# Patient Record
Sex: Male | Born: 1940 | Race: White | Hispanic: No | Marital: Married | Smoking: Never smoker
Health system: Southern US, Community
[De-identification: ages and names within clinical notes are randomized; demographics above are authoritative.]

## PROBLEM LIST (undated history)

## (undated) DIAGNOSIS — I480 Paroxysmal atrial fibrillation: Secondary | ICD-10-CM

## (undated) DIAGNOSIS — K219 Gastro-esophageal reflux disease without esophagitis: Secondary | ICD-10-CM

## (undated) DIAGNOSIS — C44300 Unspecified malignant neoplasm of skin of unspecified part of face: Secondary | ICD-10-CM

## (undated) DIAGNOSIS — M1990 Unspecified osteoarthritis, unspecified site: Secondary | ICD-10-CM

## (undated) DIAGNOSIS — F419 Anxiety disorder, unspecified: Secondary | ICD-10-CM

## (undated) DIAGNOSIS — R296 Repeated falls: Secondary | ICD-10-CM

## (undated) DIAGNOSIS — K259 Gastric ulcer, unspecified as acute or chronic, without hemorrhage or perforation: Secondary | ICD-10-CM

## (undated) DIAGNOSIS — Z8619 Personal history of other infectious and parasitic diseases: Secondary | ICD-10-CM

## (undated) DIAGNOSIS — Z7901 Long term (current) use of anticoagulants: Secondary | ICD-10-CM

## (undated) DIAGNOSIS — I10 Essential (primary) hypertension: Secondary | ICD-10-CM

## (undated) DIAGNOSIS — I4510 Unspecified right bundle-branch block: Secondary | ICD-10-CM

## (undated) DIAGNOSIS — K759 Inflammatory liver disease, unspecified: Secondary | ICD-10-CM

## (undated) DIAGNOSIS — E785 Hyperlipidemia, unspecified: Secondary | ICD-10-CM

## (undated) DIAGNOSIS — N6009 Solitary cyst of unspecified breast: Secondary | ICD-10-CM

## (undated) DIAGNOSIS — I700 Atherosclerosis of aorta: Secondary | ICD-10-CM

## (undated) DIAGNOSIS — M1611 Unilateral primary osteoarthritis, right hip: Secondary | ICD-10-CM

## (undated) DIAGNOSIS — R001 Bradycardia, unspecified: Secondary | ICD-10-CM

## (undated) DIAGNOSIS — I495 Sick sinus syndrome: Secondary | ICD-10-CM

## (undated) HISTORY — PX: TONSILLECTOMY: SUR1361

## (undated) HISTORY — PX: COLON SURGERY: SHX602

---
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
## 1979-03-29 DIAGNOSIS — N6001 Solitary cyst of right breast: Secondary | ICD-10-CM

## 1979-03-29 DIAGNOSIS — N6009 Solitary cyst of unspecified breast: Secondary | ICD-10-CM

## 1979-03-29 HISTORY — DX: Solitary cyst of right breast: N60.01

## 1979-03-29 HISTORY — PX: BREAST EXCISIONAL BIOPSY: SUR124

## 1979-03-29 HISTORY — DX: Solitary cyst of unspecified breast: N60.09

## 1986-03-28 DIAGNOSIS — K759 Inflammatory liver disease, unspecified: Secondary | ICD-10-CM

## 1986-03-28 HISTORY — DX: Inflammatory liver disease, unspecified: K75.9

## 2014-02-12 ENCOUNTER — Ambulatory Visit: Payer: Self-pay | Admitting: Orthopedic Surgery

## 2014-02-12 LAB — BASIC METABOLIC PANEL
Anion Gap: 6 — ABNORMAL LOW (ref 7–16)
BUN: 19 mg/dL — ABNORMAL HIGH (ref 7–18)
CHLORIDE: 100 mmol/L (ref 98–107)
CREATININE: 0.92 mg/dL (ref 0.60–1.30)
Calcium, Total: 9.3 mg/dL (ref 8.5–10.1)
Co2: 34 mmol/L — ABNORMAL HIGH (ref 21–32)
EGFR (Non-African Amer.): >60
GLUCOSE: 137 mg/dL — AB (ref 65–99)
Osmolality: 284 (ref 275–301)
POTASSIUM: 3.8 mmol/L (ref 3.5–5.1)
Sodium: 140 mmol/L (ref 136–145)

## 2014-02-12 LAB — CBC
HCT: 47.1 % (ref 40.0–52.0)
HGB: 15.7 g/dL (ref 13.0–18.0)
MCH: 32.5 pg (ref 26.0–34.0)
MCHC: 33.3 g/dL (ref 32.0–36.0)
MCV: 97 fL (ref 80–100)
Platelet: 229 10*3/uL (ref 150–440)
RBC: 4.84 10*6/uL (ref 4.40–5.90)
RDW: 12.8 % (ref 11.5–14.5)
WBC: 9 10*3/uL (ref 3.8–10.6)

## 2014-02-12 LAB — URINALYSIS, COMPLETE
Bilirubin,UR: NEGATIVE
Blood: NEGATIVE
GLUCOSE, UR: NEGATIVE mg/dL (ref 0–75)
Ketone: NEGATIVE
LEUKOCYTE ESTERASE: NEGATIVE
NITRITE: NEGATIVE
PH: 6 (ref 4.5–8.0)
Protein: 30
RBC,UR: 2 /HPF (ref 0–5)
SPECIFIC GRAVITY: 1.018 (ref 1.003–1.030)

## 2014-02-12 LAB — PROTIME-INR
INR: 1
PROTHROMBIN TIME: 13.2 s (ref 11.5–14.7)

## 2014-02-12 LAB — APTT: ACTIVATED PTT: 29.4 s (ref 23.6–35.9)

## 2014-02-12 LAB — MRSA PCR SCREENING

## 2014-02-12 LAB — SEDIMENTATION RATE: Erythrocyte Sed Rate: 6 mm/hr (ref 0–20)

## 2014-02-27 ENCOUNTER — Inpatient Hospital Stay: Payer: Self-pay | Admitting: Orthopedic Surgery

## 2014-02-27 HISTORY — PX: TOTAL HIP ARTHROPLASTY: SHX124

## 2014-02-27 IMAGING — CR DG HIP COMPLETE 2+V*L*
1 series · 1 of 1 positions shown · non-contrast
Comparison: None.

CLINICAL DATA: Status post left hip arthroplasty.

EXAM:
LEFT HIP - COMPLETE 2+ VIEW

[ap]
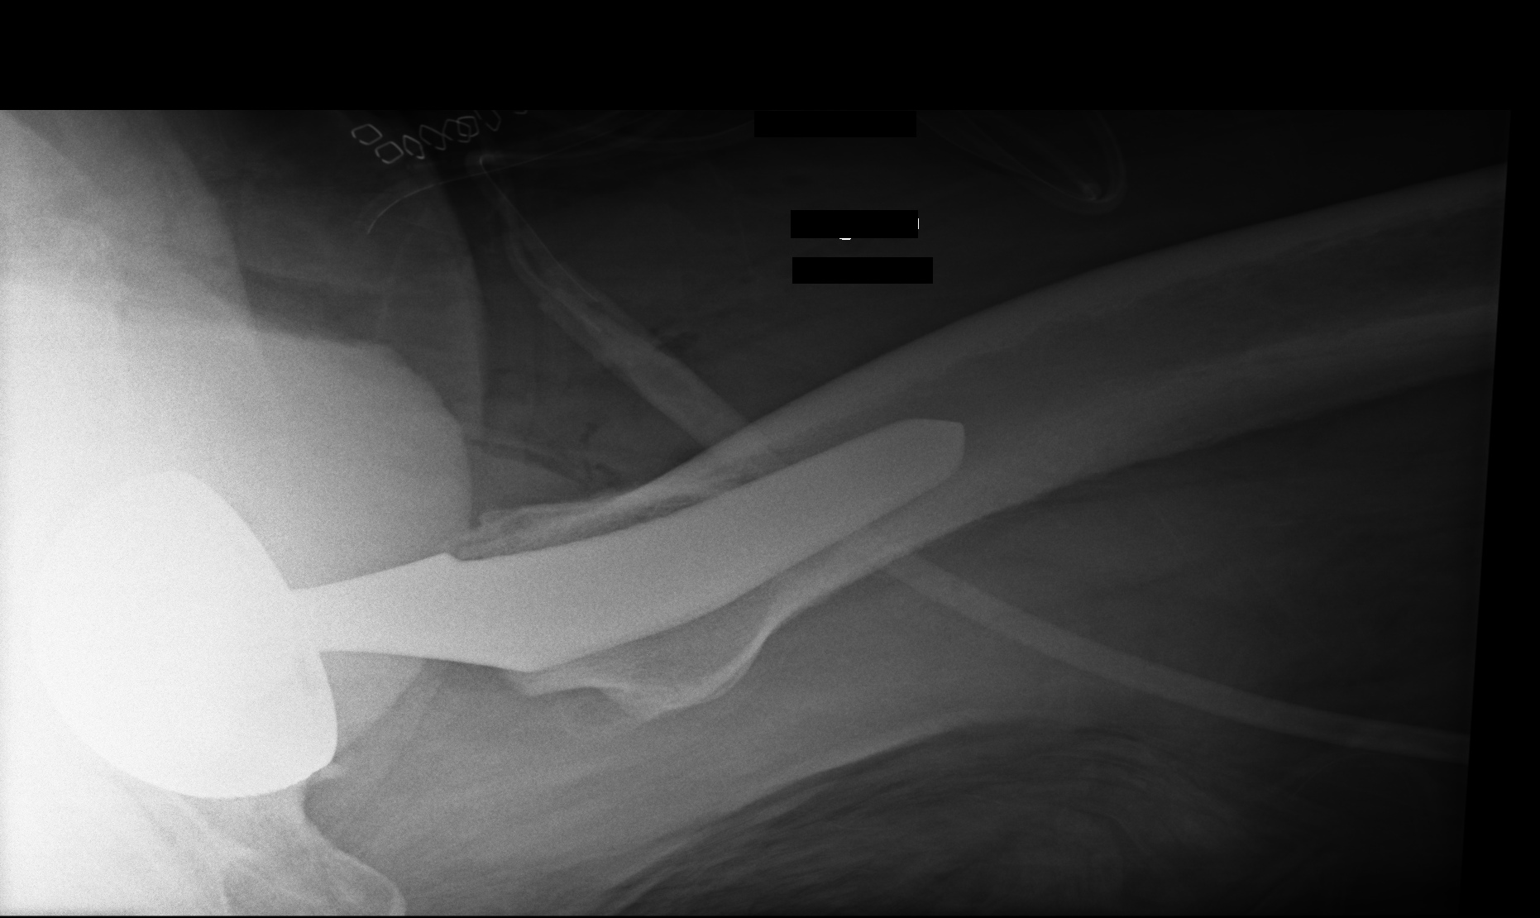

[1 of 1 positions shown; findings below may reference images not displayed]

FINDINGS: Left hip prosthesis is seen in appropriate position. No evidence of
fracture or dislocation. Overlying surgical drain and skin staples
noted.
IMPRESSION: Expected appearance of left prosthesis. No evidence of fracture or
dislocation.

## 2014-02-28 LAB — BASIC METABOLIC PANEL
Anion Gap: 7 (ref 7–16)
BUN: 15 mg/dL (ref 7–18)
CALCIUM: 7.8 mg/dL — AB (ref 8.5–10.1)
CHLORIDE: 101 mmol/L (ref 98–107)
Co2: 26 mmol/L (ref 21–32)
Creatinine: 1 mg/dL (ref 0.60–1.30)
EGFR (African American): >60
EGFR (Non-African Amer.): >60
Glucose: 137 mg/dL — ABNORMAL HIGH (ref 65–99)
Osmolality: 271 (ref 275–301)
Potassium: 4.1 mmol/L (ref 3.5–5.1)
Sodium: 134 mmol/L — ABNORMAL LOW (ref 136–145)

## 2014-02-28 LAB — HEMOGLOBIN: HGB: 12.5 g/dL — ABNORMAL LOW (ref 13.0–18.0)

## 2014-02-28 LAB — PLATELET COUNT: PLATELETS: 182 10*3/uL (ref 150–440)

## 2014-07-19 NOTE — Op Note (Signed)
PATIENT NAME:  Steven Welch, Steven Welch MR#:  [id number] DATE OF BIRTH:  [birth year]  DATE OF PROCEDURE:  02/27/2014  PREOPERATIVE DIAGNOSIS: Severe left hip osteoarthritis.   POSTOPERATIVE DIAGNOSIS: Severe left hip osteoarthritis.   PROCEDURE: Left total hip replacement, direct anterior approach.   ANESTHESIA: Spinal.   SURGEON: Laurene Footman, MD.  DESCRIPTION OF PROCEDURE: The patient was brought to the Operating Room and after spinal anesthesia was obtained, the patient was placed on the operative table with the right leg on a well-padded table, left leg on the left foot in the Medacta foot  attachment. After prepping and draping in the usual sterile fashion and the C-arm being brought in to get an initial template x-ray, the appropriate timeout and patient identification procedures completed, a direct anterior approach was made centered over the greater trochanter and tensor fascia lata muscle.  After incising down through the skin and subcutaneous tissue, the tensor fascia was incised and the muscle belly retracted laterally. The deep fascia was incised and the lateral femoral circumflex vessels ligated. The anterior capsule was exposed and then an anterior capsulotomy was performed with retractors placed. The femoral neck cut was carried out and the head was noted to be quite deformed with a large spur medially, as well as sclerotic bone on both the femoral head and extensive sclerosis of the acetabulum.   Next, the acetabulum was sequentially reamed with quite large head, 60 mm reaming.  Good bleeding bone was obtained at 60 mm and the trial fit well; 60 mm Versafitcup DM was impacted into place. Next, the leg was externally rotated and dropped into extension after partial ischiofemoral and pubofemoral releases. Sequential broaching was carried up to a size 5 . The size 5 gave good fill of the canal and appeared appropriate. Next, trial was performed with the trial neck and head with the appropriate size  being obtained. The final components were inserted with a size 5 stem, an L28 head and the appropriate liner for the 60 mm cup. The hip was reduced and was stable through a range of motion.  Soft tissue tension appeared appropriate and it was very stable to external rotation, stability testing.  The hip was thoroughly irrigated. Then, 30 mL of 0.25% Sensorcaine with epinephrine infiltrated in the periarticular tissue to aid in postoperative analgesia. The deep fascia was repaired using a heavy quill running suture. A subcutaneous drain placed with 2-0 quill subcutaneously and skin staples. Xeroform, 4 x 4's, ABD and tape applied. The patient was sent to the recovery room in stable condition.   ESTIMATED BLOOD LOSS: 400 mL.   COMPLICATIONS: None.   SPECIMEN: Removed femoral head.   IMPLANTS:  Medacta AMIS size 5 stem with a 60 mm Versafitcup cup DM with liner and L28 mm head.    ____________________________ Laurene Footman, MD mjm:by D: 02/27/2014 12:48:45 ET T: 02/27/2014 14:10:59 ET JOB#: [id number]  cc: Laurene Footman, MD, <Dictator> Laurene Footman MD ELECTRONICALLY SIGNED 02/27/2014 17:09

## 2014-07-19 NOTE — Discharge Summary (Signed)
PATIENT NAME:  Steven Welch, Steven Welch MR#:  [id number] DATE OF BIRTH:  [birth year]  DATE OF ADMISSION:  02/27/2014 DATE OF DISCHARGE:  03/02/2014  ADMITTING DIAGNOSIS: Severe left hip osteoarthritis.   DISCHARGE DIAGNOSIS:  Severe left hip osteoarthritis.   OPERATION: On 02/27/2014, the patient had a left total hip replacement with an anterior approach.   ANESTHESIA: Spinal.   SURGEON: Laurene Footman, MD  ESTIMATED BLOOD LOSS: 400 mL.   COMPLICATIONS: None.   IMPLANTS USED: Medacta AMIS size 5 stem with a 60 mm Versafit cup with DM and a liner with L 28 mm head. The patient was stabilized, brought to the recovery room, and then brought down to the orthopedic floor.   HISTORY AND PHYSICAL: The patient is a 74 year old male, who presented to the clinic with persistent left hip pain. The patient has been refractory to conservative treatment with activity modification and anti-inflammatories. The patient has been using a cane.   PHYSICAL EXAMINATION:  GENERAL: Alert male with severe limp and slow gait. The patient has range of motion that is limited with hip flexion, as well as internal and external rotation secondary to pain. The patient has tenderness in the anterior aspect of the hip joint with good quadriceps control.   HOSPITAL COURSE: After initial admission on February 27, 2014, the patient was brought to the orthopedic floor.  On postoperative day 1, the patient had a hemoglobin of 12.5 and remained stable there. The patient worked with physical therapy initially bed to chair and progressed up to ambulating about 250 feet, including doing stairs. The patient was ready to go home with home health physical therapy on March 02, 2014.   CONDITION AT DISCHARGE: Stable.   DISPOSITION: The patient was sent home with home health physical therapy.   DISCHARGE INSTRUCTIONS:  The patient will follow up with [facility] orthopedics in 2 weeks for staple removal. The patient is to weight bear as  tolerated. The patient will elevate his leg with 1 to 2 pillows, use knee-high TED hose on both legs, removed at bedtime. The patient will elevate his heels off the bed and use incentive spirometer and be encouraged to do cough and deep breathing. The patient will use a regular diet. The patient will keep his dressing on and have it changed with physical therapy and try not to get it wet or dirty. The patient will call the clinic if there is any bright red bleeding, calf pain, bowel or bladder difficulty, or any fever greater than 101.5. The patient will do physical therapy working on gait training and strengthening.   DISCHARGE MEDICATIONS: Resume home medications and then to begin oxycodone 5 mg 1 tablet every 4 hours as needed for severe pain, Tylenol Extra Strength 2 tablets every 6 hours as needed for pain or fever, and the Lovenox 40 mg subcutaneous once a day for 14 days and discontinue after 14 days and begin aspirin 81 mg once a day.     ____________________________ Lenna Sciara. Reche Dixon, [place] jtm:ap D: 03/02/2014 07:16:43 ET T: 03/02/2014 08:04:52 ET JOB#: [id number]  cc: J. Reche Dixon, [place], <Dictator> J Larcenia Holaday [facility] ELECTRONICALLY SIGNED 03/04/2014 9:58

## 2014-07-21 LAB — SURGICAL PATHOLOGY

## 2015-06-17 ENCOUNTER — Other Ambulatory Visit: Payer: Self-pay | Admitting: Family Medicine

## 2015-06-17 DIAGNOSIS — N6459 Other signs and symptoms in breast: Secondary | ICD-10-CM

## 2015-06-17 DIAGNOSIS — N649 Disorder of breast, unspecified: Secondary | ICD-10-CM

## 2015-07-03 ENCOUNTER — Ambulatory Visit
Admission: RE | Admit: 2015-07-03 | Discharge: 2015-07-03 | Disposition: A | Discharge status: Home / Self Care | Payer: Medicare Other | Source: Ambulatory Visit | Attending: Family Medicine | Admitting: Family Medicine

## 2015-07-03 DIAGNOSIS — N62 Hypertrophy of breast: Secondary | ICD-10-CM | POA: Insufficient documentation

## 2015-07-03 DIAGNOSIS — N649 Disorder of breast, unspecified: Secondary | ICD-10-CM | POA: Diagnosis present

## 2015-07-03 DIAGNOSIS — N6459 Other signs and symptoms in breast: Secondary | ICD-10-CM

## 2016-12-11 ENCOUNTER — Encounter: Payer: Self-pay | Admitting: *Deleted

## 2016-12-11 DIAGNOSIS — Z23 Encounter for immunization: Secondary | ICD-10-CM

## 2018-01-02 ENCOUNTER — Encounter: Payer: Self-pay | Admitting: *Deleted

## 2018-12-24 ENCOUNTER — Other Ambulatory Visit: Payer: Self-pay

## 2018-12-24 ENCOUNTER — Encounter
Admission: RE | Admit: 2018-12-24 | Discharge: 2018-12-24 | Disposition: A | Discharge status: Home / Self Care | Payer: Medicare Other | Source: Ambulatory Visit | Attending: Orthopedic Surgery | Admitting: Orthopedic Surgery

## 2018-12-24 DIAGNOSIS — I10 Essential (primary) hypertension: Secondary | ICD-10-CM | POA: Diagnosis not present

## 2018-12-24 DIAGNOSIS — Z01818 Encounter for other preprocedural examination: Secondary | ICD-10-CM | POA: Diagnosis not present

## 2018-12-24 DIAGNOSIS — I4510 Unspecified right bundle-branch block: Secondary | ICD-10-CM | POA: Insufficient documentation

## 2018-12-24 DIAGNOSIS — R9431 Abnormal electrocardiogram [ECG] [EKG]: Secondary | ICD-10-CM | POA: Diagnosis not present

## 2018-12-24 HISTORY — DX: Gastro-esophageal reflux disease without esophagitis: K21.9

## 2018-12-24 HISTORY — DX: Essential (primary) hypertension: I10

## 2018-12-24 HISTORY — DX: Unilateral primary osteoarthritis, right hip: M16.11

## 2018-12-24 HISTORY — DX: Personal history of other infectious and parasitic diseases: Z86.19

## 2018-12-24 HISTORY — DX: Unspecified malignant neoplasm of skin of unspecified part of face: C44.300

## 2018-12-24 HISTORY — DX: Hyperlipidemia, unspecified: E78.5

## 2018-12-24 LAB — TYPE AND SCREEN
ABO/RH(D): O POS
Antibody Screen: NEGATIVE

## 2018-12-24 LAB — SURGICAL PCR SCREEN
MRSA, PCR: NEGATIVE
Staphylococcus aureus: NEGATIVE

## 2018-12-24 NOTE — Patient Instructions (Signed)
Your procedure is scheduled on: Tuesday, January 01, 2019 Report to Day Surgery on the 2nd floor of the [facility]. To find out your arrival time, please call [phone] between 1PM - 3PM on: Monday, December 31, 2018  REMEMBER: Instructions that are not followed completely may result in serious medical risk, up to and including death; or upon the discretion of your surgeon and anesthesiologist your surgery may need to be rescheduled.  Do not eat food after midnight the night before surgery.  No gum chewing, lozengers or hard candies.  You may however, drink CLEAR liquids up to 2 hours before you are scheduled to arrive for your surgery. Do not drink anything within 2 hours of the start of your surgery.  Clear liquids include: - water  - apple juice without pulp - gatorade - black coffee or tea (Do NOT add milk or creamers to the coffee or tea) Do NOT drink anything that is not on this list.  No Alcohol for 24 hours before or after surgery.  No Smoking including e-cigarettes for 24 hours prior to surgery.   On the morning of surgery brush your teeth with toothpaste and water, you may rinse your mouth with mouthwash if you wish. Do not swallow any toothpaste or mouthwash.  Notify your doctor if there is any change in your medical condition (cold, fever, infection).  Do not wear jewelry, make-up, hairpins, clips or nail polish.  Do not wear lotions, powders, or perfumes.   Do not shave 48 hours prior to surgery.   Contacts and dentures may not be worn into surgery.  Do not bring valuables to the hospital, including drivers license, insurance or credit cards.  [facility] is not responsible for any belongings or valuables.   TAKE THESE MEDICATIONS THE MORNING OF SURGERY:  1.  Tylenol (if needed for pain) 2.  Amlodipine 3.  Prilosec - (take one the night before and one on the morning of surgery - helps to prevent nausea after surgery.)  Use CHG Soap as directed on  instruction sheet.  Starting September 29 - Stop ASPIRIN AND Anti-inflammatories (NSAIDS) such as Advil, Aleve, Ibuprofen, Motrin, Naproxen, Naprosyn and Aspirin based products such as Excedrin, Goodys Powder, BC Powder. (May take Tylenol or Acetaminophen if needed.)  Starting September 29 - Stop ANY OVER THE COUNTER supplements until after surgery.  Wear comfortable clothing (specific to your surgery type) to the hospital.  Please call [phone] if you have any questions about these instructions.

## 2018-12-24 NOTE — Progress Notes (Signed)
EKG reviewed by Dr. Rosey Bath (anesthesia). Patient having no cardiac symptoms. Ok to proceed to surgery.

## 2018-12-28 ENCOUNTER — Other Ambulatory Visit: Payer: Self-pay

## 2018-12-28 ENCOUNTER — Other Ambulatory Visit
Admission: RE | Admit: 2018-12-28 | Discharge: 2018-12-28 | Disposition: A | Discharge status: Home / Self Care | Payer: Medicare Other | Source: Ambulatory Visit | Attending: Orthopedic Surgery | Admitting: Orthopedic Surgery

## 2018-12-28 DIAGNOSIS — Z20828 Contact with and (suspected) exposure to other viral communicable diseases: Secondary | ICD-10-CM | POA: Diagnosis not present

## 2018-12-28 DIAGNOSIS — M1611 Unilateral primary osteoarthritis, right hip: Secondary | ICD-10-CM | POA: Insufficient documentation

## 2018-12-28 DIAGNOSIS — Z01812 Encounter for preprocedural laboratory examination: Secondary | ICD-10-CM | POA: Insufficient documentation

## 2018-12-28 LAB — SARS CORONAVIRUS 2 (TAT 6-24 HRS): SARS Coronavirus 2: NEGATIVE

## 2019-01-01 ENCOUNTER — Encounter
Admission: RE | Disposition: A | Discharge status: Home Health | Payer: Self-pay | Source: Home / Self Care | Attending: Orthopedic Surgery

## 2019-01-01 ENCOUNTER — Other Ambulatory Visit: Payer: Self-pay

## 2019-01-01 ENCOUNTER — Inpatient Hospital Stay: Payer: Medicare Other | Admitting: Anesthesiology

## 2019-01-01 ENCOUNTER — Inpatient Hospital Stay: Payer: Medicare Other

## 2019-01-01 ENCOUNTER — Inpatient Hospital Stay
Admission: RE | Admit: 2019-01-01 | Discharge: 2019-01-04 | DRG: 470 | Disposition: A | Discharge status: Home Health | Payer: Medicare Other | Attending: Orthopedic Surgery | Admitting: Orthopedic Surgery

## 2019-01-01 ENCOUNTER — Encounter: Payer: Self-pay | Admitting: *Deleted

## 2019-01-01 DIAGNOSIS — E785 Hyperlipidemia, unspecified: Secondary | ICD-10-CM | POA: Diagnosis present

## 2019-01-01 DIAGNOSIS — Z85828 Personal history of other malignant neoplasm of skin: Secondary | ICD-10-CM

## 2019-01-01 DIAGNOSIS — I10 Essential (primary) hypertension: Secondary | ICD-10-CM | POA: Diagnosis present

## 2019-01-01 DIAGNOSIS — Z8711 Personal history of peptic ulcer disease: Secondary | ICD-10-CM

## 2019-01-01 DIAGNOSIS — M1611 Unilateral primary osteoarthritis, right hip: Principal | ICD-10-CM | POA: Diagnosis present

## 2019-01-01 DIAGNOSIS — Z8249 Family history of ischemic heart disease and other diseases of the circulatory system: Secondary | ICD-10-CM

## 2019-01-01 DIAGNOSIS — K219 Gastro-esophageal reflux disease without esophagitis: Secondary | ICD-10-CM | POA: Diagnosis present

## 2019-01-01 DIAGNOSIS — Z8619 Personal history of other infectious and parasitic diseases: Secondary | ICD-10-CM | POA: Diagnosis not present

## 2019-01-01 DIAGNOSIS — Z79899 Other long term (current) drug therapy: Secondary | ICD-10-CM

## 2019-01-01 DIAGNOSIS — Z96641 Presence of right artificial hip joint: Secondary | ICD-10-CM

## 2019-01-01 DIAGNOSIS — Z791 Long term (current) use of non-steroidal anti-inflammatories (NSAID): Secondary | ICD-10-CM

## 2019-01-01 DIAGNOSIS — Z7951 Long term (current) use of inhaled steroids: Secondary | ICD-10-CM

## 2019-01-01 DIAGNOSIS — G8918 Other acute postprocedural pain: Secondary | ICD-10-CM

## 2019-01-01 DIAGNOSIS — Z419 Encounter for procedure for purposes other than remedying health state, unspecified: Secondary | ICD-10-CM

## 2019-01-01 HISTORY — PX: TOTAL HIP ARTHROPLASTY: SHX124

## 2019-01-01 LAB — ABO/RH: ABO/RH(D): O POS

## 2019-01-01 LAB — CBC
HCT: 38 % — ABNORMAL LOW (ref 39.0–52.0)
Hemoglobin: 12.9 g/dL — ABNORMAL LOW (ref 13.0–17.0)
MCH: 31.9 pg (ref 26.0–34.0)
MCHC: 33.9 g/dL (ref 30.0–36.0)
MCV: 93.8 fL (ref 80.0–100.0)
Platelets: 234 10*3/uL (ref 150–400)
RBC: 4.05 MIL/uL — ABNORMAL LOW (ref 4.22–5.81)
RDW: 12.3 % (ref 11.5–15.5)
WBC: 17.8 10*3/uL — ABNORMAL HIGH (ref 4.0–10.5)
nRBC: 0 % (ref 0.0–0.2)

## 2019-01-01 LAB — CREATININE, SERUM
Creatinine, Ser: 0.77 mg/dL (ref 0.61–1.24)
GFR calc Af Amer: >60 mL/min (ref >60)
GFR calc non Af Amer: >60 mL/min (ref >60)

## 2019-01-01 IMAGING — XA DG HIP (WITH PELVIS) OPERATIVE*R*
3 series · 3 of 3 positions shown · non-contrast
Comparison: None.

CLINICAL DATA: Post right hip replacement.

EXAM:
DG HIP (WITH OR WITHOUT PELVIS) 2-3V RIGHT; OPERATIVE RIGHT HIP WITH
PELVIS

[Series 1: ortho standard · 1 of 1 slices shown (1 of 3)]
[im 1/1]
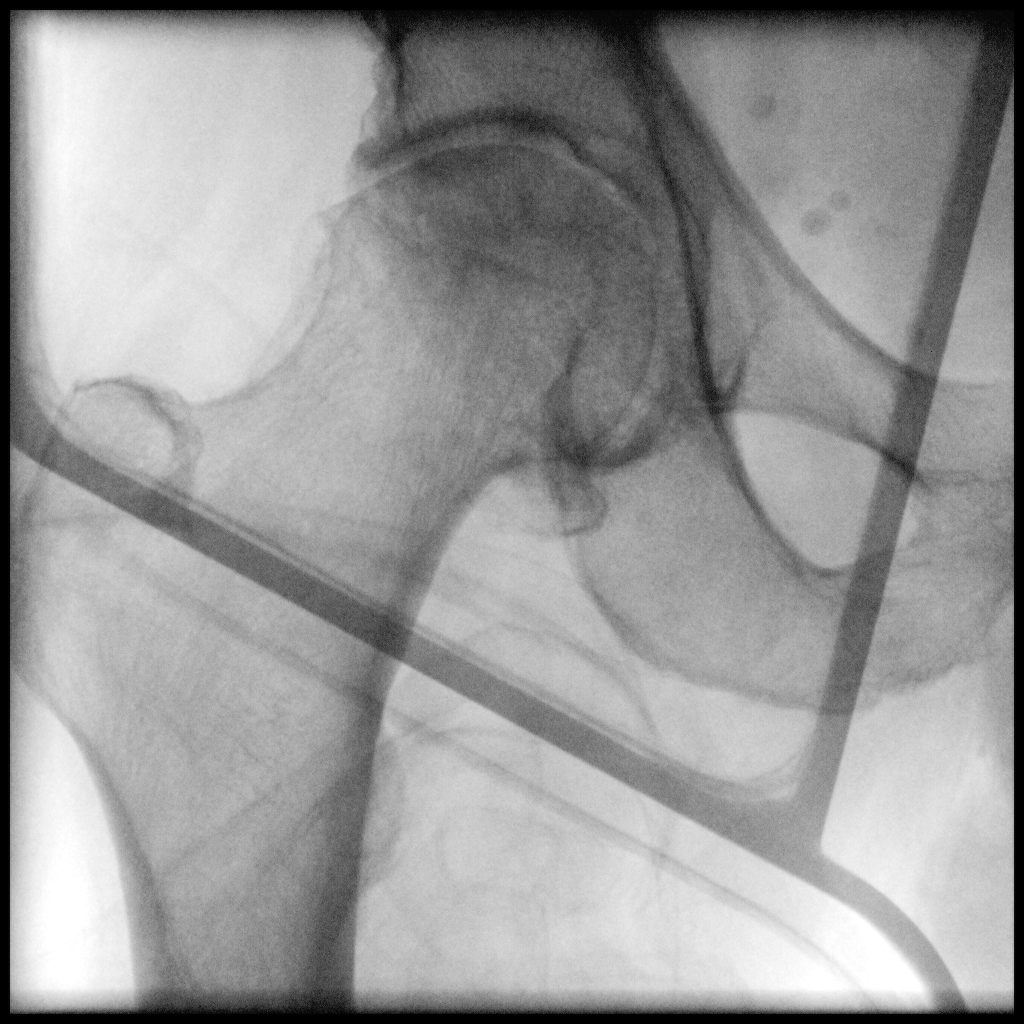

[Series 3: ortho standard · 1 of 1 slices shown (2 of 3)]
[im 1/1]
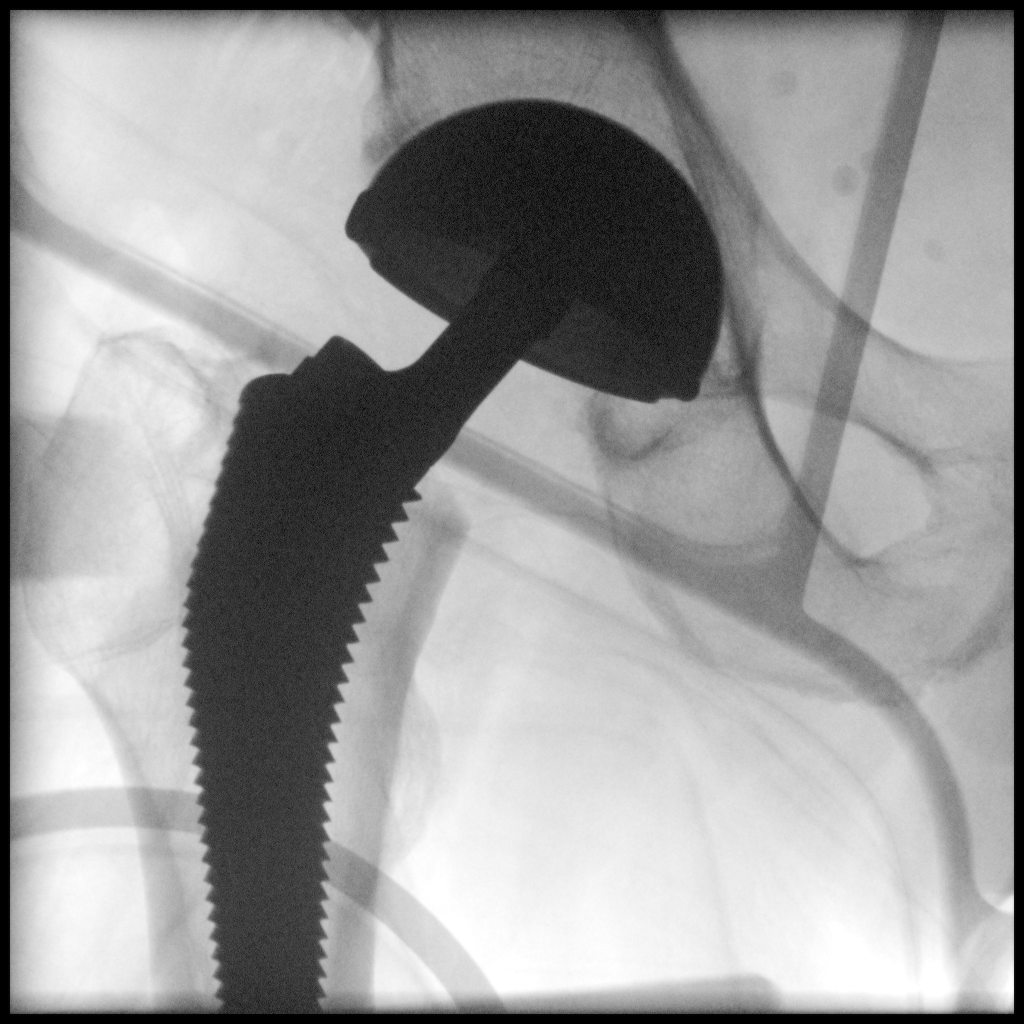

[Series 5: ortho standard · 1 of 1 slices shown (3 of 3)]
[im 1/1]
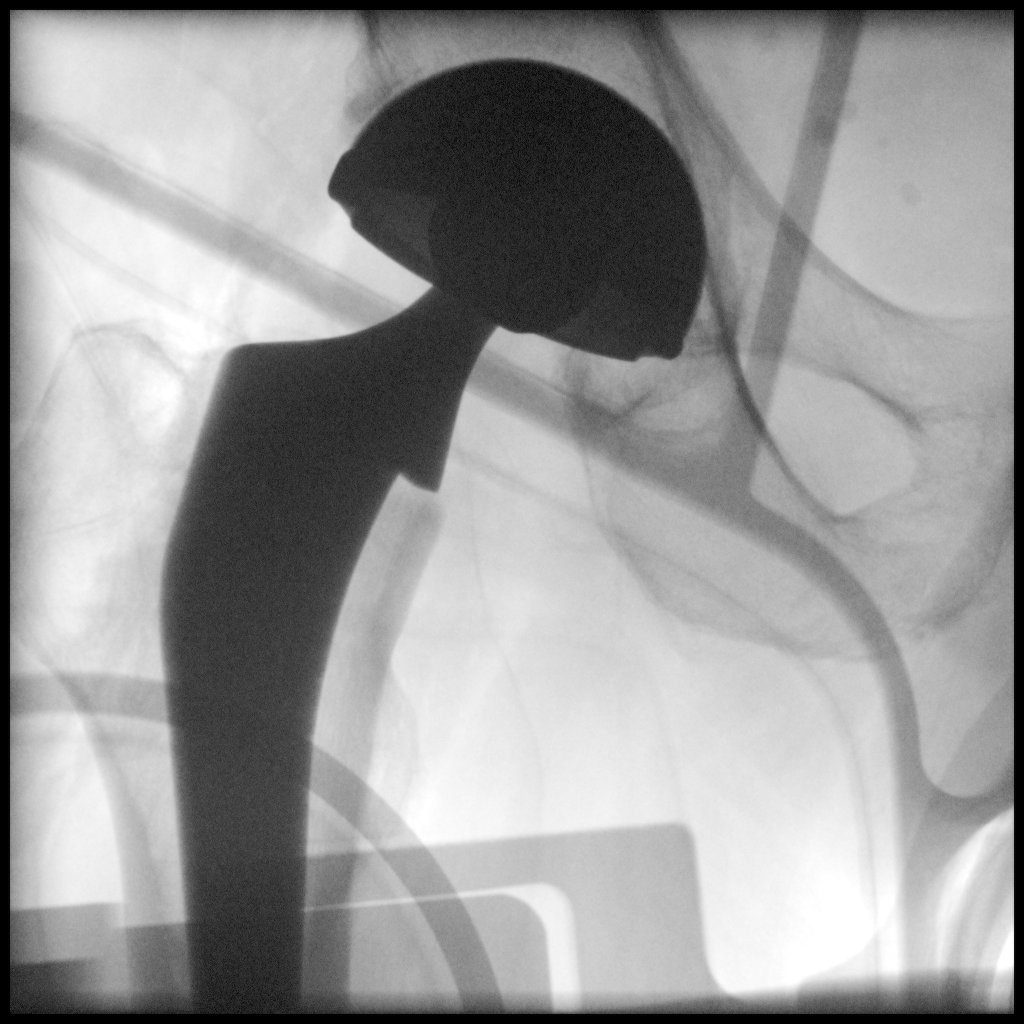

[3 of 3 positions shown; findings below may reference images not displayed]

FINDINGS: Initial intraoperative images demonstrate degenerative changes of
the right hip. Subsequent placement of right total hip arthroplasty
intact and normally located.

Subsequent postoperative images demonstrate evidence of recent right
total hip arthroplasty in adequate position and intact. Skin staples
over the associated soft tissues.
IMPRESSION: Expected changes post right total hip arthroplasty.

## 2019-01-01 IMAGING — DX DG HIP (WITH OR WITHOUT PELVIS) 2-3V*R*
2 series · 2 of 2 positions shown · non-contrast
Comparison: None.

CLINICAL DATA: Post right hip replacement.

EXAM:
DG HIP (WITH OR WITHOUT PELVIS) 2-3V RIGHT; OPERATIVE RIGHT HIP WITH
PELVIS

[hip ap]
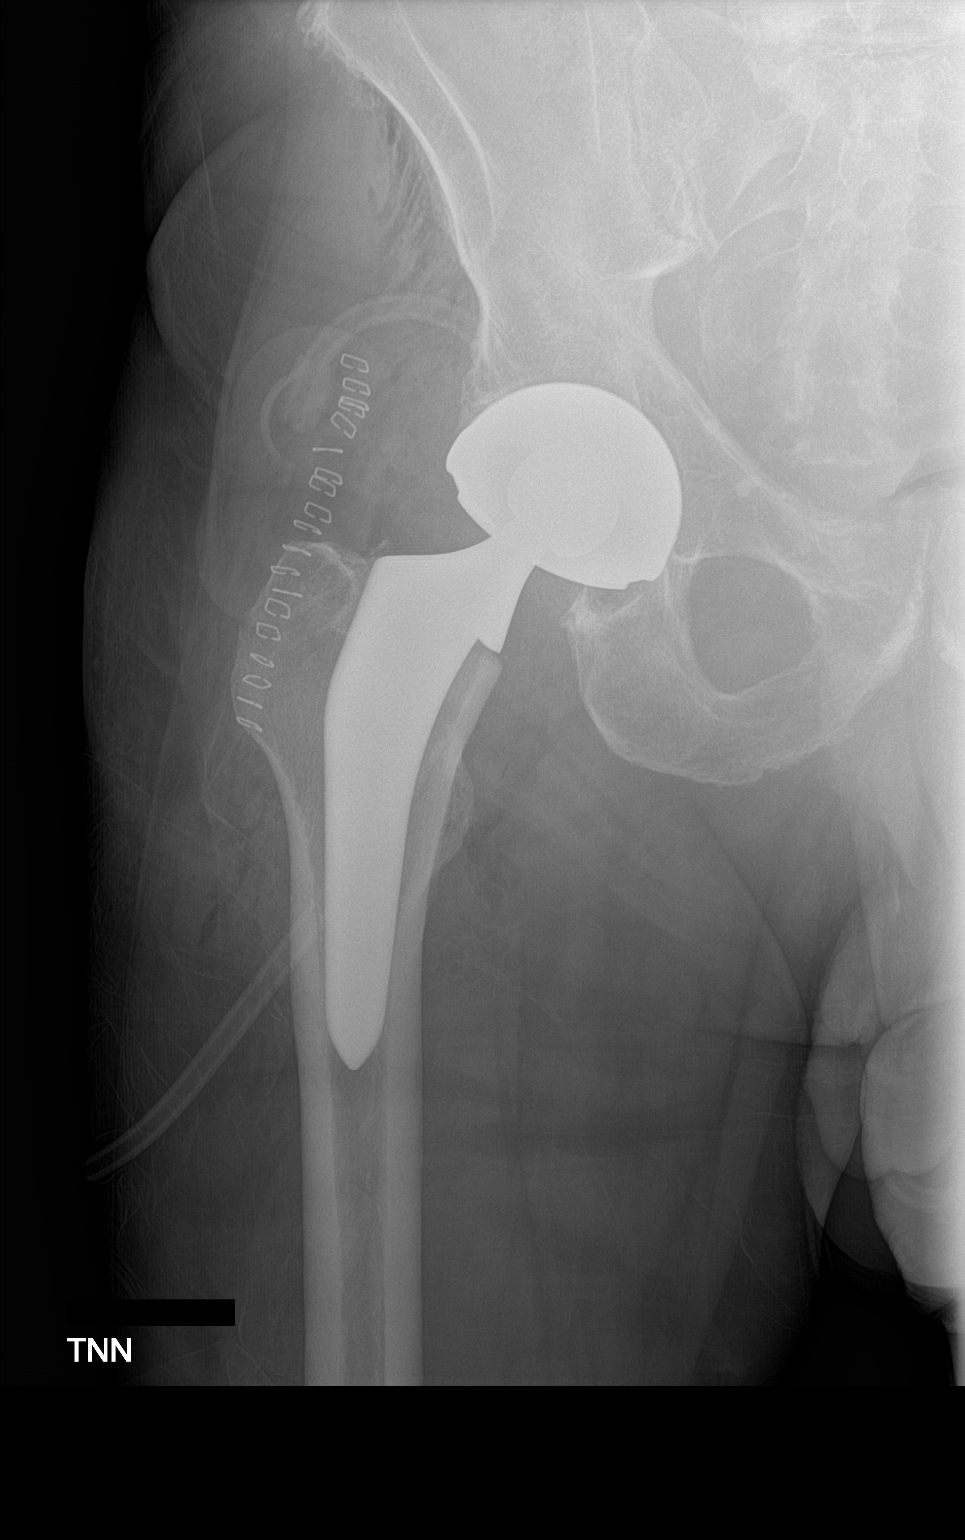

[hip lat]
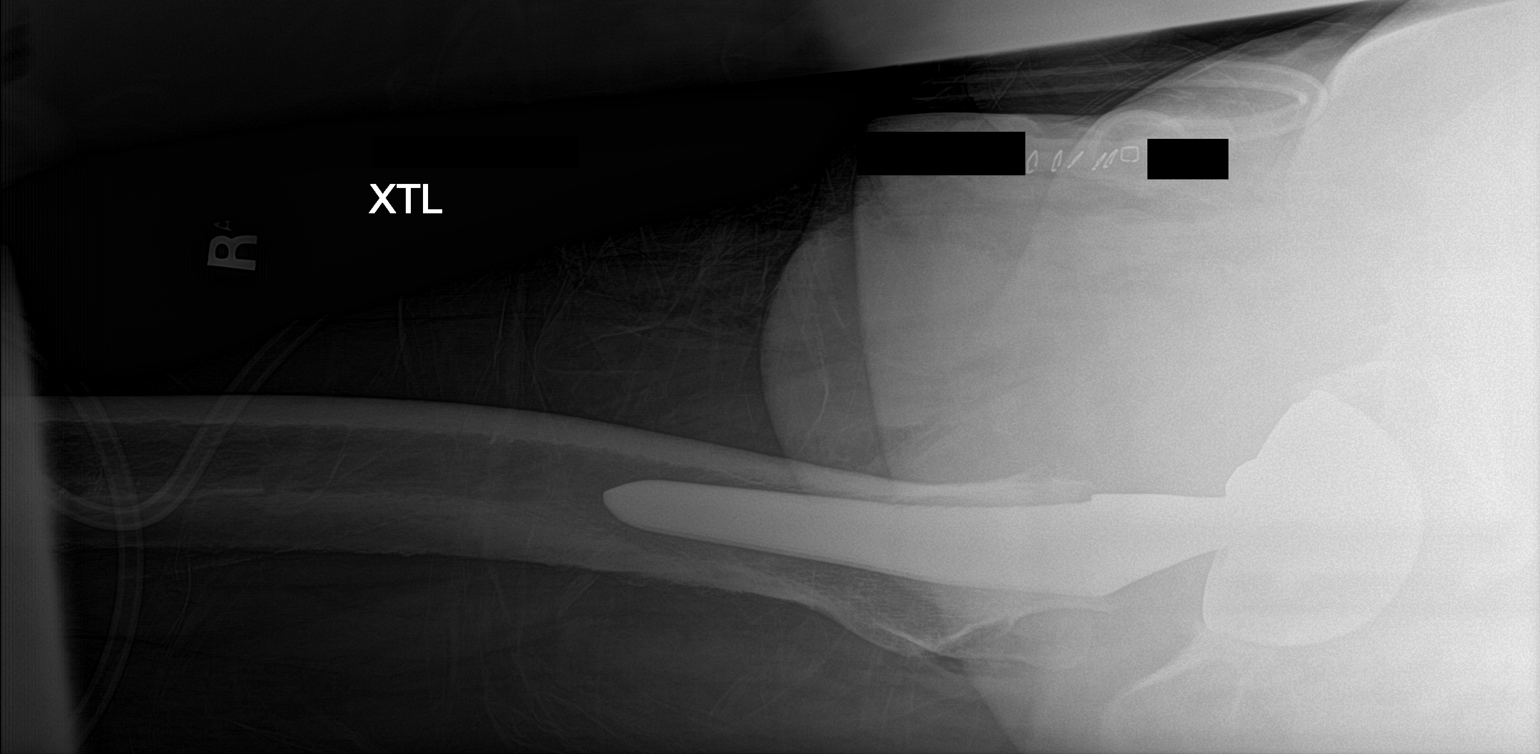

[2 of 2 positions shown; findings below may reference images not displayed]

FINDINGS: Initial intraoperative images demonstrate degenerative changes of
the right hip. Subsequent placement of right total hip arthroplasty
intact and normally located.

Subsequent postoperative images demonstrate evidence of recent right
total hip arthroplasty in adequate position and intact. Skin staples
over the associated soft tissues.
IMPRESSION: Expected changes post right total hip arthroplasty.

## 2019-01-01 SURGERY — ARTHROPLASTY, HIP, TOTAL, ANTERIOR APPROACH
Anesthesia: Spinal | Site: Hip | Laterality: Right

## 2019-01-01 MED ORDER — METOCLOPRAMIDE HCL 10 MG PO TABS: 5.0000 mg | ORAL_TABLET | Freq: Three times a day (TID) | ORAL | Status: DC | PRN

## 2019-01-01 MED ORDER — DOCUSATE SODIUM 100 MG PO CAPS
100.0000 mg | ORAL_CAPSULE | Freq: Two times a day (BID) | ORAL | Status: DC
  Administered 2019-01-01 – 2019-01-04 (×5): 100 mg via ORAL
  Filled 2019-01-01 (×6): qty 1

## 2019-01-01 MED ORDER — ACETAMINOPHEN 325 MG PO TABS: 325.0000 mg | ORAL_TABLET | Freq: Four times a day (QID) | ORAL | Status: DC | PRN

## 2019-01-01 MED ORDER — FENTANYL CITRATE (PF) 100 MCG/2ML IJ SOLN
INTRAMUSCULAR | Status: DC | PRN
  Administered 2019-01-01 (×2): 50 ug via INTRAVENOUS

## 2019-01-01 MED ORDER — TRAMADOL HCL 50 MG PO TABS
50.0000 mg | ORAL_TABLET | Freq: Four times a day (QID) | ORAL | Status: DC
  Administered 2019-01-01 – 2019-01-04 (×12): 50 mg via ORAL
  Filled 2019-01-01 (×13): qty 1

## 2019-01-01 MED ORDER — OXYMETAZOLINE HCL 0.05 % NA SOLN
1.0000 | Freq: Every day | NASAL | Status: DC
  Administered 2019-01-01 – 2019-01-03 (×3): 1 via NASAL
  Filled 2019-01-01: qty 15

## 2019-01-01 MED ORDER — FENTANYL CITRATE (PF) 100 MCG/2ML IJ SOLN
INTRAMUSCULAR | Status: AC
  Filled 2019-01-01: qty 2

## 2019-01-01 MED ORDER — FAMOTIDINE 20 MG PO TABS
ORAL_TABLET | ORAL | Status: AC
  Administered 2019-01-01: 20 mg via ORAL
  Filled 2019-01-01: qty 1

## 2019-01-01 MED ORDER — LIDOCAINE HCL (PF) 2 % IJ SOLN
INTRAMUSCULAR | Status: AC
  Filled 2019-01-01: qty 10

## 2019-01-01 MED ORDER — HYDROCHLOROTHIAZIDE 25 MG PO TABS
25.0000 mg | ORAL_TABLET | Freq: Every day | ORAL | Status: DC
  Administered 2019-01-03: 10:00:00 25 mg via ORAL
  Filled 2019-01-01 (×2): qty 1

## 2019-01-01 MED ORDER — OXYCODONE HCL 5 MG PO TABS
10.0000 mg | ORAL_TABLET | ORAL | Status: DC | PRN
  Filled 2019-01-01: qty 3

## 2019-01-01 MED ORDER — SODIUM CHLORIDE 0.9 % IJ SOLN
INTRAMUSCULAR | Status: DC | PRN
  Administered 2019-01-01: 40 mL

## 2019-01-01 MED ORDER — PROPOFOL 500 MG/50ML IV EMUL
INTRAVENOUS | Status: DC | PRN
  Administered 2019-01-01: 25 ug/kg/min via INTRAVENOUS

## 2019-01-01 MED ORDER — OXYCODONE HCL 5 MG PO TABS
ORAL_TABLET | ORAL | Status: AC
  Filled 2019-01-01: qty 1

## 2019-01-01 MED ORDER — LACTATED RINGERS IV SOLN
INTRAVENOUS | Status: DC
  Administered 2019-01-01 (×2): via INTRAVENOUS

## 2019-01-01 MED ORDER — CEFAZOLIN SODIUM-DEXTROSE 2-4 GM/100ML-% IV SOLN
INTRAVENOUS | Status: AC
  Filled 2019-01-01: qty 100

## 2019-01-01 MED ORDER — BUPIVACAINE HCL 0.25 % IJ SOLN
INTRAMUSCULAR | Status: DC | PRN
  Administered 2019-01-01: 30 mL

## 2019-01-01 MED ORDER — PHENYLEPHRINE HCL (PRESSORS) 10 MG/ML IV SOLN
INTRAVENOUS | Status: DC | PRN
  Administered 2019-01-01 (×3): 100 ug via INTRAVENOUS

## 2019-01-01 MED ORDER — GLYCOPYRROLATE 0.2 MG/ML IJ SOLN
INTRAMUSCULAR | Status: DC | PRN
  Administered 2019-01-01: 0.2 mg via INTRAVENOUS

## 2019-01-01 MED ORDER — GLYCOPYRROLATE 0.2 MG/ML IJ SOLN
INTRAMUSCULAR | Status: AC
  Filled 2019-01-01: qty 1

## 2019-01-01 MED ORDER — MIDAZOLAM HCL 2 MG/2ML IJ SOLN
INTRAMUSCULAR | Status: AC
  Filled 2019-01-01: qty 2

## 2019-01-01 MED ORDER — EPHEDRINE SULFATE 50 MG/ML IJ SOLN
INTRAMUSCULAR | Status: DC | PRN
  Administered 2019-01-01 (×3): 10 mg via INTRAVENOUS
  Administered 2019-01-01: 15 mg via INTRAVENOUS

## 2019-01-01 MED ORDER — PROPOFOL 10 MG/ML IV BOLUS
INTRAVENOUS | Status: AC
  Filled 2019-01-01: qty 20

## 2019-01-01 MED ORDER — METOCLOPRAMIDE HCL 5 MG/ML IJ SOLN: 5.0000 mg | Freq: Three times a day (TID) | INTRAMUSCULAR | Status: DC | PRN

## 2019-01-01 MED ORDER — NEOMYCIN-POLYMYXIN B GU 40-200000 IR SOLN
Status: AC
  Filled 2019-01-01: qty 20

## 2019-01-01 MED ORDER — ENOXAPARIN SODIUM 40 MG/0.4ML ~~LOC~~ SOLN
40.0000 mg | SUBCUTANEOUS | Status: DC
  Administered 2019-01-02 – 2019-01-04 (×3): 40 mg via SUBCUTANEOUS
  Filled 2019-01-01 (×3): qty 0.4

## 2019-01-01 MED ORDER — BUPIVACAINE HCL (PF) 0.5 % IJ SOLN
INTRAMUSCULAR | Status: AC
  Filled 2019-01-01: qty 10

## 2019-01-01 MED ORDER — BUPIVACAINE LIPOSOME 1.3 % IJ SUSP
INTRAMUSCULAR | Status: AC
  Filled 2019-01-01: qty 20

## 2019-01-01 MED ORDER — SODIUM CHLORIDE 0.9 % IV SOLN
INTRAVENOUS | Status: DC
  Administered 2019-01-01 (×2): via INTRAVENOUS

## 2019-01-01 MED ORDER — MIDAZOLAM HCL 5 MG/5ML IJ SOLN
INTRAMUSCULAR | Status: DC | PRN
  Administered 2019-01-01: 2 mg via INTRAVENOUS

## 2019-01-01 MED ORDER — HYDROMORPHONE HCL 1 MG/ML IJ SOLN: 0.5000 mg | INTRAMUSCULAR | Status: DC | PRN

## 2019-01-01 MED ORDER — LISINOPRIL-HYDROCHLOROTHIAZIDE 20-12.5 MG PO TABS: 2.0000 | ORAL_TABLET | Freq: Every day | ORAL | Status: DC

## 2019-01-01 MED ORDER — METHOCARBAMOL 500 MG PO TABS: 500.0000 mg | ORAL_TABLET | Freq: Four times a day (QID) | ORAL | Status: DC | PRN

## 2019-01-01 MED ORDER — CEFAZOLIN SODIUM-DEXTROSE 2-4 GM/100ML-% IV SOLN
2.0000 g | Freq: Once | INTRAVENOUS | Status: AC
  Administered 2019-01-01: 2 g via INTRAVENOUS

## 2019-01-01 MED ORDER — BUPIVACAINE HCL (PF) 0.5 % IJ SOLN
INTRAMUSCULAR | Status: DC | PRN
  Administered 2019-01-01: 3 mL via INTRATHECAL

## 2019-01-01 MED ORDER — MAGNESIUM HYDROXIDE 400 MG/5ML PO SUSP
30.0000 mL | Freq: Every day | ORAL | Status: DC | PRN
  Administered 2019-01-02: 09:00:00 30 mL via ORAL
  Filled 2019-01-01: qty 30

## 2019-01-01 MED ORDER — METHOCARBAMOL 1000 MG/10ML IJ SOLN
500.0000 mg | Freq: Four times a day (QID) | INTRAVENOUS | Status: DC | PRN
  Filled 2019-01-01: qty 5

## 2019-01-01 MED ORDER — MENTHOL 3 MG MT LOZG
1.0000 | LOZENGE | OROMUCOSAL | Status: DC | PRN
  Filled 2019-01-01: qty 9

## 2019-01-01 MED ORDER — ONDANSETRON HCL 4 MG PO TABS: 4.0000 mg | ORAL_TABLET | Freq: Four times a day (QID) | ORAL | Status: DC | PRN

## 2019-01-01 MED ORDER — SODIUM CHLORIDE 0.9 % IV SOLN
INTRAVENOUS | Status: DC | PRN
  Administered 2019-01-01: 08:00:00 60 mL

## 2019-01-01 MED ORDER — ASPIRIN EC 81 MG PO TBEC
81.0000 mg | DELAYED_RELEASE_TABLET | Freq: Every day | ORAL | Status: DC
  Administered 2019-01-02 – 2019-01-04 (×3): 81 mg via ORAL
  Filled 2019-01-01 (×3): qty 1

## 2019-01-01 MED ORDER — AMLODIPINE BESYLATE 10 MG PO TABS
10.0000 mg | ORAL_TABLET | Freq: Every day | ORAL | Status: DC
  Administered 2019-01-03: 10 mg via ORAL
  Filled 2019-01-01 (×2): qty 1

## 2019-01-01 MED ORDER — OXYCODONE HCL 5 MG PO TABS
5.0000 mg | ORAL_TABLET | ORAL | Status: DC | PRN
  Administered 2019-01-01 – 2019-01-04 (×4): 5 mg via ORAL
  Filled 2019-01-01: qty 2
  Filled 2019-01-01: qty 1
  Filled 2019-01-01: qty 2
  Filled 2019-01-01 (×2): qty 1

## 2019-01-01 MED ORDER — ONDANSETRON HCL 4 MG/2ML IJ SOLN: 4.0000 mg | Freq: Four times a day (QID) | INTRAMUSCULAR | Status: DC | PRN

## 2019-01-01 MED ORDER — SODIUM CHLORIDE FLUSH 0.9 % IV SOLN
INTRAVENOUS | Status: AC
  Filled 2019-01-01: qty 40

## 2019-01-01 MED ORDER — BUPIVACAINE HCL (PF) 0.25 % IJ SOLN
INTRAMUSCULAR | Status: AC
  Filled 2019-01-01: qty 30

## 2019-01-01 MED ORDER — LISINOPRIL 20 MG PO TABS
40.0000 mg | ORAL_TABLET | Freq: Every day | ORAL | Status: DC
  Administered 2019-01-03: 10:00:00 40 mg via ORAL
  Filled 2019-01-01 (×2): qty 2

## 2019-01-01 MED ORDER — ALUM & MAG HYDROXIDE-SIMETH 200-200-20 MG/5ML PO SUSP: 30.0000 mL | ORAL | Status: DC | PRN

## 2019-01-01 MED ORDER — BISACODYL 10 MG RE SUPP: 10.0000 mg | Freq: Every day | RECTAL | Status: DC | PRN

## 2019-01-01 MED ORDER — ONDANSETRON HCL 4 MG/2ML IJ SOLN: 4.0000 mg | Freq: Once | INTRAMUSCULAR | Status: DC | PRN

## 2019-01-01 MED ORDER — MAGNESIUM CITRATE PO SOLN
1.0000 | Freq: Once | ORAL | Status: DC | PRN
  Filled 2019-01-01: qty 296

## 2019-01-01 MED ORDER — PHENYLEPHRINE HCL (PRESSORS) 10 MG/ML IV SOLN
INTRAVENOUS | Status: AC
  Filled 2019-01-01: qty 1

## 2019-01-01 MED ORDER — PANTOPRAZOLE SODIUM 40 MG PO TBEC
40.0000 mg | DELAYED_RELEASE_TABLET | Freq: Every day | ORAL | Status: DC
  Administered 2019-01-02 – 2019-01-04 (×3): 40 mg via ORAL
  Filled 2019-01-01 (×3): qty 1

## 2019-01-01 MED ORDER — NEOMYCIN-POLYMYXIN B GU 40-200000 IR SOLN
Status: DC | PRN
  Administered 2019-01-01: 4 mL

## 2019-01-01 MED ORDER — FAMOTIDINE 20 MG PO TABS
20.0000 mg | ORAL_TABLET | Freq: Once | ORAL | Status: AC
  Administered 2019-01-01: 07:00:00 20 mg via ORAL

## 2019-01-01 MED ORDER — DIPHENHYDRAMINE HCL 12.5 MG/5ML PO ELIX: 12.5000 mg | ORAL_SOLUTION | ORAL | Status: DC | PRN

## 2019-01-01 MED ORDER — SODIUM CHLORIDE 0.9 % IV SOLN
INTRAVENOUS | Status: DC | PRN
  Administered 2019-01-01: 30 ug/min via INTRAVENOUS

## 2019-01-01 MED ORDER — FENTANYL CITRATE (PF) 100 MCG/2ML IJ SOLN: 25.0000 ug | INTRAMUSCULAR | Status: DC | PRN

## 2019-01-01 MED ORDER — LIDOCAINE HCL (PF) 2 % IJ SOLN
INTRAMUSCULAR | Status: DC | PRN
  Administered 2019-01-01: 50 mg

## 2019-01-01 MED ORDER — CEFAZOLIN SODIUM-DEXTROSE 2-4 GM/100ML-% IV SOLN
2.0000 g | Freq: Four times a day (QID) | INTRAVENOUS | Status: AC
  Administered 2019-01-01 (×2): 2 g via INTRAVENOUS
  Filled 2019-01-01 (×3): qty 100

## 2019-01-01 MED ORDER — PHENOL 1.4 % MT LIQD
1.0000 | OROMUCOSAL | Status: DC | PRN
  Filled 2019-01-01: qty 177

## 2019-01-01 MED ORDER — ZOLPIDEM TARTRATE 5 MG PO TABS: 5.0000 mg | ORAL_TABLET | Freq: Every evening | ORAL | Status: DC | PRN

## 2019-01-01 MED ORDER — PANTOPRAZOLE SODIUM 20 MG PO TBEC
20.0000 mg | DELAYED_RELEASE_TABLET | Freq: Every day | ORAL | Status: DC
  Filled 2019-01-01: qty 1

## 2019-01-01 MED ORDER — EPHEDRINE SULFATE 50 MG/ML IJ SOLN
INTRAMUSCULAR | Status: AC
  Filled 2019-01-01: qty 1

## 2019-01-01 SURGICAL SUPPLY — 60 items
BLADE SAGITTAL AGGR TOOTH XLG (BLADE) ×3 IMPLANT
BNDG COHESIVE 6X5 TAN STRL LF (GAUZE/BANDAGES/DRESSINGS) ×9 IMPLANT
CANISTER SUCT 1200ML W/VALVE (MISCELLANEOUS) ×3 IMPLANT
CANISTER WOUND CARE 500ML ATS (WOUND CARE) ×3 IMPLANT
CHLORAPREP W/TINT 26 (MISCELLANEOUS) ×3 IMPLANT
COVER BACK TABLE REUSABLE LG (DRAPES) ×3 IMPLANT
COVER WAND RF STERILE (DRAPES) ×3 IMPLANT
DRAPE 3/4 80X56 (DRAPES) ×9 IMPLANT
DRAPE C-ARM XRAY 36X54 (DRAPES) ×3 IMPLANT
DRAPE INCISE IOBAN 66X60 STRL (DRAPES) IMPLANT
DRAPE POUCH INSTRU U-SHP 10X18 (DRAPES) ×3 IMPLANT
DRESSING SURGICEL FIBRLLR 1X2 (HEMOSTASIS) ×2 IMPLANT
DRSG OPSITE POSTOP 4X8 (GAUZE/BANDAGES/DRESSINGS) ×6 IMPLANT
DRSG SURGICEL FIBRILLAR 1X2 (HEMOSTASIS) ×6
ELECT BLADE 6.5 EXT (BLADE) ×3 IMPLANT
ELECT REM PT RETURN 9FT ADLT (ELECTROSURGICAL) ×3
ELECTRODE REM PT RTRN 9FT ADLT (ELECTROSURGICAL) ×1 IMPLANT
GLOVE BIOGEL PI IND STRL 9 (GLOVE) ×1 IMPLANT
GLOVE BIOGEL PI INDICATOR 9 (GLOVE) ×2
GLOVE SURG SYN 9.0  PF PI (GLOVE) ×4
GLOVE SURG SYN 9.0 PF PI (GLOVE) ×2 IMPLANT
GOWN SRG 2XL LVL 4 RGLN SLV (GOWNS) ×1 IMPLANT
GOWN STRL NON-REIN 2XL LVL4 (GOWNS) ×2
GOWN STRL REUS W/ TWL LRG LVL3 (GOWN DISPOSABLE) ×1 IMPLANT
GOWN STRL REUS W/TWL LRG LVL3 (GOWN DISPOSABLE) ×2
HEMOVAC 400CC 10FR (MISCELLANEOUS) IMPLANT
HIP FEM HD M 28 (Head) ×2 IMPLANT
HOLDER FOLEY CATH W/STRAP (MISCELLANEOUS) ×3 IMPLANT
HOOD PEEL AWAY FLYTE STAYCOOL (MISCELLANEOUS) ×3 IMPLANT
KIT PREVENA INCISION MGT 13 (CANNISTER) ×3 IMPLANT
LINER DML 28MM HIGHCROSS (Liner) ×2 IMPLANT
MAT ABSORB  FLUID 56X50 GRAY (MISCELLANEOUS) ×2
MAT ABSORB FLUID 56X50 GRAY (MISCELLANEOUS) ×1 IMPLANT
NDL SAFETY ECLIPSE 18X1.5 (NEEDLE) ×1 IMPLANT
NDL SPNL 20GX3.5 QUINCKE YW (NEEDLE) ×2 IMPLANT
NEEDLE HYPO 18GX1.5 SHARP (NEEDLE) ×2
NEEDLE SPNL 20GX3.5 QUINCKE YW (NEEDLE) ×6 IMPLANT
NS IRRIG 1000ML POUR BTL (IV SOLUTION) ×3 IMPLANT
PACK HIP COMPR (MISCELLANEOUS) ×3 IMPLANT
SCALPEL PROTECTED #10 DISP (BLADE) ×6 IMPLANT
SHELL ACETABULAR DM  60MM (Shell) ×2 IMPLANT
SOL PREP PVP 2OZ (MISCELLANEOUS) ×3
SOLUTION PREP PVP 2OZ (MISCELLANEOUS) ×1 IMPLANT
SPONGE DRAIN TRACH 4X4 STRL 2S (GAUZE/BANDAGES/DRESSINGS) ×3 IMPLANT
STAPLER SKIN PROX 35W (STAPLE) ×3 IMPLANT
STEM FEM SZ6 STD COLLARED (Stem) ×2 IMPLANT
STRAP SAFETY 5IN WIDE (MISCELLANEOUS) ×3 IMPLANT
SUT DVC 2 QUILL PDO  T11 36X36 (SUTURE) ×2
SUT DVC 2 QUILL PDO T11 36X36 (SUTURE) ×1 IMPLANT
SUT SILK 0 (SUTURE) ×2
SUT SILK 0 30XBRD TIE 6 (SUTURE) ×1 IMPLANT
SUT V-LOC 90 ABS DVC 3-0 CL (SUTURE) ×3 IMPLANT
SUT VIC AB 1 CT1 36 (SUTURE) ×3 IMPLANT
SYR 20ML LL LF (SYRINGE) ×3 IMPLANT
SYR 30ML LL (SYRINGE) ×3 IMPLANT
SYR 50ML LL SCALE MARK (SYRINGE) ×6 IMPLANT
SYR BULB IRRIG 60ML STRL (SYRINGE) ×3 IMPLANT
TAPE MICROFOAM 4IN (TAPE) ×3 IMPLANT
TOWEL OR 17X26 4PK STRL BLUE (TOWEL DISPOSABLE) ×3 IMPLANT
TRAY FOLEY MTR SLVR 16FR STAT (SET/KITS/TRAYS/PACK) ×3 IMPLANT

## 2019-01-01 NOTE — TOC Progression Note (Signed)
Transition of Care [facility]) - Progression Note    Patient Details  Name: Steven Welch MRN: [id number] Date of Birth: [birth year]  [facility]) CM/SW Contact  Su Hilt, RN Phone Number: 01/01/2019, 11:29 AM  Clinical Narrative:     Requested the price of lovenox       Expected Discharge Plan and Services                                                 Social Determinants of Health (SDOH) Interventions    Readmission Risk Interventions No flowsheet data found.

## 2019-01-01 NOTE — Anesthesia Preprocedure Evaluation (Signed)
Anesthesia Evaluation  Patient identified by MRN, date of birth, ID band Patient awake    Reviewed: Allergy & Precautions, H&P , NPO status , Patient's Chart, lab work & pertinent test results, reviewed documented beta blocker date and time   History of Anesthesia Complications Negative for: history of anesthetic complications  Airway Mallampati: II  TM Distance: >3 FB Neck ROM: full    Dental  (+) Dental Advidsory Given, Caps, Teeth Intact, Missing   Pulmonary neg pulmonary ROS,    Pulmonary exam normal        Cardiovascular Exercise Tolerance: Good hypertension, (-) angina(-) Past MI and (-) Cardiac Stents Normal cardiovascular exam(-) dysrhythmias + Valvular Problems/Murmurs (as a child, but no longer)      Neuro/Psych negative neurological ROS  negative psych ROS   GI/Hepatic Neg liver ROS, GERD  ,  Endo/Other  negative endocrine ROS  Renal/GU negative Renal ROS  negative genitourinary   Musculoskeletal   Abdominal   Peds  Hematology negative hematology ROS (+)   Anesthesia Other Findings Past Medical History: 1981: Cyst of breast     Comment:  right No date: GERD (gastroesophageal reflux disease) 1988: Hepatitis No date: History of shingles No date: Hyperlipidemia No date: Hypertension No date: Osteoarthritis of right hip No date: Skin cancer of face   Reproductive/Obstetrics negative OB ROS                             Anesthesia Physical Anesthesia Plan  ASA: II  Anesthesia Plan: Spinal   Post-op Pain Management:    Induction:   PONV Risk Score and Plan: 1 and Propofol infusion and TIVA  Airway Management Planned: Natural Airway and Nasal Cannula  Additional Equipment:   Intra-op Plan:   Post-operative Plan:   Informed Consent: I have reviewed the patients History and Physical, chart, labs and discussed the procedure including the risks, benefits and  alternatives for the proposed anesthesia with the patient or authorized representative who has indicated his/her understanding and acceptance.     Dental Advisory Given  Plan Discussed with: Anesthesiologist, CRNA and Surgeon  Anesthesia Plan Comments:         Anesthesia Quick Evaluation

## 2019-01-01 NOTE — TOC Initial Note (Signed)
Transition of Care [facility]) - Initial/Assessment Note    Patient Details  Name: Steven Welch MRN: [id number] Date of Birth: 19-Jan-1941  Transition of Care [facility]) CM/SW Contact:    Su Hilt, RN Phone Number: 01/01/2019, 12:05 PM  Clinical Narrative:                 Met with the patient and his Wife in the room to discuss DC plan and needs He lives at home in a single family home with his wife He does not hjave any DME at home and will need a RW and BSC I notified Brad with [employer] that the patient is in need [facility] PT I notified Helene Kelp and she has accepted His wife will provide transportation He is up to date with his PCP He is aware of the need to use Lovenox and once I get the price quote I will notify him of the price, he can afford his medications with no problems  No additional needs at this time  Expected Discharge Plan: [place] Barriers to Discharge: Continued Medical Work up   Patient Goals and CMS Choice Patient states their goals for this hospitalization and ongoing recovery are:: go home CMS Medicare.gov Compare Post Acute Care list provided to:: Patient Choice offered to / list presented to : Patient  Expected Discharge Plan and Services Expected Discharge Plan: [place]   Discharge Planning Services: CM Consult Post Acute Care Choice: [place] arrangements for the past 2 months: Single Family Home                 DME Arranged: 3-N-1, [place] rolling DME Agency: [employer] Date DME Agency Contacted: 01/01/19   Representative spoke with at DME Agency: [place]: PT [place]: [facility] [employer] Date Pump Back: 01/01/19 Time [place]: 1204 Representative spoke with at [place] Arrangements/Services Living arrangements for the past 2 months: [place] Lives with:: Spouse Patient language  and need for interpreter reviewed:: Yes Do you feel safe going back to the place where you live?: Yes      Need for Family Participation in Patient Care: No (Comment) Care giver support system in place?: Yes (comment)   Criminal Activity/Legal Involvement Pertinent to Current Situation/Hospitalization: No - Comment as needed  Activities of Daily Living Home Assistive Devices/Equipment: Eyeglasses ADL Screening (condition at time of admission) Patient's cognitive ability adequate to safely complete daily activities?: Yes Is the patient deaf or have difficulty hearing?: No Does the patient have difficulty seeing, even when wearing glasses/contacts?: No Does the patient have difficulty concentrating, remembering, or making decisions?: No Patient able to express need for assistance with ADLs?: Yes Does the patient have difficulty dressing or bathing?: No Independently performs ADLs?: Yes (appropriate for developmental age) Does the patient have difficulty walking or climbing stairs?: Yes Weakness of Legs: Right Weakness of Arms/Hands: None  Permission Sought/Granted   Permission granted to share information with : Yes, Verbal Permission Granted              Emotional Assessment Appearance:: Appears stated age Attitude/Demeanor/Rapport: Engaged Affect (typically observed): Accepting, Happy, Appropriate, Calm Orientation: : Oriented to Self, Oriented to Place, Oriented to  Time, Oriented to Situation Alcohol / Substance Use: Not Applicable Psych Involvement: No (comment)  Admission diagnosis:  PRIMARY OSTEOARTHRITIS OF RIGHT HIP Patient Active Problem List  Diagnosis Date Noted  . Status post total hip replacement, 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   PCP:  Juluis Pitch, MD Pharmacy:   [facility] [phone] [address], [place] [address] Phone: [phone] Fax: [phone]     Social  Determinants of Health (SDOH) Interventions    Readmission Risk Interventions No flowsheet data found.

## 2019-01-01 NOTE — Anesthesia Post-op Follow-up Note (Signed)
Anesthesia QCDR form completed.        

## 2019-01-01 NOTE — H&P (Signed)
Reviewed paper H+P, will be scanned into chart. No changes noted.  

## 2019-01-01 NOTE — Anesthesia Procedure Notes (Signed)
Spinal  Patient location during procedure: OR Staffing Anesthesiologist: Karenz, Andrew, MD Resident/CRNA: Michaeljoseph Revolorio, CRNA Performed: resident/CRNA  Preanesthetic Checklist Completed: patient identified, site marked, surgical consent, pre-op evaluation, timeout performed, IV checked, risks and benefits discussed and monitors and equipment checked Spinal Block Patient position: sitting Prep: ChloraPrep and site prepped and draped Patient monitoring: heart rate, continuous pulse ox, blood pressure and cardiac monitor Approach: midline Location: L4-5 Injection technique: single-shot Needle Needle type: Introducer and Pencan  Needle gauge: 24 G Needle length: 9 cm Additional Notes Negative paresthesia. Negative blood return. Positive free-flowing CSF. Expiration date of kit checked and confirmed. Patient tolerated procedure well, without complications.       

## 2019-01-01 NOTE — Evaluation (Signed)
Physical Therapy Evaluation Patient Details Name: Steven Welch MRN: [id number] DOB: 05-13-1940 Today's Date: 01/01/2019   History of Present Illness  Pt is a 78 y/o M admitted s/p R THA to address OA. PMH includes HTN, GERD, HLD.  Clinical Impression  Pt is a pleasant 78 year old M who was admitted s/p R THA. Upon entry, pt states that his pain is 4/10 on NPS; pain is unchanged following session. Pt performs ther-ex, bed mobility, transfers, and ambulation with supervision/CGA and use of RW. Pt demonstrates deficits with strength, ROM, mobility, and pain management. Pt will benefit from skilled PT to address above impairments; current follow-up care recommendation is HHPT.      Follow Up Recommendations Home health PT    Equipment Recommendations  Rolling walker with 5" wheels    Recommendations for Other Services       Precautions / Restrictions Precautions Precautions: Anterior Hip Precaution Booklet Issued: No Restrictions Weight Bearing Restrictions: Yes RLE Weight Bearing: Weight bearing as tolerated      Mobility  Bed Mobility Overal bed mobility: Needs Assistance Bed Mobility: Supine to Sit     Supine to sit: Min guard     General bed mobility comments: Pt able to perform bed mobility tasks with supervision and use of bed rails/handheld assist  Transfers Overall transfer level: Needs assistance Equipment used: Rolling walker (2 wheeled) Transfers: Sit to/from Stand Sit to Stand: Min guard         General transfer comment: Pt able to perform transfer with CGA and use of RW  Ambulation/Gait Ambulation/Gait assistance: Min guard Gait Distance (Feet): 5 Feet Assistive device: Rolling walker (2 wheeled) Gait Pattern/deviations: Step-to pattern;Antalgic     General Gait Details: Pt amb slowly with antalgic pattern to recliner; CGA provided by PT  Stairs            Wheelchair Mobility    Modified Rankin (Stroke Patients Only)       Balance  Overall balance assessment: Needs assistance Sitting-balance support: Feet supported Sitting balance-Leahy Scale: Good Sitting balance - Comments: Able to attain upright posture   Standing balance support: Bilateral upper extremity supported Standing balance-Leahy Scale: Good Standing balance comment: Able to attain upright posture with RW                             Pertinent Vitals/Pain Pain Assessment: 0-10 Pain Score: 4  Pain Location: R Hip Pain Descriptors / Indicators: Dull;Discomfort Pain Intervention(s): Limited activity within patient's tolerance;Monitored during session;Repositioned    Home Living Family/patient expects to be discharged to:: Private residence Living Arrangements: Spouse/significant other Available Help at Discharge: Family;Available 24 hours/day Type of Home: House Home Access: Stairs to enter Entrance Stairs-Rails: None Entrance Stairs-Number of Steps: 2 Home Layout: One level Home Equipment: Cane - single point Additional Comments: Pt not using AD prior to THA. Has SPC available at home.    Prior Function Level of Independence: Independent         Comments: Pt reports he is able to perform all ADL's/IADL's independently     Hand Dominance        Extremity/Trunk Assessment   Upper Extremity Assessment Upper Extremity Assessment: Overall WFL for tasks assessed    Lower Extremity Assessment Lower Extremity Assessment: Overall WFL for tasks assessed       Communication   Communication: No difficulties  Cognition Arousal/Alertness: Awake/alert Behavior During Therapy: WFL for tasks assessed/performed Overall Cognitive Status: Within  Functional Limits for tasks assessed                                        General Comments      Exercises Other Exercises Other Exercises: Supine, 10 reps, bilateral: AP, hip add/abd, GS, QS; PT supervision   Assessment/Plan    PT Assessment Patient needs continued  PT services  PT Problem List Decreased strength;Decreased range of motion;Decreased activity tolerance;Decreased balance;Decreased mobility;Pain       PT Treatment Interventions DME instruction;Gait training;Stair training;Functional mobility training;Therapeutic activities;Therapeutic exercise;Balance training;Patient/family education    PT Goals (Current goals can be found in the Care Plan section)  Acute Rehab PT Goals Patient Stated Goal: to return home PT Goal Formulation: With patient Time For Goal Achievement: 01/15/19 Potential to Achieve Goals: Good    Frequency BID   Barriers to discharge        Co-evaluation               AM-PAC PT "6 Clicks" Mobility  Outcome Measure Help needed turning from your back to your side while in a flat bed without using bedrails?: None Help needed moving from lying on your back to sitting on the side of a flat bed without using bedrails?: A Little Help needed moving to and from a bed to a chair (including a wheelchair)?: A Little Help needed standing up from a chair using your arms (e.g., wheelchair or bedside chair)?: A Little Help needed to walk in hospital room?: A Little Help needed climbing 3-5 steps with a railing? : A Little 6 Click Score: 19    End of Session Equipment Utilized During Treatment: Gait belt Activity Tolerance: Patient tolerated treatment well Patient left: in chair;with call bell/phone within reach;with chair alarm set;with SCD's reapplied   PT Visit Diagnosis: Muscle weakness (generalized) (M62.81);Pain;Difficulty in walking, not elsewhere classified (R26.2) Pain - Right/Left: Right Pain - part of body: Hip    Time: 1440-1510 PT Time Calculation (min) (ACUTE ONLY): 30 min   Charges:   PT Evaluation $PT Eval Low Complexity: 1 Low PT Treatments $Therapeutic Exercise: 8-22 mins        Idrissa Beville, SPT   Merced Brougham 01/01/2019, 4:28 PM

## 2019-01-01 NOTE — TOC Benefit Eligibility Note (Signed)
Transition of Care [facility]) Benefit Eligibility Note    Patient Details  Name: Jean-Paul Kosh MRN: [id number] Date of Birth: 04-30-1940   Medication/Dose: Enoxparin 40mg  once daily for 14 days  Covered?: Yes  Tier: Other(Tier 4)  Prescription Coverage Preferred Pharmacy: [facility] with Person/Company/Phone Number:: Minna Merritts with [employer] [facility] at [phone]  Co-Pay: $150 estimated copay (includes deductible)  Prior Approval: No  Deductible: Unmet($50 deductible, unmet as of time of call.)   Dannette Barbara Phone Number: [phone] or [phone] 01/01/2019, 2:12 PM

## 2019-01-01 NOTE — Transfer of Care (Signed)
Immediate Anesthesia Transfer of Care Note  Patient: Steven Welch  Procedure(s) Performed: TOTAL HIP ARTHROPLASTY ANTERIOR APPROACH (Right Hip)  Patient Location: PACU  Anesthesia Type:Spinal  Level of Consciousness: awake, alert  and oriented  Airway & Oxygen Therapy: Patient Spontanous Breathing and Patient connected to face mask oxygen  Post-op Assessment: Report given to RN and Post -op Vital signs reviewed and stable  Post vital signs: Reviewed  Last Vitals:  Vitals Value Taken Time  BP 119/65 01/01/19 0843  Temp    Pulse 81 01/01/19 0843  Resp 21 01/01/19 0843  SpO2 100 % 01/01/19 0843  Vitals shown include unvalidated device data.  Last Pain:  Vitals:   01/01/19 0631  TempSrc: Tympanic  PainSc: 4          Complications: No apparent anesthesia complications

## 2019-01-01 NOTE — Op Note (Signed)
01/01/2019  8:45 AM  PATIENT:  Steven Welch  78 y.o. male  PRE-OPERATIVE DIAGNOSIS:  PRIMARY OSTEOARTHRITIS OF RIGHT HIP  POST-OPERATIVE DIAGNOSIS:  PRIMARY OSTEOARTHRITIS OF RIGHT HIP  PROCEDURE:  Procedure(s): TOTAL HIP ARTHROPLASTY ANTERIOR APPROACH (Right)  SURGEON: Laurene Footman, MD  ASSISTANTS: None  ANESTHESIA:   spinal  EBL:  Total I/O In: 1000 [I.V.:1000] Out: 350 [Urine:150; Blood:200]  BLOOD ADMINISTERED:none  DRAINS: none   LOCAL MEDICATIONS USED:  MARCAINE    and OTHER Exparel  SPECIMEN:  Source of Specimen:  Right femoral head  DISPOSITION OF SPECIMEN:  PATHOLOGY  COUNTS:  YES  TOURNIQUET:  * No tourniquets in log *  IMPLANTS: Medacta AMIS 6 standard stem with M 28 mm metal head, 60 mm mPACT DM cup and liner  DICTATION: .Dragon Dictation   The patient was brought to the operating room and after spinal anesthesia was obtained patient was placed on the operative table with the ipsilateral foot into the Medacta attachment, contralateral leg on a well-padded table. C-arm was brought in and preop template x-ray taken. After prepping and draping in usual sterile fashion appropriate patient identification and timeout procedures were completed. Anterior approach to the hip was obtained and centered over the greater trochanter and TFL muscle. The subcutaneous tissue was incised hemostasis being achieved by electrocautery. TFL fascia was incised and the muscle retracted laterally deep retractor placed. The lateral femoral circumflex vessels were identified and ligated. The anterior capsule was exposed and a capsulotomy performed. The neck was identified and a femoral neck cut carried out with a saw. The head was removed without difficulty and showed sclerotic femoral head and acetabulum. Reaming was carried out to 60 mm and a 60 mm cup trial gave appropriate tightness to the acetabular component a 60 DM cup was impacted into position. The leg was then externally rotated and  ischiofemoral and pubofemoral releases carried out. The femur was sequentially broached to a size 6, size 6 standard with S head trials were placed and the final components chosen. The 6 standard stem was inserted along with a metal M 28 mm head and 60 mm liner. The hip was reduced and was stable the wound was thoroughly irrigated with fibrillar placed along the posterior capsule and medial neck. The deep fascia ws closed using a heavy Quill after infiltration of 30 cc of quarter percent Sensorcaine and Exparel applied throughout the case and the deep tissues for postop analgesia.  Marland Kitchen3-0 V-loc to close the skin with skin staples.  Incisional wound VAC applied patient was sent to recovery in stable condition.   PLAN OF CARE: Admit to inpatient

## 2019-01-01 NOTE — Evaluation (Signed)
Occupational Therapy Evaluation Patient Details Name: Steven Welch MRN: [id number] DOB: [birth year] Today's Date: 01/01/2019    History of Present Illness Pt is a 78 y/o M admitted s/p R THA to address OA. PMH includes HTN, GERD, HLD.   Clinical Impression   Pt seen for OT evaluation this date, POD#1 from above surgery. Pt was independent in all ADLs prior to surgery and is eager to return to PLOF with less pain and improved safety and independence. Pt currently requires minimal assist for LB dressing and bathing while in seated position due to pain and limited AROM of R hip. Pt/spouse instructed in self care skills, falls prevention strategies, home/routines modifications, DME/AE for LB bathing and dressing tasks, and compression stocking mgt strategies. Pt would benefit from additional instruction in self care skills and techniques to help maintain precautions with or without assistive devices to support recall and carryover prior to discharge. Do not anticipate need for skilled OT services upon discharge.     Follow Up Recommendations  No OT follow up    Equipment Recommendations  3 in 1 bedside commode;Other (comment)(reacher, LH sponge)    Recommendations for Other Services       Precautions / Restrictions Precautions Precautions: Anterior Hip;Fall Precaution Booklet Issued: No Restrictions Weight Bearing Restrictions: Yes RLE Weight Bearing: Weight bearing as tolerated      Mobility Bed Mobility     General bed mobility comments: deferred, received up in recliner  Transfers Overall transfer level: Needs assistance Equipment used: Rolling walker (2 wheeled) Transfers: Sit to/from Stand Sit to Stand: Min guard         General transfer comment: Pt able to perform transfer with CGA and use of RW    Balance Overall balance assessment: Needs assistance Sitting-balance support: Feet supported Sitting balance-Leahy Scale: Good Sitting balance - Comments: Able to attain  upright posture   Standing balance support: Bilateral upper extremity supported Standing balance-Leahy Scale: Good Standing balance comment: Able to attain upright posture with RW                           ADL either performed or assessed with clinical judgement   ADL Overall ADL's : Needs assistance/impaired                                       General ADL Comments: Min A for LB ADL and Mod A for compression stocking mgt, limited by pain; spouse able to provide needed level of assist     Vision Baseline Vision/History: Wears glasses Wears Glasses: At all times Patient Visual Report: No change from baseline Vision Assessment?: No apparent visual deficits     Perception     Praxis      Pertinent Vitals/Pain Pain Assessment: 0-10 Pain Score: 4  Pain Location: R Hip Pain Descriptors / Indicators: Dull;Discomfort Pain Intervention(s): Limited activity within patient's tolerance;Monitored during session;Patient requesting pain meds-RN notified(RN notified prior to session, per pt)     Hand Dominance     Extremity/Trunk Assessment Upper Extremity Assessment Upper Extremity Assessment: Overall WFL for tasks assessed   Lower Extremity Assessment Lower Extremity Assessment: Overall WFL for tasks assessed       Communication Communication Communication: No difficulties   Cognition Arousal/Alertness: Awake/alert Behavior During Therapy: WFL for tasks assessed/performed Overall Cognitive Status: Within Functional Limits for tasks assessed  General Comments       Exercises Exercises: Other exercises Other Exercises: Pt/spouse instructed in falls prevention strategies, compression stocking mgt, and AE/DME for ADL tasks; handout provided   Shoulder Instructions      Home Living Family/patient expects to be discharged to:: Private residence Living Arrangements: Spouse/significant  other Available Help at Discharge: Family;Available 24 hours/day Type of Home: House Home Access: Stairs to enter [employer] of Steps: 2 Entrance Stairs-Rails: None Home Layout: One level     Bathroom Shower/Tub: Occupational psychologist: Standard     Home Equipment: Cane - single point;Toilet riser   Additional Comments: Pt not using AD prior to THA. Has SPC available at home.      Prior Functioning/Environment Level of Independence: Independent        Comments: Pt reports he is able to perform all ADL's/IADL's independently        OT Problem List: Decreased strength;Pain;Decreased knowledge of use of DME or AE      OT Treatment/Interventions: Self-care/ADL training;Therapeutic exercise;Therapeutic activities;DME and/or AE instruction;Patient/family education    OT Goals(Current goals can be found in the care plan section) Acute Rehab OT Goals Patient Stated Goal: to return home OT Goal Formulation: With patient/family Time For Goal Achievement: 01/15/19 Potential to Achieve Goals: Good ADL Goals Pt Will Perform Lower Body Dressing: sit to/from stand;with adaptive equipment;with modified independence Pt Will Transfer to Toilet: with supervision;ambulating(BSC over toilet, LRAD for amb) Additional ADL Goal #1: Pt will independently instruct spouse in compression stocking mgt  OT Frequency: Min 1X/week   Barriers to D/C:            Co-evaluation              AM-PAC OT "6 Clicks" Daily Activity     Outcome Measure Help from another person eating meals?: None Help from another person taking care of personal grooming?: None Help from another person toileting, which includes using toliet, bedpan, or urinal?: A Little Help from another person bathing (including washing, rinsing, drying)?: A Little Help from another person to put on and taking off regular upper body clothing?: None Help from another person to put on and taking off regular  lower body clothing?: A Little 6 Click Score: 21   End of Session    Activity Tolerance: Patient tolerated treatment well Patient left: in chair;with call bell/phone within reach;with chair alarm set;with family/visitor present;with SCD's reapplied  OT Visit Diagnosis: Other abnormalities of gait and mobility (R26.89);Pain Pain - Right/Left: Right Pain - part of body: Hip                Time: [id number] OT Time Calculation (min): 13 min Charges:  OT General Charges $OT Visit: 1 Visit OT Evaluation $OT Eval Low Complexity: 1 Low  Jeni Salles, MPH, MS, OTR/L ascom [phone] 01/01/19, 5:11 PM

## 2019-01-02 LAB — SURGICAL PATHOLOGY

## 2019-01-02 LAB — CBC
HCT: 35.4 % — ABNORMAL LOW (ref 39.0–52.0)
Hemoglobin: 12.3 g/dL — ABNORMAL LOW (ref 13.0–17.0)
MCH: 32.2 pg (ref 26.0–34.0)
MCHC: 34.7 g/dL (ref 30.0–36.0)
MCV: 92.7 fL (ref 80.0–100.0)
Platelets: 209 10*3/uL (ref 150–400)
RBC: 3.82 MIL/uL — ABNORMAL LOW (ref 4.22–5.81)
RDW: 12.4 % (ref 11.5–15.5)
WBC: 11.1 10*3/uL — ABNORMAL HIGH (ref 4.0–10.5)
nRBC: 0 % (ref 0.0–0.2)

## 2019-01-02 LAB — BASIC METABOLIC PANEL
Anion gap: 9 (ref 5–15)
BUN: 18 mg/dL (ref 8–23)
CO2: 25 mmol/L (ref 22–32)
Calcium: 8.6 mg/dL — ABNORMAL LOW (ref 8.9–10.3)
Chloride: 99 mmol/L (ref 98–111)
Creatinine, Ser: 0.78 mg/dL (ref 0.61–1.24)
GFR calc Af Amer: >60 mL/min (ref >60)
GFR calc non Af Amer: >60 mL/min (ref >60)
Glucose, Bld: 134 mg/dL — ABNORMAL HIGH (ref 70–99)
Potassium: 3.9 mmol/L (ref 3.5–5.1)
Sodium: 133 mmol/L — ABNORMAL LOW (ref 135–145)

## 2019-01-02 MED ORDER — TAMSULOSIN HCL 0.4 MG PO CAPS
0.4000 mg | ORAL_CAPSULE | Freq: Every morning | ORAL | Status: DC
  Administered 2019-01-02 – 2019-01-04 (×3): 0.4 mg via ORAL
  Filled 2019-01-02 (×3): qty 1

## 2019-01-02 NOTE — Anesthesia Postprocedure Evaluation (Signed)
Anesthesia Post Note  Patient: Steven Welch  Procedure(s) Performed: TOTAL HIP ARTHROPLASTY ANTERIOR APPROACH (Right Hip)  Patient location during evaluation: Nursing Unit Anesthesia Type: Spinal Level of consciousness: oriented and awake and alert Pain management: pain level controlled Vital Signs Assessment: post-procedure vital signs reviewed and stable Respiratory status: spontaneous breathing and respiratory function stable Cardiovascular status: blood pressure returned to baseline and stable Postop Assessment: no headache, no backache, no apparent nausea or vomiting and patient able to bend at knees Anesthetic complications: no     Last Vitals:  Vitals:   01/02/19 0424 01/02/19 0749  BP: 136/70 130/69  Pulse: 82 80  Resp: 20 18  Temp: 37.1 C 37 C  SpO2: 94% 93%    Last Pain:  Vitals:   01/02/19 0753  TempSrc:   PainSc: 0-No pain                 Caryl Asp

## 2019-01-02 NOTE — Progress Notes (Signed)
Physical Therapy Treatment Patient Details Name: Steven Welch MRN: [id number] DOB: [birth year] Today's Date: 01/02/2019    History of Present Illness Pt is a 78 y/o M admitted s/p R THA to address OA. PMH includes HTN, GERD, HLD.    PT Comments    Pt is progressing toward goals. Upon entry, pt is seated in recliner and continues to report minimal pain. This date, pt performs bed mobility, transfers, and ambulation with CGA/supervision. Pt able to amb 200 ft today and is progressing toward step-through pattern; still relies heavily on UE support from RW. Pt will continue to benefit from skilled PT to address deficits in strength/ROM/mobility/pain management.    Follow Up Recommendations  Home health PT     Equipment Recommendations  Rolling walker with 5" wheels    Recommendations for Other Services       Precautions / Restrictions Precautions Precautions: Anterior Hip;Fall Precaution Booklet Issued: No Restrictions Weight Bearing Restrictions: Yes RLE Weight Bearing: Weight bearing as tolerated    Mobility  Bed Mobility               General bed mobility comments: pt starts and ends session in recliner  Transfers Overall transfer level: Needs assistance Equipment used: Rolling walker (2 wheeled) Transfers: Sit to/from Stand Sit to Stand: Min guard         General transfer comment: Pt able to perform transfer with CGA and use of RW. Pt able to perform without cues for hand placement this afternoon.  Ambulation/Gait Ambulation/Gait assistance: Min guard Gait Distance (Feet): 200 Feet Assistive device: Rolling walker (2 wheeled) Gait Pattern/deviations: Step-to pattern;Antalgic     General Gait Details: Pt progressing to step-through pattern; still displays antalgic pattern when amb. PT provided demonstration on amb with walker-- staying close enough to the walker and starting with R LE.   Stairs             Wheelchair Mobility    Modified Rankin  (Stroke Patients Only)       Balance Overall balance assessment: Needs assistance Sitting-balance support: Feet supported Sitting balance-Leahy Scale: Good Sitting balance - Comments: Pt attained upright posture in sitting; no posterior lean in recliner   Standing balance support: Bilateral upper extremity supported Standing balance-Leahy Scale: Good Standing balance comment: Able to attain upright posture with RW; relies heavily on RW during amb                            Cognition Arousal/Alertness: Awake/alert Behavior During Therapy: WFL for tasks assessed/performed Overall Cognitive Status: Within Functional Limits for tasks assessed                                        Exercises Other Exercises Other Exercises: Supine, 12 reps, bilateral: AP, hip add/abd, GS, QS; PT supervision Other Exercises: Seated, 12 reps, bilateral: LAQ; PT supervision    General Comments        Pertinent Vitals/Pain Pain Assessment: Faces Faces Pain Scale: Hurts a little bit Pain Location: R Hip Pain Descriptors / Indicators: Dull;Discomfort Pain Intervention(s): Limited activity within patient's tolerance;Monitored during session;Repositioned    Home Living                      Prior Function            PT Goals (current goals can  now be found in the care plan section) Acute Rehab PT Goals Patient Stated Goal: to return home PT Goal Formulation: With patient Time For Goal Achievement: 01/15/19 Potential to Achieve Goals: Good Progress towards PT goals: Progressing toward goals    Frequency    BID      PT Plan Current plan remains appropriate    Co-evaluation              AM-PAC PT "6 Clicks" Mobility   Outcome Measure  Help needed turning from your back to your side while in a flat bed without using bedrails?: None Help needed moving from lying on your back to sitting on the side of a flat bed without using bedrails?: A  Little Help needed moving to and from a bed to a chair (including a wheelchair)?: A Little Help needed standing up from a chair using your arms (e.g., wheelchair or bedside chair)?: A Little Help needed to walk in hospital room?: A Little Help needed climbing 3-5 steps with a railing? : A Little 6 Click Score: 19    End of Session Equipment Utilized During Treatment: Gait belt Activity Tolerance: Patient tolerated treatment well Patient left: in chair;with call bell/phone within reach;with chair alarm set;with SCD's reapplied   PT Visit Diagnosis: Muscle weakness (generalized) (M62.81);Pain;Difficulty in walking, not elsewhere classified (R26.2) Pain - Right/Left: Right Pain - part of body: Hip     Time: 1420-1443 PT Time Calculation (min) (ACUTE ONLY): 23 min  Charges:                        [name], SPT    Cleta Heatley 01/02/2019, 3:34 PM

## 2019-01-02 NOTE — TOC Progression Note (Signed)
Transition of Care [facility]) - Progression Note    Patient Details  Name: Steven Welch MRN: [id number] Date of Birth: 1941-02-24  Transition of Care [id number]) CM/SW [place], RN Phone Number: 01/02/2019, 9:42 AM  Clinical Narrative:    Met with the patient in the room and provided him with the price of Lovenox at !50$, I also provided him with a good RX card that will make it $97 but will not use his insurance His wife will be helping him with his injections at home The DME has not been brought to the patient's room yet.  I notified Brad with [employer] and he stated that he will be bringing it this AM   Expected Discharge Plan: [place] Barriers to Discharge: Continued Medical Work up  Expected Discharge Plan and Services Expected Discharge Plan: [place]   Discharge Planning Services: CM Consult Post Acute Care Choice: [place] arrangements for the past 2 months: Single Family Home                 DME Arranged: 3-N-1, Walker rolling DME Agency: [employer] Date DME Agency Contacted: 01/01/19   Representative spoke with at DME Agency: [place]oy [place]: PT [place]: [facility] [employer]) Date [place]: 01/01/19 Time [place]: 1204 Representative spoke with at [place]: [place] ([place]) Interventions    Readmission Risk Interventions No flowsheet data found.

## 2019-01-02 NOTE — Progress Notes (Signed)
Physical Therapy Treatment Patient Details Name: Steven Welch MRN: [id number] DOB: 01-Feb-1941 Today's Date: 01/02/2019    History of Present Illness Pt is a 78 y/o M admitted s/p R THA to address OA. PMH includes HTN, GERD, HLD.    PT Comments    Pt is progressing toward goals. Upon entry, pt is seated up in bed eager to use urinal in standing position. Pt tends to move impulsively without considering line management. Reports minimal pain. This date, pt performs bed mobility, transfers, and ambulation with CGA/supervision with use of RW. Pt will continue to benefit from skilled PT to address deficits in strength, ROM, mobility, and pain management.    Follow Up Recommendations  Home health PT     Equipment Recommendations  Rolling walker with 5" wheels    Recommendations for Other Services       Precautions / Restrictions Precautions Precautions: Anterior Hip;Fall Precaution Booklet Issued: No Restrictions Weight Bearing Restrictions: Yes RLE Weight Bearing: Weight bearing as tolerated    Mobility  Bed Mobility Overal bed mobility: Needs Assistance Bed Mobility: Supine to Sit     Supine to sit: Min guard     General bed mobility comments: Pt able to perform bed mobility with min guard; pt relies on bedrails to bring trunk upright to EOB  Transfers Overall transfer level: Needs assistance Equipment used: Rolling walker (2 wheeled) Transfers: Sit to/from Stand Sit to Stand: Min guard         General transfer comment: Pt able to perform transfer with CGA and use of RW. Cueing provided for hand placement on walker/bed to prevent walker from falling. Pt was eager to stand to use urinal, tends to movie quickly and without considering line management.    Ambulation/Gait Ambulation/Gait assistance: Min guard Gait Distance (Feet): 80 Feet Assistive device: Rolling walker (2 wheeled) Gait Pattern/deviations: Step-to pattern;Antalgic     General Gait Details: Pt amb  quickly with antalgic pattern to nurse's station. CGA provided. Cueing required for pt to stay within walker, especially during turns. Pt tends to move quickly without considering line management   Stairs             Wheelchair Mobility    Modified Rankin (Stroke Patients Only)       Balance Overall balance assessment: Needs assistance Sitting-balance support: Feet supported Sitting balance-Leahy Scale: Fair Sitting balance - Comments: Pt able to attain upright posture, has slight posterior lean when no hand support.      Standing balance-Leahy Scale: Good Standing balance comment: Able to attain upright posture with RW                            Cognition Arousal/Alertness: Awake/alert Behavior During Therapy: WFL for tasks assessed/performed Overall Cognitive Status: Within Functional Limits for tasks assessed                                        Exercises Other Exercises Other Exercises: Supine, 12 reps, bilateral: AP, hip add/abd, GS, QS; PT supervision Other Exercises: Seated, 12 reps, bilateral: LAQ; PT supervision    General Comments General comments (skin integrity, edema, etc.): Pt moves slightly impulsively; was informed that he should not be getting up by himself yet.       Pertinent Vitals/Pain Pain Assessment: Faces Faces Pain Scale: Hurts a little bit Pain Location: R Hip  Pain Descriptors / Indicators: Dull;Discomfort Pain Intervention(s): Limited activity within patient's tolerance;Monitored during session;Repositioned    Home Living                      Prior Function            PT Goals (current goals can now be found in the care plan section) Acute Rehab PT Goals Patient Stated Goal: to return home PT Goal Formulation: With patient Time For Goal Achievement: 01/15/19 Potential to Achieve Goals: Good Progress towards PT goals: Progressing toward goals    Frequency    BID      PT Plan  Current plan remains appropriate    Co-evaluation              AM-PAC PT "6 Clicks" Mobility   Outcome Measure  Help needed turning from your back to your side while in a flat bed without using bedrails?: None Help needed moving from lying on your back to sitting on the side of a flat bed without using bedrails?: A Little Help needed moving to and from a bed to a chair (including a wheelchair)?: A Little Help needed standing up from a chair using your arms (e.g., wheelchair or bedside chair)?: A Little Help needed to walk in hospital room?: A Little Help needed climbing 3-5 steps with a railing? : A Little 6 Click Score: 19    End of Session Equipment Utilized During Treatment: Gait belt Activity Tolerance: Patient tolerated treatment well Patient left: in chair;with call bell/phone within reach;with chair alarm set;with SCD's reapplied;with family/visitor present   PT Visit Diagnosis: Muscle weakness (generalized) (M62.81);Pain;Difficulty in walking, not elsewhere classified (R26.2) Pain - Right/Left: Right Pain - part of body: Hip     Time: [id number] PT Time Calculation (min) (ACUTE ONLY): 25 min  Charges:                        [name], SPT     Steven Welch 01/02/2019, 11:30 AM

## 2019-01-02 NOTE — Progress Notes (Signed)
   Subjective: 1 Day Post-Op Procedure(s) (LRB): TOTAL HIP ARTHROPLASTY ANTERIOR APPROACH (Right) Patient reports pain as mild.   Patient is well, and has had no acute complaints or problems Denies any CP, SOB, ABD pain. We will continue therapy today.  Plan is to go Home after hospital stay.  Objective: Vital signs in last 24 hours: Temp:  [97.6 F (36.4 C)-98.8 F (37.1 C)] 98.6 F (37 C) (10/07 0749) Pulse Rate:  [71-94] 80 (10/07 0749) Resp:  [15-26] 18 (10/07 0749) BP: (110-144)/(64-79) 130/69 (10/07 0749) SpO2:  [93 %-100 %] 93 % (10/07 0749)  Intake/Output from previous day: 10/06 0701 - 10/07 0700 In: 3415.5 [P.O.:720; I.V.:2595.5; IV Piggyback:100] Out: 1610 [Urine:1410; Blood:200] Intake/Output this shift: No intake/output data recorded.  Recent Labs    01/01/19 1033 01/02/19 0459  HGB 12.9* 12.3*   Recent Labs    01/01/19 1033 01/02/19 0459  WBC 17.8* 11.1*  RBC 4.05* 3.82*  HCT 38.0* 35.4*  PLT 234 209   Recent Labs    01/01/19 1033 01/02/19 0459  NA  --  133*  K  --  3.9  CL  --  99  CO2  --  25  BUN  --  18  CREATININE 0.77 0.78  GLUCOSE  --  134*  CALCIUM  --  8.6*   No results for input(s): LABPT, INR in the last 72 hours.  EXAM General - Patient is Alert, Appropriate and Oriented Extremity - Neurovascular intact Sensation intact distally Intact pulses distally Dorsiflexion/Plantar flexion intact No cellulitis present Compartment soft Dressing - dressing C/D/I and no drainage, wound VAC intact without drainage Motor Function - intact, moving foot and toes well on exam.   Past Medical History:  Diagnosis Date  . Cyst of breast 1981   right  . GERD (gastroesophageal reflux disease)   . Hepatitis 1988  . History of shingles   . Hyperlipidemia   . Hypertension   . Osteoarthritis of right hip   . Skin cancer of face     Assessment/Plan:   1 Day Post-Op Procedure(s) (LRB): TOTAL HIP ARTHROPLASTY ANTERIOR APPROACH  (Right) Active Problems:   Status post total hip replacement, right  Estimated body mass index is 29 kg/m as calculated from the following:   Height as of this encounter: 5' 10.5" (1.791 m).   Weight as of this encounter: 93 kg. Advance diet Up with therapy  Needs bowel movement Labs are stable Vital signs are stable Encourage incentive spirometer Care management to assist with discharge to home with home health PT   DVT Prophylaxis - Lovenox, TED hose and SCDs Weight-Bearing as tolerated to right leg   T. Rachelle Hora, PA-C [place] 01/02/2019, 8:15 AM

## 2019-01-03 LAB — BASIC METABOLIC PANEL
Anion gap: 6 (ref 5–15)
BUN: 19 mg/dL (ref 8–23)
CO2: 27 mmol/L (ref 22–32)
Calcium: 8.5 mg/dL — ABNORMAL LOW (ref 8.9–10.3)
Chloride: 101 mmol/L (ref 98–111)
Creatinine, Ser: 0.83 mg/dL (ref 0.61–1.24)
GFR calc Af Amer: >60 mL/min (ref >60)
GFR calc non Af Amer: >60 mL/min (ref >60)
Glucose, Bld: 136 mg/dL — ABNORMAL HIGH (ref 70–99)
Potassium: 3.9 mmol/L (ref 3.5–5.1)
Sodium: 134 mmol/L — ABNORMAL LOW (ref 135–145)

## 2019-01-03 LAB — CBC
HCT: 31.9 % — ABNORMAL LOW (ref 39.0–52.0)
Hemoglobin: 11 g/dL — ABNORMAL LOW (ref 13.0–17.0)
MCH: 31.9 pg (ref 26.0–34.0)
MCHC: 34.5 g/dL (ref 30.0–36.0)
MCV: 92.5 fL (ref 80.0–100.0)
Platelets: 179 10*3/uL (ref 150–400)
RBC: 3.45 MIL/uL — ABNORMAL LOW (ref 4.22–5.81)
RDW: 12.2 % (ref 11.5–15.5)
WBC: 13.3 10*3/uL — ABNORMAL HIGH (ref 4.0–10.5)
nRBC: 0 % (ref 0.0–0.2)

## 2019-01-03 MED ORDER — OXYCODONE HCL 5 MG PO TABS: 5.0000 mg | ORAL_TABLET | ORAL | 0 refills | Status: DC | PRN

## 2019-01-03 MED ORDER — DOCUSATE SODIUM 100 MG PO CAPS: 100.0000 mg | ORAL_CAPSULE | Freq: Two times a day (BID) | ORAL | 0 refills | Status: DC

## 2019-01-03 MED ORDER — ENOXAPARIN SODIUM 40 MG/0.4ML ~~LOC~~ SOLN: 40.0000 mg | SUBCUTANEOUS | 0 refills | Status: DC

## 2019-01-03 MED ORDER — BISACODYL 10 MG RE SUPP
10.0000 mg | Freq: Once | RECTAL | Status: DC
  Filled 2019-01-03 (×2): qty 1

## 2019-01-03 MED ORDER — TAMSULOSIN HCL 0.4 MG PO CAPS: 0.4000 mg | ORAL_CAPSULE | Freq: Every day | ORAL | Status: DC

## 2019-01-03 NOTE — Progress Notes (Signed)
   Subjective: 2 Days Post-Op Procedure(s) (LRB): TOTAL HIP ARTHROPLASTY ANTERIOR APPROACH (Right) Patient reports pain as mild.   Patient is well, and has had no acute complaints or problems Denies any CP, SOB, ABD pain. We will continue therapy today.  Plan is to go Home after hospital stay.  Objective: Vital signs in last 24 hours: Temp:  [98 F (36.7 C)-99.1 F (37.3 C)] 98 F (36.7 C) (10/08 0739) Pulse Rate:  [77-88] 77 (10/08 0739) Resp:  [18-19] 18 (10/08 0739) BP: (113-141)/(63-75) 129/75 (10/08 0739) SpO2:  [95 %-97 %] 97 % (10/08 0739)  Intake/Output from previous day: 10/07 0701 - 10/08 0700 In: 1320 [P.O.:1320] Out: 1025 [Urine:1025] Intake/Output this shift: No intake/output data recorded.  Recent Labs    01/01/19 1033 01/02/19 0459 01/03/19 0533  HGB 12.9* 12.3* 11.0*   Recent Labs    01/02/19 0459 01/03/19 0533  WBC 11.1* 13.3*  RBC 3.82* 3.45*  HCT 35.4* 31.9*  PLT 209 179   Recent Labs    01/02/19 0459 01/03/19 0533  NA 133* 134*  K 3.9 3.9  CL 99 101  CO2 25 27  BUN 18 19  CREATININE 0.78 0.83  GLUCOSE 134* 136*  CALCIUM 8.6* 8.5*   No results for input(s): LABPT, INR in the last 72 hours.  EXAM General - Patient is Alert, Appropriate and Oriented Extremity - Neurovascular intact Sensation intact distally Intact pulses distally Dorsiflexion/Plantar flexion intact No cellulitis present Compartment soft Dressing - dressing C/D/I and no drainage, wound VAC intact without drainage Motor Function - intact, moving foot and toes well on exam.   Past Medical History:  Diagnosis Date  . Cyst of breast 1981   right  . GERD (gastroesophageal reflux disease)   . Hepatitis 1988  . History of shingles   . Hyperlipidemia   . Hypertension   . Osteoarthritis of right hip   . Skin cancer of face     Assessment/Plan:   2 Days Post-Op Procedure(s) (LRB): TOTAL HIP ARTHROPLASTY ANTERIOR APPROACH (Right) Active Problems:   Status  post total hip replacement, right  Estimated body mass index is 29 kg/m as calculated from the following:   Height as of this encounter: 5' 10.5" (1.791 m).   Weight as of this encounter: 93 kg. Advance diet Up with therapy  Remove foley Needs bowel movement Labs are stable Vital signs are stable Pain well controlled Encourage incentive spirometer Care management to assist with discharge to home with home health PT. possible discharge home today pending bowel movement, completion of PT goals and if patient is able to urinate on his own with no complications.   DVT Prophylaxis - Lovenox, TED hose and SCDs Weight-Bearing as tolerated to right leg   T. Rachelle Hora, PA-C [place] 01/03/2019, 8:52 AM

## 2019-01-03 NOTE — Plan of Care (Signed)
Pt progressing as expected. No complaints of pain or discomfort at this time. Re-educated on Tramadol due to pt asking questions.

## 2019-01-03 NOTE — TOC Progression Note (Signed)
Transition of Care [facility]) - Progression Note    Patient Details  Name: Iva Banducci MRN: [id number] Date of Birth: 07/17/1940  Transition of Care [facility]) CM/SW [place], RN Phone Number: 01/03/2019, 10:23 AM  Clinical Narrative:    Spoke with the patient's wife and she states that they do not need a walker, they have a brand new one she purchased at home, She wants to keep the [facility] for the patient.  The [place] Rile will be removed from the room and Brad with [employer] notiifed   Expected Discharge Plan: [place] Barriers to Discharge: Continued Medical Work up  Expected Discharge Plan and Services Expected Discharge Plan: [place]   Discharge Planning Services: CM Consult Post Acute Care Choice: [place] arrangements for the past 2 months: Single Family Home                 DME Arranged: 3-N-1, Walker rolling DME Agency: [employer] Date DME Agency Contacted: 01/01/19   Representative spoke with at DME Agency: [place]: PT [place]: [facility] [employer] Date [place]: 01/01/19 Time [place]: 1204 Representative spoke with at [place]: Isabel ([place]) Interventions    Readmission Risk Interventions No flowsheet data found.

## 2019-01-03 NOTE — Progress Notes (Signed)
Physical Therapy Treatment Patient Details Name: Steven Welch MRN: [id number] DOB: 1941/01/31 Today's Date: 01/03/2019    History of Present Illness Pt is a 77 y/o M admitted s/p R THA to address OA. PMH includes HTN, GERD, HLD.    PT Comments    Pt is progressing toward goals. Upon entry, pt is getting foley removed by RN; reports minimal pain. This date, pt performs bed mobility, transfers, and ambulation with CGA and use of RW. Pt continues to move impulsively and requires cueing with all activities to remain within frame of walker and slow down. Pt successfully performed stair practice. Pt will continue to benefit from skilled PT to address deficits in strength/ROM/mobility.    Follow Up Recommendations  Home health PT     Equipment Recommendations  Rolling walker with 5" wheels    Recommendations for Other Services       Precautions / Restrictions Precautions Precautions: Anterior Hip;Fall Precaution Booklet Issued: Yes (comment) Restrictions Weight Bearing Restrictions: Yes RLE Weight Bearing: Weight bearing as tolerated    Mobility  Bed Mobility Overal bed mobility: Needs Assistance Bed Mobility: Supine to Sit     Supine to sit: Supervision     General bed mobility comments: able to perform bed mobility with supervision and use of bedrails to reach EOB  Transfers Overall transfer level: Needs assistance Equipment used: Rolling walker (2 wheeled) Transfers: Sit to/from Stand Sit to Stand: Min guard         General transfer comment: min guard applied for safety due to impulsivity. pt reminded of hand placement when standing  Ambulation/Gait Ambulation/Gait assistance: Min guard Gait Distance (Feet): 180 Feet Assistive device: Rolling walker (2 wheeled) Gait Pattern/deviations: Step-to pattern;Antalgic     General Gait Details: Pt instructed several times to slow down and remain within walker to reduce risk of falls and place adequate weight on  BLE's.   Stairs Stairs: Yes Stairs assistance: Min guard Stair Management: Two rails;No rails Number of Stairs: 4 General stair comments: PT provided demonstration prior to attempt. Pt cued several times to slow down. able to ascend/descend stairs backward with walker and with rails.    Wheelchair Mobility    Modified Rankin (Stroke Patients Only)       Balance Overall balance assessment: Needs assistance Sitting-balance support: Feet supported Sitting balance-Leahy Scale: Good     Standing balance support: Bilateral upper extremity supported Standing balance-Leahy Scale: Good Standing balance comment: relies heavily on RW during amb                            Cognition Arousal/Alertness: Awake/alert Behavior During Therapy: WFL for tasks assessed/performed Overall Cognitive Status: Within Functional Limits for tasks assessed                                 General Comments: Pt requires continuous cueing to remain within the walker and slow down      Exercises Other Exercises Other Exercises: Supine, 12 reps, bilateral: AP, QS; PT supervision    General Comments General comments (skin integrity, edema, etc.): Pt continues to move impulsively and attempts to move w/o walker. Pt instructed to move slowly and remain within the frame of the walker at all times      Pertinent Vitals/Pain Pain Assessment: Faces Faces Pain Scale: Hurts a little bit Pain Location: R Hip Pain Descriptors / Indicators: Dull;Discomfort Pain Intervention(s):  Limited activity within patient's tolerance;Monitored during session;Repositioned    Home Living                      Prior Function            PT Goals (current goals can now be found in the care plan section) Acute Rehab PT Goals Patient Stated Goal: to return home PT Goal Formulation: With patient Time For Goal Achievement: 01/15/19 Potential to Achieve Goals: Good Progress towards PT  goals: Progressing toward goals    Frequency    BID      PT Plan Current plan remains appropriate    Co-evaluation              AM-PAC PT "6 Clicks" Mobility   Outcome Measure  Help needed turning from your back to your side while in a flat bed without using bedrails?: None Help needed moving from lying on your back to sitting on the side of a flat bed without using bedrails?: A Little Help needed moving to and from a bed to a chair (including a wheelchair)?: A Little Help needed standing up from a chair using your arms (e.g., wheelchair or bedside chair)?: A Little Help needed to walk in hospital room?: A Little Help needed climbing 3-5 steps with a railing? : A Little 6 Click Score: 19    End of Session Equipment Utilized During Treatment: Gait belt Activity Tolerance: Patient tolerated treatment well Patient left: in chair;with call bell/phone within reach;with chair alarm set;with SCD's reapplied;with family/visitor present Nurse Communication: Mobility status PT Visit Diagnosis: Muscle weakness (generalized) (M62.81);Pain;Difficulty in walking, not elsewhere classified (R26.2) Pain - Right/Left: Right Pain - part of body: Hip     Time: [id number] PT Time Calculation (min) (ACUTE ONLY): 32 min  Charges:                        [name], SPT    Jaxston Chohan 01/03/2019, 10:55 AM

## 2019-01-03 NOTE — Discharge Summary (Signed)
Physician Discharge Summary  Patient ID: Steven Welch MRN: [id number] DOB/AGE: 1941-02-21 78 y.o.  Admit date: 01/01/2019 Discharge date: 01/04/2019 Admission Diagnoses:  PRIMARY OSTEOARTHRITIS OF RIGHT HIP   Discharge Diagnoses: Patient Active Problem List   Diagnosis Date Noted  . Status post total hip replacement, 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    Past Medical History:  Diagnosis Date  . Cyst of breast 1981   right  . GERD (gastroesophageal reflux disease)   . Hepatitis 1988  . History of shingles   . Hyperlipidemia   . Hypertension   . Osteoarthritis of right hip   . Skin cancer of face      Transfusion: none   Consultants (if any):   Discharged Condition: Improved  Hospital Course: Steven Welch is an 78 y.o. male who was admitted 01/01/2019 with a diagnosis of right hip osteoarthritis and went to the operating room on 01/01/2019 and underwent the above named procedures.    Surgeries: Procedure(s): TOTAL HIP ARTHROPLASTY ANTERIOR APPROACH on 01/01/2019 Patient tolerated the surgery well. Taken to PACU where she was stabilized and then transferred to the orthopedic floor.  Started on Lovenox 40 mg q 24 hrs. Foot pumps applied bilaterally at 80 mm. Heels elevated on bed with rolled towels. No evidence of DVT. Negative [place]. Physical therapy started on day #1 for gait training and transfer. OT started day #1 for ADL and assisted devices.  Patient's foley was d/c on day #1.  Later on postop day 1, patient unable to void.  Foley was reinserted.  Patient's IV was d/c on day #2.  Patient Foley removed for the second time on postop day 2 and later that night on postop day 2 patient able to void with no issues.  On post op day #3 patient was stable and ready for discharge to home with HHPT.  Patient voiding on his own with no complications.  Implants: Medacta AMIS 6 standard stem with M 28 mm metal head, 60 mm mPACT DM cup and liner  He was given perioperative antibiotics:  Anti-infectives  (From admission, onward)   Start     Dose/Rate Route Frequency Ordered Stop   01/01/19 1330  ceFAZolin (ANCEF) IVPB 2g/100 mL premix     2 g 200 mL/hr over 30 Minutes Intravenous Every 6 hours 01/01/19 0944 01/01/19 1935   01/01/19 0620  ceFAZolin (ANCEF) 2-4 GM/100ML-% IVPB    Note to Pharmacy: Myles Lipps   : cabinet override      01/01/19 0620 01/01/19 1829   01/01/19 0115  ceFAZolin (ANCEF) IVPB 2g/100 mL premix     2 g 200 mL/hr over 30 Minutes Intravenous  Once 01/01/19 0106 01/01/19 0803    .  He was given sequential compression devices, early ambulation, and Lovenox, teds for DVT prophylaxis.  He benefited maximally from the hospital stay and there were no complications.    Recent vital signs:  Vitals:   01/04/19 0012 01/04/19 0804  BP: 135/65 115/65  Pulse: 77 71  Resp: 18   Temp: 98.2 F (36.8 C) 98.2 F (36.8 C)  SpO2: 98% 95%    Recent laboratory studies:  Lab Results  Component Value Date   HGB 11.0 (L) 01/03/2019   HGB 12.3 (L) 01/02/2019   HGB 12.9 (L) 01/01/2019   Lab Results  Component Value Date   WBC 13.3 (H) 01/03/2019   PLT 179 01/03/2019   Lab Results  Component Value Date   INR 1.0 02/12/2014   Lab Results  Component Value Date  NA 134 (L) 01/03/2019   K 3.9 01/03/2019   CL 101 01/03/2019   CO2 27 01/03/2019   BUN 19 01/03/2019   CREATININE 0.83 01/03/2019   GLUCOSE 136 (H) 01/03/2019    Discharge Medications:   Allergies as of 01/04/2019   No Known Allergies     Medication List    TAKE these medications   acetaminophen 650 MG CR tablet Commonly known as: TYLENOL Take 650 mg by mouth 2 (two) times daily.   amLODipine 10 MG tablet Commonly known as: NORVASC Take 10 mg by mouth daily.   aspirin EC 81 MG tablet Take 81 mg by mouth daily.   docusate sodium 100 MG capsule Commonly known as: COLACE Take 1 capsule (100 mg total) by mouth 2 (two) times daily.   enoxaparin 40 MG/0.4ML injection Commonly known as:  LOVENOX Inject 0.4 mLs (40 mg total) into the skin daily for 14 days.   lisinopril-hydrochlorothiazide 20-12.5 MG tablet Commonly known as: ZESTORETIC Take 2 tablets by mouth daily.   omeprazole 20 MG tablet Commonly known as: PRILOSEC OTC Take 20 mg by mouth daily.   oxyCODONE 5 MG immediate release tablet Commonly known as: Oxy IR/ROXICODONE Take 1-2 tablets (5-10 mg total) by mouth every 4 (four) hours as needed for moderate pain (pain score 4-6).   oxymetazoline 0.05 % nasal spray Commonly known as: AFRIN Place 1 spray into both nostrils at bedtime.   zolpidem 5 MG tablet Commonly known as: AMBIEN Take 5 mg by mouth at bedtime as needed for sleep.            Durable Medical Equipment  (From admission, onward)         Start     Ordered   01/01/19 0944  DME Walker rolling  Once    Question:  Patient needs a walker to treat with the following condition  Answer:  Status post total hip replacement, right   01/01/19 0944   01/01/19 0944  DME Bedside commode  Once    Question:  Patient needs a bedside commode to treat with the following condition  Answer:  Status post total hip replacement, right   01/01/19 0944   01/01/19 0944  DME 3 n 1  Once     01/01/19 0944          Diagnostic Studies: Dg Hip Operative Unilat W Or W/o Pelvis Right  Result Date: 01/01/2019 CLINICAL DATA:  Post right hip replacement. EXAM: DG HIP (WITH OR WITHOUT PELVIS) 2-3V RIGHT; OPERATIVE RIGHT HIP WITH PELVIS COMPARISON:  None. FINDINGS: Initial intraoperative images demonstrate degenerative changes of the right hip. Subsequent placement of right total hip arthroplasty intact and normally located. Subsequent postoperative images demonstrate evidence of recent right total hip arthroplasty in adequate position and intact. Skin staples over the associated soft tissues. IMPRESSION: Expected changes post right total hip arthroplasty. Electronically Signed   By: Marin Olp M.D.   On: 01/01/2019  09:35   Dg Hip Unilat W Or W/o Pelvis 2-3 Views Right  Result Date: 01/01/2019 CLINICAL DATA:  Post right hip replacement. EXAM: DG HIP (WITH OR WITHOUT PELVIS) 2-3V RIGHT; OPERATIVE RIGHT HIP WITH PELVIS COMPARISON:  None. FINDINGS: Initial intraoperative images demonstrate degenerative changes of the right hip. Subsequent placement of right total hip arthroplasty intact and normally located. Subsequent postoperative images demonstrate evidence of recent right total hip arthroplasty in adequate position and intact. Skin staples over the associated soft tissues. IMPRESSION: Expected changes post right total hip arthroplasty.  Electronically Signed   By: Marin Olp M.D.   On: 01/01/2019 09:35    Disposition: Discharge disposition: 01-Home or Self Care         Follow-up Information    Duanne Guess, PA-C On 01/17/2019.   Specialties: Orthopedic Surgery, Emergency Medicine Why: @ 2:15 pm Contact information: [place] [address] [phone]            Signed: Feliberto Gottron 01/04/2019, 8:25 AM

## 2019-01-03 NOTE — Progress Notes (Signed)
OT Cancellation Note  Patient Details Name: Steven Welch MRN: [id number] DOB: Mar 03, 1941   Cancelled Treatment:    Reason Eval/Treat Not Completed: Patient declined, no reason specified. Pt with RN for bladder scan. Spouse present. Pt/spouse deny additional skilled OT needs at this time. Educated spouse in where to purchase a reacher, per her request. Otherwise, no needs. Pt./spouse hoping to discharge soon.   Jeni Salles, MPH, MS, OTR/L ascom [phone] 01/03/19, 4:16 PM

## 2019-01-03 NOTE — Discharge Instructions (Signed)
ANTERIOR APPROACH TOTAL HIP REPLACEMENT POSTOPERATIVE DIRECTIONS   Hip Rehabilitation, Guidelines Following Surgery  The results of a hip operation are greatly improved after range of motion and muscle strengthening exercises. Follow all safety measures which are given to protect your hip. If any of these exercises cause increased pain or swelling in your joint, decrease the amount until you are comfortable again. Then slowly increase the exercises. Call your caregiver if you have problems or questions.   HOME CARE INSTRUCTIONS  Remove items at home which could result in a fall. This includes throw rugs or furniture in walking pathways.   ICE to the affected hip every three hours for 30 minutes at a time and then as needed for pain and swelling.  Continue to use ice on the hip for pain and swelling from surgery. You may notice swelling that will progress down to the foot and ankle.  This is normal after surgery.  Elevate the leg when you are not up walking on it.    Continue to use the breathing machine which will help keep your temperature down.  It is common for your temperature to cycle up and down following surgery, especially at night when you are not up moving around and exerting yourself.  The breathing machine keeps your lungs expanded and your temperature down.  Do not place pillow under knee, focus on keeping the knee straight while resting  DIET You may resume your previous home diet once your are discharged from the hospital.  DRESSING / WOUND CARE / SHOWERING Please remove provena negative pressure dressing on 01/10/2019  and apply honey comb dressing. Keep dressing clean and dry at all times.  ACTIVITY Walk with your walker as instructed. Use walker as long as suggested by your caregivers. Avoid periods of inactivity such as sitting longer than an hour when not asleep. This helps prevent blood clots.  You may resume a sexual relationship in one month or when given the OK by  your doctor.  You may return to work once you are cleared by your doctor.  Do not drive a car for 6 weeks or until released by you surgeon.  Do not drive while taking narcotics.  WEIGHT BEARING Weight bearing as tolerated. Use walker/cane as needed for at least 4 weeks post op.  POSTOPERATIVE CONSTIPATION PROTOCOL Constipation - defined medically as fewer than three stools per week and severe constipation as less than one stool per week.  One of the most common issues patients have following surgery is constipation.  Even if you have a regular bowel pattern at home, your normal regimen is likely to be disrupted due to multiple reasons following surgery.  Combination of anesthesia, postoperative narcotics, change in appetite and fluid intake all can affect your bowels.  In order to avoid complications following surgery, here are some recommendations in order to help you during your recovery period.  Colace (docusate) - Pick up an over-the-counter form of Colace or another stool softener and take twice a day as long as you are requiring postoperative pain medications.  Take with a full glass of water daily.  If you experience loose stools or diarrhea, hold the colace until you stool forms back up.  If your symptoms do not get better within 1 week or if they get worse, check with your doctor.  Dulcolax (bisacodyl) - Pick up over-the-counter and take as directed by the product packaging as needed to assist with the movement of your bowels.  Take with a  full glass of water.  Use this product as needed if not relieved by Colace only.  ° °MiraLax (polyethylene glycol) - Pick up over-the-counter to have on hand.  MiraLax is a solution that will increase the amount of water in your bowels to assist with bowel movements.  Take as directed and can mix with a glass of water, juice, soda, coffee, or tea.  Take if you go more than two days without a movement. °Do not use MiraLax more than once per day. Call your  doctor if you are still constipated or irregular after using this medication for 7 days in a row. ° °If you continue to have problems with postoperative constipation, please contact the office for further assistance and recommendations.  If you experience "the worst abdominal pain ever" or develop nausea or vomiting, please contact the office immediatly for further recommendations for treatment. ° °ITCHING ° If you experience itching with your medications, try taking only a single pain pill, or even half a pain pill at a time.  You can also use Benadryl over the counter for itching or also to help with sleep.  ° °TED HOSE STOCKINGS °Wear the elastic stockings on both legs for six weeks following surgery during the day but you may remove then at night for sleeping. ° °MEDICATIONS °See your medication summary on the “After Visit Summary” that the nursing staff will review with you prior to discharge.  You may have some home medications which will be placed on hold until you complete the course of blood thinner medication.  It is important for you to complete the blood thinner medication as prescribed by your surgeon.  Continue your approved medications as instructed at time of discharge. ° °PRECAUTIONS °If you experience chest pain or shortness of breath - call 911 immediately for transfer to the hospital emergency department.  °If you develop a fever greater that 101 F, purulent drainage from wound, increased redness or drainage from wound, foul odor from the wound/dressing, or calf pain - CONTACT YOUR SURGEON.   °                                                °FOLLOW-UP APPOINTMENTS °Make sure you keep all of your appointments after your operation with your surgeon and caregivers. You should call the office at the above phone number and make an appointment for approximately two weeks after the date of your surgery or on the date instructed by your surgeon outlined in the "After Visit Summary". ° °RANGE OF MOTION  AND STRENGTHENING EXERCISES  °These exercises are designed to help you keep full movement of your hip joint. Follow your caregiver's or physical therapist's instructions. Perform all exercises about fifteen times, three times per day or as directed. Exercise both hips, even if you have had only one joint replacement. These exercises can be done on a training (exercise) mat, on the floor, on a table or on a bed. Use whatever works the best and is most comfortable for you. Use music or television while you are exercising so that the exercises are a pleasant break in your day. This will make your life better with the exercises acting as a break in routine you can look forward to.  °Lying on your back, slowly slide your foot toward your buttocks, raising your knee up off the floor. Then   slowly slide your foot back down until your leg is straight again.  °Lying on your back spread your legs as far apart as you can without causing discomfort.  °Lying on your side, raise your upper leg and foot straight up from the floor as far as is comfortable. Slowly lower the leg and repeat.  °Lying on your back, tighten up the muscle in the front of your thigh (quadriceps muscles). You can do this by keeping your leg straight and trying to raise your heel off the floor. This helps strengthen the largest muscle supporting your knee.  °Lying on your back, tighten up the muscles of your buttocks both with the legs straight and with the knee bent at a comfortable angle while keeping your heel on the floor.  ° °IF YOU ARE TRANSFERRED TO A SKILLED REHAB FACILITY °If the patient is transferred to a skilled rehab facility following release from the hospital, a list of the current medications will be sent to the facility for the patient to continue.  When discharged from the skilled rehab facility, please have the facility set up the patient's Home Health Physical Therapy prior to being released. Also, the skilled facility will be responsible  for providing the patient with their medications at time of release from the facility to include their pain medication, the muscle relaxants, and their blood thinner medication. If the patient is still at the rehab facility at time of the two week follow up appointment, the skilled rehab facility will also need to assist the patient in arranging follow up appointment in our office and any transportation needs. ° °MAKE SURE YOU:  °Understand these instructions.  °Get help right away if you are not doing well or get worse.  ° ° °Pick up stool softner and laxative for home use following surgery while on pain medications. °Continue to use ice for pain and swelling after surgery. °Do not use any lotions or creams on the incision until instructed by your surgeon. ° °

## 2019-01-04 NOTE — Progress Notes (Signed)
Education done this shift on bladder scanning and urination.

## 2019-01-04 NOTE — Progress Notes (Signed)
   Subjective: 3 Days Post-Op Procedure(s) (LRB): TOTAL HIP ARTHROPLASTY ANTERIOR APPROACH (Right) Patient reports pain as mild.   Patient is well, and has had no acute complaints or problems.  Patient able to urinate on his own last night and this morning.  No trouble with flow. Denies any CP, SOB, ABD pain. We will continue therapy today.  Plan is to go Home after hospital stay.  Objective: Vital signs in last 24 hours: Temp:  [98.2 F (36.8 C)-98.3 F (36.8 C)] 98.2 F (36.8 C) (10/09 0804) Pulse Rate:  [71-77] 71 (10/09 0804) Resp:  [18] 18 (10/09 0012) BP: (115-135)/(65-69) 115/65 (10/09 0804) SpO2:  [95 %-98 %] 95 % (10/09 0804)  Intake/Output from previous day: 10/08 0701 - 10/09 0700 In: 937 [I.V.:937] Out: 1200 [Urine:1200] Intake/Output this shift: Total I/O In: -  Out: 150 [Urine:150]  Recent Labs    01/01/19 1033 01/02/19 0459 01/03/19 0533  HGB 12.9* 12.3* 11.0*   Recent Labs    01/02/19 0459 01/03/19 0533  WBC 11.1* 13.3*  RBC 3.82* 3.45*  HCT 35.4* 31.9*  PLT 209 179   Recent Labs    01/02/19 0459 01/03/19 0533  NA 133* 134*  K 3.9 3.9  CL 99 101  CO2 25 27  BUN 18 19  CREATININE 0.78 0.83  GLUCOSE 134* 136*  CALCIUM 8.6* 8.5*   No results for input(s): LABPT, INR in the last 72 hours.  EXAM General - Patient is Alert, Appropriate and Oriented Extremity - Neurovascular intact Sensation intact distally Intact pulses distally Dorsiflexion/Plantar flexion intact No cellulitis present Compartment soft Dressing - dressing C/D/I and no drainage, wound VAC intact without drainage Motor Function - intact, moving foot and toes well on exam.   Past Medical History:  Diagnosis Date  . Cyst of breast 1981   right  . GERD (gastroesophageal reflux disease)   . Hepatitis 1988  . History of shingles   . Hyperlipidemia   . Hypertension   . Osteoarthritis of right hip   . Skin cancer of face     Assessment/Plan:   3 Days Post-Op  Procedure(s) (LRB): TOTAL HIP ARTHROPLASTY ANTERIOR APPROACH (Right) Active Problems:   Status post total hip replacement, right  Estimated body mass index is 29 kg/m as calculated from the following:   Height as of this encounter: 5' 10.5" (1.791 m).   Weight as of this encounter: 93 kg. Advance diet Up with therapy  Labs are stable Vital signs are stable Pain well controlled Encourage incentive spirometer Care management to assist with discharge to home with home health PT today after physical therapy.   DVT Prophylaxis - Lovenox, TED hose and SCDs Weight-Bearing as tolerated to right leg   T. Rachelle Hora, PA-C [place] 01/04/2019, 8:23 AM

## 2019-01-04 NOTE — Progress Notes (Signed)
Pt had bladder scan done with 100 ml scanned. Later pt was able to urinate using urinal without difficulty. Pdowless, rn

## 2019-01-04 NOTE — Progress Notes (Signed)
Patient discharged home. IV removed. Wound vac changed to Prevena. instructions given to patient and wife and all questions answered. prescriptions given to patient. Pt escorted out via wheelchair by ortho staff to private vehicle.

## 2019-01-04 NOTE — TOC Transition Note (Signed)
Transition of Care [facility]) - CM/SW Discharge Note   Patient Details  Name: Steven Welch MRN: [id number] Date of Birth: Dec 30, 1940  [facility]) CM/SW Contact:  Su Hilt, RN Phone Number: 01/04/2019, 10:13 AM   Clinical Narrative:    Patient to discharge home with his wife.  Wife to provide transportation The price of Lovenox $150 has been given to the patient.  I provided him with a [employer] card to use if he wishes the cost will be $97 at [place] He has the [facility] in the room and states that he does not need the RW He is walking 180 ft with PT and has been set up with [facility] additional needs  Final next level of care: Home w Home Health Services Barriers to Discharge: Barriers Resolved   Patient Goals and CMS Choice Patient states their goals for this hospitalization and ongoing recovery are:: go home CMS Medicare.gov Compare Post Acute Care list provided to:: Patient Choice offered to / list presented to : Patient  Discharge Placement                       Discharge Plan and Services   Discharge Planning Services: CM Consult Post Acute Care Choice: Home Health          DME Arranged: 3-N-1, Walker rolling DME Agency: [employer] Date DME Agency Contacted: 01/01/19   Representative spoke with at DME Agency: [place]: PT [place]: [facility] [employer] Date [place]: 01/01/19 Time [place]: 1204 Representative spoke with at [place]: [place] ([place]) Interventions     Readmission Risk Interventions No flowsheet data found.

## 2019-01-04 NOTE — Care Management Important Message (Signed)
Important Message  Patient Details  Name: Steven Welch MRN: [id number] Date of Birth: 04/16/1940   Medicare Important Message Given:  Yes     Dannette Barbara 01/04/2019, 11:02 AM

## 2019-01-04 NOTE — Progress Notes (Signed)
Physical Therapy Treatment Patient Details Name: Steven Welch MRN: [id number] DOB: 01-16-1941 Today's Date: 01/04/2019    History of Present Illness Pt is a 78 y/o M admitted s/p R THA to address OA. PMH includes HTN, GERD, HLD.    PT Comments    Pt is progressing toward goals. Upon entry, pt is resting in bed stating he is without pain. This date, pt performs bed mobility with min assist from PT from flat position, and transfers/ambulation with CGA and use of RW. Pt able to perform seated balance activity successfully. Pt will continue to benefit from skilled PT to address deficits in strength/ROM/mobility.     Follow Up Recommendations  Home health PT     Equipment Recommendations  None recommended by PT    Recommendations for Other Services       Precautions / Restrictions Precautions Precautions: Anterior Hip;Fall Precaution Booklet Issued: Yes (comment) Restrictions Weight Bearing Restrictions: Yes RLE Weight Bearing: Weight bearing as tolerated    Mobility  Bed Mobility Overal bed mobility: Needs Assistance Bed Mobility: Supine to Sit     Supine to sit: Min assist     General bed mobility comments: Attempted bed mobility with bed lowered and flat. Pt able to move LE's off edge of bed, but requires handheld assist to reach upright position at EOB  Transfers Overall transfer level: Needs assistance Equipment used: Rolling walker (2 wheeled) Transfers: Sit to/from Stand Sit to Stand: Supervision         General transfer comment: able to perform with supervision, cueing for hand placement on walker  Ambulation/Gait Ambulation/Gait assistance: Min guard Gait Distance (Feet): 200 Feet Assistive device: Rolling walker (2 wheeled) Gait Pattern/deviations: Step-to pattern;Antalgic     General Gait Details: Pt instructed to slow down and remain within frame of walker when turning. Pt also instructed to even out weight distribution as much as possible and  decrease reliance on UE.    Stairs             Wheelchair Mobility    Modified Rankin (Stroke Patients Only)       Balance Overall balance assessment: Needs assistance Sitting-balance support: Feet supported Sitting balance-Leahy Scale: Good     Standing balance support: Bilateral upper extremity supported Standing balance-Leahy Scale: Good Standing balance comment: relies heavily on RW during amb                            Cognition Arousal/Alertness: Awake/alert Behavior During Therapy: WFL for tasks assessed/performed Overall Cognitive Status: Within Functional Limits for tasks assessed                                 General Comments: Pt requires less cueing today when amb. Still tends to try and get rid of walker when turning       Exercises Other Exercises Other Exercises: Seated EOB: functional reaching while pt maintains upright balance    General Comments General comments (skin integrity, edema, etc.): Pt still displays impulsivitiy when performing turns      Pertinent Vitals/Pain Pain Assessment: No/denies pain Pain Intervention(s): Monitored during session    Home Living                      Prior Function            PT Goals (current goals can now be found in  the care plan section) Acute Rehab PT Goals Patient Stated Goal: to return home PT Goal Formulation: With patient Time For Goal Achievement: 01/15/19 Potential to Achieve Goals: Good Progress towards PT goals: Progressing toward goals    Frequency    BID      PT Plan Current plan remains appropriate    Co-evaluation              AM-PAC PT "6 Clicks" Mobility   Outcome Measure  Help needed turning from your back to your side while in a flat bed without using bedrails?: None Help needed moving from lying on your back to sitting on the side of a flat bed without using bedrails?: A Little Help needed moving to and from a bed to a  chair (including a wheelchair)?: A Little Help needed standing up from a chair using your arms (e.g., wheelchair or bedside chair)?: A Little Help needed to walk in hospital room?: A Little Help needed climbing 3-5 steps with a railing? : A Little 6 Click Score: 19    End of Session Equipment Utilized During Treatment: Gait belt Activity Tolerance: Patient tolerated treatment well Patient left: in chair;with call bell/phone within reach;with chair alarm set;with SCD's reapplied Nurse Communication: Mobility status PT Visit Diagnosis: Muscle weakness (generalized) (M62.81);Pain;Difficulty in walking, not elsewhere classified (R26.2) Pain - Right/Left: Right Pain - part of body: Hip     Time: 0912-0935 PT Time Calculation (min) (ACUTE ONLY): 23 min  Charges:                        [name], SPT    Lynnie Koehler 01/04/2019, 10:24 AM

## 2019-02-12 ENCOUNTER — Other Ambulatory Visit: Payer: Self-pay

## 2019-02-12 ENCOUNTER — Ambulatory Visit (LOCAL_COMMUNITY_HEALTH_CENTER): Payer: Medicare Other

## 2019-02-12 ENCOUNTER — Ambulatory Visit: Payer: Self-pay

## 2019-02-12 DIAGNOSIS — Z23 Encounter for immunization: Secondary | ICD-10-CM

## 2019-02-12 NOTE — Progress Notes (Signed)
Pt to clinic requesting pneumonia and flu vaccines. Pt states he received a pneumonia vaccine about 1 year ago, does not know the exact date and does not know if he received Prevnar 13 or Pneumovax 23. Pt states he will go back to the place that administered his pnuemonia vaccine and get a copy of the record. Administered influenza vaccine only today.

## 2021-01-07 ENCOUNTER — Inpatient Hospital Stay
Admission: EM | Admit: 2021-01-07 | Discharge: 2021-01-11 | DRG: 872 | Disposition: A | Discharge status: Home / Self Care | Payer: Medicare Other | Attending: Internal Medicine | Admitting: Internal Medicine

## 2021-01-07 ENCOUNTER — Inpatient Hospital Stay: Payer: Medicare Other

## 2021-01-07 ENCOUNTER — Other Ambulatory Visit: Payer: Self-pay

## 2021-01-07 ENCOUNTER — Emergency Department: Payer: Medicare Other

## 2021-01-07 DIAGNOSIS — I4819 Other persistent atrial fibrillation: Secondary | ICD-10-CM | POA: Diagnosis present

## 2021-01-07 DIAGNOSIS — Z8249 Family history of ischemic heart disease and other diseases of the circulatory system: Secondary | ICD-10-CM

## 2021-01-07 DIAGNOSIS — K122 Cellulitis and abscess of mouth: Secondary | ICD-10-CM | POA: Diagnosis present

## 2021-01-07 DIAGNOSIS — I4891 Unspecified atrial fibrillation: Secondary | ICD-10-CM | POA: Diagnosis present

## 2021-01-07 DIAGNOSIS — M170 Bilateral primary osteoarthritis of knee: Secondary | ICD-10-CM | POA: Diagnosis present

## 2021-01-07 DIAGNOSIS — Z85828 Personal history of other malignant neoplasm of skin: Secondary | ICD-10-CM

## 2021-01-07 DIAGNOSIS — I10 Essential (primary) hypertension: Secondary | ICD-10-CM | POA: Diagnosis present

## 2021-01-07 DIAGNOSIS — K219 Gastro-esophageal reflux disease without esophagitis: Secondary | ICD-10-CM | POA: Diagnosis present

## 2021-01-07 DIAGNOSIS — K029 Dental caries, unspecified: Secondary | ICD-10-CM | POA: Diagnosis present

## 2021-01-07 DIAGNOSIS — K047 Periapical abscess without sinus: Secondary | ICD-10-CM | POA: Diagnosis present

## 2021-01-07 DIAGNOSIS — Z7982 Long term (current) use of aspirin: Secondary | ICD-10-CM | POA: Diagnosis not present

## 2021-01-07 DIAGNOSIS — A419 Sepsis, unspecified organism: Principal | ICD-10-CM | POA: Diagnosis present

## 2021-01-07 DIAGNOSIS — Z96643 Presence of artificial hip joint, bilateral: Secondary | ICD-10-CM | POA: Diagnosis present

## 2021-01-07 DIAGNOSIS — E876 Hypokalemia: Secondary | ICD-10-CM | POA: Diagnosis present

## 2021-01-07 DIAGNOSIS — L03211 Cellulitis of face: Secondary | ICD-10-CM | POA: Diagnosis not present

## 2021-01-07 DIAGNOSIS — E785 Hyperlipidemia, unspecified: Secondary | ICD-10-CM | POA: Diagnosis present

## 2021-01-07 DIAGNOSIS — Z79899 Other long term (current) drug therapy: Secondary | ICD-10-CM | POA: Diagnosis not present

## 2021-01-07 DIAGNOSIS — R9431 Abnormal electrocardiogram [ECG] [EKG]: Secondary | ICD-10-CM | POA: Diagnosis not present

## 2021-01-07 DIAGNOSIS — L0201 Cutaneous abscess of face: Secondary | ICD-10-CM | POA: Diagnosis present

## 2021-01-07 DIAGNOSIS — R531 Weakness: Secondary | ICD-10-CM | POA: Diagnosis not present

## 2021-01-07 DIAGNOSIS — Z20822 Contact with and (suspected) exposure to covid-19: Secondary | ICD-10-CM | POA: Diagnosis present

## 2021-01-07 DIAGNOSIS — L0390 Cellulitis, unspecified: Secondary | ICD-10-CM | POA: Diagnosis not present

## 2021-01-07 LAB — CBC WITH DIFFERENTIAL/PLATELET
Abs Immature Granulocytes: 0.2 10*3/uL — ABNORMAL HIGH (ref 0.00–0.07)
Basophils Absolute: 0 10*3/uL (ref 0.0–0.1)
Basophils Relative: 0 %
Eosinophils Absolute: 0 10*3/uL (ref 0.0–0.5)
Eosinophils Relative: 0 %
HCT: 40.2 % (ref 39.0–52.0)
Hemoglobin: 14.2 g/dL (ref 13.0–17.0)
Immature Granulocytes: 1 %
Lymphocytes Relative: 5 %
Lymphs Abs: 0.8 10*3/uL (ref 0.7–4.0)
MCH: 34.1 pg — ABNORMAL HIGH (ref 26.0–34.0)
MCHC: 35.3 g/dL (ref 30.0–36.0)
MCV: 96.6 fL (ref 80.0–100.0)
Monocytes Absolute: 1.3 10*3/uL — ABNORMAL HIGH (ref 0.1–1.0)
Monocytes Relative: 9 %
Neutro Abs: 12.8 10*3/uL — ABNORMAL HIGH (ref 1.7–7.7)
Neutrophils Relative %: 85 %
Platelets: 208 10*3/uL (ref 150–400)
RBC: 4.16 MIL/uL — ABNORMAL LOW (ref 4.22–5.81)
RDW: 13.9 % (ref 11.5–15.5)
WBC: 15.2 10*3/uL — ABNORMAL HIGH (ref 4.0–10.5)
nRBC: 0 % (ref 0.0–0.2)

## 2021-01-07 LAB — RESP PANEL BY RT-PCR (FLU A&B, COVID) ARPGX2
Influenza A by PCR: NEGATIVE
Influenza B by PCR: NEGATIVE
SARS Coronavirus 2 by RT PCR: NEGATIVE

## 2021-01-07 LAB — MAGNESIUM: Magnesium: 1.7 mg/dL (ref 1.7–2.4)

## 2021-01-07 LAB — PHOSPHORUS: Phosphorus: 2.3 mg/dL — ABNORMAL LOW (ref 2.5–4.6)

## 2021-01-07 LAB — COMPREHENSIVE METABOLIC PANEL
ALT: 19 U/L (ref 0–44)
AST: 20 U/L (ref 15–41)
Albumin: 3.4 g/dL — ABNORMAL LOW (ref 3.5–5.0)
Alkaline Phosphatase: 40 U/L (ref 38–126)
Anion gap: 11 (ref 5–15)
BUN: 31 mg/dL — ABNORMAL HIGH (ref 8–23)
CO2: 27 mmol/L (ref 22–32)
Calcium: 8.7 mg/dL — ABNORMAL LOW (ref 8.9–10.3)
Chloride: 95 mmol/L — ABNORMAL LOW (ref 98–111)
Creatinine, Ser: 1.18 mg/dL (ref 0.61–1.24)
GFR, Estimated: >60 mL/min (ref >60)
Glucose, Bld: 127 mg/dL — ABNORMAL HIGH (ref 70–99)
Potassium: 3.6 mmol/L (ref 3.5–5.1)
Sodium: 133 mmol/L — ABNORMAL LOW (ref 135–145)
Total Bilirubin: 0.9 mg/dL (ref 0.3–1.2)
Total Protein: 6.8 g/dL (ref 6.5–8.1)

## 2021-01-07 LAB — TSH: TSH: 0.615 u[IU]/mL (ref 0.350–4.500)

## 2021-01-07 LAB — TROPONIN I (HIGH SENSITIVITY)
Troponin I (High Sensitivity): 14 ng/L (ref <18)
Troponin I (High Sensitivity): 13 ng/L (ref <18)

## 2021-01-07 LAB — URINALYSIS, COMPLETE (UACMP) WITH MICROSCOPIC
Bilirubin Urine: NEGATIVE
Glucose, UA: NEGATIVE mg/dL
Hgb urine dipstick: NEGATIVE
Ketones, ur: NEGATIVE mg/dL
Leukocytes,Ua: NEGATIVE
Nitrite: NEGATIVE
Protein, ur: 30 mg/dL — AB
Specific Gravity, Urine: 1.041 — ABNORMAL HIGH (ref 1.005–1.030)
Squamous Epithelial / HPF: NONE SEEN (ref 0–5)
pH: 5 (ref 5.0–8.0)

## 2021-01-07 LAB — LACTIC ACID, PLASMA: Lactic Acid, Venous: 1.6 mmol/L (ref 0.5–1.9)

## 2021-01-07 LAB — IRON AND TIBC
Iron: 10 ug/dL — ABNORMAL LOW (ref 45–182)
Saturation Ratios: 4 % — ABNORMAL LOW (ref 17.9–39.5)
TIBC: 238 ug/dL — ABNORMAL LOW (ref 250–450)
UIBC: 228 ug/dL

## 2021-01-07 LAB — APTT: aPTT: 32 seconds (ref 24–36)

## 2021-01-07 LAB — PROTIME-INR
INR: 1.1 (ref 0.8–1.2)
Prothrombin Time: 14.1 seconds (ref 11.4–15.2)

## 2021-01-07 IMAGING — CT CT HEAD W/O CM
3 series · 15 of 47 positions shown, 18 images · non-contrast
Comparison: None.

CLINICAL DATA: Delirium

EXAM:
CT HEAD WITHOUT CONTRAST
TECHNIQUE: Contiguous axial images were obtained from the base of the skull
through the vertex without intravenous contrast.

[Series 2: head wo · axial · 0.42mm/px · z∈[-102,+33]mm · 9 of 33 slices shown, 12 images]
[im 3/33  brain]
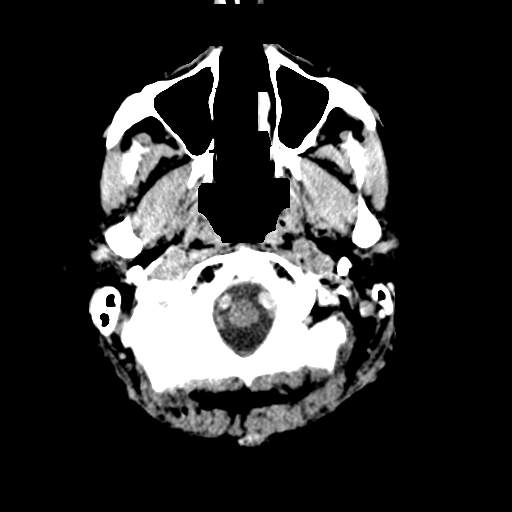
[im 3/33  bone]
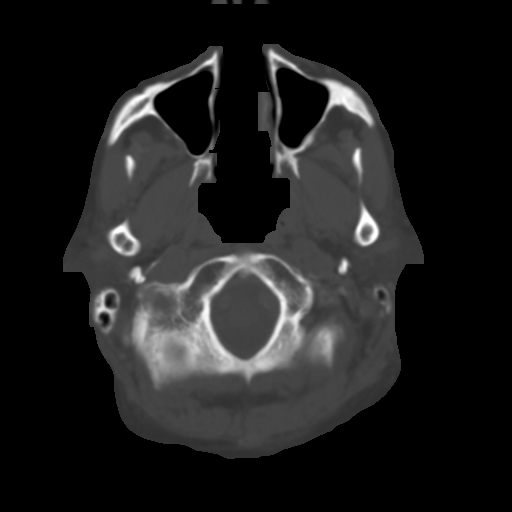
[im 6/33  brain]
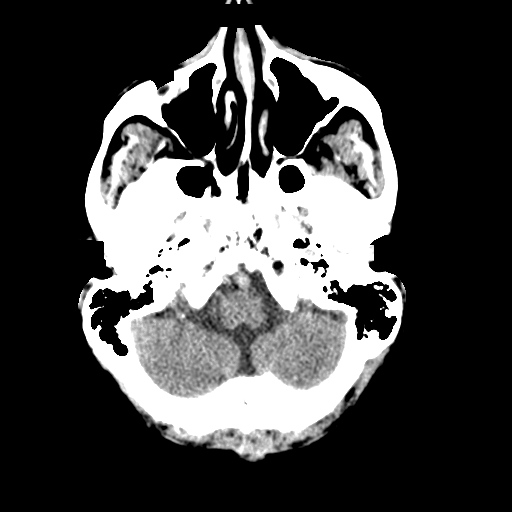
[im 9/33  brain]
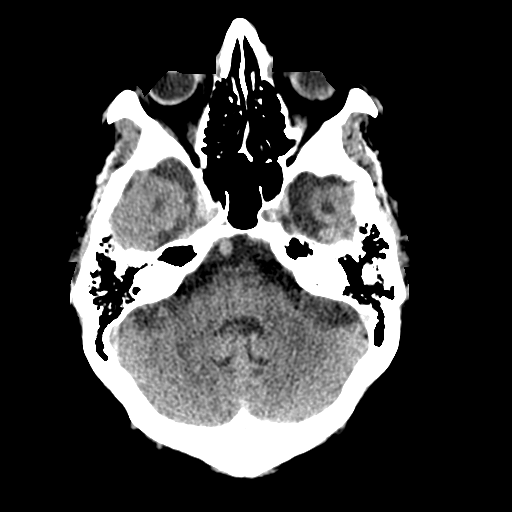
[im 13/33  brain]
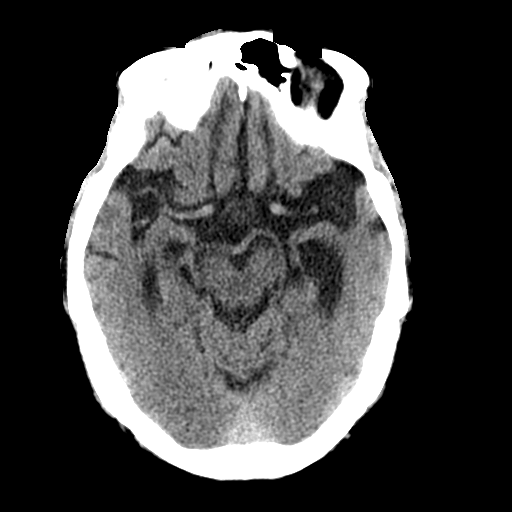
[im 17/33  brain]
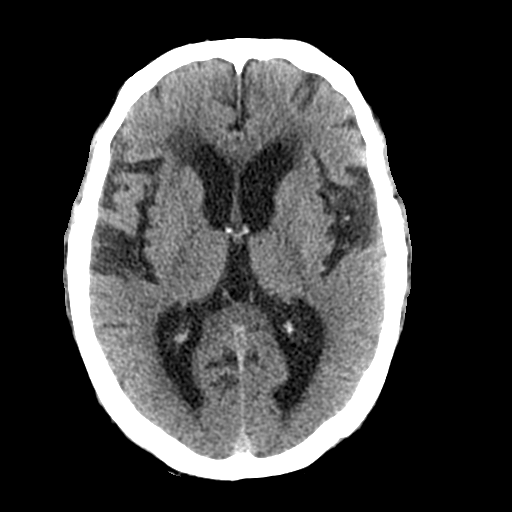
[im 17/33  bone]
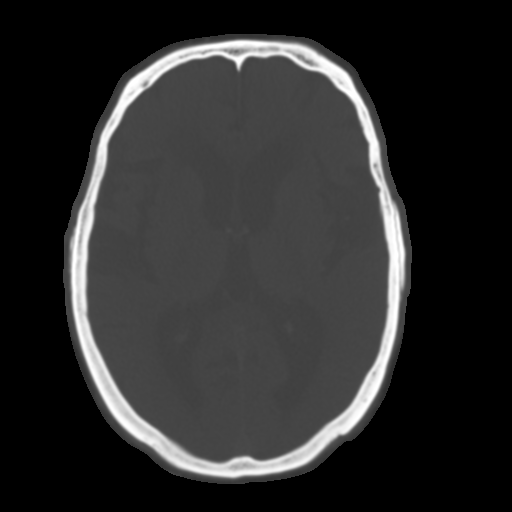
[im 20/33  brain]
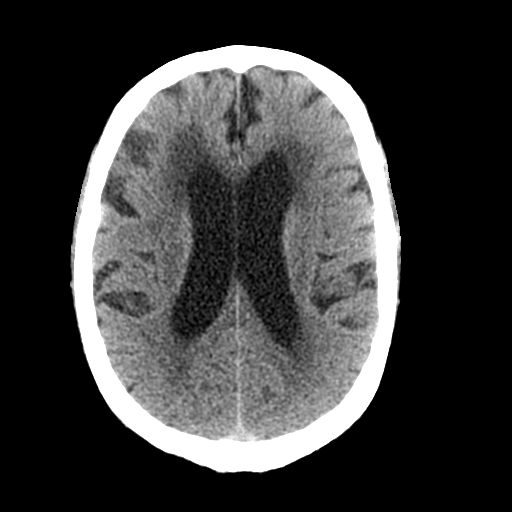
[im 24/33  brain]
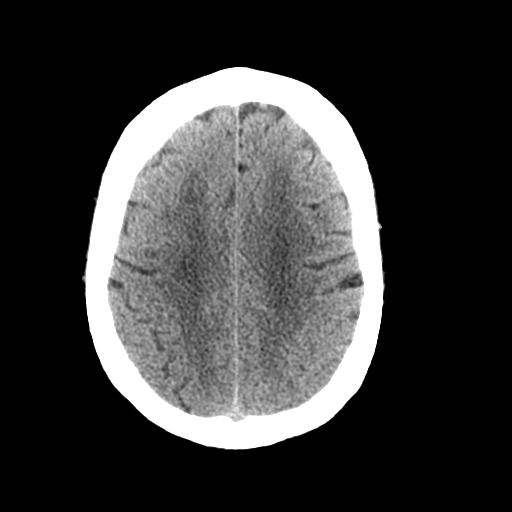
[im 27/33  brain]
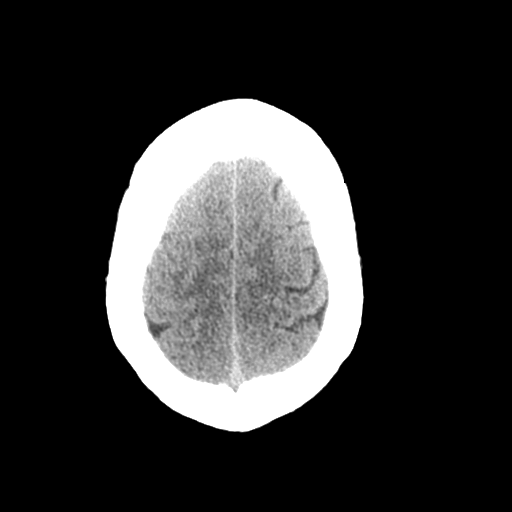
[im 30/33  brain]
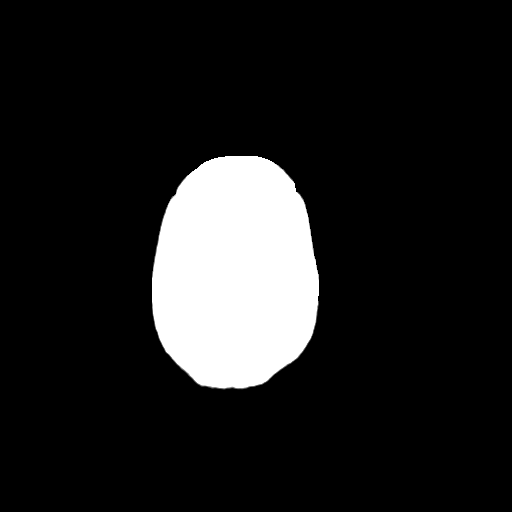
[im 30/33  bone]
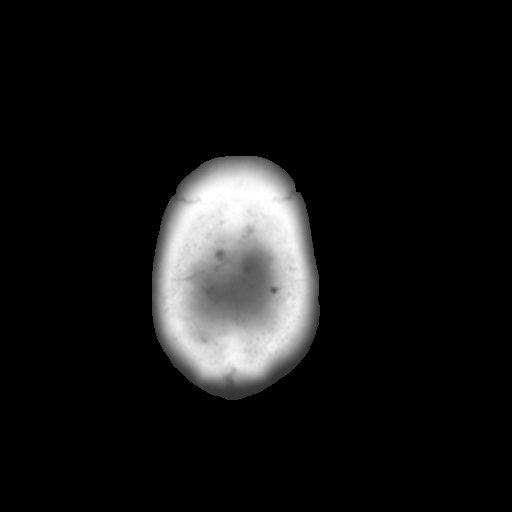

[Series 4: coronal soft tissue · coronal · 0.32mm/px · 3 of 68 slices shown]
[im 23/68  brain]
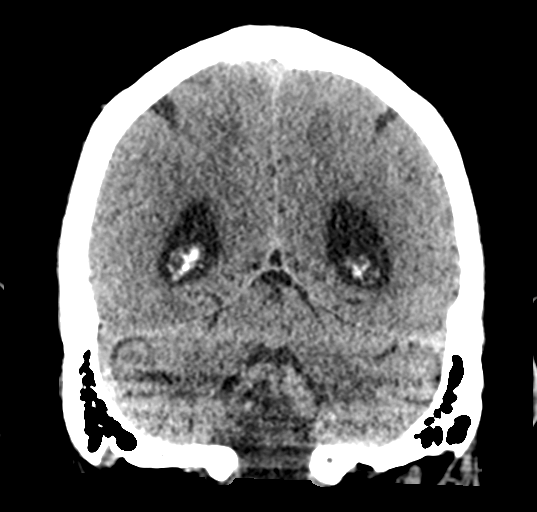
[im 30/68  brain]
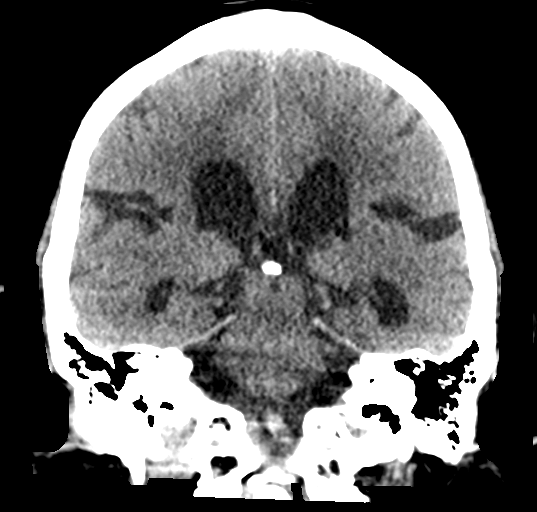
[im 38/68  brain]
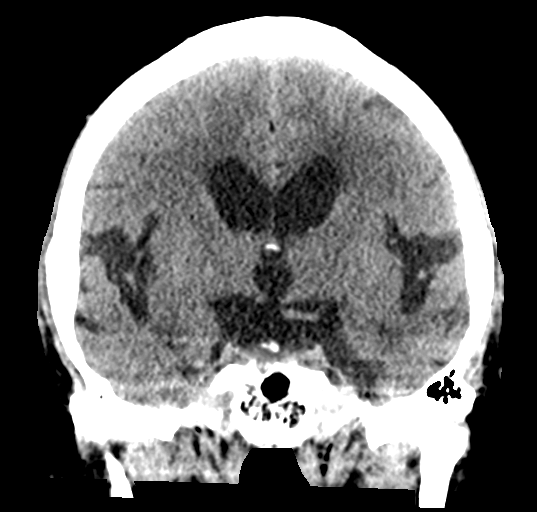

[Series 5: sagittal soft tissue · sagittal · 0.32mm/px · 3 of 54 slices shown]
[im 18/54  brain]
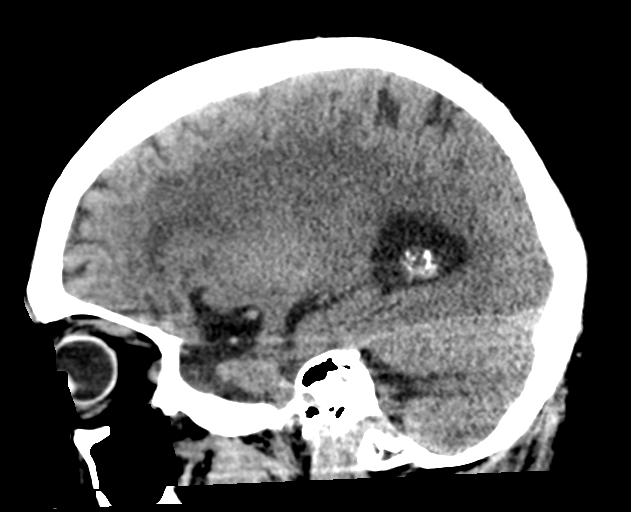
[im 27/54  brain]
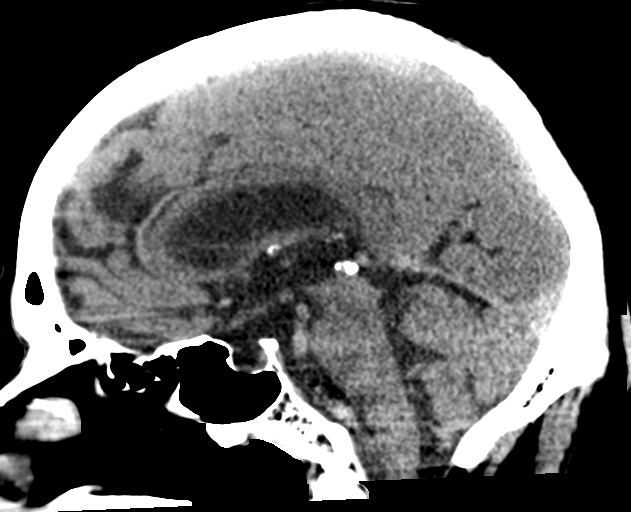
[im 36/54  brain]
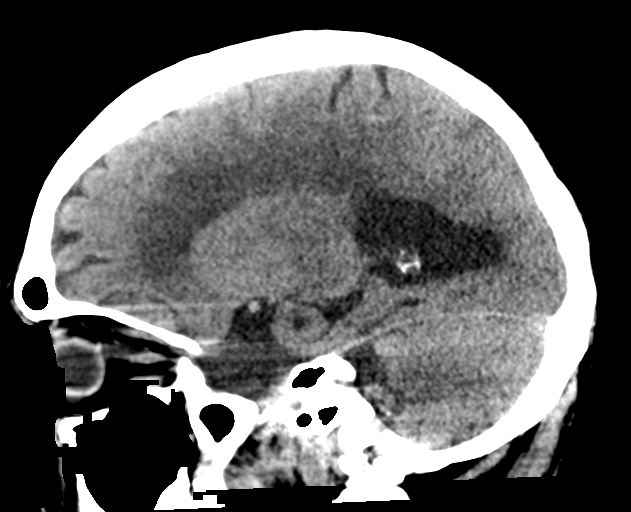

[15 of 47 positions shown; findings below may reference images not displayed]

FINDINGS: Brain: There is no acute intracranial hemorrhage, mass effect, or
edema. Gray-white differentiation is preserved. Prominence of the
ventricles and sulci reflects parenchymal volume loss.
Disproportionate temporal volume loss. Relative prominence of the
ventricles. Patchy and confluent hypoattenuation in the
supratentorial white matter is nonspecific but probably reflects
moderate chronic microvascular ischemic changes. There is no
extra-axial fluid collection.

Vascular: There is atherosclerotic calcification at the skull base.

Skull: Calvarium is unremarkable.

Sinuses/Orbits: No acute finding.

Other: None.
IMPRESSION: No acute intracranial hemorrhage or evidence of acute infarction.

Moderate chronic microvascular ischemic changes.

Parenchymal volume loss with disproportionate temporal atrophy.

Relative prominence of ventricles favored to be due to central
volume loss. A component of communicating/normal pressure
hydrocephalus is possible in the appropriate setting

## 2021-01-07 IMAGING — CT CT MAXILLOFACIAL W/ CM
3 series · 16 of 47 positions shown, 19 images · IV contrast (omnipaque)
Comparison: None.

CLINICAL DATA: Maxillary/facial abscess; technologist note states
swelling and pain right jaw, fell yesterday

EXAM:
CT MAXILLOFACIAL WITH CONTRAST
TECHNIQUE: Multidetector CT imaging of the maxillofacial structures was
performed with intravenous contrast. Multiplanar CT image
reconstructions were also generated.
CONTRAST:  75mL OMNIPAQUE IOHEXOL 350 MG/ML SOLN

[Series 2: max soft · axial · 0.34mm/px · z∈[-216,-60]mm · 10 of 92 slices shown, 13 images]
[im 7/92  brain]
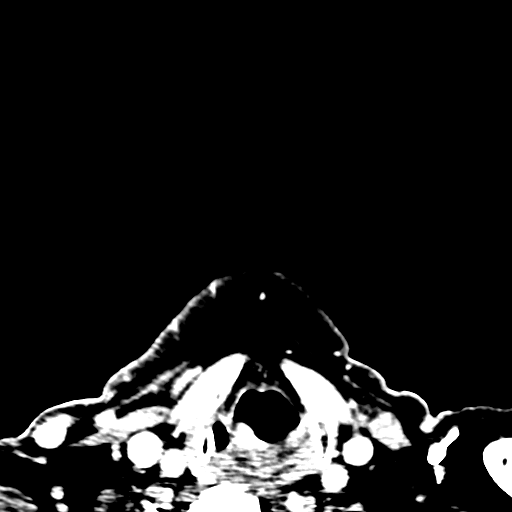
[im 7/92  bone]
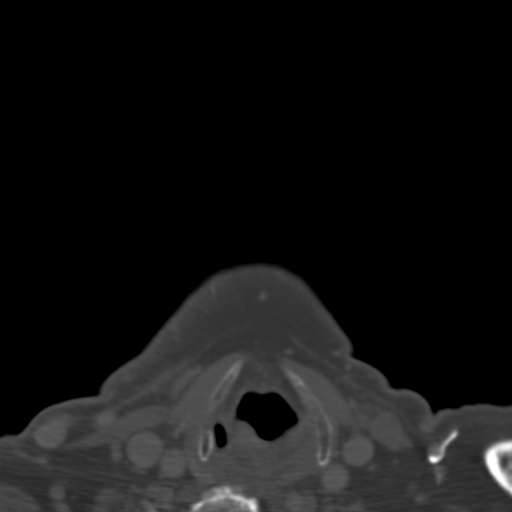
[im 16/92  bone]
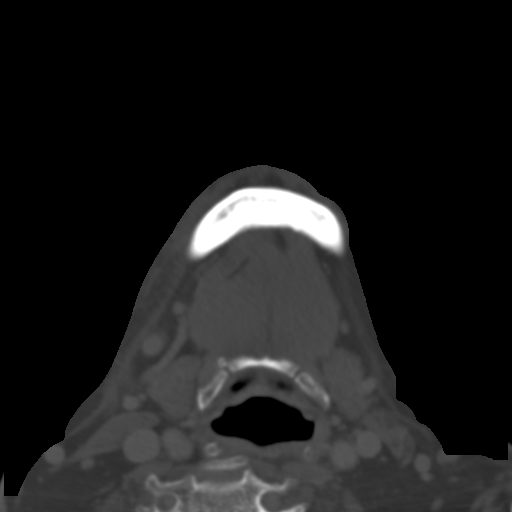
[im 26/92  bone]
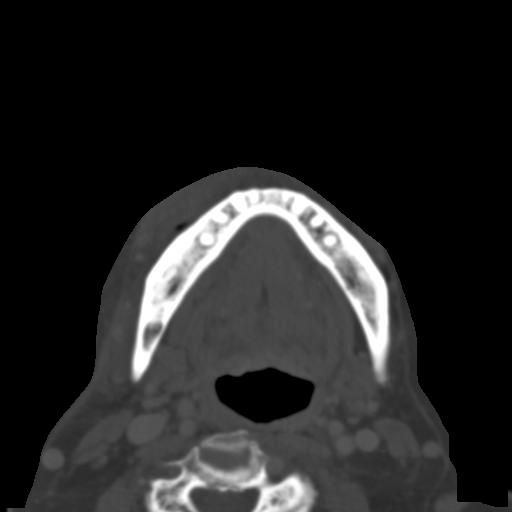
[im 32/92  bone]
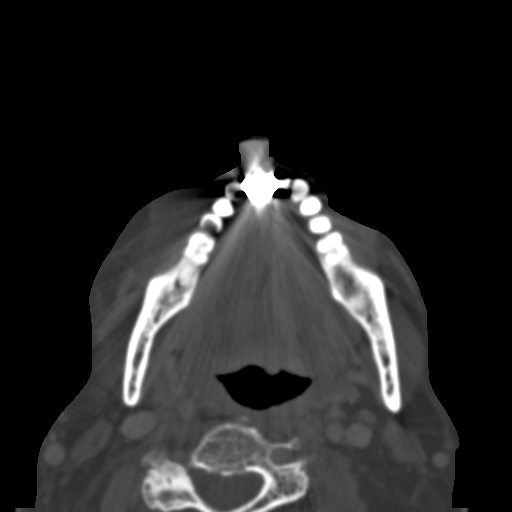
[im 41/92  brain]
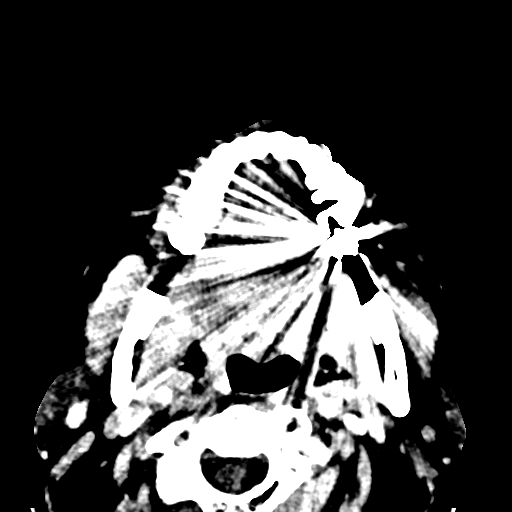
[im 41/92  bone]
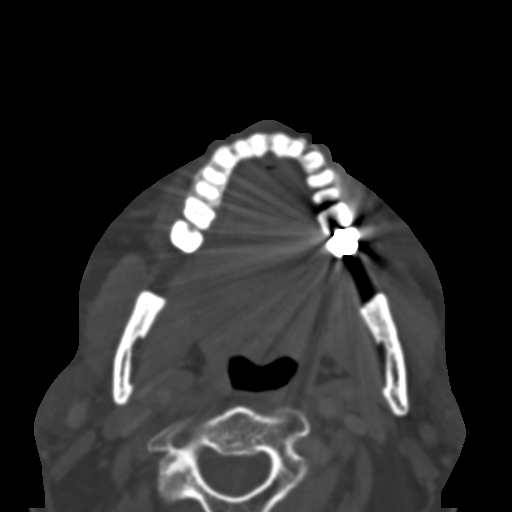
[im 51/92  bone]
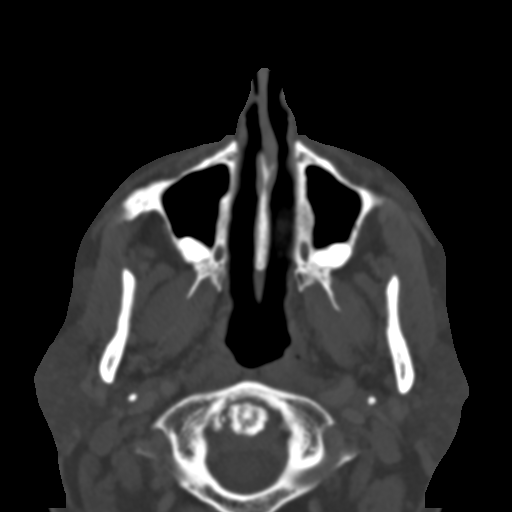
[im 60/92  bone]
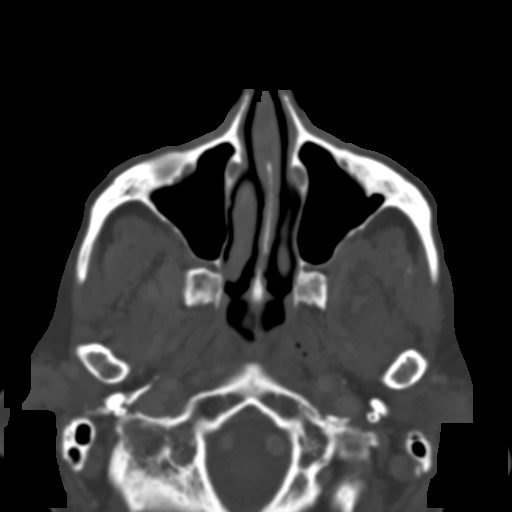
[im 70/92  bone]
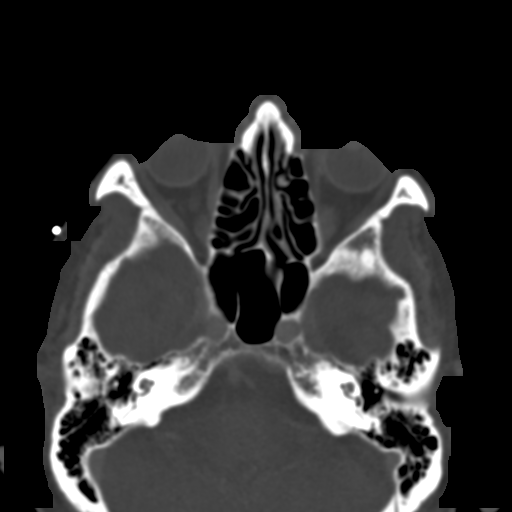
[im 76/92  brain]
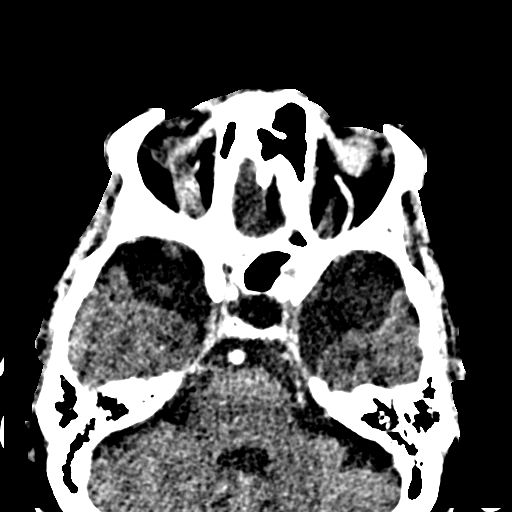
[im 76/92  bone]
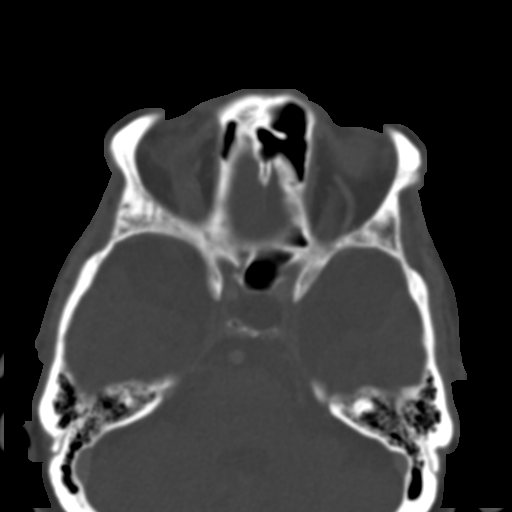
[im 85/92  bone]
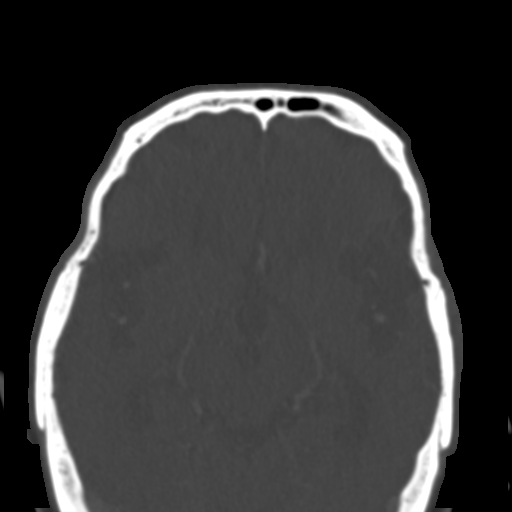

[Series 6: coronal soft · coronal · 0.36mm/px · 3 of 114 slices shown]
[im 38/114  bone]
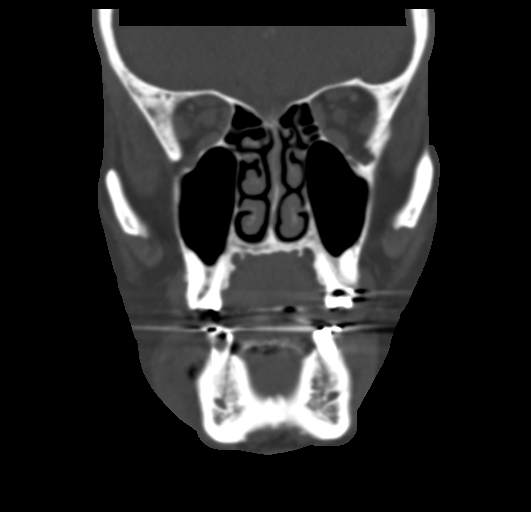
[im 51/114  bone]
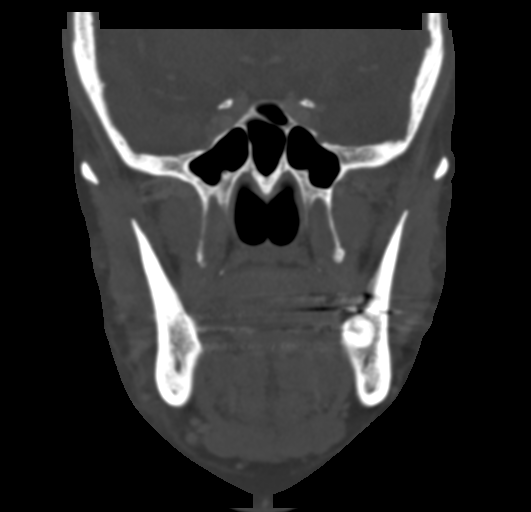
[im 63/114  bone]
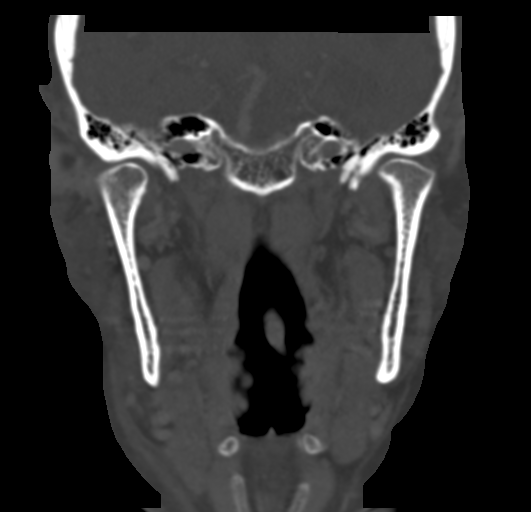

[Series 7: sagittal soft · sagittal · 0.36mm/px · 3 of 95 slices shown]
[im 32/95  bone]
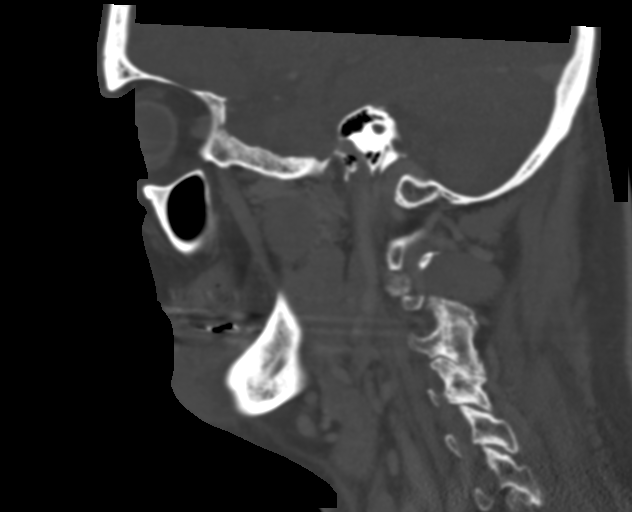
[im 48/95  bone]
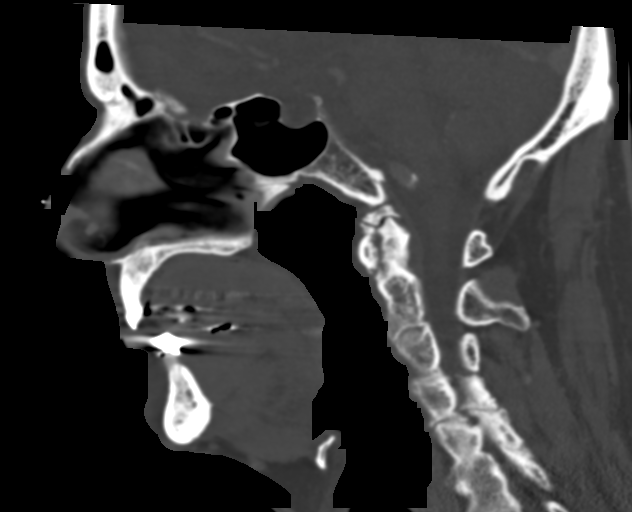
[im 63/95  bone]
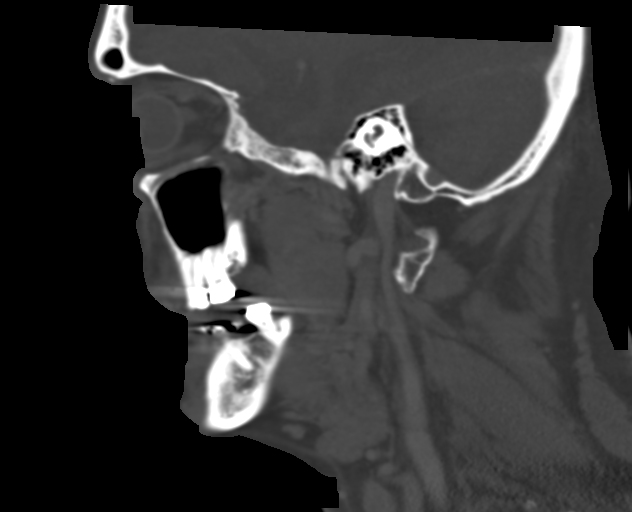

[16 of 47 positions shown; findings below may reference images not displayed]

FINDINGS: Osseous: No acute fracture.

Orbits: Unremarkable.

Sinuses: Minor mucosal thickening.  Mastoid air cells are clear.

Soft tissues: Soft tissue swelling with fat infiltration along the
body of the right mandible. Suboptimal visualization due to streak
artifact. No evidence of abscess. Dental decay is noted including
significantly decayed right mandibular posterior premolar and
posterior molars with periapical lucency.

Limited intracranial: No abnormal enhancement. Parenchymal volume
loss.
IMPRESSION: Soft tissue swelling along the body of the mandible on the right. No
acute fracture.

Significant right mandibular dental decay is noted and may be the
source of above. There is no soft tissue abscess.

## 2021-01-07 IMAGING — DX DG CHEST 1V PORT
1 series · 2 of 2 positions shown · non-contrast
Comparison: None.

CLINICAL DATA: Weakness.  Recent falls.

EXAM:
PORTABLE CHEST 1 VIEW

[Series 1: chest ap · 0.14mm/px · 2 of 2 slices shown]
[im 1/2]
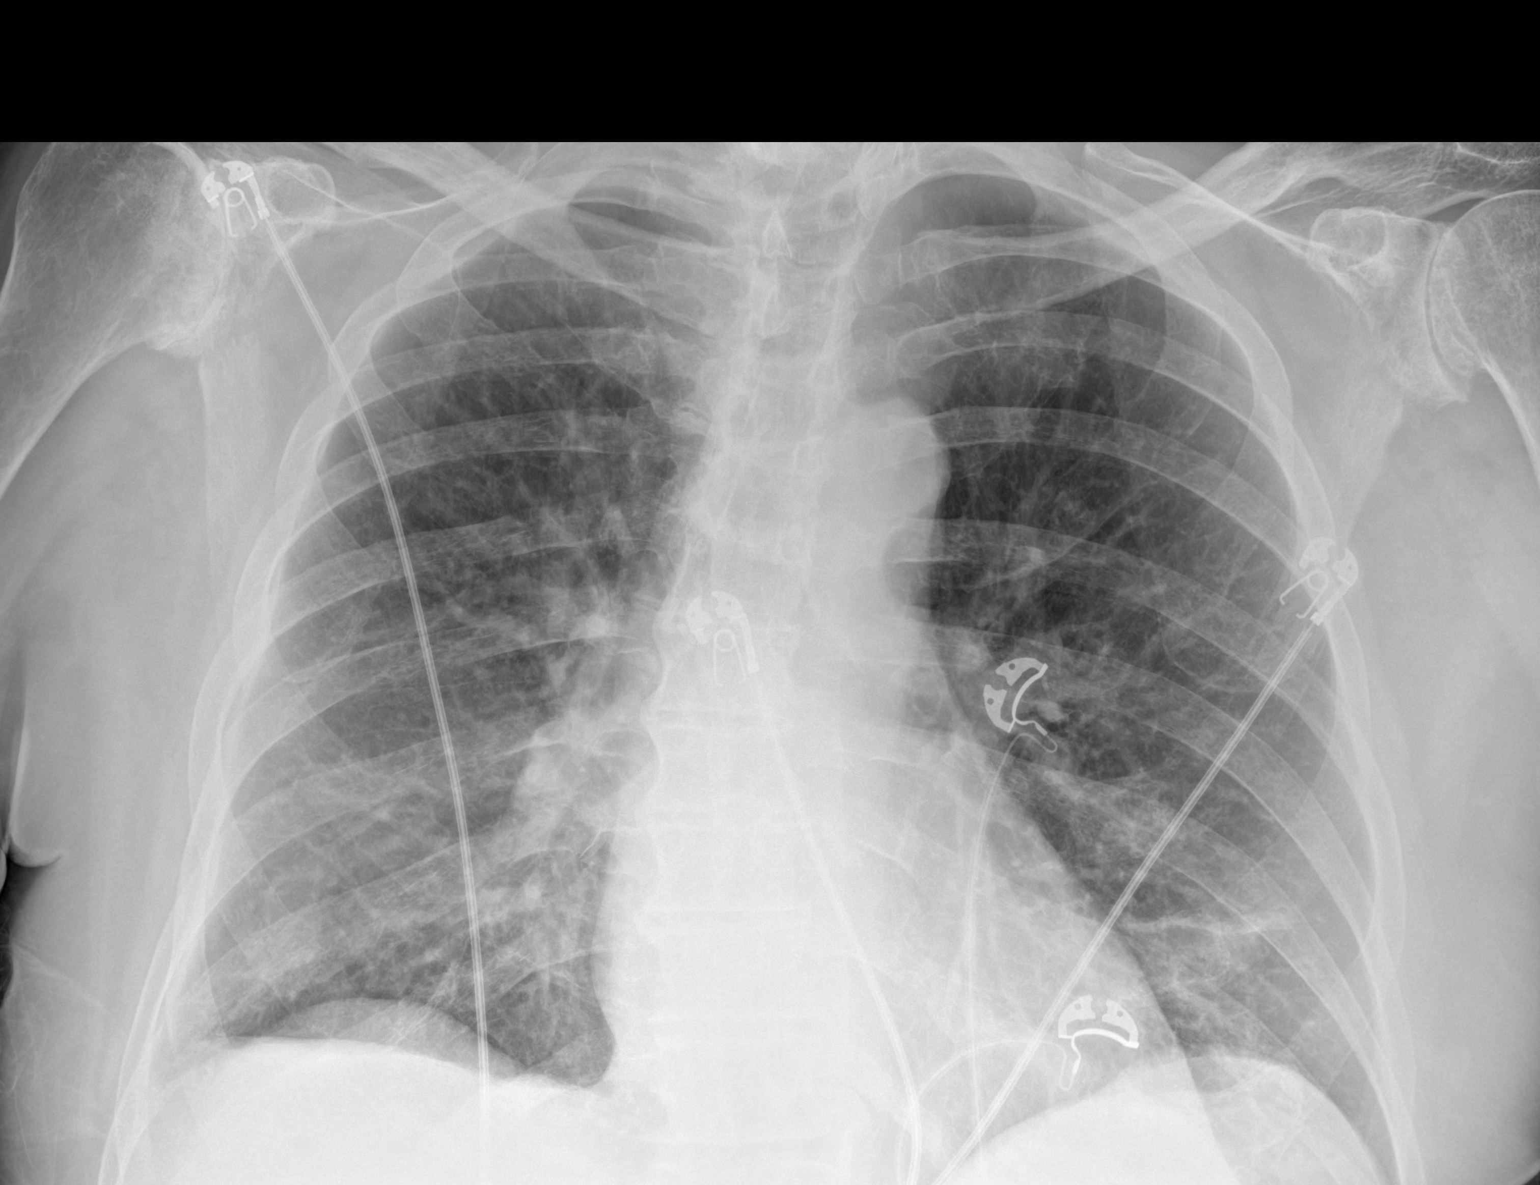
[im 2/2]
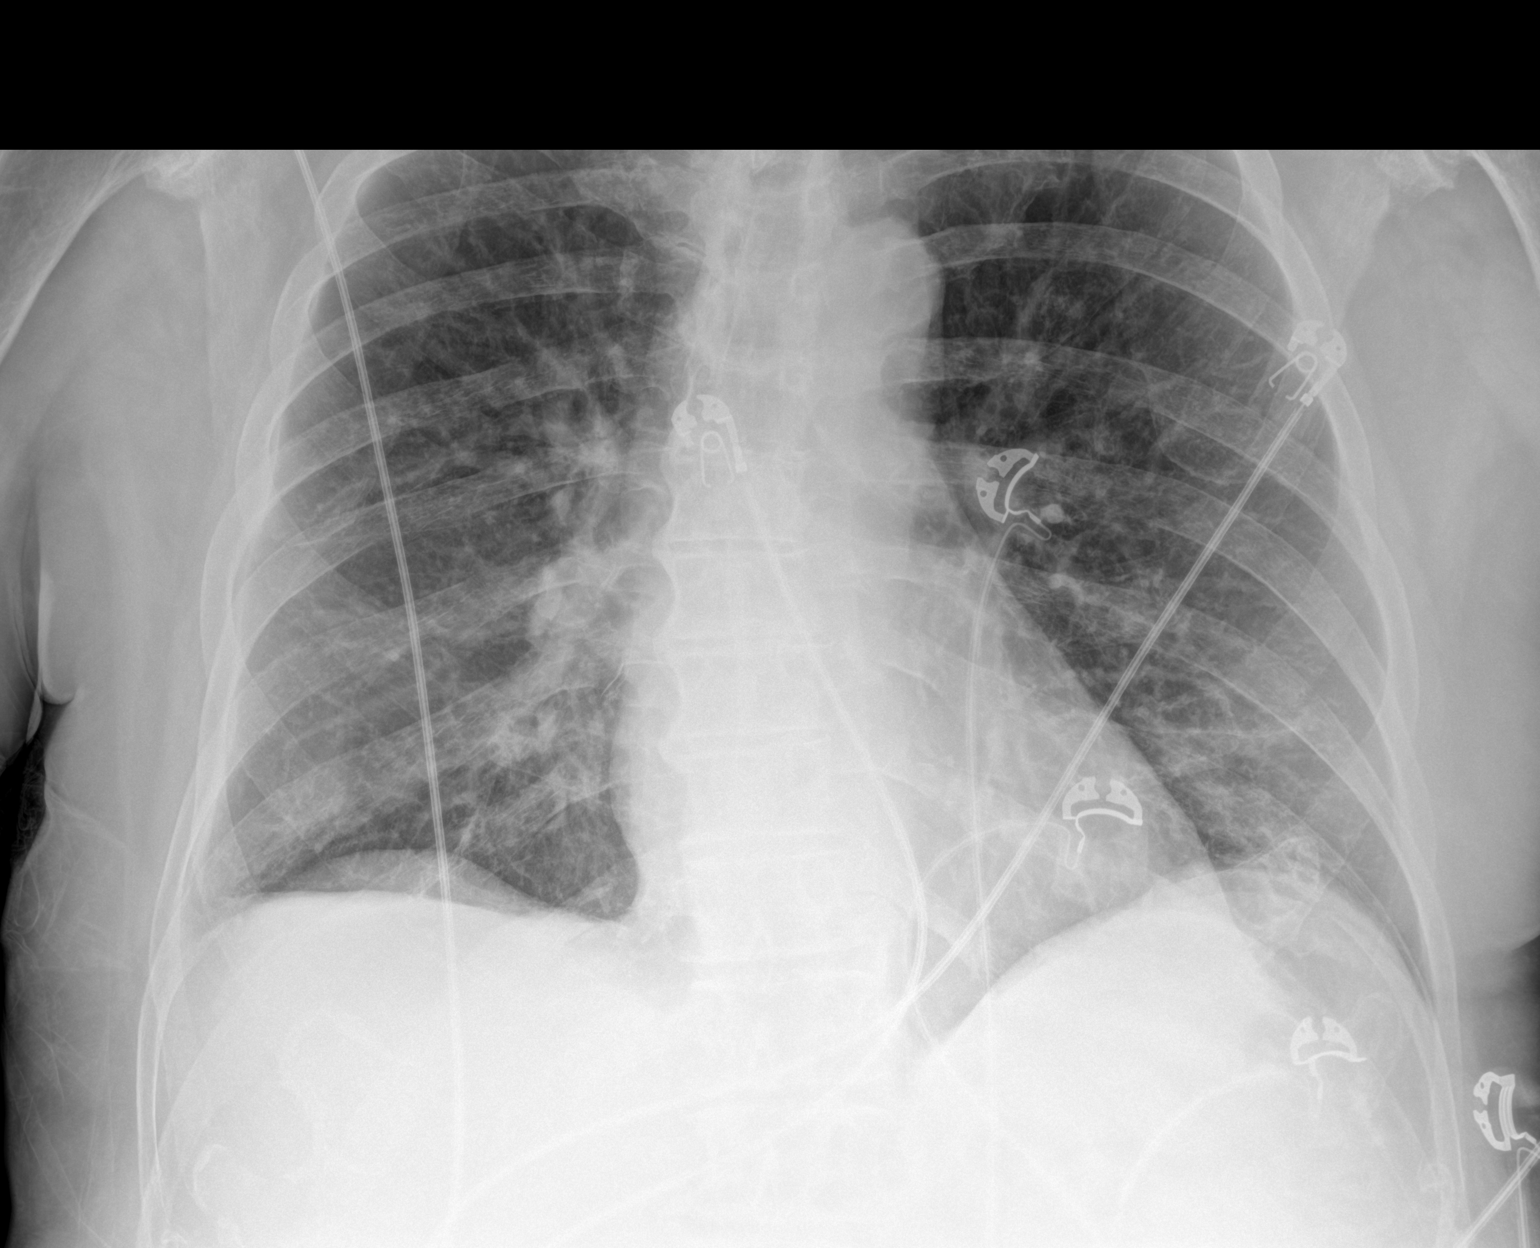

[2 of 2 positions shown; findings below may reference images not displayed]

FINDINGS: Heart size is normal. No pleural effusion or edema. Scarring is
again noted within the left lower lobe. No superimposed airspace
consolidation. Degenerative changes are noted within both
glenohumeral joints.
IMPRESSION: 1. No acute cardiopulmonary abnormalities.
2. Chronic scarring in the left lower lobe.

## 2021-01-07 MED ORDER — HEPARIN (PORCINE) 25000 UT/250ML-% IV SOLN
1850.0000 [IU]/h | INTRAVENOUS | Status: AC
  Administered 2021-01-07: 1300 [IU]/h via INTRAVENOUS
  Administered 2021-01-08: 1650 [IU]/h via INTRAVENOUS
  Administered 2021-01-08: 1850 [IU]/h via INTRAVENOUS
  Filled 2021-01-07 (×4): qty 250

## 2021-01-07 MED ORDER — HEPARIN BOLUS VIA INFUSION
4600.0000 [IU] | Freq: Once | INTRAVENOUS | Status: AC
  Administered 2021-01-07: 4600 [IU] via INTRAVENOUS
  Filled 2021-01-07: qty 4600

## 2021-01-07 MED ORDER — METOPROLOL TARTRATE 5 MG/5ML IV SOLN
5.0000 mg | INTRAVENOUS | Status: DC | PRN
  Administered 2021-01-07 (×2): 5 mg via INTRAVENOUS
  Filled 2021-01-07 (×2): qty 5

## 2021-01-07 MED ORDER — DOCUSATE SODIUM 100 MG PO CAPS
100.0000 mg | ORAL_CAPSULE | Freq: Two times a day (BID) | ORAL | Status: DC
  Administered 2021-01-07 – 2021-01-10 (×7): 100 mg via ORAL
  Filled 2021-01-07 (×7): qty 1

## 2021-01-07 MED ORDER — ACETAMINOPHEN 650 MG RE SUPP
650.0000 mg | Freq: Four times a day (QID) | RECTAL | Status: DC | PRN
  Filled 2021-01-07: qty 1

## 2021-01-07 MED ORDER — RISAQUAD PO CAPS
2.0000 | ORAL_CAPSULE | Freq: Three times a day (TID) | ORAL | Status: DC
  Administered 2021-01-07 – 2021-01-11 (×12): 2 via ORAL
  Filled 2021-01-07 (×12): qty 2

## 2021-01-07 MED ORDER — IOHEXOL 350 MG/ML SOLN
75.0000 mL | Freq: Once | INTRAVENOUS | Status: AC | PRN
  Administered 2021-01-07: 75 mL via INTRAVENOUS

## 2021-01-07 MED ORDER — HYDROMORPHONE HCL 1 MG/ML IJ SOLN: 0.5000 mg | INTRAMUSCULAR | Status: DC | PRN

## 2021-01-07 MED ORDER — DIGOXIN 0.25 MG/ML IJ SOLN
0.1250 mg | Freq: Four times a day (QID) | INTRAMUSCULAR | Status: AC
  Administered 2021-01-07 – 2021-01-08 (×4): 0.125 mg via INTRAVENOUS
  Filled 2021-01-07 (×4): qty 2

## 2021-01-07 MED ORDER — METOPROLOL TARTRATE 5 MG/5ML IV SOLN: 5.0000 mg | INTRAVENOUS | Status: DC | PRN

## 2021-01-07 MED ORDER — SODIUM CHLORIDE 0.9 % IV SOLN
3.0000 g | Freq: Four times a day (QID) | INTRAVENOUS | Status: DC
  Administered 2021-01-07 – 2021-01-11 (×15): 3 g via INTRAVENOUS
  Filled 2021-01-07: qty 8
  Filled 2021-01-07: qty 3
  Filled 2021-01-07: qty 8
  Filled 2021-01-07 (×4): qty 3
  Filled 2021-01-07 (×2): qty 8
  Filled 2021-01-07: qty 3
  Filled 2021-01-07 (×2): qty 8
  Filled 2021-01-07: qty 3
  Filled 2021-01-07: qty 8
  Filled 2021-01-07: qty 3
  Filled 2021-01-07: qty 8
  Filled 2021-01-07: qty 3
  Filled 2021-01-07: qty 8

## 2021-01-07 MED ORDER — ZOLPIDEM TARTRATE 5 MG PO TABS: 5.0000 mg | ORAL_TABLET | Freq: Every evening | ORAL | Status: DC | PRN

## 2021-01-07 MED ORDER — PANTOPRAZOLE SODIUM 40 MG PO TBEC
40.0000 mg | DELAYED_RELEASE_TABLET | Freq: Every day | ORAL | Status: DC
  Administered 2021-01-07 – 2021-01-11 (×5): 40 mg via ORAL
  Filled 2021-01-07 (×5): qty 1

## 2021-01-07 MED ORDER — ONDANSETRON HCL 4 MG/2ML IJ SOLN: 4.0000 mg | Freq: Four times a day (QID) | INTRAMUSCULAR | Status: DC | PRN

## 2021-01-07 MED ORDER — SODIUM CHLORIDE 0.9 % IV SOLN
3.0000 g | Freq: Once | INTRAVENOUS | Status: AC
  Administered 2021-01-07: 3 g via INTRAVENOUS
  Filled 2021-01-07: qty 8

## 2021-01-07 MED ORDER — POTASSIUM CHLORIDE CRYS ER 20 MEQ PO TBCR
40.0000 meq | EXTENDED_RELEASE_TABLET | Freq: Once | ORAL | Status: AC
  Administered 2021-01-07: 40 meq via ORAL
  Filled 2021-01-07: qty 2

## 2021-01-07 MED ORDER — SODIUM CHLORIDE 0.9 % IV SOLN: INTRAVENOUS | Status: AC

## 2021-01-07 MED ORDER — ONDANSETRON HCL 4 MG PO TABS: 4.0000 mg | ORAL_TABLET | Freq: Four times a day (QID) | ORAL | Status: DC | PRN

## 2021-01-07 MED ORDER — ACETAMINOPHEN ER 650 MG PO TBCR: 650.0000 mg | EXTENDED_RELEASE_TABLET | Freq: Two times a day (BID) | ORAL | Status: DC

## 2021-01-07 MED ORDER — BACID PO TABS
2.0000 | ORAL_TABLET | Freq: Three times a day (TID) | ORAL | Status: DC
  Filled 2021-01-07 (×2): qty 2

## 2021-01-07 MED ORDER — MAGNESIUM SULFATE 2 GM/50ML IV SOLN
2.0000 g | Freq: Once | INTRAVENOUS | Status: AC
  Administered 2021-01-07: 2 g via INTRAVENOUS
  Filled 2021-01-07: qty 50

## 2021-01-07 MED ORDER — ACETAMINOPHEN 325 MG PO TABS: 650.0000 mg | ORAL_TABLET | Freq: Four times a day (QID) | ORAL | Status: DC | PRN

## 2021-01-07 MED ORDER — LACTATED RINGERS IV BOLUS
1000.0000 mL | Freq: Once | INTRAVENOUS | Status: AC
  Administered 2021-01-07: 1000 mL via INTRAVENOUS

## 2021-01-07 MED ORDER — OMEPRAZOLE MAGNESIUM 20 MG PO TBEC: 20.0000 mg | DELAYED_RELEASE_TABLET | Freq: Every day | ORAL | Status: DC

## 2021-01-07 MED ORDER — BISACODYL 5 MG PO TBEC: 5.0000 mg | DELAYED_RELEASE_TABLET | Freq: Every day | ORAL | Status: DC | PRN

## 2021-01-07 NOTE — Evaluation (Addendum)
Clinical/Bedside Swallow Evaluation Patient Details  Name: Steven Welch MRN: [id number] Date of Birth: [birth year]  Today's Date: 01/07/2021 Time: SLP Start Time (ACUTE ONLY): 1 SLP Stop Time (ACUTE ONLY): 1440 SLP Time Calculation (min) (ACUTE ONLY): 55 min  Past Medical History:  Past Medical History:  Diagnosis Date   Cyst of breast 1981   right   GERD (gastroesophageal reflux disease)    Hepatitis 1988   History of shingles    Hyperlipidemia    Hypertension    Osteoarthritis of right hip    Skin cancer of face    Past Surgical History:  Past Surgical History:  Procedure Laterality Date   BREAST EXCISIONAL BIOPSY Right 1981   neg   COLON SURGERY     age 4 month old   TONSILLECTOMY     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   direct anterior approach   TOTAL HIP ARTHROPLASTY Right 01/01/2019   Procedure: TOTAL HIP ARTHROPLASTY ANTERIOR APPROACH;  Surgeon: Hessie Knows, MD;  Location: [facility] ORS;  Service: Orthopedics;  Laterality: Right;   HPI:  Pt is a 80 y.o. male with medical history significant of HTN, HLD, GERD, OA of knees also, neck and bilateral shoulder pains suspected to be due to underlying degenerative joint disease, came with increasing right facial swelling and pain and generalized weakness.     Symptoms started one week ago, with a increasing right mandibular tooth ache, then left jaw pain and swelling, no problem swelling normal food and no problem breathing. Episodes of chills but no fever, no chest pain or palpitations. Last 2-3 days, right jaw pain and swelling became worse, with increasing generalized weakness. ED Course: Low fever 80f 100.5 F, afib with RVR, BP borderline low. WBC 15.2.  CT of face showed right mandible soft tissue swelling and dental dental decay.  Limited intracranial: No abnormal enhancement. Parenchymal volume  loss.    Pt admitted w/ sepsis, afib; infectious source is the right tooth canal infection.    Assessment / Plan /  Recommendation  Clinical Impression  Pt appears to present w/ adequate oropharyngeal phase swallow function w/ No oropharyngeal phase dysphagia noted, No neuromuscular deficits noted. Pt consumed po trials w/ No overt, clinical s/s of aspiration during po trials. Pt appears at reduced risk for aspiration following general aspiration precautions. He would benefit from moistened, cut, cooked foods d/t the discomfort of mastication on the Right side of mouth -- discomfort in the Right mandible d/t right mandible soft tissue swelling and dental dental decay. Pt stated he has been chewing on his Left side since his teeth/mandible began hurting ~3-4 days ago. When asked if he consulted his Dentist at all, he stated "no".     During po trials, pt consumed all consistencies w/ no overt coughing, decline in vocal quality, or change in respiratory presentation during/post trials. O2 sats remained ~95%. Oral phase appeared grossly [facility] w/ timely bolus management, mastication on Left side of mouth, and control of bolus propulsion for A-P transfer for swallowing. Oral clearing achieved w/ all trial consistencies. OM Exam appeared [facility] w/ no unilateral weakness noted. Edema and redness noted along R mandible and side of face. There was no specific discomfort to touch intraorally. Speech Clear.  Pt fed self w/ setup support. Noted intermittent distraction w/ follow through during some tasks of daily care and pill swallowing w/ NSG/SLP which required Verbal Cues for follow through -- unsure of pt's Baseline Cognitive status.     Recommend a Advice worker  foods consistency diet w/ well-Cut meats, moistened cooked foods for Ease of Mastiction at this time; Thin liquids VIA CUP - pt does not use straws at home. Recommend general aspiration precautions, Pills WHOLE in Puree for safer, easier swallowing at this time as needed. Continue current PPI for any Reflux. Education given on Pills in Puree; food consistencies and easy to  eat options; general aspiration precautions. NSG to reconsult if any new needs arise. NSG agreed. Addendum: recommend f/u for assessment if any change in Cognitive status impacting his safety awareness in ADLs at next venue of care.  SLP Visit Diagnosis: Dysphagia, unspecified (R13.10)    Aspiration Risk   (reduced)    Diet Recommendation   Mech Soft/regular foods consistency diet w/ well-Cut meats, moistened cooked foods for Ease of Mastiction at this time; Thin liquids VIA CUP - pt does not use straws at home. Recommend general aspiration precautions, Reflux precautions baseline. Continue current PPI for any Reflux. Tray setup at meals as needed; positioning Upright.  Medication Administration: Whole meds with puree (for ease of swallowing)    Other  Recommendations Recommended Consults:  (Dentist) Oral Care Recommendations: Oral care BID;Oral care before and after PO;Patient independent with oral care Other Recommendations:  (n/a)    Recommendations for follow up therapy are one component of a multi-disciplinary discharge planning process, led by the attending physician.  Recommendations may be updated based on patient status, additional functional criteria and insurance authorization.  Follow up Recommendations None      Frequency and Duration  (n/a)   (n/a)       Prognosis Prognosis for Safe Diet Advancement: Good Barriers to Reach Goals: Time post onset;Severity of deficits (dental issue)      Swallow Study   General Date of Onset: 01/07/21 HPI: Pt is a 80 y.o. male with medical history significant of HTN, HLD, GERD, OA of knees also, neck and bilateral shoulder pains suspected to be due to underlying degenerative joint disease, came with increasing right facial swelling and pain and generalized weakness.     Symptoms started one week ago, with a increasing right mandibular tooth ache, then left jaw pain and swelling, no problem swelling normal food and no problem breathing.  Episodes of chills but no fever, no chest pain or palpitations. Last 2-3 days, right jaw pain and swelling became worse, with increasing generalized weakness. ED Course: Low fever 14f 100.5 F, afib with RVR, BP borderline low. WBC 15.2. CT of face showed right mandible soft tissue swelling and dental dental decay.  Limited intracranial: No abnormal enhancement. Parenchymal volume  loss.   Pt admitted w/ sepsis, afib; infectious source is the right tooth canal infection. Type of Study: Bedside Swallow Evaluation Previous Swallow Assessment: none Diet Prior to this Study: Thin liquids (full liquids; regular diet at home) Temperature Spikes Noted: No Respiratory Status: Room air History of Recent Intubation: No Behavior/Cognition: Alert;Cooperative;Pleasant mood;Distractible;Requires cueing (verbal cues) Oral Cavity Assessment: Edema;Erythema (along R mandible/face) Oral Care Completed by SLP: Yes Oral Cavity - Dentition: Adequate natural dentition Vision: Functional for self-feeding Self-Feeding Abilities: Able to feed self;Needs set up Patient Positioning: Upright in bed (needed support d/t stiffness) Baseline Vocal Quality: Normal Volitional Cough: Strong Volitional Swallow: Able to elicit    Oral/Motor/Sensory Function Overall Oral Motor/Sensory Function: Within functional limits   Ice Chips Ice chips: Within functional limits Presentation: Spoon (fed; 2 trials)   Thin Liquid Thin Liquid: Within functional limits Presentation: Cup;Self Fed (~8 ozs)    Nectar Thick  Nectar Thick Liquid: Not tested   Honey Thick Honey Thick Liquid: Not tested   Puree Puree: Within functional limits Presentation: Self Fed;Spoon (4 ozs)   Solid     Solid: Within functional limits (softened, moist) Presentation: Self Fed (8 trials)         Orinda Kenner, MS, [place] Speech Language Pathologist Re[facility] [phone] Janeva Peaster 01/07/2021,3:56 PM

## 2021-01-07 NOTE — ED Provider Notes (Signed)
[facility] Emergency Department Provider Note   ____________________________________________   Event Date/Time   First MD Initiated Contact with Patient 01/07/21 1045     (approximate)  I have reviewed the triage vital signs and the nursing notes.   HISTORY  Chief Complaint Weakness    HPI Steven Welch is a 80 y.o. male with past medical history of hypertension, hyperlipidemia, and GERD who presents to the ED complaining of weakness.  Patient reports that he has had about 1 week of increasing generalized weakness, especially when he tries to get up from a seated position.  He states he has begun feeling so weak that it is difficult for him to walk.  He had a fall yesterday where EMS came to his house, however he refused transport at that time.  EMS was again called to his home today after he had difficulty standing up from the couch.  They noted patient to be tachycardic with irregular rhythm, patient denies any history of atrial fibrillation.  He denies any fevers, cough, chest pain, or shortness of breath.  He does admit to increasing swelling and pain along the right lower portion of his face with pain along his right lower molars.        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 Authorizing Provider  acetaminophen (TYLENOL) 650 MG CR tablet Take 650 mg by mouth 2 (two) times daily.    [provider]  amLODipine (NORVASC) 10 MG tablet Take 10 mg by mouth daily.    [provider]  aspirin EC 81 MG tablet Take 81 mg by mouth daily.    [provider]  docusate sodium (COLACE) 100 MG capsule Take 1 capsule (100 mg total) by mouth 2 (two) times daily. 01/03/19   Duanne Guess, PA-C  enoxaparin (LOVENOX) 40 MG/0.4ML injection Inject 0.4 mLs (40 mg total) into the skin daily for 14 days. 01/03/19 01/17/19  Duanne Guess, PA-C  lisinopril-hydrochlorothiazide (ZESTORETIC) 20-12.5 MG tablet Take 2 tablets by mouth daily.    [provider]  omeprazole (PRILOSEC OTC) 20 MG tablet Take 20 mg by mouth daily.    [provider]  oxyCODONE (OXY IR/ROXICODONE) 5 MG immediate release tablet Take 1-2 tablets (5-10 mg total) by mouth every 4 (four) hours as needed for moderate pain (pain score 4-6). 01/03/19   Duanne Guess, PA-C  oxymetazoline (AFRIN) 0.05 % nasal spray Place 1 spray into both nostrils at bedtime.    [provider]  zolpidem (AMBIEN) 5 MG tablet  Take 5 mg by mouth at bedtime as needed for sleep.    [provider]    Allergies Patient has no known allergies.  Family History  Problem Relation Age of Onset   CAD Mother    Lung cancer Father     Social History Social History   Tobacco Use   Smoking status: Never   Smokeless tobacco: Never  Vaping Use   Vaping Use: Never used  Substance Use Topics   Alcohol use: Not Currently   Drug use: Never    Review of Systems  Constitutional: No fever/chills.  Positive for generalized weakness. Eyes: No visual changes. ENT: No sore throat.  Positive for facial pain and swelling. Cardiovascular: Denies chest  pain. Respiratory: Denies shortness of breath. Gastrointestinal: No abdominal pain.  No nausea, no vomiting.  No diarrhea.  No constipation. Genitourinary: Negative for dysuria. Musculoskeletal: Negative for back pain. Skin: Negative for rash. Neurological: Negative for headaches, focal weakness or numbness.  ____________________________________________   PHYSICAL EXAM:  VITAL SIGNS: ED Triage Vitals  Enc Vitals Group     BP 01/07/21 1052 (!) 88/72     Pulse Rate 01/07/21 1052 (!) 118     Resp 01/07/21 1052 (!) 27     Temp 01/07/21 1052 (!) 100.4 F (38 C)     Temp Source 01/07/21 1052 Oral     SpO2 01/07/21 1045 94 %     Weight 01/07/21 1047 211 lb (95.7 kg)     Height 01/07/21 1047 5\' 10"  (1.778 m)     Head Circumference --      Peak Flow --      Pain Score 01/07/21 1046 0     Pain Loc --      Pain Edu? --      Excl. in [place]? --     Constitutional: Alert and oriented. Eyes: Conjunctivae are normal. Head: Atraumatic. Nose: No congestion/rhinnorhea. Mouth/Throat: Mucous membranes are moist.  Edema noted to right lower face along mandibular line.  Edema and fluctuance noted along gumline at right lower molar.  There is associated erythema and tenderness to palpation.  No trismus noted, submandibular compartments are soft. Neck: Normal ROM Cardiovascular: Tachycardic, irregularly irregular rhythm. Grossly normal heart sounds.  2+ radial pulses bilaterally. Respiratory: Normal respiratory effort.  No retractions. Lungs CTAB. Gastrointestinal: Soft and nontender. No distention. Genitourinary: deferred Musculoskeletal: No lower extremity tenderness nor edema. Neurologic:  Normal speech and language. No gross focal neurologic deficits are appreciated. Skin:  Skin is warm, dry and intact. No rash noted. Psychiatric: Mood and affect are normal. Speech and behavior are normal.  ____________________________________________   LABS (all labs ordered are listed, but only abnormal  results are displayed)  Labs Reviewed  COMPREHENSIVE METABOLIC PANEL - Abnormal; Notable for the following components:      Result Value   Sodium 133 (*)    Chloride 95 (*)    Glucose, Bld 127 (*)    BUN 31 (*)    Calcium 8.7 (*)    Albumin 3.4 (*)    All other components within normal limits  CBC WITH DIFFERENTIAL/PLATELET - Abnormal; Notable for the following components:   WBC 15.2 (*)    RBC 4.16 (*)    MCH 34.1 (*)    Neutro Abs 12.8 (*)    Monocytes Absolute 1.3 (*)    Abs Immature Granulocytes 0.20 (*)    All other components within normal limits  URINALYSIS, COMPLETE (UACMP) WITH MICROSCOPIC - Abnormal; Notable for  the following components:   Color, Urine YELLOW (*)    APPearance CLEAR (*)    Specific Gravity, Urine 1.041 (*)    Protein, ur 30 (*)    Bacteria, UA RARE (*)    All other components within normal limits  RESP PANEL BY RT-PCR (FLU A&B, COVID) ARPGX2  CULTURE, BLOOD (ROUTINE X 2)  CULTURE, BLOOD (ROUTINE X 2)  URINE CULTURE  LACTIC ACID, PLASMA  PROTIME-INR  APTT  LACTIC ACID, PLASMA  TROPONIN I (HIGH SENSITIVITY)  TROPONIN I (HIGH SENSITIVITY)   ____________________________________________  EKG  ED ECG REPORT I, Blake Divine, the attending physician, personally viewed and interpreted this ECG.   Date: 01/07/2021  EKG Time: 10:53  Rate: 130  Rhythm: atrial fibrillation  Axis: Normal  Intervals:right bundle branch block  ST&T Change: None   PROCEDURES  Procedure(s) performed (including Critical Care):  .Critical Care Performed by: Blake Divine, MD Authorized by: Blake Divine, MD   Critical care provider statement:    Critical care time (minutes):  45   Critical care time was exclusive of:  Separately billable procedures and treating other patients and teaching time   Critical care was necessary to treat or prevent imminent or life-threatening deterioration of the following conditions:  Sepsis   Critical care was time spent  personally by me on the following activities:  Development of treatment plan with patient or surrogate, discussions with consultants, evaluation of patient's response to treatment, examination of patient, obtaining history from patient or surrogate, ordering and performing treatments and interventions, ordering and review of laboratory studies, ordering and review of radiographic studies, pulse oximetry, re-evaluation of patient's condition and review of old charts   I assumed direction of critical care for this patient from another provider in my specialty: no     Care discussed with: admitting provider   .1-3 Lead EKG Interpretation Performed by: Blake Divine, MD Authorized by: Blake Divine, MD     Interpretation: abnormal     ECG rate:  120-140   ECG rate assessment: tachycardic     Rhythm: atrial fibrillation     Ectopy: none     Conduction: normal     ____________________________________________   INITIAL IMPRESSION / ASSESSMENT AND PLAN / ED COURSE      80 year old male with past medical history of hypertension, hyperlipidemia, and GERD presents to the ED for increasing generalized weakness for about the past week, noted to be tachycardic with a regular rhythm by EMS.  EKG shows atrial fibrillation with RVR, no ischemic changes noted.  Patient also noted to be febrile and given his tachycardia he meets criteria for sepsis.  He appears to have a dental abscess as the source of his sepsis and we will start IV Unasyn, hydrate with IV fluids.  We will further assess with CT scan of his maxillofacial area, additional labs are pending.  CT scan shows inflammation and soft tissue swelling in the area of patient's right mandible consistent with dental infection but no signs of drainable abscess.  Heart rate gradually improving following IV fluid bolus and IV metoprolol.  Labs remarkable for leukocytosis consistent with patient's sepsis, but otherwise reassuring, lactic acid within normal  limits.  Case discussed with hospitalist for admission.      ____________________________________________   FINAL CLINICAL IMPRESSION(S) / ED DIAGNOSES  Final diagnoses:  Atrial fibrillation with RVR ([place])  Sepsis without acute organ dysfunction, due to unspecified organism [facility])  Dental infection     ED Discharge Orders  None        Note:  This document was prepared using Dragon voice recognition software and may include unintentional dictation errors.    Blake Divine, MD 01/07/21 1322

## 2021-01-07 NOTE — Consult Note (Signed)
ANTICOAGULATION CONSULT NOTE - Initial Consult  Pharmacy Consult for heparin Indication: atrial fibrillation  No Known Allergies  Patient Measurements: Height: 5\' 10"  (177.8 cm) Weight: 95.7 kg (211 lb) IBW/kg (Calculated) : 73 Heparin Dosing Weight: 92.6  Vital Signs: Temp: 100 F (37.8 C) (10/13 1330) Temp Source: Oral (10/13 1330) BP: 96/65 (10/13 1347) Pulse Rate: 70 (10/13 1347)  Labs: Recent Labs    01/07/21 1050 01/07/21 1145  HGB 14.2  --   HCT 40.2  --   PLT 208  --   APTT  --  32  LABPROT  --  14.1  INR  --  1.1  CREATININE 1.18  --   TROPONINIHS 14  --     Estimated Creatinine Clearance: 58.9 mL/min (by C-G formula based on SCr of 1.18 mg/dL).   Medical History: Past Medical History:  Diagnosis Date   Cyst of breast 1981   right   GERD (gastroesophageal reflux disease)    Hepatitis 1988   History of shingles    Hyperlipidemia    Hypertension    Osteoarthritis of right hip    Skin cancer of face     Medications:  Scheduled:   digoxin  0.125 mg Intravenous Q6H   docusate sodium  100 mg Oral BID   heparin  4,600 Units Intravenous Once   lactobacillus acidophilus  2 tablet Oral TID   omeprazole  20 mg Oral Daily   potassium chloride  40 mEq Oral Once    Assessment: 80yo M with PMH of HTN, HLD, and GERD who presented to the ED for weakness but was found to be in Afib w/ RVR. Pharmacy was consulted for heparin dosing.   Baseline labs: Hgb 14.2 PLT 208 PT 14.1 INR 1.1 aPTT 32  No anticoagulants were noted on PTA med rec  Goal of Therapy:  Heparin level 0.3-0.7 units/ml Monitor platelets by anticoagulation protocol: Yes   Plan:  Give 4600 units bolus x 1 Start heparin infusion at 1300 units/hr Check anti-Xa level in 8 hours and daily while on heparin Continue to monitor H&H and platelets with daily CBC while on heparin   Narda Rutherford, PharmD Pharmacy Resident  01/07/2021 2:06 PM

## 2021-01-07 NOTE — Consult Note (Signed)
CODE SEPSIS - PHARMACY COMMUNICATION  **Broad Spectrum Antibiotics should be administered within 1 hour of Sepsis diagnosis**  Time Code Sepsis Called/Page Received: 1105  Antibiotics Ordered: Unasyn   Time of 1st antibiotic administration: [place], PharmD Pharmacy Resident  01/07/2021 11:06 AM

## 2021-01-07 NOTE — ED Triage Notes (Signed)
Pt BIB [facility] from home after fall X 2 since yesterday. Pt reports increasing generalized weakness X 2 weeks. Per EMS, pt in Afib with rate 110-170. Also reports R jaw swellin and redness.

## 2021-01-07 NOTE — Sepsis Progress Note (Signed)
Notified bedside nurse of need to draw lactic acid.  

## 2021-01-07 NOTE — ED Notes (Signed)
Pt transported to CT via stretcher at this time.  

## 2021-01-07 NOTE — H&P (Signed)
History and Physical    Steven Welch HRC:[id number] DOB: 1941-03-13 DOA: 01/07/2021  PCP: Juluis Pitch, MD (Confirm with patient/family/NH records and if not entered, this has to be entered at [facility] point of entry) Patient coming from: Home  I have personally briefly reviewed patient's old medical records in [place]  Chief Complaint: Right face pain, feeling weak  HPI: Steven Welch is a 80 y.o. male with medical history significant of HTN, HLD, GERD, OA of knees, came with increasing right facial swelling and pain and generalized weakness.  Symptoms started one week ago, with a increasing right mandibular tooth ache, then left jaw pain and swelling, no problem swelling normal food and no problem breathing. Episodes of chills but no fever, no chest pain or palpitations. Last 2-3 days, right jaw pain and swelling became worse, with increasing generalized weakness. Still no fever at home.  ED Course: Low fever 80f 100.5 F, afib with RVR, BP borderline low. WBC 15.2. CT of face showed right mandible soft tissue swelling and dental dental decay, no abscess seen. Lactic acid=1.6.  Unasyn started.  Review of Systems: As per HPI otherwise 14 point review of systems negative.    Past Medical History:  Diagnosis Date   Cyst of breast 1981   right   GERD (gastroesophageal reflux disease)    Hepatitis 1988   History of shingles    Hyperlipidemia    Hypertension    Osteoarthritis of right hip    Skin cancer of face     Past Surgical History:  Procedure Laterality Date   BREAST EXCISIONAL BIOPSY Right 1981   neg   COLON SURGERY     age 12 month old   TONSILLECTOMY     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   direct anterior approach   TOTAL HIP ARTHROPLASTY Right 01/01/2019   Procedure: TOTAL HIP ARTHROPLASTY ANTERIOR APPROACH;  Surgeon: Hessie Knows, MD;  Location: [facility] ORS;  Service: Orthopedics;  Laterality: Right;     reports that he has never smoked. He has never used  smokeless tobacco. He reports that he does not currently use alcohol. He reports that he does not use drugs.  No Known Allergies  Family History  Problem Relation Age of Onset   CAD Mother    Lung cancer Father      Prior to Admission medications   Medication Sig Start Date End Date Taking? Authorizing Provider  acetaminophen (TYLENOL) 650 MG CR tablet Take 650 mg by mouth 2 (two) times daily.    [provider]  amLODipine (NORVASC) 10 MG tablet Take 10 mg by mouth daily.    [provider]  aspirin EC 81 MG tablet Take 81 mg by mouth daily.    [provider]  docusate sodium (COLACE) 100 MG capsule Take 1 capsule (100 mg total) by mouth 2 (two) times daily. 01/03/19   Duanne Guess, PA-C  enoxaparin (LOVENOX) 40 MG/0.4ML injection Inject 0.4 mLs (40 mg total) into the skin daily for 14 days. 01/03/19 01/17/19  Duanne Guess, PA-C  lisinopril-hydrochlorothiazide (ZESTORETIC) 20-12.5 MG tablet Take 2 tablets by mouth daily.    [provider]  omeprazole (PRILOSEC OTC) 20 MG tablet Take 20 mg by mouth daily.    [provider]  oxyCODONE (OXY IR/ROXICODONE) 5 MG immediate release tablet Take 1-2 tablets (5-10 mg total) by mouth every 4 (four) hours as needed for moderate pain (pain score 4-6). 01/03/19   Duanne Guess, PA-C  oxymetazoline (  AFRIN) 0.05 % nasal spray Place 1 spray into both nostrils at bedtime.    [provider]  zolpidem (AMBIEN) 5 MG tablet Take 5 mg by mouth at bedtime as needed for sleep.    [provider]    Physical Exam: Vitals:   01/07/21 1300 01/07/21 1315 01/07/21 1330 01/07/21 1347  BP: (!) 100/47 (!) 95/57 98/65 96/65   Pulse: (!) 104  96 70  Resp: (!) 23 14 15  (!) 23  Temp:   100 F (37.8 C)   TempSrc:   Oral   SpO2: 98%  98% 98%  Weight:      Height:        Constitutional: NAD, calm, comfortable Vitals:   01/07/21 1300 01/07/21 1315 01/07/21 1330 01/07/21 1347  BP: (!) 100/47  (!) 95/57 98/65 96/65   Pulse: (!) 104  96 70  Resp: (!) 23 14 15  (!) 23  Temp:   100 F (37.8 C)   TempSrc:   Oral   SpO2: 98%  98% 98%  Weight:      Height:       Eyes: PERRL, lids and conjunctivae normal ENMT: Right jaw swelling and tender to touch with submandibular lymphadenopathy, no trouble close mouth, no facial droop or drooling Neck: normal, supple, no masses, no thyromegaly Respiratory: clear to auscultation bilaterally, no wheezing, no crackles. Normal respiratory effort. No accessory muscle use.  Cardiovascular: Regular rate and rhythm, no murmurs / rubs / gallops. No extremity edema. 2+ pedal pulses. No carotid bruits.  Abdomen: no tenderness, no masses palpated. No hepatosplenomegaly. Bowel sounds positive.  Musculoskeletal: no clubbing / cyanosis. No joint deformity upper and lower extremities. Good ROM, no contractures. Normal muscle tone.  Skin: no rashes, lesions, ulcers. No induration Neurologic: CN 2-12 grossly intact. Sensation intact, DTR normal. Strength 5/5 in all 4.  Psychiatric: Normal judgment and insight. Alert and oriented x 3. Normal mood.    Labs on Admission: I have personally reviewed following labs and imaging studies  CBC: Recent Labs  Lab 01/07/21 1050  WBC 15.2*  NEUTROABS 12.8*  HGB 14.2  HCT 40.2  MCV 96.6  PLT 528   Basic Metabolic Panel: Recent Labs  Lab 01/07/21 1050  NA 133*  K 3.6  CL 95*  CO2 27  GLUCOSE 127*  BUN 31*  CREATININE 1.18  CALCIUM 8.7*   GFR: Estimated Creatinine Clearance: 58.9 mL/min (by C-G formula based on SCr of 1.18 mg/dL). Liver Function Tests: Recent Labs  Lab 01/07/21 1050  AST 20  ALT 19  ALKPHOS 40  BILITOT 0.9  PROT 6.8  ALBUMIN 3.4*   No results for input(s): LIPASE, AMYLASE in the last 168 hours. No results for input(s): AMMONIA in the last 168 hours. Coagulation Profile: Recent Labs  Lab 01/07/21 1145  INR 1.1   Cardiac Enzymes: No results for input(s): CKTOTAL, CKMB,  CKMBINDEX, TROPONINI in the last 168 hours. BNP (last 3 results) No results for input(s): PROBNP in the last 8760 hours. HbA1C: No results for input(s): HGBA1C in the last 72 hours. CBG: No results for input(s): GLUCAP in the last 168 hours. Lipid Profile: No results for input(s): CHOL, HDL, LDLCALC, TRIG, CHOLHDL, LDLDIRECT in the last 72 hours. Thyroid Function Tests: No results for input(s): TSH, T4TOTAL, FREET4, T3FREE, THYROIDAB in the last 72 hours. Anemia Panel: No results for input(s): VITAMINB12, FOLATE, FERRITIN, TIBC, IRON, RETICCTPCT in the last 72 hours. Urine analysis:    Component Value Date/Time   COLORURINE YELLOW (A)  01/07/2021 1145   APPEARANCEUR CLEAR (A) 01/07/2021 1145   APPEARANCEUR Clear 02/12/2014 1357   LABSPEC 1.041 (H) 01/07/2021 1145   LABSPEC 1.018 02/12/2014 1357   PHURINE 5.0 01/07/2021 1145   GLUCOSEU NEGATIVE 01/07/2021 1145   GLUCOSEU Negative 02/12/2014 1357   HGBUR NEGATIVE 01/07/2021 [place] 01/07/2021 1145   BILIRUBINUR Negative 02/12/2014 1357   [place] 01/07/2021 1145   PROTEINUR 30 (A) 01/07/2021 1145   NITRITE NEGATIVE 01/07/2021 1145   LEUKOCYTESUR NEGATIVE 01/07/2021 1145   LEUKOCYTESUR Negative 02/12/2014 1357    Radiological Exams on Admission: CT Maxillofacial W Contrast  Result Date: 01/07/2021 CLINICAL DATA:  Maxillary/facial abscess; technologist note states swelling and pain right jaw, fell yesterday EXAM: CT MAXILLOFACIAL WITH CONTRAST TECHNIQUE: Multidetector CT imaging of the maxillofacial structures was performed with intravenous contrast. Multiplanar CT image reconstructions were also generated. CONTRAST:  55mL OMNIPAQUE IOHEXOL 350 MG/ML SOLN COMPARISON:  None. FINDINGS: Osseous: No acute fracture. Orbits: Unremarkable. Sinuses: Minor mucosal thickening.  Mastoid air cells are clear. Soft tissues: Soft tissue swelling with fat infiltration along the body of the right mandible. Suboptimal  visualization due to streak artifact. No evidence of abscess. Dental decay is noted including significantly decayed right mandibular posterior premolar and posterior molars with periapical lucency. Limited intracranial: No abnormal enhancement. Parenchymal volume loss. IMPRESSION: Soft tissue swelling along the body of the mandible on the right. No acute fracture. Significant right mandibular dental decay is noted and may be the source of above. There is no soft tissue abscess. Electronically Signed   By: [name] M.D.   On: 01/07/2021 12:20   DG Chest Port 1 View  Result Date: 01/07/2021 CLINICAL DATA:  Weakness.  Recent falls. EXAM: PORTABLE CHEST 1 VIEW COMPARISON:  None. FINDINGS: Heart size is normal. No pleural effusion or edema. Scarring is again noted within the left lower lobe. No superimposed airspace consolidation. Degenerative changes are noted within both glenohumeral joints. IMPRESSION: 1. No acute cardiopulmonary abnormalities. 2. 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 Electronically Signed   By: Kerby Moors M.D.   On: 01/07/2021 12:13    EKG: Independently reviewed. Rapid afib.  Assessment/Plan Active Problems:   Sepsis ([place])   A-fib ([place])  (please populate well all problems here in Problem List. (For example, if patient is on BP meds at home and you resume or decide to hold them, it is a problem that needs to be her. Same for CAD, COPD, HLD and so on)  Sepsis -Evidenced by tachycardia, new fever and leukocytosis, no end organ damage, infectious source is the right tooth canal infection. -BP on low side, received a total of 2 liters of IV bolus and will continue maintenance IVF. -Unasyn. -No clear signs of dental abscess, once infection and HR controlled, expect patient can go home follow up dentist outpatient.  New onset of afib -CHADS2=2 -Reviewed patient's PMH, no Hx of GI bleed, no Hx of stroke and no anemia. Start Heparin drip, check FOBT and repeat H/H in AM.  If H/H stable, can go home with Eliquis and f/u with afib clinic. -BP borderline low, can be attributed to sepsis and uncontrolled HR/afib. Will load patient with digoxin 0.125 mg Q6H x 4 doses, and PRN lopressor. If BP improves, can consider start PO beta blocker. -Make K>4.0 and Mg>2, Mg level pending. -Echo in AM when HR more controlled.  Deconditioning -PT evaluation  HTN -BP borderline low, hold home BP meds for now.  OA -No  issue.  GERD -Continue PPI  DVT prophylaxis: Heparin drip Code Status: Full code Family Communication: Wife at bedside Disposition Plan: Expect more than 2 night hospital stay Consults called: None Admission status: PCU   Lequita Halt MD [facility] Pager [phone]  01/07/2021, 1:59 PM

## 2021-01-07 NOTE — ED Notes (Signed)
Pt alert, pt on 2 liters oxygen.  Afib on monitor.  No chest pain  meds infusing.

## 2021-01-07 NOTE — Sepsis Progress Note (Signed)
ELink tracking the Code Sepsis. 

## 2021-01-07 NOTE — ED Notes (Signed)
Pt alert, meds infusing.  Skin warm and dry.  Pt watching tv   pt waiting on admission

## 2021-01-08 ENCOUNTER — Inpatient Hospital Stay (HOSPITAL_COMMUNITY)
Admit: 2021-01-08 | Discharge: 2021-01-08 | Disposition: A | Discharge status: Home / Self Care | Payer: Medicare Other | Attending: Internal Medicine | Admitting: Internal Medicine

## 2021-01-08 DIAGNOSIS — R531 Weakness: Secondary | ICD-10-CM

## 2021-01-08 DIAGNOSIS — K219 Gastro-esophageal reflux disease without esophagitis: Secondary | ICD-10-CM

## 2021-01-08 DIAGNOSIS — I4891 Unspecified atrial fibrillation: Secondary | ICD-10-CM

## 2021-01-08 DIAGNOSIS — E876 Hypokalemia: Secondary | ICD-10-CM

## 2021-01-08 DIAGNOSIS — R9431 Abnormal electrocardiogram [ECG] [EKG]: Secondary | ICD-10-CM

## 2021-01-08 DIAGNOSIS — K047 Periapical abscess without sinus: Secondary | ICD-10-CM

## 2021-01-08 LAB — BASIC METABOLIC PANEL
Anion gap: 4 — ABNORMAL LOW (ref 5–15)
BUN: 24 mg/dL — ABNORMAL HIGH (ref 8–23)
CO2: 27 mmol/L (ref 22–32)
Calcium: 7.7 mg/dL — ABNORMAL LOW (ref 8.9–10.3)
Chloride: 104 mmol/L (ref 98–111)
Creatinine, Ser: 0.9 mg/dL (ref 0.61–1.24)
GFR, Estimated: >60 mL/min (ref >60)
Glucose, Bld: 102 mg/dL — ABNORMAL HIGH (ref 70–99)
Potassium: 3.4 mmol/L — ABNORMAL LOW (ref 3.5–5.1)
Sodium: 135 mmol/L (ref 135–145)

## 2021-01-08 LAB — BLOOD CULTURE ID PANEL (REFLEXED) - BCID2

## 2021-01-08 LAB — ECHOCARDIOGRAM COMPLETE
AR max vel: 3.66 cm2
AV Area VTI: 3.42 cm2
AV Area mean vel: 3.57 cm2
AV Mean grad: 3 mmHg
AV Peak grad: 5 mmHg
Ao pk vel: 1.12 m/s
Area-P 1/2: 2.68 cm2
Height: 70 in
MV VTI: 3.9 cm2
S' Lateral: 3.6 cm
Weight: 3376 oz

## 2021-01-08 LAB — URINE CULTURE: Culture: NO GROWTH

## 2021-01-08 LAB — CBC
HCT: 33.1 % — ABNORMAL LOW (ref 39.0–52.0)
Hemoglobin: 11.6 g/dL — ABNORMAL LOW (ref 13.0–17.0)
MCH: 34.5 pg — ABNORMAL HIGH (ref 26.0–34.0)
MCHC: 35 g/dL (ref 30.0–36.0)
MCV: 98.5 fL (ref 80.0–100.0)
Platelets: 162 10*3/uL (ref 150–400)
RBC: 3.36 MIL/uL — ABNORMAL LOW (ref 4.22–5.81)
RDW: 14 % (ref 11.5–15.5)
WBC: 10.1 10*3/uL (ref 4.0–10.5)
nRBC: 0 % (ref 0.0–0.2)

## 2021-01-08 LAB — HEPARIN LEVEL (UNFRACTIONATED)
Heparin Unfractionated: <0.1 IU/mL — ABNORMAL LOW (ref 0.30–0.70)
Heparin Unfractionated: 0.32 IU/mL (ref 0.30–0.70)
Heparin Unfractionated: 0.29 IU/mL — ABNORMAL LOW (ref 0.30–0.70)

## 2021-01-08 MED ORDER — HEPARIN BOLUS VIA INFUSION
2700.0000 [IU] | Freq: Once | INTRAVENOUS | Status: AC
  Administered 2021-01-08: 2700 [IU] via INTRAVENOUS
  Filled 2021-01-08: qty 2700

## 2021-01-08 MED ORDER — HEPARIN BOLUS VIA INFUSION
1400.0000 [IU] | Freq: Once | INTRAVENOUS | Status: AC
  Administered 2021-01-08: 1400 [IU] via INTRAVENOUS
  Filled 2021-01-08: qty 1400

## 2021-01-08 MED ORDER — SODIUM CHLORIDE 0.9 % IV SOLN
INTRAVENOUS | Status: DC | PRN
  Administered 2021-01-08 – 2021-01-11 (×5): 250 mL via INTRAVENOUS

## 2021-01-08 NOTE — NC FL2 (Signed)
[place] LEVEL OF CARE SCREENING TOOL     IDENTIFICATION  Patient Name: Steven Welch Birthdate: [birth year] Sex: male Admission Date (Current Location): 01/07/2021  [place] and [place] Number:  Engineering geologist and Address:  [facility], [address], [place], [place] [address]      Provider Number: [id number]  Attending Physician Name and Address:  Lequita Halt, MD  Relative Name and Phone Number:       Current Level of Care: Hospital Recommended Level of Care: [place] Prior Approval Number:    Date Approved/Denied:   PASRR Number: [place] Must website is down.  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 Extra Gravy on meats, potatoes. Soups.  NO STRAWS!!!)  AMBULATORY STATUS COMMUNICATION OF NEEDS Skin   Extensive Assist Verbally Normal                       Personal Care Assistance Level of Assistance  Bathing, Feeding, Dressing Bathing Assistance: Maximum assistance Feeding assistance: Limited assistance Dressing Assistance: Maximum assistance     Functional Limitations Info  Sight, Hearing, Speech Sight Info: Adequate Hearing Info: Adequate Speech Info: Adequate    SPECIAL CARE FACTORS FREQUENCY  PT (By licensed PT), OT (By licensed OT)     PT Frequency: 5 x week OT Frequency: 5 x week            Contractures Contractures Info: Not present    Additional Factors Info  Code Status, Allergies Code Status Info: Full code Allergies Info: NKDA            Current Medications (01/08/2021):  This is the current hospital active medication list Current Facility-Administered Medications  Medication Dose Route Frequency Provider Last Rate Last Admin   acetaminophen (TYLENOL) tablet 650 mg  650 mg Oral Q6H PRN Wynetta Fines T, MD       Or   acetaminophen (TYLENOL) suppository 650 mg  650 mg Rectal Q6H PRN Wynetta Fines T, MD       acidophilus (RISAQUAD) capsule 2 capsule  2 capsule Oral TID Wynetta Fines T, MD   2 capsule at 01/08/21 1037   Ampicillin-Sulbactam (UNASYN) 3 g in sodium chloride 0.9 % 100 mL IVPB  3 g Intravenous Q6H Sharion Settler, NP   Stopped at 01/08/21 1153   bisacodyl (DULCOLAX) EC tablet 5 mg  5 mg Oral Daily PRN Wynetta Fines T, MD       docusate sodium (COLACE) capsule 100 mg  100 mg Oral BID Wynetta Fines T, MD   100 mg at 01/08/21 1037   heparin ADULT infusion 100 units/mL (25000 units/266mL)  1,650 Units/hr Intravenous Continuous Wynetta Fines T, MD 16.5 mL/hr at 01/08/21 0353 1,650 Units/hr at 01/08/21 0353   HYDROmorphone (DILAUDID) injection 0.5-1 mg  0.5-1 mg Intravenous Q2H PRN Wynetta Fines T, MD       metoprolol tartrate (LOPRESSOR) injection 5 mg  5 mg Intravenous Q4H PRN Lequita Halt, MD  ondansetron (ZOFRAN) tablet 4 mg  4 mg Oral Q6H PRN Wynetta Fines T, MD       Or   ondansetron [facility]) injection 4 mg  4 mg Intravenous Q6H PRN Wynetta Fines T, MD       pantoprazole (PROTONIX) EC tablet 40 mg  40 mg Oral Daily Wynetta Fines T, MD   40 mg at 01/08/21 1037     Discharge Medications: Please see discharge summary for a list of discharge medications.  Relevant Imaging Results:  Relevant Lab Results:   Additional Information SS#: [id number]. COVID vaccines: Pfizer 05/16/19, 05/30/19. Unsure about booster.  Candie Chroman, LCSW

## 2021-01-08 NOTE — Progress Notes (Signed)
Lab contacted to collect Heparin level.

## 2021-01-08 NOTE — Consult Note (Signed)
Cardiology Consultation:   Patient ID: Steven Welch MRN: [id number]; DOB: 13-Jun-1940  Admit date: 01/07/2021 Date of Consult: 01/08/2021  PCP:  Juluis Pitch, MD   [facility] Providers Cardiologist:  [place]  Patient Profile:   Steven Welch is a 80 y.o. male with a hx of HTN, HLD, GERD, OA of knees who is being seen 01/08/2021 for the evaluation of new onset afib at the request of Dr. Kurtis Bushman.  History of Present Illness:   Steven Welch has no significant prior cardiac history. Lives with his wife.  The patient presented to [facility] ED 01/07/21 for right face pain and weakness. Symptoms started a week ago. Had tooth pain and jaw pain. Denies chest pain, shortness of breath. No fever or shill.   In the ED he was noted to have low grade fever with borderline BP. Noted to be in afib RVR with rate of 130. WBC 15.2, potassium 3.6, sodium 133, Scr 1.18, BUN 31, albumin 3.4, normal LFTs, Hgb 14.2. LA 1.6. CT of the face showed right mandible soft tissue swelling and dental decay, no abscess. LA 1.6. Started on Unasyn, digoxin and IV heparin and admitted.    Past Medical History:  Diagnosis Date   Cyst of breast 1981   right   GERD (gastroesophageal reflux disease)    Hepatitis 1988   History of shingles    Hyperlipidemia    Hypertension    Osteoarthritis of right hip    Skin cancer of face     Past Surgical History:  Procedure Laterality Date   BREAST EXCISIONAL BIOPSY Right 1981   neg   COLON SURGERY     age 1 month old   TONSILLECTOMY     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   direct anterior approach   TOTAL HIP ARTHROPLASTY Right 01/01/2019   Procedure: TOTAL HIP ARTHROPLASTY ANTERIOR APPROACH;  Surgeon: Hessie Knows, MD;  Location: [facility] ORS;  Service: Orthopedics;  Laterality: Right;     Home Medications:  Prior to Admission medications   Medication Sig Start Date End Date Taking? Authorizing Provider  acetaminophen (TYLENOL) 650 MG CR tablet Take 650 mg by  mouth 2 (two) times daily.   Yes [provider]  amLODipine (NORVASC) 10 MG tablet Take 10 mg by mouth daily.   Yes [provider]  lisinopril-hydrochlorothiazide (ZESTORETIC) 20-12.5 MG tablet Take 2 tablets by mouth daily.   Yes [provider]  aspirin EC 81 MG tablet Take 81 mg by mouth daily. Patient not taking: Reported on 01/07/2021    [provider]  celecoxib (CELEBREX) 100 MG capsule Take 100 mg by mouth 2 (two) times daily. Patient not taking: Reported on 01/07/2021 07/27/20   [provider]  Oxymetazoline HCl (NASAL SPRAY) 0.05 % SOLN Place 1 spray into the nose every evening. Patient not taking: Reported on 01/07/2021    [provider]  zolpidem (AMBIEN) 5 MG tablet Take 5 mg by mouth at bedtime as needed for sleep. Patient not taking: Reported on 01/07/2021    [provider]    Inpatient Medications: Scheduled Meds:  acidophilus  2 capsule Oral TID   docusate sodium  100 mg Oral BID   pantoprazole  40 mg Oral Daily   Continuous Infusions:  ampicillin-sulbactam (UNASYN) IV Stopped (01/08/21 1153)   heparin 1,650 Units/hr (01/08/21 0353)   PRN Meds: acetaminophen **OR** acetaminophen, bisacodyl, HYDROmorphone (DILAUDID) injection, metoprolol tartrate, ondansetron **OR** ondansetron (ZOFRAN) IV  Allergies:   No Known Allergies  Social History:  Social History   Socioeconomic History   Marital status: Married    Spouse name: Not on file   Number of children: Not on file   Years of education: Not on file   Highest education level: Not on file  Occupational History   Not on file  Tobacco Use   Smoking status: Never   Smokeless tobacco: Never  Vaping Use   Vaping Use: Never used  Substance and Sexual Activity   Alcohol use: Not Currently   Drug use: Never   Sexual activity: Not on file  Other Topics Concern   Not on file  Social History Narrative   Not on file   Social Determinants of  Health   Financial Resource Strain: Not on file  Food Insecurity: Not on file  Transportation Needs: Not on file  Physical Activity: Not on file  Stress: Not on file  Social Connections: Not on file  Intimate Partner Violence: Not on file    Family History:    Family History  Problem Relation Age of Onset   CAD Mother    Lung cancer Father      ROS:  Please see the history of present illness.   All other ROS reviewed and negative.     Physical Exam/Data:   Vitals:   01/08/21 0615 01/08/21 0630 01/08/21 0700 01/08/21 1357  BP: 92/65 99/68 103/72 102/66  Pulse: 76 (!) 43 65 86  Resp:  17 18 16   Temp:    98 F (36.7 C)  TempSrc:   Oral Oral  SpO2: 93% 92% 93% 97%  Weight:      Height:        Intake/Output Summary (Last 24 hours) at 01/08/2021 1508 Last data filed at 01/08/2021 0121 Gross per 24 hour  Intake 1321.81 ml  Output --  Net 1321.81 ml   Last 3 Weights 01/07/2021 01/01/2019 12/24/2018  Weight (lbs) 211 lb 205 lb 0.4 oz 204 lb  Weight (kg) 95.709 kg 93 kg 92.534 kg     Body mass index is 30.28 kg/m.  General:  Well nourished, well developed, in no acute distress HEENT: normal Neck: no JVD Vascular: No carotid bruits; Distal pulses 2+ bilaterally Cardiac:  normal S1, S2; RRR; no murmur  Lungs:  clear to auscultation bilaterally, no wheezing, rhonchi or rales  Abd: soft, nontender, no hepatomegaly  Ext: no edema Musculoskeletal:  No deformities, BUE and BLE strength normal and equal Skin: warm and dry  Neuro:  CNs 2-12 intact, no focal abnormalities noted Psych:  Normal affect   EKG:  The EKG was personally reviewed and demonstrates:  Afib, RVR 130, RBBB Telemetry:  Telemetry was personally reviewed and demonstrates:  Afib HR 70s  Relevant CV Studies:  Echo ordered  Laboratory Data:  High Sensitivity Troponin:   Recent Labs  Lab 01/07/21 1050 01/07/21 1350  TROPONINIHS 14 13     Chemistry Recent Labs  Lab 01/07/21 1050 01/07/21 1350  01/08/21 0530  NA 133*  --  135  K 3.6  --  3.4*  CL 95*  --  104  CO2 27  --  27  GLUCOSE 127*  --  102*  BUN 31*  --  24*  CREATININE 1.18  --  0.90  CALCIUM 8.7*  --  7.7*  MG  --  1.7  --   GFRNONAA >60  --  >60  ANIONGAP 11  --  4*    Recent Labs  Lab 01/07/21 1050  PROT 6.8  ALBUMIN 3.4*  AST 20  ALT 19  ALKPHOS 40  BILITOT 0.9   Lipids No results for input(s): CHOL, TRIG, HDL, LABVLDL, LDLCALC, CHOLHDL in the last 168 hours.  Hematology Recent Labs  Lab 01/07/21 1050 01/08/21 0530  WBC 15.2* 10.1  RBC 4.16* 3.36*  HGB 14.2 11.6*  HCT 40.2 33.1*  MCV 96.6 98.5  MCH 34.1* 34.5*  MCHC 35.3 35.0  RDW 13.9 14.0  PLT 208 162   Thyroid  Recent Labs  Lab 01/07/21 1050  TSH 0.615    BNPNo results for input(s): BNP, PROBNP in the last 168 hours.  DDimer No results for input(s): DDIMER in the last 168 hours.   Radiology/Studies:  CT HEAD WO CONTRAST (5MM)  Result Date: 01/07/2021 CLINICAL DATA:  Delirium EXAM: CT HEAD WITHOUT CONTRAST TECHNIQUE: Contiguous axial images were obtained from the base of the skull through the vertex without intravenous contrast. COMPARISON:  None. FINDINGS: Brain: There is no acute intracranial hemorrhage, mass effect, or edema. Gray-white differentiation is preserved. Prominence of the ventricles and sulci reflects parenchymal volume loss. Disproportionate temporal volume loss. Relative prominence of the ventricles. Patchy and confluent hypoattenuation in the supratentorial white matter is nonspecific but probably reflects moderate chronic microvascular ischemic changes. There is no extra-axial fluid collection. Vascular: There is atherosclerotic calcification at the skull base. Skull: Calvarium is unremarkable. Sinuses/Orbits: No acute finding. Other: None. IMPRESSION: No acute intracranial hemorrhage or evidence of acute infarction. Moderate chronic microvascular ischemic changes. Parenchymal volume loss with disproportionate temporal  atrophy. Relative prominence of ventricles favored to be due to central volume loss. A component of communicating/normal pressure hydrocephalus is possible in the appropriate setting Electronically Signed   By: [name] M.D.   On: 01/07/2021 16:37   CT Maxillofacial W Contrast  Result Date: 01/07/2021 CLINICAL DATA:  Maxillary/facial abscess; technologist note states swelling and pain right jaw, fell yesterday EXAM: CT MAXILLOFACIAL WITH CONTRAST TECHNIQUE: Multidetector CT imaging of the maxillofacial structures was performed with intravenous contrast. Multiplanar CT image reconstructions were also generated. CONTRAST:  57mL OMNIPAQUE IOHEXOL 350 MG/ML SOLN COMPARISON:  None. FINDINGS: Osseous: No acute fracture. Orbits: Unremarkable. Sinuses: Minor mucosal thickening.  Mastoid air cells are clear. Soft tissues: Soft tissue swelling with fat infiltration along the body of the right mandible. Suboptimal visualization due to streak artifact. No evidence of abscess. Dental decay is noted including significantly decayed right mandibular posterior premolar and posterior molars with periapical lucency. Limited intracranial: No abnormal enhancement. Parenchymal volume loss. IMPRESSION: Soft tissue swelling along the body of the mandible on the right. No acute fracture. Significant right mandibular dental decay is noted and may be the source of above. There is no soft tissue abscess. Electronically Signed   By: [name] M.D.   On: 01/07/2021 12:20   DG Chest Port 1 View  Result Date: 01/07/2021 CLINICAL DATA:  Weakness.  Recent falls. EXAM: PORTABLE CHEST 1 VIEW COMPARISON:  None. FINDINGS: Heart size is normal. No pleural effusion or edema. Scarring is again noted within the left lower lobe. No superimposed airspace consolidation. Degenerative changes are noted within both glenohumeral joints. IMPRESSION: 1. No acute cardiopulmonary abnormalities. 2. 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  Electronically Signed   By: Kerby Moors M.D.   On: 01/07/2021 12:13     Assessment and Plan:   Sepsis - presented with tooth/jaw pain found to have tachycardia, fever, leukocytosis. Infectious source right tooth - WBC elevated. LA wnl - Blood cultures obtainted - BP  has been soft. Found ot be in rapid afib with heart rate in the 130s - IV abx per IM  New onset afib - New onset afib RVR in the setting of sepsis - heart rates on admission were up to 130s. HR now in the 70s - BP soft limiting treatment. He was given digoxin - Keep mag>2 and K>4 - TSH wnl - check echo - Hold antihypertensives held on admission for hypotension. If BP improves start BB for rate control - CHADSVASC at least 3 (HTN, agex2). He would require long-term a/c. He is on IV heparin. IF echo is unremarkable change to Eliquis.   Weakness/deconditioning - PT consulted  HTN - BP soft - PTA meds amlodipine 10mg  and Zestoric 12-12.5mg  daily held>>continue to hold  For questions or updates, please contact [place] Please consult [email or web address] for contact info under    Signed, Luccas Towell Ninfa Meeker, PA-C  01/08/2021 3:08 PM

## 2021-01-08 NOTE — Progress Notes (Signed)
PHARMACY - PHYSICIAN COMMUNICATION CRITICAL VALUE ALERT - BLOOD CULTURE IDENTIFICATION (BCID)  Steven Welch is an 80 y.o. male who presented to [facility] on 01/07/2021 with a chief complaint of sepsis, afib .  Assessment:  Staph species in 1 of 4 bottles (anaerobe) (include suspected source if known)  Name of physician (or Provider) Contacted: Rachael Fee  Current antibiotics: Unasyn 3 gm IV Q6H   Changes to prescribed antibiotics recommended:  No, probably contaminant  Results for orders placed or performed during the hospital encounter of 01/07/21  Blood Culture ID Panel (Reflexed) (Collected: 01/07/2021 11:03 AM)  Result Value Ref Range   Enterococcus faecalis NOT DETECTED NOT DETECTED   Enterococcus Faecium NOT DETECTED NOT DETECTED   Listeria monocytogenes NOT DETECTED NOT DETECTED   Staphylococcus species DETECTED (A) NOT DETECTED   Staphylococcus aureus (BCID) NOT DETECTED NOT DETECTED   Staphylococcus epidermidis NOT DETECTED NOT DETECTED   Staphylococcus lugdunensis NOT DETECTED NOT DETECTED   Streptococcus species NOT DETECTED NOT DETECTED   Streptococcus agalactiae NOT DETECTED NOT DETECTED   Streptococcus pneumoniae NOT DETECTED NOT DETECTED   Streptococcus pyogenes NOT DETECTED NOT DETECTED   A.calcoaceticus-baumannii NOT DETECTED NOT DETECTED   Bacteroides fragilis NOT DETECTED NOT DETECTED   Enterobacterales NOT DETECTED NOT DETECTED   Enterobacter cloacae complex NOT DETECTED NOT DETECTED   Escherichia coli NOT DETECTED NOT DETECTED   Klebsiella aerogenes NOT DETECTED NOT DETECTED   Klebsiella oxytoca NOT DETECTED NOT DETECTED   Klebsiella pneumoniae NOT DETECTED NOT DETECTED   Proteus species NOT DETECTED NOT DETECTED   Salmonella species NOT DETECTED NOT DETECTED   Serratia marcescens NOT DETECTED NOT DETECTED   Haemophilus influenzae NOT DETECTED NOT DETECTED   Neisseria meningitidis NOT DETECTED NOT DETECTED   Pseudomonas aeruginosa NOT DETECTED NOT  DETECTED   Stenotrophomonas maltophilia NOT DETECTED NOT DETECTED   Candida albicans NOT DETECTED NOT DETECTED   Candida auris NOT DETECTED NOT DETECTED   Candida glabrata NOT DETECTED NOT DETECTED   Candida krusei NOT DETECTED NOT DETECTED   Candida parapsilosis NOT DETECTED NOT DETECTED   Candida tropicalis NOT DETECTED NOT DETECTED   Cryptococcus neoformans/gattii NOT DETECTED NOT DETECTED    [name] D 01/08/2021  5:44 AM

## 2021-01-08 NOTE — TOC Initial Note (Signed)
Transition of Care [facility]) - Initial/Assessment Note    Patient Details  Name: Steven Welch MRN: [id number] Date of Birth: 26-Jul-1940  [facility]) CM/SW Contact:    Candie Chroman, LCSW Phone Number: 01/08/2021, 2:49 PM  Clinical Narrative:  CSW met with patient. No supports at bedside. CSW introduced role and explained that PT recommendations would be discussed. Patient asked if he could go home and then go to SNF if needed. CSW explained that transfer to SNF usually takes longer from home. He is agreeable to CSW sending out referral to referral to see what his options are. Left list of CMS scores in the room for facilities within 25 miles of his zip code. He will discuss plan with his wife and follow up on decision. No further concerns. CSW encouraged patient to contact CSW as needed. CSW will continue to follow patient for support and facilitate discharge to SNF, if agreeable, once medically stable.                Expected Discharge Plan: Skilled Nursing Facility Barriers to Discharge: Continued Medical Work up   Patient Goals and CMS Choice   CMS Medicare.gov Compare Post Acute Care list provided to:: Patient    Expected Discharge Plan and Services Expected Discharge Plan: [place] Choice: [place], [place] Living arrangements for the past 2 months: Single Family Home                                      Prior Living Arrangements/Services Living arrangements for the past 2 months: Single Family Home Lives with:: Spouse Patient language and need for interpreter reviewed:: Yes Do you feel safe going back to the place where you live?: Yes      Need for Family Participation in Patient Care: Yes (Comment) Care giver support system in place?: Yes (comment)   Criminal Activity/Legal Involvement Pertinent to Current Situation/Hospitalization: No - Comment as needed  Activities of Daily Living Home  Assistive Devices/Equipment: Gilford Rile (specify type) ADL Screening (condition at time of admission) Patient's cognitive ability adequate to safely complete daily activities?: Yes Is the patient deaf or have difficulty hearing?: Yes Does the patient have difficulty seeing, even when wearing glasses/contacts?: No Does the patient have difficulty concentrating, remembering, or making decisions?: No Patient able to express need for assistance with ADLs?: Yes Does the patient have difficulty dressing or bathing?: Yes Independently performs ADLs?: No Communication: Independent Dressing (OT): Needs assistance Is this a change from baseline?: Pre-admission baseline Grooming: Needs assistance Is this a change from baseline?: Pre-admission baseline Feeding: Independent Bathing: Needs assistance Is this a change from baseline?: Pre-admission baseline Toileting: Needs assistance Is this a change from baseline?: Change from baseline, expected to last <3 days In/Out Bed: Needs assistance Is this a change from baseline?: Change from baseline, expected to last <3 days Walks in Home: Independent with device (comment) Does the patient have difficulty walking or climbing stairs?: Yes Weakness of Legs: Both Weakness of Arms/Hands: Both  Permission Sought/Granted Permission sought to share information with : Facility Art therapist granted to share information with : Yes, Verbal Permission Granted     Permission granted to share info w AGENCY: SNF's        Emotional Assessment Appearance:: Appears stated age Attitude/Demeanor/Rapport: Engaged Affect (typically observed): Appropriate, Calm, Pleasant Orientation: : Oriented to Self,  Oriented to Place, Oriented to  Time, Oriented to Situation Alcohol / Substance Use: Not Applicable Psych Involvement: No (comment)  Admission diagnosis:  A-fib ([place]) [I48.91] Dental infection [K04.7] Atrial fibrillation with RVR ([place])  [I48.91] Sepsis without acute organ dysfunction, due to unspecified organism [facility]) [A41.9] Patient Active Problem List   Diagnosis Date Noted   Sepsis ([place]) 01/07/2021   A-fib ([place]) 01/07/2021   Status post total hip replacement, 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

## 2021-01-08 NOTE — Progress Notes (Signed)
*  PRELIMINARY RESULTS* Echocardiogram 2D Echocardiogram has been performed.  Steven Welch Steven Welch 01/08/2021, 1:28 PM

## 2021-01-08 NOTE — Consult Note (Signed)
ANTICOAGULATION CONSULT NOTE - Initial Consult  Pharmacy Consult for heparin Indication: atrial fibrillation  No Known Allergies  Patient Measurements: Height: 5\' 10"  (177.8 cm) Weight: 95.7 kg (211 lb) IBW/kg (Calculated) : 73 Heparin Dosing Weight: 92.6 kg  Vital Signs: Temp: 98 F (36.7 C) (10/14 1357) Temp Source: Oral (10/14 1357) BP: 102/66 (10/14 1357) Pulse Rate: 86 (10/14 1357)  Labs: Recent Labs    01/07/21 1050 01/07/21 1145 01/07/21 1350 01/07/21 2326 01/08/21 0530 01/08/21 0930 01/08/21 1756  HGB 14.2  --   --   --  11.6*  --   --   HCT 40.2  --   --   --  33.1*  --   --   PLT 208  --   --   --  162  --   --   APTT  --  32  --   --   --   --   --   LABPROT  --  14.1  --   --   --   --   --   INR  --  1.1  --   --   --   --   --   HEPARINUNFRC  --   --   --  <0.10*  --  0.32 0.29*  CREATININE 1.18  --   --   --  0.90  --   --   TROPONINIHS 14  --  13  --   --   --   --      Estimated Creatinine Clearance: 77.3 mL/min (by C-G formula based on SCr of 0.9 mg/dL).   Medical History: Past Medical History:  Diagnosis Date   Cyst of breast 1981   right   GERD (gastroesophageal reflux disease)    Hepatitis 1988   History of shingles    Hyperlipidemia    Hypertension    Osteoarthritis of right hip    Skin cancer of face     Medications:  Scheduled:   acidophilus  2 capsule Oral TID   docusate sodium  100 mg Oral BID   pantoprazole  40 mg Oral Daily    Assessment: 80yo M with PMH of HTN, HLD, and GERD who presented to the ED for weakness but was found to be in Afib w/ RVR. Pharmacy was consulted for heparin dosing.   No anticoagulants were noted on PTA med rec  Date/time HL Interpretation/comment 10/13 2326 <0.10 Subthera, bolus 2700 units, 1300>1650 units/hr  10/14 1002 0.32 Thera x1 10/14 1756 0.29 Subthera, bolus 1400 units, 1650>1850 units/hr  Goal of Therapy:  Heparin level 0.3-0.7 units/ml Monitor platelets by anticoagulation  protocol: Yes   Plan:  Heparin subtherapeutic, will bolus 1400 units and increase rate to 1850 units/hr. Check HL in 8 hours after rate change. Continue to monitor H&H and platelets with daily CBC while on heparin   Sherilyn Banker, PharmD Pharmacy Resident  01/08/2021 6:33 PM

## 2021-01-08 NOTE — Progress Notes (Signed)
PROGRESS NOTE    Steven Welch  IPJ:[id number] DOB: Jun 13, 1940 DOA: 01/07/2021 PCP: Juluis Pitch, MD    Brief Narrative:  Steven Welch is a 80 y.o. male with medical history significant of HTN, HLD, GERD, OA of knees, came with increasing right facial swelling and pain and generalized weakness.   Symptoms started one week ago, with a increasing right mandibular tooth ache, then left jaw pain and swelling, no problem swelling normal food and no problem breathing. Episodes of chills but no fever, no chest pain or palpitations. Last 2-3 days, right jaw pain and swelling became worse, with increasing generalized weakness. Still no fever at home.   ED Course: Low fever 17f 100.5 F, afib with RVR, BP borderline low. WBC 15.2. CT of face showed right mandible soft tissue swelling and dental dental decay, no abscess seen. Lactic acid=1.6.   10/14 cards consulted this am. He does report to me this past week he has only been getting 2 hrs of sleep each night and has been tired. He did fall getting out of bed, but reports likely due to lack of sleep . Denies any sx prior to fall such as dizziness, palpitations, lightheadedness.   Consultants:    Procedures:  CT head: No acute intracranial hemorrhage or evidence of acute infarction.   Moderate chronic microvascular ischemic changes.   Parenchymal volume loss with disproportionate temporal atrophy.   Relative prominence of ventricles favored to be due to central volume loss. A component of communicating/normal pressure hydrocephalus is possible in the appropriate setting     CT maxofac: Soft tissue swelling along the body of the mandible on the right. No acute fracture.   Significant right mandibular dental decay is noted and may be the source of above. There is no soft tissue abscess.    Antimicrobials:  unasyn   Subjective: Pt denies sob, cp, dizziness. Cant tell me if facial swelling down . +pain on right jaw. No trouble  swallowing  Objective: Vitals:   01/08/21 0545 01/08/21 0615 01/08/21 0630 01/08/21 0700  BP: 96/65 92/65 99/68  103/72  Pulse: 70 76 (!) 43 65  Resp:   17 18  Temp:      TempSrc:    Oral  SpO2: 96% 93% 92% 93%  Weight:      Height:        Intake/Output Summary (Last 24 hours) at 01/08/2021 0918 Last data filed at 01/08/2021 0121 Gross per 24 hour  Intake 3421.81 ml  Output --  Net 3421.81 ml   Filed Weights   01/07/21 1047  Weight: 95.7 kg    Examination:  General exam: Appears calm and comfortable  HEENT: Lt face /jaw swollen, mildly tender to touch. Warmth. Submandibular swelling.  Respiratory effort normal. Cta no w/r/r Cardiovascular system: S1 & S2 heard, reg. Irreg, no gallop Gastrointestinal system: Abdomen is nondistended, soft and nontender. Normal bowel sounds heard. Central nervous system: Alert and oriented.grossly intact Extremities: no edema Psychiatry: Judgement and insight appear normal. Mood & affect appropriate.     Data Reviewed: I have personally reviewed following labs and imaging studies  CBC: Recent Labs  Lab 01/07/21 1050 01/08/21 0530  WBC 15.2* 10.1  NEUTROABS 12.8*  --   HGB 14.2 11.6*  HCT 40.2 33.1*  MCV 96.6 98.5  PLT 208 734   Basic Metabolic Panel: Recent Labs  Lab 01/07/21 1050 01/07/21 1350 01/08/21 0530  NA 133*  --  135  K 3.6  --  3.4*  CL 95*  --  104  CO2 27  --  27  GLUCOSE 127*  --  102*  BUN 31*  --  24*  CREATININE 1.18  --  0.90  CALCIUM 8.7*  --  7.7*  MG  --  1.7  --   PHOS  --  2.3*  --    GFR: Estimated Creatinine Clearance: 77.3 mL/min (by C-G formula based on SCr of 0.9 mg/dL). Liver Function Tests: Recent Labs  Lab 01/07/21 1050  AST 20  ALT 19  ALKPHOS 40  BILITOT 0.9  PROT 6.8  ALBUMIN 3.4*   No results for input(s): LIPASE, AMYLASE in the last 168 hours. No results for input(s): AMMONIA in the last 168 hours. Coagulation Profile: Recent Labs  Lab 01/07/21 1145  INR 1.1    Cardiac Enzymes: No results for input(s): CKTOTAL, CKMB, CKMBINDEX, TROPONINI in the last 168 hours. BNP (last 3 results) No results for input(s): PROBNP in the last 8760 hours. HbA1C: No results for input(s): HGBA1C in the last 72 hours. CBG: No results for input(s): GLUCAP in the last 168 hours. Lipid Profile: No results for input(s): CHOL, HDL, LDLCALC, TRIG, CHOLHDL, LDLDIRECT in the last 72 hours. Thyroid Function Tests: Recent Labs    01/07/21 1050  TSH 0.615   Anemia Panel: Recent Labs    01/07/21 1050  TIBC 238*  IRON 10*   Sepsis Labs: Recent Labs  Lab 01/07/21 1145  LATICACIDVEN 1.6    Recent Results (from the past 240 hour(s))  Blood Culture (routine x 2)     Status: None (Preliminary result)   Collection Time: 01/07/21 11:03 AM   Specimen: BLOOD  Result Value Ref Range Status   Specimen Description BLOOD LEFT AF  Final   Special Requests   Final    BOTTLES DRAWN AEROBIC AND ANAEROBIC Blood Culture adequate volume   Culture  Setup Time   Final    GRAM POSITIVE COCCI ANAEROBIC BOTTLE ONLY Organism ID to follow CRITICAL RESULT CALLED TO, READ BACK BY AND VERIFIED WITH: JASON ROBBINS 01/08/21 0526 SLM Performed at [facility], [address] [place], [place] [address]    Culture GRAM POSITIVE COCCI  Final   Report Status PENDING  Incomplete  Blood Culture ID Panel (Reflexed)     Status: Abnormal   Collection Time: 01/07/21 11:03 AM  Result Value Ref Range Status   Enterococcus faecalis NOT DETECTED NOT DETECTED Final   Enterococcus Faecium NOT DETECTED NOT DETECTED Final   Listeria monocytogenes NOT DETECTED NOT DETECTED Final   Staphylococcus species DETECTED (A) NOT DETECTED Final    Comment: CRITICAL RESULT CALLED TO, READ BACK BY AND VERIFIED WITH: JASON ROBBINS 01/08/21 0526 SLM    Staphylococcus aureus (BCID) NOT DETECTED NOT DETECTED Final   Staphylococcus epidermidis NOT DETECTED NOT DETECTED Final   Staphylococcus  lugdunensis NOT DETECTED NOT DETECTED Final   Streptococcus species NOT DETECTED NOT DETECTED Final   Streptococcus agalactiae NOT DETECTED NOT DETECTED Final   Streptococcus pneumoniae NOT DETECTED NOT DETECTED Final   Streptococcus pyogenes NOT DETECTED NOT DETECTED Final   A.calcoaceticus-baumannii NOT DETECTED NOT DETECTED Final   Bacteroides fragilis NOT DETECTED NOT DETECTED Final   Enterobacterales NOT DETECTED NOT DETECTED Final   Enterobacter cloacae complex NOT DETECTED NOT DETECTED Final   Escherichia coli NOT DETECTED NOT DETECTED Final   Klebsiella aerogenes NOT DETECTED NOT DETECTED Final   Klebsiella oxytoca NOT DETECTED NOT DETECTED Final   Klebsiella pneumoniae NOT DETECTED NOT DETECTED Final   Proteus  species NOT DETECTED NOT DETECTED Final   Salmonella species NOT DETECTED NOT DETECTED Final   Serratia marcescens NOT DETECTED NOT DETECTED Final   Haemophilus influenzae NOT DETECTED NOT DETECTED Final   Neisseria meningitidis NOT DETECTED NOT DETECTED Final   Pseudomonas aeruginosa NOT DETECTED NOT DETECTED Final   Stenotrophomonas maltophilia NOT DETECTED NOT DETECTED Final   Candida albicans NOT DETECTED NOT DETECTED Final   Candida auris NOT DETECTED NOT DETECTED Final   Candida glabrata NOT DETECTED NOT DETECTED Final   Candida krusei NOT DETECTED NOT DETECTED Final   Candida parapsilosis NOT DETECTED NOT DETECTED Final   Candida tropicalis NOT DETECTED NOT DETECTED Final   Cryptococcus neoformans/gattii NOT DETECTED NOT DETECTED Final    Comment: Performed at [facility], [place], [place] [address]  Resp Panel by RT-PCR (Flu A&B, Covid) Nasopharyngeal Swab     Status: None   Collection Time: 01/07/21 11:45 AM   Specimen: Nasopharyngeal Swab; Nasopharyngeal(NP) swabs in vial transport medium  Result Value Ref Range Status   SARS Coronavirus 2 by RT PCR NEGATIVE NEGATIVE Final    Comment: (NOTE) SARS-CoV-2 target nucleic acids are NOT  DETECTED.  The SARS-CoV-2 RNA is generally detectable in upper respiratory specimens during the acute phase of infection. The lowest concentration of SARS-CoV-2 viral copies this assay can detect is 138 copies/mL. A negative result does not preclude SARS-Cov-2 infection and should not be used as the sole basis for treatment or other patient management decisions. A negative result may occur with  improper specimen collection/handling, submission of specimen other than nasopharyngeal swab, presence of viral mutation(s) within the areas targeted by this assay, and inadequate number of viral copies(<138 copies/mL). A negative result must be combined with clinical observations, patient history, and epidemiological information. The expected result is Negative.  Fact Sheet for Patients:  EntrepreneurPulse.com.au  Fact Sheet for Healthcare Providers:  IncredibleEmployment.be  This test is no t yet approved or cleared by the [place] FDA and  has been authorized for detection and/or diagnosis of SARS-CoV-2 by FDA under an Emergency Use Authorization (EUA). This EUA will remain  in effect (meaning this test can be used) for the duration of the COVID-19 declaration under Section 564(b)(1) of the Act, 21 U.S.C.section 360bbb-3(b)(1), unless the authorization is terminated  or revoked sooner.       Influenza A by PCR NEGATIVE NEGATIVE Final   Influenza B by PCR NEGATIVE NEGATIVE Final    Comment: (NOTE) The Xpert Xpress SARS-CoV-2/FLU/RSV plus assay is intended as an aid in the diagnosis of influenza from Nasopharyngeal swab specimens and should not be used as a sole basis for treatment. Nasal washings and aspirates are unacceptable for Xpert Xpress SARS-CoV-2/FLU/RSV testing.  Fact Sheet for Patients: EntrepreneurPulse.com.au  Fact Sheet for Healthcare Providers: IncredibleEmployment.be  This test is not yet  approved or cleared by the [place] FDA and has been authorized for detection and/or diagnosis of SARS-CoV-2 by FDA under an Emergency Use Authorization (EUA). This EUA will remain in effect (meaning this test can be used) for the duration of the COVID-19 declaration under Section 564(b)(1) of the Act, 21 U.S.C. section 360bbb-3(b)(1), unless the authorization is terminated or revoked.  Performed at [facility], [address] [place], [place] [address]   Blood Culture (routine x 2)     Status: None (Preliminary result)   Collection Time: 01/07/21 11:45 AM   Specimen: BLOOD  Result Value Ref Range Status   Specimen Description BLOOD  BLOOD RIGHT FOREARM  Final   Special Requests   Final    BOTTLES DRAWN AEROBIC AND ANAEROBIC Blood Culture results may not be optimal due to an inadequate volume of blood received in culture bottles   Culture   Final    NO GROWTH < 24 HOURS Performed at [facility], [address], [place], [place] [address]    Report Status PENDING  Incomplete         Radiology Studies: CT HEAD WO CONTRAST (5MM)  Result Date: 01/07/2021 CLINICAL DATA:  [place]irium EXAM: CT HEAD WITHOUT CONTRAST TECHNIQUE: Contiguous axial images were obtained from the base of the skull through the vertex without intravenous contrast. COMPARISON:  None. FINDINGS: Brain: There is no acute intracranial hemorrhage, mass effect, or edema. Gray-white differentiation is preserved. Prominence of the ventricles and sulci reflects parenchymal volume loss. Disproportionate temporal volume loss. Relative prominence of the ventricles. Patchy and confluent hypoattenuation in the supratentorial white matter is nonspecific but probably reflects moderate chronic microvascular ischemic changes. There is no extra-axial fluid collection. Vascular: There is atherosclerotic calcification at the skull base. Skull: Calvarium is unremarkable. Sinuses/Orbits: No acute finding. Other:  None. IMPRESSION: No acute intracranial hemorrhage or evidence of acute infarction. Moderate chronic microvascular ischemic changes. Parenchymal volume loss with disproportionate temporal atrophy. Relative prominence of ventricles favored to be due to central volume loss. A component of communicating/normal pressure hydrocephalus is possible in the appropriate setting Electronically Signed   By: [name] M.D.   On: 01/07/2021 16:37   CT Maxillofacial W Contrast  Result Date: 01/07/2021 CLINICAL DATA:  Maxillary/facial abscess; technologist note states swelling and pain right jaw, fell yesterday EXAM: CT MAXILLOFACIAL WITH CONTRAST TECHNIQUE: Multidetector CT imaging of the maxillofacial structures was performed with intravenous contrast. Multiplanar CT image reconstructions were also generated. CONTRAST:  56mL OMNIPAQUE IOHEXOL 350 MG/ML SOLN COMPARISON:  None. FINDINGS: Osseous: No acute fracture. Orbits: Unremarkable. Sinuses: Minor mucosal thickening.  Mastoid air cells are clear. Soft tissues: Soft tissue swelling with fat infiltration along the body of the right mandible. Suboptimal visualization due to streak artifact. No evidence of abscess. Dental decay is noted including significantly decayed right mandibular posterior premolar and posterior molars with periapical lucency. Limited intracranial: No abnormal enhancement. Parenchymal volume loss. IMPRESSION: Soft tissue swelling along the body of the mandible on the right. No acute fracture. Significant right mandibular dental decay is noted and may be the source of above. There is no soft tissue abscess. Electronically Signed   By: [name] M.D.   On: 01/07/2021 12:20   DG Chest Port 1 View  Result Date: 01/07/2021 CLINICAL DATA:  Weakness.  Recent falls. EXAM: PORTABLE CHEST 1 VIEW COMPARISON:  None. FINDINGS: Heart size is normal. No pleural effusion or edema. Scarring is again noted within the left lower lobe. No superimposed airspace  consolidation. Degenerative changes are noted within both glenohumeral joints. IMPRESSION: 1. No acute cardiopulmonary abnormalities. 2. 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 Electronically Signed   By: Kerby Moors M.D.   On: 01/07/2021 12:13        Scheduled Meds:  acidophilus  2 capsule Oral TID   digoxin  0.125 mg Intravenous Q6H   docusate sodium  100 mg Oral BID   pantoprazole  40 mg Oral Daily   Continuous Infusions:  sodium chloride 100 mL/hr at 01/08/21 0121   ampicillin-sulbactam (UNASYN) IV Stopped (01/08/21 0151)   heparin 1,650 Units/hr (01/08/21 0353)    Assessment & Plan:  Active Problems:   Sepsis ([place])   A-fib ([place])   Sepsis Likely due to submandibular cellulitis/infection.  Ct without abscess. Has tooth decay, was suppose to f/u with dentist soon. Continue IV Unasyn    New onset of afib with RVR -CHADS2=2 -Reviewed patient's PMH, no Hx of GI bleed, no Hx of stroke and no anemia. Start Heparin drip, check FOBT and repeat H/H in AM. If H/H stable, can go home with Eliquis and f/u with afib clinic. -BP borderline low, can be attributed to sepsis and uncontrolled HR/afib. Will load patient with digoxin 0.125 mg Q6H x 4 doses, and PRN lopressor. If BP improves, can consider start PO beta blocker. -Make K>4.0 and Mg>2, Mg level pending. 10/14 cardiology was consulted. Echo pending Check TSH Heart rate currently controlled Currently on heparin drip   Deconditioning Will consult PT OT   HTN Hold home BP meds as BP borderline      GERD Continue PPI  Hypokalemia- K 3.4 Will replace   DVT prophylaxis: Heparin drip Code Status: Full Family Communication: None at bedside Disposition Plan:  Status is: Inpatient  Remains inpatient appropriate because:Inpatient level of care appropriate due to severity of illness   Dispo: The patient is from: Home              Anticipated d/c is to: Home              Patient currently is not medically stable  to d/c.              Difficult to place patient No            LOS: 1 day   Time spent: 45 minutes with more than 50% on Annetta North, MD [facility] Pager [phone]  If 7PM-7AM, please contact night-coverage 01/08/2021, 9:18 AM

## 2021-01-08 NOTE — Consult Note (Signed)
ANTICOAGULATION CONSULT NOTE - Initial Consult  Pharmacy Consult for heparin Indication: atrial fibrillation  No Known Allergies  Patient Measurements: Height: 5\' 10"  (177.8 cm) Weight: 95.7 kg (211 lb) IBW/kg (Calculated) : 73 Heparin Dosing Weight: 92.6  Vital Signs: Temp Source: Oral (10/14 0700) BP: 103/72 (10/14 0700) Pulse Rate: 65 (10/14 0700)  Labs: Recent Labs    01/07/21 1050 01/07/21 1145 01/07/21 1350 01/07/21 2326 01/08/21 0530  HGB 14.2  --   --   --  11.6*  HCT 40.2  --   --   --  33.1*  PLT 208  --   --   --  162  APTT  --  32  --   --   --   LABPROT  --  14.1  --   --   --   INR  --  1.1  --   --   --   HEPARINUNFRC  --   --   --  <0.10*  --   CREATININE 1.18  --   --   --  0.90  TROPONINIHS 14  --  13  --   --      Estimated Creatinine Clearance: 77.3 mL/min (by C-G formula based on SCr of 0.9 mg/dL).   Medical History: Past Medical History:  Diagnosis Date   Cyst of breast 1981   right   GERD (gastroesophageal reflux disease)    Hepatitis 1988   History of shingles    Hyperlipidemia    Hypertension    Osteoarthritis of right hip    Skin cancer of face     Medications:  Scheduled:   acidophilus  2 capsule Oral TID   digoxin  0.125 mg Intravenous Q6H   docusate sodium  100 mg Oral BID   pantoprazole  40 mg Oral Daily    Assessment: 80yo M with PMH of HTN, HLD, and GERD who presented to the ED for weakness but was found to be in Afib w/ RVR. Pharmacy was consulted for heparin dosing.   No anticoagulants were noted on PTA med rec  Date/time HL Interpretation/comment 10/13 2326 <0.10 Subthera, bolus 2700 units, 1300>1650 units/hr  10/14 1002 0.32 Thera x1  Goal of Therapy:  Heparin level 0.3-0.7 units/ml Monitor platelets by anticoagulation protocol: Yes   Plan:  Heparin therapeutic, will continue current rate of 1650units/hr Check HL in 8 hours to confirm therapeutic  Continue to monitor H&H and platelets with daily CBC  while on heparin   Narda Rutherford, PharmD Pharmacy Resident  01/08/2021 9:59 AM

## 2021-01-08 NOTE — Progress Notes (Signed)
Patient arrived to unit without distress from ED. MD Gollan at bedside to assess patient at time of arrival. Patient connected to cardiac monitoring and rhythm reviewed by [facility].  All patient needs met at this time. Will continue to monitor closely.

## 2021-01-08 NOTE — ED Notes (Signed)
Informed RN bed assigned 

## 2021-01-08 NOTE — Evaluation (Signed)
Physical Therapy Evaluation Patient Details Name: Steven Welch MRN: [id number] DOB: Nov 18, 1940 Today's Date: 01/08/2021  History of Present Illness  Kaveh Kissinger is a 80 y.o. male with past medical history of hypertension, hyperlipidemia, and GERD who presents to the ED complaining of weakness.    Clinical Impression  Pt in supine position upon arrival and agreeable to therapy.  Pt attempted to perform bed mobility, however is extremely weak in all extremities and core musculature.  Pt is able to transfers, however due to instability, ambulation was not attempted at this time.  D/c options discussed with wife and pt and noted that due to imbalance and home setup, SNF would be the recommendation at this time.  Pt will benefit from skilled PT intervention to increase independence and safety with basic mobility in preparation for discharge to the venue listed below.        Recommendations for follow up therapy are one component of a multi-disciplinary discharge planning process, led by the attending physician.  Recommendations may be updated based on patient status, additional functional criteria and insurance authorization.  Follow Up Recommendations SNF    Equipment Recommendations  Rolling walker with 5" wheels    Recommendations for Other Services       Precautions / Restrictions Precautions Precautions: None Restrictions Weight Bearing Restrictions: No      Mobility  Bed Mobility Overal bed mobility: Needs Assistance Bed Mobility: Supine to Sit     Supine to sit: Max assist     General bed mobility comments: Pt requires max A from therapist via HHA in order to come upright into sitting position.    Transfers Overall transfer level: Needs assistance Equipment used: Rolling walker (2 wheeled) Transfers: Sit to/from Stand Sit to Stand: Min assist         General transfer comment: Pt requires VC's for technique and hand placement.  Ambulation/Gait              General Gait Details: deferred due to generalized instability in standing/sitting.  Stairs            Wheelchair Mobility    Modified Rankin (Stroke Patients Only)       Balance Overall balance assessment: Needs assistance Sitting-balance support: Single extremity supported;Feet supported Sitting balance-Leahy Scale: Poor Sitting balance - Comments: Pt has posterior lean and has to have UE support and verbal cuing to remain upright. Postural control: Posterior lean Standing balance support: Bilateral upper extremity supported Standing balance-Leahy Scale: Poor                               Pertinent Vitals/Pain Pain Assessment: 0-10 Pain Score: 6  Pain Location: R Arm Pain Descriptors / Indicators: Aching Pain Intervention(s): Limited activity within patient's tolerance;Monitored during session    [place] expects to be discharged to:: Private residence Living Arrangements: Spouse/significant other Available Help at Discharge: Family;Available 24 hours/day Type of Home: House Home Access: Stairs to enter Entrance Stairs-Rails: None Entrance Stairs-Number of Steps: 2 Home Layout: One level Home Equipment: Cane - single point;Toilet riser Additional Comments: Pt utilizes SPC in the R hand    Prior Function Level of Independence: Independent with assistive device(s)         Comments: Pt reports his wife was assisting him with ADL's the few days prior to coming to the hospital.     Hand Dominance   Dominant Hand: Right    Extremity/Trunk Assessment  Upper Extremity Assessment Upper Extremity Assessment: Generalized weakness;RUE deficits/detail RUE Deficits / Details: Pt notes pain and weakness in the R UE due to arthritis    Lower Extremity Assessment Lower Extremity Assessment: Generalized weakness    Cervical / Trunk Assessment Cervical / Trunk Assessment: Kyphotic  Communication   Communication: No difficulties   Cognition Arousal/Alertness: Awake/alert Behavior During Therapy: WFL for tasks assessed/performed Overall Cognitive Status: Within Functional Limits for tasks assessed                                        General Comments      Exercises Other Exercises Other Exercises: Pt educated on role of PT and services provided during hospital stay.   Assessment/Plan    PT Assessment Patient needs continued PT services  PT Problem List Decreased strength;Decreased activity tolerance;Decreased balance;Decreased mobility;Decreased knowledge of use of DME;Decreased safety awareness       PT Treatment Interventions Gait training;Stair training;Functional mobility training;Therapeutic activities;Therapeutic exercise;Balance training;Neuromuscular re-education    PT Goals (Current goals can be found in the Care Plan section)  Acute Rehab PT Goals Patient Stated Goal: to get stronger PT Goal Formulation: With patient/family Time For Goal Achievement: 01/22/21 Potential to Achieve Goals: Fair    Frequency Min 2X/week   Barriers to discharge Decreased caregiver support;Inaccessible home environment Pt's wife is not able to lift pt after a fall or assist with transfers, and pt currently has 2 steps to enter his home without railing that he would not be able to perform at this time.    Co-evaluation               AM-PAC PT "6 Clicks" Mobility  Outcome Measure Help needed turning from your back to your side while in a flat bed without using bedrails?: A Lot Help needed moving from lying on your back to sitting on the side of a flat bed without using bedrails?: A Lot Help needed moving to and from a bed to a chair (including a wheelchair)?: A Lot Help needed standing up from a chair using your arms (e.g., wheelchair or bedside chair)?: A Little Help needed to walk in hospital room?: A Lot Help needed climbing 3-5 steps with a railing? : Total 6 Click Score: 12    End  of Session Equipment Utilized During Treatment: Gait belt;Oxygen Activity Tolerance: Patient tolerated treatment well Patient left: in bed;with call bell/phone within reach;with nursing/sitter in room;with family/visitor present Nurse Communication: Mobility status PT Visit Diagnosis: Unsteadiness on feet (R26.81);Repeated falls (R29.6);Muscle weakness (generalized) (M62.81);History of falling (Z91.81)    Time: 4742-5956 PT Time Calculation (min) (ACUTE ONLY): 44 min   Charges:   PT Evaluation $PT Eval Low Complexity: 1 Low PT Treatments $Therapeutic Activity: 23-37 mins        Gwenlyn Saran, PT, DPT 01/08/21, 10:54 AM   Christie Nottingham 01/08/2021, 10:48 AM

## 2021-01-09 DIAGNOSIS — I4819 Other persistent atrial fibrillation: Secondary | ICD-10-CM

## 2021-01-09 DIAGNOSIS — I10 Essential (primary) hypertension: Secondary | ICD-10-CM

## 2021-01-09 DIAGNOSIS — L03211 Cellulitis of face: Secondary | ICD-10-CM

## 2021-01-09 LAB — CBC
HCT: 33.1 % — ABNORMAL LOW (ref 39.0–52.0)
Hemoglobin: 11.3 g/dL — ABNORMAL LOW (ref 13.0–17.0)
MCH: 33.4 pg (ref 26.0–34.0)
MCHC: 34.1 g/dL (ref 30.0–36.0)
MCV: 97.9 fL (ref 80.0–100.0)
Platelets: 172 10*3/uL (ref 150–400)
RBC: 3.38 MIL/uL — ABNORMAL LOW (ref 4.22–5.81)
RDW: 13.5 % (ref 11.5–15.5)
WBC: 7.1 10*3/uL (ref 4.0–10.5)
nRBC: 0 % (ref 0.0–0.2)

## 2021-01-09 LAB — BASIC METABOLIC PANEL
Anion gap: 8 (ref 5–15)
BUN: 20 mg/dL (ref 8–23)
CO2: 27 mmol/L (ref 22–32)
Calcium: 8 mg/dL — ABNORMAL LOW (ref 8.9–10.3)
Chloride: 101 mmol/L (ref 98–111)
Creatinine, Ser: 0.85 mg/dL (ref 0.61–1.24)
GFR, Estimated: >60 mL/min (ref >60)
Glucose, Bld: 94 mg/dL (ref 70–99)
Potassium: 3.1 mmol/L — ABNORMAL LOW (ref 3.5–5.1)
Sodium: 136 mmol/L (ref 135–145)

## 2021-01-09 LAB — HEPARIN LEVEL (UNFRACTIONATED)
Heparin Unfractionated: 0.42 IU/mL (ref 0.30–0.70)
Heparin Unfractionated: 0.61 IU/mL (ref 0.30–0.70)

## 2021-01-09 LAB — MAGNESIUM: Magnesium: 2.1 mg/dL (ref 1.7–2.4)

## 2021-01-09 LAB — DIGOXIN LEVEL: Digoxin Level: 0.4 ng/mL — ABNORMAL LOW (ref 0.8–2.0)

## 2021-01-09 LAB — PHOSPHORUS: Phosphorus: 3 mg/dL (ref 2.5–4.6)

## 2021-01-09 MED ORDER — APIXABAN 5 MG PO TABS
5.0000 mg | ORAL_TABLET | Freq: Two times a day (BID) | ORAL | Status: DC
  Administered 2021-01-09 – 2021-01-11 (×4): 5 mg via ORAL
  Filled 2021-01-09 (×4): qty 1

## 2021-01-09 MED ORDER — POTASSIUM CHLORIDE CRYS ER 20 MEQ PO TBCR
80.0000 meq | EXTENDED_RELEASE_TABLET | Freq: Once | ORAL | Status: AC
  Administered 2021-01-09: 80 meq via ORAL
  Filled 2021-01-09: qty 4

## 2021-01-09 MED ORDER — POTASSIUM CHLORIDE CRYS ER 20 MEQ PO TBCR
40.0000 meq | EXTENDED_RELEASE_TABLET | Freq: Once | ORAL | Status: AC
  Administered 2021-01-09: 40 meq via ORAL
  Filled 2021-01-09: qty 2

## 2021-01-09 MED ORDER — MIDODRINE HCL 5 MG PO TABS
5.0000 mg | ORAL_TABLET | Freq: Three times a day (TID) | ORAL | Status: DC
  Administered 2021-01-09 – 2021-01-11 (×7): 5 mg via ORAL
  Filled 2021-01-09 (×7): qty 1

## 2021-01-09 MED ORDER — POTASSIUM CHLORIDE 10 MEQ/100ML IV SOLN
10.0000 meq | INTRAVENOUS | Status: AC
Start: 2021-01-09 — End: 2021-01-09
  Administered 2021-01-09 (×2): 10 meq via INTRAVENOUS
  Filled 2021-01-09 (×2): qty 100

## 2021-01-09 MED ORDER — BACID PO TABS: 2.0000 | ORAL_TABLET | Freq: Three times a day (TID) | ORAL | Status: DC

## 2021-01-09 MED ORDER — LACTATED RINGERS IV SOLN: INTRAVENOUS | Status: DC

## 2021-01-09 NOTE — Consult Note (Signed)
ANTICOAGULATION CONSULT NOTE - Initial Consult  Pharmacy Consult for heparin Indication: atrial fibrillation  No Known Allergies  Patient Measurements: Height: 5\' 10"  (177.8 cm) Weight: 95.7 kg (211 lb) IBW/kg (Calculated) : 73 Heparin Dosing Weight: 92.6 kg  Vital Signs: Temp: 97.8 F (36.6 C) (10/15 0026) Temp Source: Oral (10/14 1950) BP: 108/73 (10/15 0026) Pulse Rate: 72 (10/15 0026)  Labs: Recent Labs    01/07/21 1050 01/07/21 1145 01/07/21 1350 01/07/21 2326 01/08/21 0530 01/08/21 0930 01/08/21 1756 01/09/21 0259  HGB 14.2  --   --   --  11.6*  --   --  11.3*  HCT 40.2  --   --   --  33.1*  --   --  33.1*  PLT 208  --   --   --  162  --   --  172  APTT  --  32  --   --   --   --   --   --   LABPROT  --  14.1  --   --   --   --   --   --   INR  --  1.1  --   --   --   --   --   --   HEPARINUNFRC  --   --   --    < >  --  0.32 0.29* 0.42  CREATININE 1.18  --   --   --  0.90  --   --   --   TROPONINIHS 14  --  13  --   --   --   --   --    < > = values in this interval not displayed.     Estimated Creatinine Clearance: 77.3 mL/min (by C-G formula based on SCr of 0.9 mg/dL).   Medical History: Past Medical History:  Diagnosis Date   Cyst of breast 1981   right   GERD (gastroesophageal reflux disease)    Hepatitis 1988   History of shingles    Hyperlipidemia    Hypertension    Osteoarthritis of right hip    Skin cancer of face     Medications:  Scheduled:   acidophilus  2 capsule Oral TID   docusate sodium  100 mg Oral BID   pantoprazole  40 mg Oral Daily    Assessment: 80yo M with PMH of HTN, HLD, and GERD who presented to the ED for weakness but was found to be in Afib w/ RVR. Pharmacy was consulted for heparin dosing.   No anticoagulants were noted on PTA med rec  Date/time HL Interpretation/comment 10/13 2326 <0.10 Subthera, bolus 2700 units, 1300>1650 units/hr  10/14 1002 0.32 Thera x1 10/14 1756 0.29 Subthera, bolus 1400 units,  1650>1850 units/hr  Goal of Therapy:  Heparin level 0.3-0.7 units/ml Monitor platelets by anticoagulation protocol: Yes   Plan:  10/15:  HL @ 0259 = 0.42, therapeutic X 1  Will continue pt on current rate and draw confirmation level on 10/15 @ 1100.  01/09/2021 3:35 AM

## 2021-01-09 NOTE — Evaluation (Signed)
Occupational Therapy Evaluation Patient Details Name: Steven Welch MRN: [id number] DOB: Jan 07, 1941 Today's Date: 01/09/2021   History of Present Illness Steven Welch is a 80 y.o. male with past medical history of hypertension, hyperlipidemia, and GERD who presents to the ED complaining of weakness and R jaw swelling. Pt adm for sepsis and is also being w/u for Afib.   Clinical Impression   Pt seen for OT evaluation this date in setting of acute hospitalization d/t sepsis. Pt presents this date reporting he is feeling better. States he is INDEP with ADLs at baseline. He presents this date with decreased fxl activity tolerance, general deconditioning and decreased R cervical region ROM including limited ability to rotate neck to R side. Currently requiring: SETUP to MIN A for seated UB ADLs, MIN/MOD A for seated LB ADLs and MIN A for ADL transfers and fxl mobility with RW.     Recommendations for follow up therapy are one component of a multi-disciplinary discharge planning process, led by the attending physician.  Recommendations may be updated based on patient status, additional functional criteria and insurance authorization.   Follow Up Recommendations  Home health OT    Equipment Recommendations  3 in 1 bedside commode;Tub/shower seat;Other (comment) (2ww)    Recommendations for Other Services       Precautions / Restrictions Precautions Precautions: None Restrictions Weight Bearing Restrictions: No      Mobility Bed Mobility Overal bed mobility: Needs Assistance Bed Mobility: Supine to Sit     Supine to sit: Min assist;HOB elevated     General bed mobility comments: increased time    Transfers Overall transfer level: Needs assistance Equipment used: Rolling walker (2 wheeled) Transfers: Sit to/from Stand           General transfer comment: MIN A to CTS from EOB with bari RW with cues for sequence of hand placement    Balance Overall balance assessment: Needs  assistance   Sitting balance-Leahy Scale: Fair       Standing balance-Leahy Scale: Fair                             ADL either performed or assessed with clinical judgement   ADL Overall ADL's : Needs assistance/impaired                                       General ADL Comments: requires SETUP to MIN A for seated UB ADLs, MIN/MOD A for seated LB ADLs and MIN A for ADL transfers and fxl mobility with RW.     Vision Patient Visual Report: No change from baseline       Perception     Praxis      Pertinent Vitals/Pain Pain Assessment: 0-10 Pain Score: 5  Pain Location: R shld/neck/jaw Pain Descriptors / Indicators: Aching Pain Intervention(s): Limited activity within patient's tolerance;Monitored during session     Hand Dominance Right   Extremity/Trunk Assessment Upper Extremity Assessment Upper Extremity Assessment: Generalized weakness;RUE deficits/detail RUE Deficits / Details: decreased tolerance for R shoulder ROM and noted to be visibly swollen although pt reports the swelling seems to be better, but still painful   Lower Extremity Assessment Lower Extremity Assessment: Defer to PT evaluation;Generalized weakness   Cervical / Trunk Assessment Cervical / Trunk Assessment: Kyphotic   Communication Communication Communication: No difficulties   Cognition Arousal/Alertness: Awake/alert Behavior During  Therapy: WFL for tasks assessed/performed Overall Cognitive Status: Within Functional Limits for tasks assessed                                     General Comments       Exercises Other Exercises Other Exercises: OT engages pt in ed re: role of OT in acute setting as well as importance of OOB Activity, pt with good reception.   Shoulder Instructions      Home Living Family/patient expects to be discharged to:: Private residence Living Arrangements: Spouse/significant other Available Help at Discharge:  Family;Available 24 hours/day Type of Home: House Home Access: Stairs to enter [employer] of Steps: 2 Entrance Stairs-Rails: None Home Layout: One level     Bathroom Shower/Tub: Occupational psychologist: Standard     Home Equipment: Cane - single point;Toilet riser   Additional Comments: Pt utilizes SPC in the R hand      Prior Functioning/Environment Level of Independence: Independent with assistive device(s)        Comments: Pt reports his wife was assisting him with ADL's the few days prior to coming to the hospital, but he is typically able to do all ADLs I'ly        OT Problem List: Decreased strength;Decreased range of motion;Decreased activity tolerance;Decreased knowledge of use of DME or AE;Pain      OT Treatment/Interventions: Self-care/ADL training;Therapeutic exercise;Therapeutic activities;DME and/or AE instruction    OT Goals(Current goals can be found in the care plan section) Acute Rehab OT Goals Patient Stated Goal: to get stronger OT Goal Formulation: With patient Time For Goal Achievement: 01/23/21 Potential to Achieve Goals: Good  OT Frequency: Min 1X/week   Barriers to D/C:            Co-evaluation              AM-PAC OT "6 Clicks" Daily Activity     Outcome Measure Help from another person eating meals?: None Help from another person taking care of personal grooming?: A Little Help from another person toileting, which includes using toliet, bedpan, or urinal?: A Little Help from another person bathing (including washing, rinsing, drying)?: A Lot Help from another person to put on and taking off regular upper body clothing?: A Little Help from another person to put on and taking off regular lower body clothing?: A Lot 6 Click Score: 17   End of Session Equipment Utilized During Treatment: Gait belt;Rolling walker Nurse Communication: Mobility status  Activity Tolerance: Patient tolerated treatment well Patient  left: in chair;with call bell/phone within reach;with chair alarm set  OT Visit Diagnosis: Unsteadiness on feet (R26.81);Muscle weakness (generalized) (M62.81);History of falling (Z91.81)                Time: 0165-5374 OT Time Calculation (min): 33 min Charges:  OT General Charges $OT Visit: 1 Visit OT Evaluation $OT Eval Moderate Complexity: 1 Mod OT Treatments $Self Care/Home Management : 8-22 mins  Gerrianne Scale, MS, OTR/L ascom [phone] 01/09/21, 5:18 PM

## 2021-01-09 NOTE — Progress Notes (Addendum)
Progress Note  Patient Name: Steven Welch Date of Encounter: 01/09/2021  [facility] Cardiologist: New  Subjective   In rate controlled afib. Telel shows some pauses overnight, all less than 3 second.s. BP still soft. Digoxin stopped and started on midodrine. Patient says he is feeling better today, less jaw pain.   Inpatient Medications    Scheduled Meds:  acidophilus  2 capsule Oral TID   docusate sodium  100 mg Oral BID   midodrine  5 mg Oral TID WC   pantoprazole  40 mg Oral Daily   Continuous Infusions:  sodium chloride 250 mL (01/09/21 0931)   ampicillin-sulbactam (UNASYN) IV 3 g (01/09/21 0932)   heparin 1,850 Units/hr (01/08/21 1850)   lactated ringers 50 mL/hr at 01/09/21 1013   potassium chloride 10 mEq (01/09/21 1015)   PRN Meds: sodium chloride, acetaminophen **OR** acetaminophen, bisacodyl, HYDROmorphone (DILAUDID) injection, ondansetron **OR** ondansetron (ZOFRAN) IV   Vital Signs    Vitals:   01/08/21 1950 01/09/21 0026 01/09/21 0419 01/09/21 0728  BP: 102/69 108/73 99/64 (!) 97/55  Pulse: (!) 109 72 73 63  Resp: 20 17 20 18   Temp: 98.9 F (37.2 C) 97.8 F (36.6 C) 98.7 F (37.1 C) 97.8 F (36.6 C)  TempSrc: Oral     SpO2: 96% 95% 91% 99%  Weight:      Height:        Intake/Output Summary (Last 24 hours) at 01/09/2021 1107 Last data filed at 01/09/2021 1025 Gross per 24 hour  Intake 1385.28 ml  Output --  Net 1385.28 ml   Last 3 Weights 01/07/2021 01/01/2019 12/24/2018  Weight (lbs) 211 lb 205 lb 0.4 oz 204 lb  Weight (kg) 95.709 kg 93 kg 92.534 kg      Telemetry    Afib HR 50s060s, intermittent pauses less than 3 seconds - Personally Reviewed  ECG    No new - Personally Reviewed  Physical Exam   GEN: No acute distress.   Neck: No JVD Cardiac: Irreg Irreg, no murmurs, rubs, or gallops.  Respiratory: Clear to auscultation bilaterally. GI: Soft, nontender, non-distended  MS: No edema; No deformity. Neuro:  Nonfocal  Psych:  Normal affect   Labs    High Sensitivity Troponin:   Recent Labs  Lab 01/07/21 1050 01/07/21 1350  TROPONINIHS 14 13     Chemistry Recent Labs  Lab 01/07/21 1050 01/07/21 1350 01/08/21 0530 01/09/21 0653  NA 133*  --  135 136  K 3.6  --  3.4* 3.1*  CL 95*  --  104 101  CO2 27  --  27 27  GLUCOSE 127*  --  102* 94  BUN 31*  --  24* 20  CREATININE 1.18  --  0.90 0.85  CALCIUM 8.7*  --  7.7* 8.0*  MG  --  1.7  --  2.1  PROT 6.8  --   --   --   ALBUMIN 3.4*  --   --   --   AST 20  --   --   --   ALT 19  --   --   --   ALKPHOS 40  --   --   --   BILITOT 0.9  --   --   --   GFRNONAA >60  --  >60 >60  ANIONGAP 11  --  4* 8    Lipids No results for input(s): CHOL, TRIG, HDL, LABVLDL, LDLCALC, CHOLHDL in the last 168 hours.  Hematology Recent Labs  Lab 01/07/21 1050 01/08/21 0530 01/09/21 0259  WBC 15.2* 10.1 7.1  RBC 4.16* 3.36* 3.38*  HGB 14.2 11.6* 11.3*  HCT 40.2 33.1* 33.1*  MCV 96.6 98.5 97.9  MCH 34.1* 34.5* 33.4  MCHC 35.3 35.0 34.1  RDW 13.9 14.0 13.5  PLT 208 162 172   Thyroid  Recent Labs  Lab 01/07/21 1050  TSH 0.615    BNPNo results for input(s): BNP, PROBNP in the last 168 hours.  DDimer No results for input(s): DDIMER in the last 168 hours.   Radiology    CT HEAD WO CONTRAST (5MM)  Result Date: 01/07/2021 CLINICAL DATA:  Delirium EXAM: CT HEAD WITHOUT CONTRAST TECHNIQUE: Contiguous axial images were obtained from the base of the skull through the vertex without intravenous contrast. COMPARISON:  None. FINDINGS: Brain: There is no acute intracranial hemorrhage, mass effect, or edema. Gray-white differentiation is preserved. Prominence of the ventricles and sulci reflects parenchymal volume loss. Disproportionate temporal volume loss. Relative prominence of the ventricles. Patchy and confluent hypoattenuation in the supratentorial white matter is nonspecific but probably reflects moderate chronic microvascular ischemic changes. There is no  extra-axial fluid collection. Vascular: There is atherosclerotic calcification at the skull base. Skull: Calvarium is unremarkable. Sinuses/Orbits: No acute finding. Other: None. IMPRESSION: No acute intracranial hemorrhage or evidence of acute infarction. Moderate chronic microvascular ischemic changes. Parenchymal volume loss with disproportionate temporal atrophy. Relative prominence of ventricles favored to be due to central volume loss. A component of communicating/normal pressure hydrocephalus is possible in the appropriate setting Electronically Signed   By: [name] M.D.   On: 01/07/2021 16:37   CT Maxillofacial W Contrast  Result Date: 01/07/2021 CLINICAL DATA:  Maxillary/facial abscess; technologist note states swelling and pain right jaw, fell yesterday EXAM: CT MAXILLOFACIAL WITH CONTRAST TECHNIQUE: Multidetector CT imaging of the maxillofacial structures was performed with intravenous contrast. Multiplanar CT image reconstructions were also generated. CONTRAST:  67mL OMNIPAQUE IOHEXOL 350 MG/ML SOLN COMPARISON:  None. FINDINGS: Osseous: No acute fracture. Orbits: Unremarkable. Sinuses: Minor mucosal thickening.  Mastoid air cells are clear. Soft tissues: Soft tissue swelling with fat infiltration along the body of the right mandible. Suboptimal visualization due to streak artifact. No evidence of abscess. Dental decay is noted including significantly decayed right mandibular posterior premolar and posterior molars with periapical lucency. Limited intracranial: No abnormal enhancement. Parenchymal volume loss. IMPRESSION: Soft tissue swelling along the body of the mandible on the right. No acute fracture. Significant right mandibular dental decay is noted and may be the source of above. There is no soft tissue abscess. Electronically Signed   By: [name] M.D.   On: 01/07/2021 12:20   DG Chest Port 1 View  Result Date: 01/07/2021 CLINICAL DATA:  Weakness.  Recent falls. EXAM:  PORTABLE CHEST 1 VIEW COMPARISON:  None. FINDINGS: Heart size is normal. No pleural effusion or edema. Scarring is again noted within the left lower lobe. No superimposed airspace consolidation. Degenerative changes are noted within both glenohumeral joints. IMPRESSION: 1. No acute cardiopulmonary abnormalities. 2. 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 Electronically Signed   By: Kerby Moors M.D.   On: 01/07/2021 12:13   ECHOCARDIOGRAM COMPLETE  Result Date: 01/08/2021    ECHOCARDIOGRAM REPORT   Patient Name:   KEDRIC BUMGARNER Date of Exam: 01/08/2021 Medical Rec #:  [id number]    Height:       70.0 in Accession #:    [id number]   Weight:       211.0 lb Date  of Birth:  1940/08/11     BSA:          2.135 m Patient Age:    52 years     BP:           103/72 mmHg Patient Gender: M            HR:           63 bpm. Exam Location:  [facility] Procedure: 2D Echo, Color Doppler and Cardiac Doppler Indications:     R94.31 Abnormal ECG  History:         Patient has no prior history of Echocardiogram examinations.                  Risk Factors:Hypertension and Dyslipidemia.  Sonographer:     Charmayne Sheer Referring Phys:  [id number] Lequita Halt Diagnosing Phys: Ida Rogue MD  Sonographer Comments: Suboptimal parasternal window and suboptimal subcostal window. Image acquisition challenging due to patient body habitus. IMPRESSIONS  1. Left ventricular ejection fraction, by estimation, is 60 to 65%. The left ventricle has normal function. The left ventricle has no regional wall motion abnormalities. There is mild left ventricular hypertrophy. Left ventricular diastolic parameters are indeterminate.  2. Right ventricular systolic function is normal. The right ventricular size is normal.  3. The mitral valve is normal in structure. Mild mitral valve regurgitation. No evidence of mitral stenosis.  4. Rhythm is atrial fibrillation FINDINGS  Left Ventricle: Left ventricular ejection fraction, by estimation, is 60 to 65%. The left  ventricle has normal function. The left ventricle has no regional wall motion abnormalities. The left ventricular internal cavity size was normal in size. There is  mild left ventricular hypertrophy. Left ventricular diastolic parameters are indeterminate. Right Ventricle: The right ventricular size is normal. No increase in right ventricular wall thickness. Right ventricular systolic function is normal. Left Atrium: Left atrial size was normal in size. Right Atrium: Right atrial size was normal in size. Pericardium: There is no evidence of pericardial effusion. Mitral Valve: The mitral valve is normal in structure. Mild mitral valve regurgitation. No evidence of mitral valve stenosis. MV peak gradient, 4.9 mmHg. The mean mitral valve gradient is 2.0 mmHg. Tricuspid Valve: The tricuspid valve is normal in structure. Tricuspid valve regurgitation is not demonstrated. No evidence of tricuspid stenosis. Aortic Valve: The aortic valve was not well visualized. Aortic valve regurgitation is not visualized. No aortic stenosis is present. Aortic valve mean gradient measures 3.0 mmHg. Aortic valve peak gradient measures 5.0 mmHg. Aortic valve area, by VTI measures 3.42 cm. Pulmonic Valve: The pulmonic valve was normal in structure. Pulmonic valve regurgitation is trivial. No evidence of pulmonic stenosis. Aorta: The aortic root is normal in size and structure. Venous: The inferior vena cava is normal in size with greater than 50% respiratory variability, suggesting right atrial pressure of 3 mmHg. IAS/Shunts: No atrial level shunt detected by color flow Doppler.  LEFT VENTRICLE PLAX 2D LVIDd:         4.60 cm   Diastology LVIDs:         3.60 cm   LV e' medial:    10.90 cm/s LV PW:         0.90 cm   LV E/e' medial:  8.1 LV IVS:        0.80 cm   LV e' lateral:   16.80 cm/s LVOT diam:     2.40 cm   LV E/e' lateral: 5.3 LV SV:  71 LV SV Index:   33 LVOT Area:     4.52 cm  RIGHT VENTRICLE RV Basal diam:  3.60 cm RV S  prime:     13.70 cm/s LEFT ATRIUM           Index        RIGHT ATRIUM           Index LA diam:      2.40 cm 1.12 cm/m   RA Area:     14.40 cm LA Vol (A4C): 35.8 ml 16.77 ml/m  RA Volume:   31.40 ml  14.70 ml/m  AORTIC VALVE                    PULMONIC VALVE AV Area (Vmax):    3.66 cm     PV Vmax:       0.78 m/s AV Area (Vmean):   3.57 cm     PV Vmean:      52.200 cm/s AV Area (VTI):     3.42 cm     PV VTI:        0.150 m AV Vmax:           112.00 cm/s  PV Peak grad:  2.4 mmHg AV Vmean:          77.100 cm/s  PV Mean grad:  1.0 mmHg AV VTI:            0.209 m AV Peak Grad:      5.0 mmHg AV Mean Grad:      3.0 mmHg LVOT Vmax:         90.70 cm/s LVOT Vmean:        60.900 cm/s LVOT VTI:          0.158 m LVOT/AV VTI ratio: 0.76  AORTA Ao Root diam: 3.40 cm MITRAL VALVE MV Area (PHT): 2.68 cm    SHUNTS MV Area VTI:   3.90 cm    Systemic VTI:  0.16 m MV Peak grad:  4.9 mmHg    Systemic Diam: 2.40 cm MV Mean grad:  2.0 mmHg MV Vmax:       1.11 m/s MV Vmean:      66.5 cm/s MV Decel Time: 283 msec MV E velocity: 88.80 cm/s Ida Rogue MD Electronically signed by Ida Rogue MD Signature Date/Time: 01/08/2021/5:08:34 PM    Final     Cardiac Studies   Echo 01/09/21  1. Left ventricular ejection fraction, by estimation, is 60 to 65%. The  left ventricle has normal function. The left ventricle has no regional  wall motion abnormalities. There is mild left ventricular hypertrophy.  Left ventricular diastolic parameters  are indeterminate.   2. Right ventricular systolic function is normal. The right ventricular  size is normal.   3. The mitral valve is normal in structure. Mild mitral valve  regurgitation. No evidence of mitral stenosis.   4. Rhythm is atrial fibrillation    Patient Profile     80 y.o. male of HTN, HLD, GERD, OA of knees who is being seen 01/08/2021 for the evaluation of new onset afib.  Assessment & Plan    Sepsis - presented with tooth/jaw pain found to have tachycardia,  fever, leukocytosis. Infectious source right tooth - WBC elevated. LA wnl - Blood cultures obtainted - BP has been soft. Found ot be in rapid afib with heart rate in the 130s - IV abx per IM   New onset afib - New onset afib  RVR in the setting of sepsis - heart rates on admission were up to 130s.  - antihypertensives held on admission for hypotension.  - digoxin stopped and patient started on midodrine 5mg  TID for persistent hypotension - Keep mag>2 and K>4 - TSH wnl - Echo showed LVEF 60-65%, no WMA, mild LVH, mild MR -  If BP improves start BB for rate control - CHADSVASC at least 3 (HTN, agex2). He would require long-term a/c. He is on IV heparin. Since echo is unremarkable change to Eliquis.    Weakness/deconditioning - PT consulted   HTN - PTA meds amlodipine 10mg  and Zestoric 12-12.5mg  daily held on admission - BP soft now on midodrine  For questions or updates, please contact [facility] Please consult [email or web address] for contact info under        Signed, Cadence Ninfa Meeker, PA-C  01/09/2021, 11:07 AM     I have seen and examined this patient with Cadence Furth.  Agree with above, note added to reflect my findings.  On exam, irregular, no murmurs.  Patient remains in rate controlled atrial fibrillation.  He is not on any rate controlling medications.  He has been switched to Eliquis.  Continue digoxin and midodrine for hypotension.  As long as he remains rate controlled, no further cardiology work-up is needed.  He Rozann Holts need follow-up in cardiology clinic.  Jakhiya Brower M. Reis Pienta MD 01/09/2021 12:14 PM

## 2021-01-09 NOTE — Progress Notes (Signed)
PROGRESS NOTE    Steven Welch  IEP:[id number] DOB: [birth year] DOA: 01/07/2021 PCP: Juluis Pitch, MD    Brief Narrative:  Steven Welch is a 80 y.o. male with medical history significant of HTN, HLD, GERD, OA of knees, came with increasing right facial swelling and pain and generalized weakness.   Symptoms started one week ago, with a increasing right mandibular tooth ache, then left jaw pain and swelling, no problem swelling normal food and no problem breathing. Episodes of chills but no fever, no chest pain or palpitations. Last 2-3 days, right jaw pain and swelling became worse, with increasing generalized weakness. Still no fever at home.   ED Course: Low fever 52f 100.5 F, afib with RVR, BP borderline low. WBC 15.2. CT of face showed right mandible soft tissue swelling and dental dental decay, no abscess seen. Lactic acid=1.6.   10/14 cards consulted this am. He does report to me this past week he has only been getting 2 hrs of sleep each night and has been tired. He did fall getting out of bed, but reports likely due to lack of sleep . Denies any sx prior to fall such as dizziness, palpitations, lightheadedness.  10/15 feels like his swelling has improved mildly.  Now is able to his right face since swelling Telemetry heart rate 40s to 50s. Discussed with Dr. Curt Bears this am. Pt asx.and it was at rest   Consultants:  Cardiology  Procedures:  CT head: No acute intracranial hemorrhage or evidence of acute infarction.   Moderate chronic microvascular ischemic changes.   Parenchymal volume loss with disproportionate temporal atrophy.   Relative prominence of ventricles favored to be due to central volume loss. A component of communicating/normal pressure hydrocephalus is possible in the appropriate setting     CT maxofac: Soft tissue swelling along the body of the mandible on the right. No acute fracture.   Significant right mandibular dental decay is noted and may be  the source of above. There is no soft tissue abscess.    Antimicrobials:  unasyn   Subjective: No difficulty swallowing, no shortness of breath  Objective: Vitals:   01/08/21 1950 01/09/21 0026 01/09/21 0419 01/09/21 0728  BP: 102/69 108/73 99/64 (!) 97/55  Pulse: (!) 109 72 73 63  Resp: 20 17 20 18   Temp: 98.9 F (37.2 C) 97.8 F (36.6 C) 98.7 F (37.1 C) 97.8 F (36.6 C)  TempSrc: Oral     SpO2: 96% 95% 91% 99%  Weight:      Height:        Intake/Output Summary (Last 24 hours) at 01/09/2021 6606 Last data filed at 01/09/2021 0740 Gross per 24 hour  Intake 785.28 ml  Output --  Net 785.28 ml   Filed Weights   01/07/21 1047  Weight: 95.7 kg    Examination: Calm, NAD Right submandibular/lower face decreased swelling, adenopathy improving Irregular S1-S2 no gallops Soft benign positive bowel sounds No edema [id number]   Data Reviewed: I have personally reviewed following labs and imaging studies  CBC: Recent Labs  Lab 01/07/21 1050 01/08/21 0530 01/09/21 0259  WBC 15.2* 10.1 7.1  NEUTROABS 12.8*  --   --   HGB 14.2 11.6* 11.3*  HCT 40.2 33.1* 33.1*  MCV 96.6 98.5 97.9  PLT 208 162 301   Basic Metabolic Panel: Recent Labs  Lab 01/07/21 1050 01/07/21 1350 01/08/21 0530 01/09/21 0653  NA 133*  --  135 136  K 3.6  --  3.4* 3.1*  CL 95*  --  104 101  CO2 27  --  27 27  GLUCOSE 127*  --  102* 94  BUN 31*  --  24* 20  CREATININE 1.18  --  0.90 0.85  CALCIUM 8.7*  --  7.7* 8.0*  MG  --  1.7  --  2.1  PHOS  --  2.3*  --  3.0   GFR: Estimated Creatinine Clearance: 81.8 mL/min (by C-G formula based on SCr of 0.85 mg/dL). Liver Function Tests: Recent Labs  Lab 01/07/21 1050  AST 20  ALT 19  ALKPHOS 40  BILITOT 0.9  PROT 6.8  ALBUMIN 3.4*   No results for input(s): LIPASE, AMYLASE in the last 168 hours. No results for input(s): AMMONIA in the last 168 hours. Coagulation Profile: Recent Labs  Lab 01/07/21 1145  INR 1.1   Cardiac  Enzymes: No results for input(s): CKTOTAL, CKMB, CKMBINDEX, TROPONINI in the last 168 hours. BNP (last 3 results) No results for input(s): PROBNP in the last 8760 hours. HbA1C: No results for input(s): HGBA1C in the last 72 hours. CBG: No results for input(s): GLUCAP in the last 168 hours. Lipid Profile: No results for input(s): CHOL, HDL, LDLCALC, TRIG, CHOLHDL, LDLDIRECT in the last 72 hours. Thyroid Function Tests: Recent Labs    01/07/21 1050  TSH 0.615   Anemia Panel: Recent Labs    01/07/21 1050  TIBC 238*  IRON 10*   Sepsis Labs: Recent Labs  Lab 01/07/21 1145  LATICACIDVEN 1.6    Recent Results (from the past 240 hour(s))  Blood Culture (routine x 2)     Status: None (Preliminary result)   Collection Time: 01/07/21 11:03 AM   Specimen: BLOOD  Result Value Ref Range Status   Specimen Description   Final    BLOOD LEFT AF Performed at [facility], [address], [place], [place] [address]    Special Requests   Final    BOTTLES DRAWN AEROBIC AND ANAEROBIC Blood Culture adequate volume Performed at [facility], [address] [place], [place] [address]    Culture  Setup Time   Final    GRAM POSITIVE COCCI ANAEROBIC BOTTLE ONLY CRITICAL RESULT CALLED TO, READ BACK BY AND VERIFIED WITH: JASON ROBBINS 01/08/21 0526 SLM Performed at [facility], [place] [address] [place],  [address]    Culture GRAM POSITIVE COCCI  Final   Report Status PENDING  Incomplete  Blood Culture ID Panel (Reflexed)     Status: Abnormal   Collection Time: 01/07/21 11:03 AM  Result Value Ref Range Status   Enterococcus faecalis NOT DETECTED NOT DETECTED Final   Enterococcus Faecium NOT DETECTED NOT DETECTED Final   Listeria monocytogenes NOT DETECTED NOT DETECTED Final   Staphylococcus species DETECTED (A) NOT DETECTED Final    Comment: CRITICAL RESULT CALLED TO, READ BACK BY AND VERIFIED WITH: JASON ROBBINS 01/08/21 0526 SLM    Staphylococcus  aureus (BCID) NOT DETECTED NOT DETECTED Final   Staphylococcus epidermidis NOT DETECTED NOT DETECTED Final   Staphylococcus lugdunensis NOT DETECTED NOT DETECTED Final   Streptococcus species NOT DETECTED NOT DETECTED Final   Streptococcus agalactiae NOT DETECTED NOT DETECTED Final   Streptococcus pneumoniae NOT DETECTED NOT DETECTED Final   Streptococcus pyogenes NOT DETECTED NOT DETECTED Final   A.calcoaceticus-baumannii NOT DETECTED NOT DETECTED Final   Bacteroides fragilis NOT DETECTED NOT DETECTED Final   Enterobacterales NOT DETECTED NOT DETECTED Final   Enterobacter cloacae complex NOT DETECTED NOT DETECTED Final   Escherichia coli  NOT DETECTED NOT DETECTED Final   Klebsiella aerogenes NOT DETECTED NOT DETECTED Final   Klebsiella oxytoca NOT DETECTED NOT DETECTED Final   Klebsiella pneumoniae NOT DETECTED NOT DETECTED Final   Proteus species NOT DETECTED NOT DETECTED Final   Salmonella species NOT DETECTED NOT DETECTED Final   Serratia marcescens NOT DETECTED NOT DETECTED Final   Haemophilus influenzae NOT DETECTED NOT DETECTED Final   Neisseria meningitidis NOT DETECTED NOT DETECTED Final   Pseudomonas aeruginosa NOT DETECTED NOT DETECTED Final   Stenotrophomonas maltophilia NOT DETECTED NOT DETECTED Final   Candida albicans NOT DETECTED NOT DETECTED Final   Candida auris NOT DETECTED NOT DETECTED Final   Candida glabrata NOT DETECTED NOT DETECTED Final   Candida krusei NOT DETECTED NOT DETECTED Final   Candida parapsilosis NOT DETECTED NOT DETECTED Final   Candida tropicalis NOT DETECTED NOT DETECTED Final   Cryptococcus neoformans/gattii NOT DETECTED NOT DETECTED Final    Comment: Performed at [facility], [place], [place] [address]  Resp Panel by RT-PCR (Flu A&B, Covid) Nasopharyngeal Swab     Status: None   Collection Time: 01/07/21 11:45 AM   Specimen: Nasopharyngeal Swab; Nasopharyngeal(NP) swabs in vial transport medium  Result Value Ref  Range Status   SARS Coronavirus 2 by RT PCR NEGATIVE NEGATIVE Final    Comment: (NOTE) SARS-CoV-2 target nucleic acids are NOT DETECTED.  The SARS-CoV-2 RNA is generally detectable in upper respiratory specimens during the acute phase of infection. The lowest concentration of SARS-CoV-2 viral copies this assay can detect is 138 copies/mL. A negative result does not preclude SARS-Cov-2 infection and should not be used as the sole basis for treatment or other patient management decisions. A negative result may occur with  improper specimen collection/handling, submission of specimen other than nasopharyngeal swab, presence of viral mutation(s) within the areas targeted by this assay, and inadequate number of viral copies(<138 copies/mL). A negative result must be combined with clinical observations, patient history, and epidemiological information. The expected result is Negative.  Fact Sheet for Patients:  EntrepreneurPulse.com.au  Fact Sheet for Healthcare Providers:  IncredibleEmployment.be  This test is no t yet approved or cleared by the [place] FDA and  has been authorized for detection and/or diagnosis of SARS-CoV-2 by FDA under an Emergency Use Authorization (EUA). This EUA will remain  in effect (meaning this test can be used) for the duration of the COVID-19 declaration under Section 564(b)(1) of the Act, 21 U.S.C.section 360bbb-3(b)(1), unless the authorization is terminated  or revoked sooner.       Influenza A by PCR NEGATIVE NEGATIVE Final   Influenza B by PCR NEGATIVE NEGATIVE Final    Comment: (NOTE) The Xpert Xpress SARS-CoV-2/FLU/RSV plus assay is intended as an aid in the diagnosis of influenza from Nasopharyngeal swab specimens and should not be used as a sole basis for treatment. Nasal washings and aspirates are unacceptable for Xpert Xpress SARS-CoV-2/FLU/RSV testing.  Fact Sheet for  Patients: EntrepreneurPulse.com.au  Fact Sheet for Healthcare Providers: IncredibleEmployment.be  This test is not yet approved or cleared by the [place] FDA and has been authorized for detection and/or diagnosis of SARS-CoV-2 by FDA under an Emergency Use Authorization (EUA). This EUA will remain in effect (meaning this test can be used) for the duration of the COVID-19 declaration under Section 564(b)(1) of the Act, 21 U.S.C. section 360bbb-3(b)(1), unless the authorization is terminated or revoked.  Performed at [facility], [address] [place], [place] [address]  Blood Culture (routine x 2)     Status: None (Preliminary result)   Collection Time: 01/07/21 11:45 AM   Specimen: BLOOD  Result Value Ref Range Status   Specimen Description BLOOD BLOOD RIGHT FOREARM  Final   Special Requests   Final    BOTTLES DRAWN AEROBIC AND ANAEROBIC Blood Culture results may not be optimal due to an inadequate volume of blood received in culture bottles   Culture   Final    NO GROWTH 2 DAYS Performed at [facility], [address], [place], [place] [address]    Report Status PENDING  Incomplete  Urine Culture     Status: None   Collection Time: 01/07/21 11:45 AM   Specimen: In/Out Cath Urine  Result Value Ref Range Status   Specimen Description   Final    IN/OUT CATH URINE Performed at [facility], [address], [place], [place] [address]    Special Requests   Final    NONE Performed at [facility], [address], [place], [place] [address]    Culture   Final    NO GROWTH Performed at [facility], [place] [address], [place], [place] [address]    Report Status 01/08/2021 FINAL  Final         Radiology Studies: CT HEAD WO CONTRAST (5MM)  Result Date: 01/07/2021 CLINICAL DATA:  Delirium EXAM: CT HEAD WITHOUT CONTRAST TECHNIQUE: Contiguous axial images were obtained from the base of  the skull through the vertex without intravenous contrast. COMPARISON:  None. FINDINGS: Brain: There is no acute intracranial hemorrhage, mass effect, or edema. Gray-white differentiation is preserved. Prominence of the ventricles and sulci reflects parenchymal volume loss. Disproportionate temporal volume loss. Relative prominence of the ventricles. Patchy and confluent hypoattenuation in the supratentorial white matter is nonspecific but probably reflects moderate chronic microvascular ischemic changes. There is no extra-axial fluid collection. Vascular: There is atherosclerotic calcification at the skull base. Skull: Calvarium is unremarkable. Sinuses/Orbits: No acute finding. Other: None. IMPRESSION: No acute intracranial hemorrhage or evidence of acute infarction. Moderate chronic microvascular ischemic changes. Parenchymal volume loss with disproportionate temporal atrophy. Relative prominence of ventricles favored to be due to central volume loss. A component of communicating/normal pressure hydrocephalus is possible in the appropriate setting Electronically Signed   By: [name] M.D.   On: 01/07/2021 16:37   CT Maxillofacial W Contrast  Result Date: 01/07/2021 CLINICAL DATA:  Maxillary/facial abscess; technologist note states swelling and pain right jaw, fell yesterday EXAM: CT MAXILLOFACIAL WITH CONTRAST TECHNIQUE: Multidetector CT imaging of the maxillofacial structures was performed with intravenous contrast. Multiplanar CT image reconstructions were also generated. CONTRAST:  77mL OMNIPAQUE IOHEXOL 350 MG/ML SOLN COMPARISON:  None. FINDINGS: Osseous: No acute fracture. Orbits: Unremarkable. Sinuses: Minor mucosal thickening.  Mastoid air cells are clear. Soft tissues: Soft tissue swelling with fat infiltration along the body of the right mandible. Suboptimal visualization due to streak artifact. No evidence of abscess. Dental decay is noted including significantly decayed right mandibular  posterior premolar and posterior molars with periapical lucency. Limited intracranial: No abnormal enhancement. Parenchymal volume loss. IMPRESSION: Soft tissue swelling along the body of the mandible on the right. No acute fracture. Significant right mandibular dental decay is noted and may be the source of above. There is no soft tissue abscess. Electronically Signed   By: [name] M.D.   On: 01/07/2021 12:20   DG Chest Port 1 View  Result Date: 01/07/2021 CLINICAL DATA:  Weakness.  Recent falls. EXAM: PORTABLE CHEST 1 VIEW COMPARISON:  None. FINDINGS: Heart size is normal. No pleural effusion or edema. Scarring is again noted within the left lower lobe. No superimposed airspace consolidation. Degenerative changes are noted within both glenohumeral joints. IMPRESSION: 1. No acute cardiopulmonary abnormalities. 2. 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 Electronically Signed   By: Kerby Moors M.D.   On: 01/07/2021 12:13   ECHOCARDIOGRAM COMPLETE  Result Date: 01/08/2021    ECHOCARDIOGRAM REPORT   Patient Name:   ARTEMUS ROMANOFF Date of Exam: 01/08/2021 Medical Rec #:  [id number]    Height:       70.0 in Accession #:    [id number]   Weight:       211.0 lb Date of Birth:  07/21/1940     BSA:          2.135 m Patient Age:    63 years     BP:           103/72 mmHg Patient Gender: M            HR:           63 bpm. Exam Location:  [facility] Procedure: 2D Echo, Color Doppler and Cardiac Doppler Indications:     R94.31 Abnormal ECG  History:         Patient has no prior history of Echocardiogram examinations.                  Risk Factors:Hypertension and Dyslipidemia.  Sonographer:     Charmayne Sheer Referring Phys:  [id number] Lequita Halt Diagnosing Phys: Ida Rogue MD  Sonographer Comments: Suboptimal parasternal window and suboptimal subcostal window. Image acquisition challenging due to patient body habitus. IMPRESSIONS  1. Left ventricular ejection fraction, by estimation, is 60 to 65%. The left ventricle  has normal function. The left ventricle has no regional wall motion abnormalities. There is mild left ventricular hypertrophy. Left ventricular diastolic parameters are indeterminate.  2. Right ventricular systolic function is normal. The right ventricular size is normal.  3. The mitral valve is normal in structure. Mild mitral valve regurgitation. No evidence of mitral stenosis.  4. Rhythm is atrial fibrillation FINDINGS  Left Ventricle: Left ventricular ejection fraction, by estimation, is 60 to 65%. The left ventricle has normal function. The left ventricle has no regional wall motion abnormalities. The left ventricular internal cavity size was normal in size. There is  mild left ventricular hypertrophy. Left ventricular diastolic parameters are indeterminate. Right Ventricle: The right ventricular size is normal. No increase in right ventricular wall thickness. Right ventricular systolic function is normal. Left Atrium: Left atrial size was normal in size. Right Atrium: Right atrial size was normal in size. Pericardium: There is no evidence of pericardial effusion. Mitral Valve: The mitral valve is normal in structure. Mild mitral valve regurgitation. No evidence of mitral valve stenosis. MV peak gradient, 4.9 mmHg. The mean mitral valve gradient is 2.0 mmHg. Tricuspid Valve: The tricuspid valve is normal in structure. Tricuspid valve regurgitation is not demonstrated. No evidence of tricuspid stenosis. Aortic Valve: The aortic valve was not well visualized. Aortic valve regurgitation is not visualized. No aortic stenosis is present. Aortic valve mean gradient measures 3.0 mmHg. Aortic valve peak gradient measures 5.0 mmHg. Aortic valve area, by VTI measures 3.42 cm. Pulmonic Valve: The pulmonic valve was normal in structure. Pulmonic valve regurgitation is trivial. No evidence of pulmonic stenosis. Aorta: The aortic root is normal in size and structure. Venous: The  inferior vena cava is normal in size with  greater than 50% respiratory variability, suggesting right atrial pressure of 3 mmHg. IAS/Shunts: No atrial level shunt detected by color flow Doppler.  LEFT VENTRICLE PLAX 2D LVIDd:         4.60 cm   Diastology LVIDs:         3.60 cm   LV e' medial:    10.90 cm/s LV PW:         0.90 cm   LV E/e' medial:  8.1 LV IVS:        0.80 cm   LV e' lateral:   16.80 cm/s LVOT diam:     2.40 cm   LV E/e' lateral: 5.3 LV SV:         71 LV SV Index:   33 LVOT Area:     4.52 cm  RIGHT VENTRICLE RV Basal diam:  3.60 cm RV S prime:     13.70 cm/s LEFT ATRIUM           Index        RIGHT ATRIUM           Index LA diam:      2.40 cm 1.12 cm/m   RA Area:     14.40 cm LA Vol (A4C): 35.8 ml 16.77 ml/m  RA Volume:   31.40 ml  14.70 ml/m  AORTIC VALVE                    PULMONIC VALVE AV Area (Vmax):    3.66 cm     PV Vmax:       0.78 m/s AV Area (Vmean):   3.57 cm     PV Vmean:      52.200 cm/s AV Area (VTI):     3.42 cm     PV VTI:        0.150 m AV Vmax:           112.00 cm/s  PV Peak grad:  2.4 mmHg AV Vmean:          77.100 cm/s  PV Mean grad:  1.0 mmHg AV VTI:            0.209 m AV Peak Grad:      5.0 mmHg AV Mean Grad:      3.0 mmHg LVOT Vmax:         90.70 cm/s LVOT Vmean:        60.900 cm/s LVOT VTI:          0.158 m LVOT/AV VTI ratio: 0.76  AORTA Ao Root diam: 3.40 cm MITRAL VALVE MV Area (PHT): 2.68 cm    SHUNTS MV Area VTI:   3.90 cm    Systemic VTI:  0.16 m MV Peak grad:  4.9 mmHg    Systemic Diam: 2.40 cm MV Mean grad:  2.0 mmHg MV Vmax:       1.11 m/s MV Vmean:      66.5 cm/s MV Decel Time: 283 msec MV E velocity: 88.80 cm/s Ida Rogue MD Electronically signed by Ida Rogue MD Signature Date/Time: 01/08/2021/5:08:34 PM    Final         Scheduled Meds:  acidophilus  2 capsule Oral TID   docusate sodium  100 mg Oral BID   pantoprazole  40 mg Oral Daily   potassium chloride  40 mEq Oral Once   Continuous Infusions:  sodium chloride 10 mL/hr at 01/09/21 0740   ampicillin-sulbactam (  UNASYN) IV 3 g  (01/09/21 0502)   heparin 1,850 Units/hr (01/08/21 1850)   potassium chloride      Assessment & Plan:   Active Problems:   Sepsis ([place])   A-fib ([place])   Sepsis Likely due to submandibular cellulitis/infection.  Ct without abscess. Has tooth decay, was suppose to f/u with dentist soon. 10/15 WBC improving Clinically slowly improving with case submandibular cellulitis Continue IV Zosyn     New onset of afib with RVR -CHADS2=2 -Reviewed patient's PMH, no Hx of GI bleed, no Hx of stroke and no anemia. Start Heparin drip, check FOBT and repeat H/H in AM. If H/H stable, can go home with Eliquis and f/u with afib clinic. -BP borderline low, can be attributed to sepsis and uncontrolled HR/afib. Will load patient with digoxin 0.125 mg Q6H x 4 doses, and PRN lopressor. If BP improves, can consider start PO beta blocker. -Make K>4.0 and Mg>2, Mg level pending. 10/15-heart rate improving TSH normal Echo normal EF Replete electrolytes Can switch IV heparin to Eliquis Will need to follow-up with cardiology as outpatient     Deconditioning PT recommends SNF   HTN Home home meds.  If BP improves can start beta-blockers for rate control Continue midodrine      GERD Continue PPI  Hypokalemia- Will replace with p.o. and IV Check a.m. labs   DVT prophylaxis: Heparin drip Code Status: Full Family Communication: None at bedside Disposition Plan:  Status is: Inpatient  Remains inpatient appropriate because:Inpatient level of care appropriate due to severity of illness   Dispo: The patient is from: Home              Anticipated d/c is to: SNF              Patient currently is not medically stable to d/c.              Difficult to place patient No            LOS: 2 days   Time spent: 35 minutes with more than 50% on [place], MD [facility] Pager [phone]  If 7PM-7AM, please contact night-coverage 01/09/2021, 8:22 AM

## 2021-01-09 NOTE — Progress Notes (Signed)
Throughout the evening, the pt continued to stay in afib. Pt's heartrate trended around the 90's at times. When resting pt tended to trend down into the 50's with pauses. The longest was 2.70 pause.VS stayed WDL throughout the evening.

## 2021-01-09 NOTE — Consult Note (Signed)
ANTICOAGULATION CONSULT NOTE -   Pharmacy Consult for heparin Indication: atrial fibrillation  No Known Allergies  Patient Measurements: Height: 5\' 10"  (177.8 cm) Weight: 95.7 kg (211 lb) IBW/kg (Calculated) : 73 Heparin Dosing Weight: 92.6 kg  Vital Signs: Temp: 97.7 F (36.5 C) (10/15 1115) Temp Source: Oral (10/15 1115) BP: 121/77 (10/15 1115) Pulse Rate: 76 (10/15 1115)  Labs: Recent Labs    01/07/21 1050 01/07/21 1145 01/07/21 1350 01/07/21 2326 01/08/21 0530 01/08/21 0930 01/08/21 1756 01/09/21 0259 01/09/21 0653 01/09/21 1104  HGB 14.2  --   --   --  11.6*  --   --  11.3*  --   --   HCT 40.2  --   --   --  33.1*  --   --  33.1*  --   --   PLT 208  --   --   --  162  --   --  172  --   --   APTT  --  32  --   --   --   --   --   --   --   --   LABPROT  --  14.1  --   --   --   --   --   --   --   --   INR  --  1.1  --   --   --   --   --   --   --   --   HEPARINUNFRC  --   --   --    < >  --    < > 0.29* 0.42  --  0.61  CREATININE 1.18  --   --   --  0.90  --   --   --  0.85  --   TROPONINIHS 14  --  13  --   --   --   --   --   --   --    < > = values in this interval not displayed.     Estimated Creatinine Clearance: 81.8 mL/min (by C-G formula based on SCr of 0.85 mg/dL).   Medical History: Past Medical History:  Diagnosis Date   Cyst of breast 1981   right   GERD (gastroesophageal reflux disease)    Hepatitis 1988   History of shingles    Hyperlipidemia    Hypertension    Osteoarthritis of right hip    Skin cancer of face     Medications:  Scheduled:   acidophilus  2 capsule Oral TID   docusate sodium  100 mg Oral BID   midodrine  5 mg Oral TID WC   pantoprazole  40 mg Oral Daily    Assessment: 80yo M with PMH of HTN, HLD, and GERD who presented to the ED for weakness but was found to be in Afib w/ RVR. Pharmacy was consulted for heparin dosing.   No anticoagulants were noted on PTA med  rec  Date/time HL Interpretation/comment 10/13 2326 <0.10 Subthera, bolus 2700 units, 1300>1650 units/hr  10/14 1002 0.32 Thera x1 10/14 1756 0.29 Subthera, bolus 1400 units, 1650>1850 units/hr 10/15 1104 0.61 Thera.   Goal of Therapy:  Heparin level 0.3-0.7 units/ml Monitor platelets by anticoagulation protocol: Yes   Plan:  10/15:  HL @1104 =0.61 therapeutic  Will continue pt on current rate and draw confirmation level in 8 hrs -CBC daily  01/09/2021 12:31 PM

## 2021-01-10 LAB — CULTURE, BLOOD (ROUTINE X 2): Special Requests: ADEQUATE

## 2021-01-10 LAB — CBC
HCT: 37.2 % — ABNORMAL LOW (ref 39.0–52.0)
Hemoglobin: 12.7 g/dL — ABNORMAL LOW (ref 13.0–17.0)
MCH: 33 pg (ref 26.0–34.0)
MCHC: 34.1 g/dL (ref 30.0–36.0)
MCV: 96.6 fL (ref 80.0–100.0)
Platelets: 201 10*3/uL (ref 150–400)
RBC: 3.85 MIL/uL — ABNORMAL LOW (ref 4.22–5.81)
RDW: 13.7 % (ref 11.5–15.5)
WBC: 6.4 10*3/uL (ref 4.0–10.5)
nRBC: 0 % (ref 0.0–0.2)

## 2021-01-10 LAB — POTASSIUM: Potassium: 3.9 mmol/L (ref 3.5–5.1)

## 2021-01-10 MED ORDER — METOPROLOL SUCCINATE ER 25 MG PO TB24
12.5000 mg | ORAL_TABLET | Freq: Every day | ORAL | Status: DC
  Administered 2021-01-10 – 2021-01-11 (×2): 12.5 mg via ORAL
  Filled 2021-01-10 (×2): qty 1

## 2021-01-10 NOTE — Progress Notes (Signed)
Progress Note  Patient Name: Jackob Crookston Date of Encounter: 01/10/2021  [facility] Cardiologist: New  Subjective   Remains in rate controlled atrial fibrillation.  Feels much better today than prior days.  Jaw pain has improved.   Inpatient Medications    Scheduled Meds:  acidophilus  2 capsule Oral TID   apixaban  5 mg Oral BID   docusate sodium  100 mg Oral BID   midodrine  5 mg Oral TID WC   pantoprazole  40 mg Oral Daily   Continuous Infusions:  sodium chloride Stopped (01/09/21 1635)   ampicillin-sulbactam (UNASYN) IV Stopped (01/10/21 1054)   lactated ringers 50 mL/hr at 01/10/21 1151   PRN Meds: sodium chloride, acetaminophen **OR** acetaminophen, bisacodyl, HYDROmorphone (DILAUDID) injection, ondansetron **OR** ondansetron (ZOFRAN) IV   Vital Signs    Vitals:   01/09/21 2345 01/10/21 0400 01/10/21 0739 01/10/21 1154  BP: 137/88 127/87 127/82 133/86  Pulse: 73 63 82 73  Resp: 16 18 18 18   Temp: 98.3 F (36.8 C) 98.2 F (36.8 C) 97.7 F (36.5 C) 97.6 F (36.4 C)  TempSrc: Oral Oral    SpO2: 98% 99% 99% 96%  Weight:      Height:        Intake/Output Summary (Last 24 hours) at 01/10/2021 1158 Last data filed at 01/10/2021 1151 Gross per 24 hour  Intake 2835.62 ml  Output 1800 ml  Net 1035.62 ml    Last 3 Weights 01/07/2021 01/01/2019 12/24/2018  Weight (lbs) 211 lb 205 lb 0.4 oz 204 lb  Weight (kg) 95.709 kg 93 kg 92.534 kg      Telemetry    Atrial fibrillation-personally reviewed  ECG    None new  Physical Exam   GEN: Well nourished, well developed, in no acute distress  HEENT: normal  Neck: no JVD, carotid bruits, or masses Cardiac: Irregular; no murmurs, rubs, or gallops,no edema  Respiratory:  clear to auscultation bilaterally, normal work of breathing GI: soft, nontender, nondistended, + BS MS: no deformity or atrophy  Skin: warm and dry Neuro:  Strength and sensation are intact Psych: euthymic mood, full affect   Labs     High Sensitivity Troponin:   Recent Labs  Lab 01/07/21 1050 01/07/21 1350  TROPONINIHS 14 13      Chemistry Recent Labs  Lab 01/07/21 1050 01/07/21 1350 01/08/21 0530 01/09/21 0653 01/10/21 0544  NA 133*  --  135 136  --   K 3.6  --  3.4* 3.1* 3.9  CL 95*  --  104 101  --   CO2 27  --  27 27  --   GLUCOSE 127*  --  102* 94  --   BUN 31*  --  24* 20  --   CREATININE 1.18  --  0.90 0.85  --   CALCIUM 8.7*  --  7.7* 8.0*  --   MG  --  1.7  --  2.1  --   PROT 6.8  --   --   --   --   ALBUMIN 3.4*  --   --   --   --   AST 20  --   --   --   --   ALT 19  --   --   --   --   ALKPHOS 40  --   --   --   --   BILITOT 0.9  --   --   --   --  GFRNONAA >60  --  >60 >60  --   ANIONGAP 11  --  4* 8  --      Lipids No results for input(s): CHOL, TRIG, HDL, LABVLDL, LDLCALC, CHOLHDL in the last 168 hours.  Hematology Recent Labs  Lab 01/08/21 0530 01/09/21 0259 01/10/21 0544  WBC 10.1 7.1 6.4  RBC 3.36* 3.38* 3.85*  HGB 11.6* 11.3* 12.7*  HCT 33.1* 33.1* 37.2*  MCV 98.5 97.9 96.6  MCH 34.5* 33.4 33.0  MCHC 35.0 34.1 34.1  RDW 14.0 13.5 13.7  PLT 162 172 201    Thyroid  Recent Labs  Lab 01/07/21 1050  TSH 0.615     BNPNo results for input(s): BNP, PROBNP in the last 168 hours.  DDimer No results for input(s): DDIMER in the last 168 hours.   Radiology    ECHOCARDIOGRAM COMPLETE  Result Date: 01/08/2021    ECHOCARDIOGRAM REPORT   Patient Name:   DRAYVEN MARCHENA Date of Exam: 01/08/2021 Medical Rec #:  [id number]    Height:       70.0 in Accession #:    [id number]   Weight:       211.0 lb Date of Birth:  1940-08-27     BSA:          2.135 m Patient Age:    88 years     BP:           103/72 mmHg Patient Gender: M            HR:           63 bpm. Exam Location:  [facility] Procedure: 2D Echo, Color Doppler and Cardiac Doppler Indications:     R94.31 Abnormal ECG  History:         Patient has no prior history of Echocardiogram examinations.                  Risk  Factors:Hypertension and Dyslipidemia.  Sonographer:     Charmayne Sheer Referring Phys:  [id number] Lequita Halt Diagnosing Phys: Ida Rogue MD  Sonographer Comments: Suboptimal parasternal window and suboptimal subcostal window. Image acquisition challenging due to patient body habitus. IMPRESSIONS  1. Left ventricular ejection fraction, by estimation, is 60 to 65%. The left ventricle has normal function. The left ventricle has no regional wall motion abnormalities. There is mild left ventricular hypertrophy. Left ventricular diastolic parameters are indeterminate.  2. Right ventricular systolic function is normal. The right ventricular size is normal.  3. The mitral valve is normal in structure. Mild mitral valve regurgitation. No evidence of mitral stenosis.  4. Rhythm is atrial fibrillation FINDINGS  Left Ventricle: Left ventricular ejection fraction, by estimation, is 60 to 65%. The left ventricle has normal function. The left ventricle has no regional wall motion abnormalities. The left ventricular internal cavity size was normal in size. There is  mild left ventricular hypertrophy. Left ventricular diastolic parameters are indeterminate. Right Ventricle: The right ventricular size is normal. No increase in right ventricular wall thickness. Right ventricular systolic function is normal. Left Atrium: Left atrial size was normal in size. Right Atrium: Right atrial size was normal in size. Pericardium: There is no evidence of pericardial effusion. Mitral Valve: The mitral valve is normal in structure. Mild mitral valve regurgitation. No evidence of mitral valve stenosis. MV peak gradient, 4.9 mmHg. The mean mitral valve gradient is 2.0 mmHg. Tricuspid Valve: The tricuspid valve is normal in structure. Tricuspid valve regurgitation is not demonstrated. No evidence of tricuspid  stenosis. Aortic Valve: The aortic valve was not well visualized. Aortic valve regurgitation is not visualized. No aortic stenosis is present.  Aortic valve mean gradient measures 3.0 mmHg. Aortic valve peak gradient measures 5.0 mmHg. Aortic valve area, by VTI measures 3.42 cm. Pulmonic Valve: The pulmonic valve was normal in structure. Pulmonic valve regurgitation is trivial. No evidence of pulmonic stenosis. Aorta: The aortic root is normal in size and structure. Venous: The inferior vena cava is normal in size with greater than 50% respiratory variability, suggesting right atrial pressure of 3 mmHg. IAS/Shunts: No atrial level shunt detected by color flow Doppler.  LEFT VENTRICLE PLAX 2D LVIDd:         4.60 cm   Diastology LVIDs:         3.60 cm   LV e' medial:    10.90 cm/s LV PW:         0.90 cm   LV E/e' medial:  8.1 LV IVS:        0.80 cm   LV e' lateral:   16.80 cm/s LVOT diam:     2.40 cm   LV E/e' lateral: 5.3 LV SV:         71 LV SV Index:   33 LVOT Area:     4.52 cm  RIGHT VENTRICLE RV Basal diam:  3.60 cm RV S prime:     13.70 cm/s LEFT ATRIUM           Index        RIGHT ATRIUM           Index LA diam:      2.40 cm 1.12 cm/m   RA Area:     14.40 cm LA Vol (A4C): 35.8 ml 16.77 ml/m  RA Volume:   31.40 ml  14.70 ml/m  AORTIC VALVE                    PULMONIC VALVE AV Area (Vmax):    3.66 cm     PV Vmax:       0.78 m/s AV Area (Vmean):   3.57 cm     PV Vmean:      52.200 cm/s AV Area (VTI):     3.42 cm     PV VTI:        0.150 m AV Vmax:           112.00 cm/s  PV Peak grad:  2.4 mmHg AV Vmean:          77.100 cm/s  PV Mean grad:  1.0 mmHg AV VTI:            0.209 m AV Peak Grad:      5.0 mmHg AV Mean Grad:      3.0 mmHg LVOT Vmax:         90.70 cm/s LVOT Vmean:        60.900 cm/s LVOT VTI:          0.158 m LVOT/AV VTI ratio: 0.76  AORTA Ao Root diam: 3.40 cm MITRAL VALVE MV Area (PHT): 2.68 cm    SHUNTS MV Area VTI:   3.90 cm    Systemic VTI:  0.16 m MV Peak grad:  4.9 mmHg    Systemic Diam: 2.40 cm MV Mean grad:  2.0 mmHg MV Vmax:       1.11 m/s MV Vmean:      66.5 cm/s MV Decel Time: 283 msec MV E velocity: 88.80 cm/s Christia Reading  Rockey Situ  MD Electronically signed by Ida Rogue MD Signature Date/Time: 01/08/2021/5:08:34 PM    Final     Cardiac Studies   Echo 01/09/21  1. Left ventricular ejection fraction, by estimation, is 60 to 65%. The  left ventricle has normal function. The left ventricle has no regional  wall motion abnormalities. There is mild left ventricular hypertrophy.  Left ventricular diastolic parameters  are indeterminate.   2. Right ventricular systolic function is normal. The right ventricular  size is normal.   3. The mitral valve is normal in structure. Mild mitral valve  regurgitation. No evidence of mitral stenosis.   4. Rhythm is atrial fibrillation    Patient Profile     80 y.o. male of HTN, HLD, GERD, OA of knees who is being seen 01/08/2021 for the evaluation of new onset afib.  Assessment & Plan    1.  Sepsis: Presented with jaw and tooth pain associated with fever, tachycardia, leukocytosis.  Currently on antibiotics per primary team.  2.  Atrial fibrillation: Diagnosed this admission.  Had rapid ventricular rates, though this has been well controlled.  Has been switched to Eliquis.  He Khalon Cansler need follow-up in cardiology clinic.  Rate is well controlled.  3.  Hypertension: Patient currently on midodrine as blood pressures had previously been low.  This can be further adjusted by primary team.   For questions or updates, please contact [place] Please consult [email or web address] for contact info under        Signed, Leala Bryand Meredith Leeds, MD  01/10/2021, 11:58 AM

## 2021-01-10 NOTE — Progress Notes (Signed)
Physical Therapy Treatment Patient Details Name: Steven Welch MRN: [id number] DOB: 04-Dec-1940 Today's Date: 01/10/2021   History of Present Illness Pt is a 80 y.o. male with past medical history of hypertension, hyperlipidemia, and GERD who presents to the ED complaining of weakness and R jaw swelling. MD assessment includes sepsis, weakness, and Afib.    PT Comments    Pt was pleasant and motivated to participate during the session and put forth good effort throughout.  Pt required Min A and extra effort with bed mobility and transfers from various surfaces and was able to take several small steps x 2 with mod lean on the RW for support.  Pt's SpO2 and HR were both WNL during the session.  Of note, pt had one episode of diarrhea during the session with nsg aware and in room to assist with hygiene. Pt will benefit from PT services in a SNF setting upon discharge to safely address deficits listed in patient problem list for decreased caregiver assistance and eventual return to PLOF.      Recommendations for follow up therapy are one component of a multi-disciplinary discharge planning process, led by the attending physician.  Recommendations may be updated based on patient status, additional functional criteria and insurance authorization.  Follow Up Recommendations  SNF     Equipment Recommendations  Rolling walker with 5" wheels    Recommendations for Other Services       Precautions / Restrictions Precautions Precautions: Fall Restrictions Weight Bearing Restrictions: No     Mobility  Bed Mobility Overal bed mobility: Needs Assistance Bed Mobility: Supine to Sit     Supine to sit: Min assist;HOB elevated     General bed mobility comments: increased time, effort, use of bed rail, and min A to come to full upright sitting position    Transfers Overall transfer level: Needs assistance Equipment used: Rolling walker (2 wheeled) Transfers: Sit to/from Stand Sit to Stand:  Min assist;From elevated surface         General transfer comment: Extra time and effort to come to full upright position from an elevate surface  Ambulation/Gait Ambulation/Gait assistance: Min guard Gait Distance (Feet): 2 Feet x 2 Assistive device: Rolling walker (2 wheeled) Gait Pattern/deviations: Step-to pattern;Decreased step length - right;Decreased step length - left;Trunk flexed Gait velocity: decreased   General Gait Details: Pt able to take several small, effortful steps from the EOB to [facility] and from [facility] to recliner with mod lean on the RW for support   Stairs             Wheelchair Mobility    Modified Rankin (Stroke Patients Only)       Balance Overall balance assessment: Needs assistance Sitting-balance support: Single extremity supported;Feet supported Sitting balance-Leahy Scale: Good     Standing balance support: Bilateral upper extremity supported;During functional activity Standing balance-Leahy Scale: Fair                              Cognition Arousal/Alertness: Awake/alert Behavior During Therapy: WFL for tasks assessed/performed Overall Cognitive Status: Within Functional Limits for tasks assessed                                        Exercises Total Joint Exercises Ankle Circles/Pumps: AROM;Strengthening;Both;5 reps;10 reps Quad Sets: Strengthening;Both;5 reps;10 reps Gluteal Sets: 10 reps;5 reps;Both;Strengthening Long Arc  Quad: AROM;Strengthening;Both;10 reps Knee Flexion: 10 reps;Both;Strengthening;AROM Other Exercises Other Exercises: HEP education for BLE APs, QS, and GS x 10 each every 1-2 hours daily    General Comments        Pertinent Vitals/Pain Pain Assessment: No/denies pain    Home Living Family/patient expects to be discharged to:: Private residence                    Prior Function            PT Goals (current goals can now be found in the care plan section) Progress  towards PT goals: Progressing toward goals    Frequency    Min 2X/week      PT Plan Current plan remains appropriate    Co-evaluation              AM-PAC PT "6 Clicks" Mobility   Outcome Measure  Help needed turning from your back to your side while in a flat bed without using bedrails?: A Little Help needed moving from lying on your back to sitting on the side of a flat bed without using bedrails?: A Little Help needed moving to and from a bed to a chair (including a wheelchair)?: A Little Help needed standing up from a chair using your arms (e.g., wheelchair or bedside chair)?: A Little Help needed to walk in hospital room?: A Lot Help needed climbing 3-5 steps with a railing? : Total 6 Click Score: 15    End of Session Equipment Utilized During Treatment: Gait belt Activity Tolerance: Patient tolerated treatment well Patient left: with call bell/phone within reach;in chair;with chair alarm set Nurse Communication: Mobility status PT Visit Diagnosis: Unsteadiness on feet (R26.81);Repeated falls (R29.6);Muscle weakness (generalized) (M62.81);History of falling (Z91.81)     Time: 1350-1430 PT Time Calculation (min) (ACUTE ONLY): 40 min  Charges:  $Therapeutic Exercise: 8-22 mins $Therapeutic Activity: 23-37 mins                     D. Scott Carmin Dibartolo PT, DPT 01/10/21, 4:59 PM

## 2021-01-10 NOTE — TOC Progression Note (Signed)
Transition of Care [facility]) - Progression Note    Patient Details  Name: Steven Welch MRN: [id number] Date of Birth: 1940-04-21  Transition of Care [facility]) CM/SW Contact  Eileen Stanford, LCSW Phone Number: 01/10/2021, 12:58 PM  Clinical Narrative:   No bed offers at this time. CSW extended bed search to [facility].    Expected Discharge Plan: [place] Barriers to Discharge: Continued Medical Work up  Expected Discharge Plan and Services Expected Discharge Plan: [place] Choice: [place], [place] Living arrangements for the past 2 months: Single Family Home                                       Social Determinants of Health (SDOH) Interventions    Readmission Risk Interventions No flowsheet data found.

## 2021-01-10 NOTE — Progress Notes (Addendum)
PROGRESS NOTE    Steven Welch  ZHY:[id number] DOB: Aug 29, 1940 DOA: 01/07/2021 PCP: Juluis Pitch, MD    Brief Narrative:  Steven Welch is a 80 y.o. male with medical history significant of HTN, HLD, GERD, OA of knees, came with increasing right facial swelling and pain and generalized weakness.   Symptoms started one week ago, with a increasing right mandibular tooth ache, then left jaw pain and swelling, no problem swelling normal food and no problem breathing. Episodes of chills but no fever, no chest pain or palpitations. Last 2-3 days, right jaw pain and swelling became worse, with increasing generalized weakness. Still no fever at home.   ED Course: Low fever 6f 100.5 F, afib with RVR, BP borderline low. WBC 15.2. CT of face showed right mandible soft tissue swelling and dental dental decay, no abscess seen. Lactic acid=1.6.   10/14 cards consulted this am. He does report to me this past week he has only been getting 2 hrs of sleep each night and has been tired. He did fall getting out of bed, but reports likely due to lack of sleep . Denies any sx prior to fall such as dizziness, palpitations, lightheadedness.   10/15 feels like his swelling has improved mildly.  Now is able to his right face since swelling Telemetry heart rate 40s to 50s. Discussed with Dr. Curt Bears this am. Pt asx.and it was at rest  10/16 1/2 set bcx with staph hominis.     Consultants:  Cardiology  Procedures:  CT head: No acute intracranial hemorrhage or evidence of acute infarction.   Moderate chronic microvascular ischemic changes.   Parenchymal volume loss with disproportionate temporal atrophy.   Relative prominence of ventricles favored to be due to central volume loss. A component of communicating/normal pressure hydrocephalus is possible in the appropriate setting     CT maxofac: Soft tissue swelling along the body of the mandible on the right. No acute fracture.   Significant right  mandibular dental decay is noted and may be the source of above. There is no soft tissue abscess.    Antimicrobials:  unasyn   Subjective: Rt face swelling decrease, no sob, no cp. Able to open her mouth more.   Objective: Vitals:   01/09/21 1955 01/09/21 2345 01/10/21 0400 01/10/21 0739  BP: 131/78 137/88 127/87 127/82  Pulse: 67 73 63 82  Resp: 16 16 18 18   Temp: 97.7 F (36.5 C) 98.3 F (36.8 C) 98.2 F (36.8 C) 97.7 F (36.5 C)  TempSrc: Oral Oral Oral   SpO2: 99% 98% 99% 99%  Weight:      Height:        Intake/Output Summary (Last 24 hours) at 01/10/2021 0843 Last data filed at 01/10/2021 0650 Gross per 24 hour  Intake 3053.92 ml  Output 2550 ml  Net 503.92 ml   Filed Weights   01/07/21 1047  Weight: 95.7 kg    Examination: Nad, calm Rt face and submandibular swelling redness are down.  Cta no w/r Irreg, s1/s2 no gallop Soft benign +bs No edema [id number]   Data Reviewed: I have personally reviewed following labs and imaging studies  CBC: Recent Labs  Lab 01/07/21 1050 01/08/21 0530 01/09/21 0259 01/10/21 0544  WBC 15.2* 10.1 7.1 6.4  NEUTROABS 12.8*  --   --   --   HGB 14.2 11.6* 11.3* 12.7*  HCT 40.2 33.1* 33.1* 37.2*  MCV 96.6 98.5 97.9 96.6  PLT 208 162 172 295   Basic Metabolic Panel:  Recent Labs  Lab 01/07/21 1050 01/07/21 1350 01/08/21 0530 01/09/21 0653 01/10/21 0544  NA 133*  --  135 136  --   K 3.6  --  3.4* 3.1* 3.9  CL 95*  --  104 101  --   CO2 27  --  27 27  --   GLUCOSE 127*  --  102* 94  --   BUN 31*  --  24* 20  --   CREATININE 1.18  --  0.90 0.85  --   CALCIUM 8.7*  --  7.7* 8.0*  --   MG  --  1.7  --  2.1  --   PHOS  --  2.3*  --  3.0  --    GFR: Estimated Creatinine Clearance: 81.8 mL/min (by C-G formula based on SCr of 0.85 mg/dL). Liver Function Tests: Recent Labs  Lab 01/07/21 1050  AST 20  ALT 19  ALKPHOS 40  BILITOT 0.9  PROT 6.8  ALBUMIN 3.4*   No results for input(s): LIPASE, AMYLASE in the  last 168 hours. No results for input(s): AMMONIA in the last 168 hours. Coagulation Profile: Recent Labs  Lab 01/07/21 1145  INR 1.1   Cardiac Enzymes: No results for input(s): CKTOTAL, CKMB, CKMBINDEX, TROPONINI in the last 168 hours. BNP (last 3 results) No results for input(s): PROBNP in the last 8760 hours. HbA1C: No results for input(s): HGBA1C in the last 72 hours. CBG: No results for input(s): GLUCAP in the last 168 hours. Lipid Profile: No results for input(s): CHOL, HDL, LDLCALC, TRIG, CHOLHDL, LDLDIRECT in the last 72 hours. Thyroid Function Tests: Recent Labs    01/07/21 1050  TSH 0.615   Anemia Panel: Recent Labs    01/07/21 1050  TIBC 238*  IRON 10*   Sepsis Labs: Recent Labs  Lab 01/07/21 1145  LATICACIDVEN 1.6    Recent Results (from the past 240 hour(s))  Blood Culture (routine x 2)     Status: Abnormal (Preliminary result)   Collection Time: 01/07/21 11:03 AM   Specimen: BLOOD  Result Value Ref Range Status   Specimen Description   Final    BLOOD LEFT AF Performed at [facility], [address] [place], [place] [address]    Special Requests   Final    BOTTLES DRAWN AEROBIC AND ANAEROBIC Blood Culture adequate volume Performed at [facility], [address] [place], [place] [address]    Culture  Setup Time   Final    GRAM POSITIVE COCCI ANAEROBIC BOTTLE ONLY CRITICAL RESULT CALLED TO, READ BACK BY AND VERIFIED WITH: JASON ROBBINS 01/08/21 0526 SLM    Culture (A)  Final    STAPHYLOCOCCUS HOMINIS THE SIGNIFICANCE OF ISOLATING THIS ORGANISM FROM A SINGLE SET OF BLOOD CULTURES WHEN MULTIPLE SETS ARE DRAWN IS UNCERTAIN. PLEASE NOTIFY THE MICROBIOLOGY DEPARTMENT WITHIN ONE WEEK IF SPECIATION AND SENSITIVITIES ARE REQUIRED. Performed at [facility], [place] [address] [place],  [address]    Report Status PENDING  Incomplete  Blood Culture ID Panel (Reflexed)     Status: Abnormal   Collection Time: 01/07/21  11:03 AM  Result Value Ref Range Status   Enterococcus faecalis NOT DETECTED NOT DETECTED Final   Enterococcus Faecium NOT DETECTED NOT DETECTED Final   Listeria monocytogenes NOT DETECTED NOT DETECTED Final   Staphylococcus species DETECTED (A) NOT DETECTED Final    Comment: CRITICAL RESULT CALLED TO, READ BACK BY AND VERIFIED WITH: JASON ROBBINS 01/08/21 0526 SLM  Staphylococcus aureus (BCID) NOT DETECTED NOT DETECTED Final   Staphylococcus epidermidis NOT DETECTED NOT DETECTED Final   Staphylococcus lugdunensis NOT DETECTED NOT DETECTED Final   Streptococcus species NOT DETECTED NOT DETECTED Final   Streptococcus agalactiae NOT DETECTED NOT DETECTED Final   Streptococcus pneumoniae NOT DETECTED NOT DETECTED Final   Streptococcus pyogenes NOT DETECTED NOT DETECTED Final   A.calcoaceticus-baumannii NOT DETECTED NOT DETECTED Final   Bacteroides fragilis NOT DETECTED NOT DETECTED Final   Enterobacterales NOT DETECTED NOT DETECTED Final   Enterobacter cloacae complex NOT DETECTED NOT DETECTED Final   Escherichia coli NOT DETECTED NOT DETECTED Final   Klebsiella aerogenes NOT DETECTED NOT DETECTED Final   Klebsiella oxytoca NOT DETECTED NOT DETECTED Final   Klebsiella pneumoniae NOT DETECTED NOT DETECTED Final   Proteus species NOT DETECTED NOT DETECTED Final   Salmonella species NOT DETECTED NOT DETECTED Final   Serratia marcescens NOT DETECTED NOT DETECTED Final   Haemophilus influenzae NOT DETECTED NOT DETECTED Final   Neisseria meningitidis NOT DETECTED NOT DETECTED Final   Pseudomonas aeruginosa NOT DETECTED NOT DETECTED Final   Stenotrophomonas maltophilia NOT DETECTED NOT DETECTED Final   Candida albicans NOT DETECTED NOT DETECTED Final   Candida auris NOT DETECTED NOT DETECTED Final   Candida glabrata NOT DETECTED NOT DETECTED Final   Candida krusei NOT DETECTED NOT DETECTED Final   Candida parapsilosis NOT DETECTED NOT DETECTED Final   Candida tropicalis NOT DETECTED NOT  DETECTED Final   Cryptococcus neoformans/gattii NOT DETECTED NOT DETECTED Final    Comment: Performed at [facility], [address] [place], [place] [address]  Resp Panel by RT-PCR (Flu A&B, Covid) Nasopharyngeal Swab     Status: None   Collection Time: 01/07/21 11:45 AM   Specimen: Nasopharyngeal Swab; Nasopharyngeal(NP) swabs in vial transport medium  Result Value Ref Range Status   SARS Coronavirus 2 by RT PCR NEGATIVE NEGATIVE Final    Comment: (NOTE) SARS-CoV-2 target nucleic acids are NOT DETECTED.  The SARS-CoV-2 RNA is generally detectable in upper respiratory specimens during the acute phase of infection. The lowest concentration of SARS-CoV-2 viral copies this assay can detect is 138 copies/mL. A negative result does not preclude SARS-Cov-2 infection and should not be used as the sole basis for treatment or other patient management decisions. A negative result may occur with  improper specimen collection/handling, submission of specimen other than nasopharyngeal swab, presence of viral mutation(s) within the areas targeted by this assay, and inadequate number of viral copies(<138 copies/mL). A negative result must be combined with clinical observations, patient history, and epidemiological information. The expected result is Negative.  Fact Sheet for Patients:  EntrepreneurPulse.com.au  Fact Sheet for Healthcare Providers:  IncredibleEmployment.be  This test is no t yet approved or cleared by the [place] FDA and  has been authorized for detection and/or diagnosis of SARS-CoV-2 by FDA under an Emergency Use Authorization (EUA). This EUA will remain  in effect (meaning this test can be used) for the duration of the COVID-19 declaration under Section 564(b)(1) of the Act, 21 U.S.C.section 360bbb-3(b)(1), unless the authorization is terminated  or revoked sooner.       Influenza A by PCR NEGATIVE NEGATIVE Final    Influenza B by PCR NEGATIVE NEGATIVE Final    Comment: (NOTE) The Xpert Xpress SARS-CoV-2/FLU/RSV plus assay is intended as an aid in the diagnosis of influenza from Nasopharyngeal swab specimens and should not be used as a sole basis for treatment. Nasal washings and aspirates are unacceptable  for Xpert Xpress SARS-CoV-2/FLU/RSV testing.  Fact Sheet for Patients: EntrepreneurPulse.com.au  Fact Sheet for Healthcare Providers: IncredibleEmployment.be  This test is not yet approved or cleared by the [place] FDA and has been authorized for detection and/or diagnosis of SARS-CoV-2 by FDA under an Emergency Use Authorization (EUA). This EUA will remain in effect (meaning this test can be used) for the duration of the COVID-19 declaration under Section 564(b)(1) of the Act, 21 U.S.C. section 360bbb-3(b)(1), unless the authorization is terminated or revoked.  Performed at [facility], [address] [place], [place] [address]   Blood Culture (routine x 2)     Status: None (Preliminary result)   Collection Time: 01/07/21 11:45 AM   Specimen: BLOOD  Result Value Ref Range Status   Specimen Description BLOOD BLOOD RIGHT FOREARM  Final   Special Requests   Final    BOTTLES DRAWN AEROBIC AND ANAEROBIC Blood Culture results may not be optimal due to an inadequate volume of blood received in culture bottles   Culture   Final    NO GROWTH 3 DAYS Performed at [facility], [address], [place], [place] [address]    Report Status PENDING  Incomplete  Urine Culture     Status: None   Collection Time: 01/07/21 11:45 AM   Specimen: In/Out Cath Urine  Result Value Ref Range Status   Specimen Description   Final    IN/OUT CATH URINE Performed at [facility], [address] [place], [place] [address]    Special Requests   Final    NONE Performed at [facility], [address], [place], [place] [address]     Culture   Final    NO GROWTH Performed at [facility], [place] [address], [place], [place] [address]    Report Status 01/08/2021 FINAL  Final         Radiology Studies: ECHOCARDIOGRAM COMPLETE  Result Date: 01/08/2021    ECHOCARDIOGRAM REPORT   Patient Name:   TIERNAN SUTO Date of Exam: 01/08/2021 Medical Rec #:  [id number]    Height:       70.0 in Accession #:    [id number]   Weight:       211.0 lb Date of Birth:  [birth year]     BSA:          2.135 m Patient Age:    83 years     BP:           103/72 mmHg Patient Gender: M            HR:           63 bpm. Exam Location:  [facility] Procedure: 2D Echo, Color Doppler and Cardiac Doppler Indications:     R94.31 Abnormal ECG  History:         Patient has no prior history of Echocardiogram examinations.                  Risk Factors:Hypertension and Dyslipidemia.  Sonographer:     Charmayne Sheer Referring Phys:  [id number] Lequita Halt Diagnosing Phys: Ida Rogue MD  Sonographer Comments: Suboptimal parasternal window and suboptimal subcostal window. Image acquisition challenging due to patient body habitus. IMPRESSIONS  1. Left ventricular ejection fraction, by estimation, is 60 to 65%. The left ventricle has normal function. The left ventricle has no regional wall motion abnormalities. There is mild left ventricular hypertrophy. Left ventricular diastolic parameters are indeterminate.  2. Right ventricular systolic  function is normal. The right ventricular size is normal.  3. The mitral valve is normal in structure. Mild mitral valve regurgitation. No evidence of mitral stenosis.  4. Rhythm is atrial fibrillation FINDINGS  Left Ventricle: Left ventricular ejection fraction, by estimation, is 60 to 65%. The left ventricle has normal function. The left ventricle has no regional wall motion abnormalities. The left ventricular internal cavity size was normal in size. There is  mild left ventricular hypertrophy. Left ventricular diastolic parameters are  indeterminate. Right Ventricle: The right ventricular size is normal. No increase in right ventricular wall thickness. Right ventricular systolic function is normal. Left Atrium: Left atrial size was normal in size. Right Atrium: Right atrial size was normal in size. Pericardium: There is no evidence of pericardial effusion. Mitral Valve: The mitral valve is normal in structure. Mild mitral valve regurgitation. No evidence of mitral valve stenosis. MV peak gradient, 4.9 mmHg. The mean mitral valve gradient is 2.0 mmHg. Tricuspid Valve: The tricuspid valve is normal in structure. Tricuspid valve regurgitation is not demonstrated. No evidence of tricuspid stenosis. Aortic Valve: The aortic valve was not well visualized. Aortic valve regurgitation is not visualized. No aortic stenosis is present. Aortic valve mean gradient measures 3.0 mmHg. Aortic valve peak gradient measures 5.0 mmHg. Aortic valve area, by VTI measures 3.42 cm. Pulmonic Valve: The pulmonic valve was normal in structure. Pulmonic valve regurgitation is trivial. No evidence of pulmonic stenosis. Aorta: The aortic root is normal in size and structure. Venous: The inferior vena cava is normal in size with greater than 50% respiratory variability, suggesting right atrial pressure of 3 mmHg. IAS/Shunts: No atrial level shunt detected by color flow Doppler.  LEFT VENTRICLE PLAX 2D LVIDd:         4.60 cm   Diastology LVIDs:         3.60 cm   LV e' medial:    10.90 cm/s LV PW:         0.90 cm   LV E/e' medial:  8.1 LV IVS:        0.80 cm   LV e' lateral:   16.80 cm/s LVOT diam:     2.40 cm   LV E/e' lateral: 5.3 LV SV:         71 LV SV Index:   33 LVOT Area:     4.52 cm  RIGHT VENTRICLE RV Basal diam:  3.60 cm RV S prime:     13.70 cm/s LEFT ATRIUM           Index        RIGHT ATRIUM           Index LA diam:      2.40 cm 1.12 cm/m   RA Area:     14.40 cm LA Vol (A4C): 35.8 ml 16.77 ml/m  RA Volume:   31.40 ml  14.70 ml/m  AORTIC VALVE                     PULMONIC VALVE AV Area (Vmax):    3.66 cm     PV Vmax:       0.78 m/s AV Area (Vmean):   3.57 cm     PV Vmean:      52.200 cm/s AV Area (VTI):     3.42 cm     PV VTI:        0.150 m AV Vmax:           112.00 cm/s  PV Peak grad:  2.4 mmHg AV Vmean:          77.100 cm/s  PV Mean grad:  1.0 mmHg AV VTI:            0.209 m AV Peak Grad:      5.0 mmHg AV Mean Grad:      3.0 mmHg LVOT Vmax:         90.70 cm/s LVOT Vmean:        60.900 cm/s LVOT VTI:          0.158 m LVOT/AV VTI ratio: 0.76  AORTA Ao Root diam: 3.40 cm MITRAL VALVE MV Area (PHT): 2.68 cm    SHUNTS MV Area VTI:   3.90 cm    Systemic VTI:  0.16 m MV Peak grad:  4.9 mmHg    Systemic Diam: 2.40 cm MV Mean grad:  2.0 mmHg MV Vmax:       1.11 m/s MV Vmean:      66.5 cm/s MV Decel Time: 283 msec MV E velocity: 88.80 cm/s Ida Rogue MD Electronically signed by Ida Rogue MD Signature Date/Time: 01/08/2021/5:08:34 PM    Final         Scheduled Meds:  acidophilus  2 capsule Oral TID   apixaban  5 mg Oral BID   docusate sodium  100 mg Oral BID   midodrine  5 mg Oral TID WC   pantoprazole  40 mg Oral Daily   Continuous Infusions:  sodium chloride Stopped (01/09/21 1635)   ampicillin-sulbactam (UNASYN) IV Stopped (01/10/21 0226)   lactated ringers 50 mL/hr at 01/10/21 0613    Assessment & Plan:   Active Problems:   Sepsis ([place])   A-fib ([place])   Sepsis Likely due to submandibular cellulitis/infection.  Ct without abscess. Has tooth decay, was suppose to f/u with dentist soon. 10/16 leukocytosis improved Clinically improving Continue IV Unasyn 1 out of 2 sets of blood cultures with staph hominis.  We will repeat blood cultures today      New onset of afib with RVR -CHADS2=2 -Reviewed patient's PMH, no Hx of GI bleed, no Hx of stroke and no anemia. Start Heparin drip, check FOBT and repeat H/H in AM. If H/H stable, can go home with Eliquis and f/u with afib clinic. -BP borderline low, can be attributed to sepsis and  uncontrolled HR/afib. Will load patient with digoxin 0.125 mg Q6H x 4 doses, and PRN lopressor. If BP improves, can consider  TSH normal Echo normal EF Replete electrolytes 10/16 Heparin was switched to Eliquis Will start low-dose Toprol-XL Will need to follow-up with cardiology as outpatient    Deconditioning PT rec. SNF.    HTN Holding home meds.  Bp better with midodrine. But will start low dose beta blk for afib rate control.       GERD Continue PPI  Hypokalemia- Replaced, K is stable now   DVT prophylaxis: Eliquis Code Status: Full Family Communication: None at bedside Disposition Plan:  Status is: Inpatient  Remains inpatient appropriate because:Inpatient level of care appropriate due to severity of illness   Dispo: The patient is from: Home              Anticipated d/c is to: SNF              Patient currently is not medically stable to d/c.              Difficult to place patient No  LOS: 3 days   Time spent: 35 minutes with more than 50% on [place], MD [facility] Pager [phone]  If 7PM-7AM, please contact night-coverage 01/10/2021, 8:43 AM

## 2021-01-11 DIAGNOSIS — L0390 Cellulitis, unspecified: Secondary | ICD-10-CM

## 2021-01-11 MED ORDER — PANTOPRAZOLE SODIUM 40 MG PO TBEC: 40.0000 mg | DELAYED_RELEASE_TABLET | Freq: Every day | ORAL | 0 refills | Status: DC

## 2021-01-11 MED ORDER — APIXABAN 5 MG PO TABS: 5.0000 mg | ORAL_TABLET | Freq: Two times a day (BID) | ORAL | 0 refills | Status: DC

## 2021-01-11 MED ORDER — AMOXICILLIN-POT CLAVULANATE 875-125 MG PO TABS: 1.0000 | ORAL_TABLET | Freq: Two times a day (BID) | ORAL | 0 refills | Status: AC

## 2021-01-11 MED ORDER — MIDODRINE HCL 5 MG PO TABS: 5.0000 mg | ORAL_TABLET | Freq: Three times a day (TID) | ORAL | 0 refills | Status: AC

## 2021-01-11 MED ORDER — METOPROLOL SUCCINATE ER 25 MG PO TB24: 12.5000 mg | ORAL_TABLET | Freq: Every day | ORAL | 0 refills | Status: DC

## 2021-01-11 NOTE — TOC Progression Note (Signed)
Transition of Care [facility]) - Progression Note    Patient Details  Name: Steven Welch MRN: [id number] Date of Birth: [birth year]  Transition of Care [facility]) CM/SW Contact  Eileen Stanford, LCSW Phone Number: 01/11/2021, 10:22 AM  Clinical Narrative:   Pt now declining SNF and would like to dc home. CSW will send out referral to [facility] agencies to see if anyone will service--barrier is insurance.    Expected Discharge Plan: [place] Barriers to Discharge: Continued Medical Work up  Expected Discharge Plan and Services Expected Discharge Plan: [place] Choice: [place], [place] Living arrangements for the past 2 months: Single Family Home                                       Social Determinants of Health (SDOH) Interventions    Readmission Risk Interventions No flowsheet data found.

## 2021-01-11 NOTE — Discharge Summary (Signed)
right mandibular posterior premolar and posterior molars with periapical lucency. Limited intracranial: No abnormal enhancement. Parenchymal volume loss. IMPRESSION: Soft tissue swelling along the body of the mandible on the right. No acute fracture. Significant right mandibular dental decay is noted and may be the source of above. There is no soft tissue abscess. Electronically Signed   By: Steven Welch M.D.   On: 01/07/2021 12:20   DG Chest Port 1 View  Result Date: 01/07/2021 CLINICAL DATA:  Weakness.  Recent falls. EXAM: PORTABLE CHEST 1 VIEW COMPARISON:  None. FINDINGS:  Heart size is normal. No pleural effusion or edema. Scarring is again noted within the left lower lobe. No superimposed airspace consolidation. Degenerative changes are noted within both glenohumeral joints. IMPRESSION: 1. No acute cardiopulmonary abnormalities. 2. 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 Electronically Signed   By: Steven Welch M.D.   On: 01/07/2021 12:13   ECHOCARDIOGRAM COMPLETE  Result Date: 01/08/2021    ECHOCARDIOGRAM REPORT   Patient Name:   Steven Welch Date of Exam: 01/08/2021 Medical Rec #:  [id number]    Height:       70.0 in Accession #:    [id number]   Weight:       211.0 lb Date of Birth:  [birth year]     BSA:          2.135 m Patient Age:    80 years     BP:           103/72 mmHg Patient Gender: M            HR:           63 bpm. Exam Location:  [facility] Procedure: 2D Echo, Color Doppler and Cardiac Doppler Indications:     R94.31 Abnormal ECG  History:         Patient has no prior history of Echocardiogram examinations.                  Risk Factors:Hypertension and Dyslipidemia.  Sonographer:     Charmayne Sheer Referring Phys:  [id number] Steven Welch Diagnosing Phys: Steven Rogue MD  Sonographer Comments: Suboptimal parasternal window and suboptimal subcostal window. Image acquisition challenging due to patient body habitus. IMPRESSIONS  1. Left ventricular ejection fraction, by estimation, is 60 to 65%. The left ventricle has normal function. The left ventricle has no regional wall motion abnormalities. There is mild left ventricular hypertrophy. Left ventricular diastolic parameters are indeterminate.  2. Right ventricular systolic function is normal. The right ventricular size is normal.  3. The mitral valve is normal in structure. Mild mitral valve regurgitation. No evidence of mitral stenosis.  4. Rhythm is atrial fibrillation FINDINGS  Left Ventricle: Left ventricular ejection fraction, by estimation, is 60 to 65%. The left ventricle has normal function. The left ventricle has no  regional wall motion abnormalities. The left ventricular internal cavity size was normal in size. There is  mild left ventricular hypertrophy. Left ventricular diastolic parameters are indeterminate. Right Ventricle: The right ventricular size is normal. No increase in right ventricular wall thickness. Right ventricular systolic function is normal. Left Atrium: Left atrial size was normal in size. Right Atrium: Right atrial size was normal in size. Pericardium: There is no evidence of pericardial effusion. Mitral Valve: The mitral valve is normal in structure. Mild mitral valve regurgitation. No evidence of mitral valve stenosis. MV peak gradient, 4.9 mmHg. The mean mitral valve gradient is 2.0 mmHg. Tricuspid Valve: The tricuspid valve is normal in  Steven Welch [id number] DOB: 23-Apr-1940 DOA: 01/07/2021  PCP: Steven Pitch, MD  Admit date: 01/07/2021 Discharge date: 01/12/2021  Admitted From: Home Disposition: Home.  Patient declined SNF  Recommendations for Outpatient Follow-up:  Follow up with PCP in 1 week Please obtain BMP/CBC in one week Please follow up with a Dentist this week Follow-up with cardiology in 2 weeks  Home Health: Yes   Discharge Condition:Stable CODE STATUS:full Diet recommendation: Heart Healthy  Brief/Interim Summary: Per Steven Welch is a 80 y.o. male with medical history significant of HTN, HLD, GERD, OA of knees, came with increasing right facial swelling and pain and generalized weakness.   Symptoms started one week ago, with a increasing right mandibular tooth ache, then left jaw pain and swelling, no problem swelling normal food and no problem breathing. Episodes of chills but no fever, no chest pain or palpitations. Last 2-3 days, right jaw pain and swelling became worse, with increasing generalized weakness.   ED Course: Low fever 36f 100.5 F, afib with RVR, BP borderline low. WBC 15.2. CT of face showed right mandible soft tissue swelling and dental dental decay, no abscess seen. Lactic acid=1.6. Cardiology was consulted.  Patient was started on IV biotics.  PT OT was consulted they recommended SNF but he declined. Patient did have CT of the head and CT maxillary facial with results below.  There was no soft tissue abscess on the CT maxillofacial.  Once his swelling and symptoms improved he was switched to p.o. antibiotics to complete the course on discharge.  Sepsis Likely due to submandibular cellulitis/infection.  Ct without abscess. Has tooth decay, was suppose to f/u with dentist soon. Was started on IV Unasyn and transition to p.o. on discharge 1 out of 2 sets of blood cultures with staph hominis.  Repeat blood cultures to date negative        New onset of afib with  RVR Cardiology was consulted TSH normal Echo normal EF Repleted electrolytes Started on Eliquis discussed with patient about risk of brain hemorrhage and GI bleed he verbalizes an understanding. Continue beta-blockers  follow-up with cardiology as outpatient Per cardiology No immediate plans for cardioversion to restore normal sinus rhythm, this could be discussed with him as outpatient after 4 weeks of anticoagulation     Deconditioning PT rec. SNF.  But patient declined   HTN Patient's blood pressure on lower side was started on midodrine.  Since he needed the beta-blockers for his atrial fibrillation ventricular rate.     GERD Continue PPI   Hypokalemia- Replaced and stable         Discharge Diagnoses:  Active Problems:   Sepsis ([place])   A-fib [facility])    Discharge Instructions  Discharge Instructions     Call MD for:  severe uncontrolled pain   Complete by: As directed    Call MD for:  temperature >100.4   Complete by: As directed    Diet - low sodium heart healthy   Complete by: As directed    Discharge instructions   Complete by: As directed    Follow up with your dentist this week Follow up with Dr. Rockey Situ cardiology in one week, refills for heart medications done by him Monitor you blood pressure F/u with primary care in one week to discuss blood pressure meds   Increase activity slowly   Complete by: As directed       Allergies as of 01/11/2021   No Known Allergies      Medication List  right mandibular posterior premolar and posterior molars with periapical lucency. Limited intracranial: No abnormal enhancement. Parenchymal volume loss. IMPRESSION: Soft tissue swelling along the body of the mandible on the right. No acute fracture. Significant right mandibular dental decay is noted and may be the source of above. There is no soft tissue abscess. Electronically Signed   By: Steven Welch M.D.   On: 01/07/2021 12:20   DG Chest Port 1 View  Result Date: 01/07/2021 CLINICAL DATA:  Weakness.  Recent falls. EXAM: PORTABLE CHEST 1 VIEW COMPARISON:  None. FINDINGS:  Heart size is normal. No pleural effusion or edema. Scarring is again noted within the left lower lobe. No superimposed airspace consolidation. Degenerative changes are noted within both glenohumeral joints. IMPRESSION: 1. No acute cardiopulmonary abnormalities. 2. 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 Electronically Signed   By: Steven Welch M.D.   On: 01/07/2021 12:13   ECHOCARDIOGRAM COMPLETE  Result Date: 01/08/2021    ECHOCARDIOGRAM REPORT   Patient Name:   Steven Welch Date of Exam: 01/08/2021 Medical Rec #:  [id number]    Height:       70.0 in Accession #:    [id number]   Weight:       211.0 lb Date of Birth:  [birth year]     BSA:          2.135 m Patient Age:    80 years     BP:           103/72 mmHg Patient Gender: M            HR:           63 bpm. Exam Location:  [facility] Procedure: 2D Echo, Color Doppler and Cardiac Doppler Indications:     R94.31 Abnormal ECG  History:         Patient has no prior history of Echocardiogram examinations.                  Risk Factors:Hypertension and Dyslipidemia.  Sonographer:     Charmayne Sheer Referring Phys:  [id number] Steven Welch Diagnosing Phys: Steven Rogue MD  Sonographer Comments: Suboptimal parasternal window and suboptimal subcostal window. Image acquisition challenging due to patient body habitus. IMPRESSIONS  1. Left ventricular ejection fraction, by estimation, is 60 to 65%. The left ventricle has normal function. The left ventricle has no regional wall motion abnormalities. There is mild left ventricular hypertrophy. Left ventricular diastolic parameters are indeterminate.  2. Right ventricular systolic function is normal. The right ventricular size is normal.  3. The mitral valve is normal in structure. Mild mitral valve regurgitation. No evidence of mitral stenosis.  4. Rhythm is atrial fibrillation FINDINGS  Left Ventricle: Left ventricular ejection fraction, by estimation, is 60 to 65%. The left ventricle has normal function. The left ventricle has no  regional wall motion abnormalities. The left ventricular internal cavity size was normal in size. There is  mild left ventricular hypertrophy. Left ventricular diastolic parameters are indeterminate. Right Ventricle: The right ventricular size is normal. No increase in right ventricular wall thickness. Right ventricular systolic function is normal. Left Atrium: Left atrial size was normal in size. Right Atrium: Right atrial size was normal in size. Pericardium: There is no evidence of pericardial effusion. Mitral Valve: The mitral valve is normal in structure. Mild mitral valve regurgitation. No evidence of mitral valve stenosis. MV peak gradient, 4.9 mmHg. The mean mitral valve gradient is 2.0 mmHg. Tricuspid Valve: The tricuspid valve is normal in  Steven Welch [id number] DOB: 23-Apr-1940 DOA: 01/07/2021  PCP: Steven Pitch, MD  Admit date: 01/07/2021 Discharge date: 01/12/2021  Admitted From: Home Disposition: Home.  Patient declined SNF  Recommendations for Outpatient Follow-up:  Follow up with PCP in 1 week Please obtain BMP/CBC in one week Please follow up with a Dentist this week Follow-up with cardiology in 2 weeks  Home Health: Yes   Discharge Condition:Stable CODE STATUS:full Diet recommendation: Heart Healthy  Brief/Interim Summary: Per Steven Welch is a 80 y.o. male with medical history significant of HTN, HLD, GERD, OA of knees, came with increasing right facial swelling and pain and generalized weakness.   Symptoms started one week ago, with a increasing right mandibular tooth ache, then left jaw pain and swelling, no problem swelling normal food and no problem breathing. Episodes of chills but no fever, no chest pain or palpitations. Last 2-3 days, right jaw pain and swelling became worse, with increasing generalized weakness.   ED Course: Low fever 36f 100.5 F, afib with RVR, BP borderline low. WBC 15.2. CT of face showed right mandible soft tissue swelling and dental dental decay, no abscess seen. Lactic acid=1.6. Cardiology was consulted.  Patient was started on IV biotics.  PT OT was consulted they recommended SNF but he declined. Patient did have CT of the head and CT maxillary facial with results below.  There was no soft tissue abscess on the CT maxillofacial.  Once his swelling and symptoms improved he was switched to p.o. antibiotics to complete the course on discharge.  Sepsis Likely due to submandibular cellulitis/infection.  Ct without abscess. Has tooth decay, was suppose to f/u with dentist soon. Was started on IV Unasyn and transition to p.o. on discharge 1 out of 2 sets of blood cultures with staph hominis.  Repeat blood cultures to date negative        New onset of afib with  RVR Cardiology was consulted TSH normal Echo normal EF Repleted electrolytes Started on Eliquis discussed with patient about risk of brain hemorrhage and GI bleed he verbalizes an understanding. Continue beta-blockers  follow-up with cardiology as outpatient Per cardiology No immediate plans for cardioversion to restore normal sinus rhythm, this could be discussed with him as outpatient after 4 weeks of anticoagulation     Deconditioning PT rec. SNF.  But patient declined   HTN Patient's blood pressure on lower side was started on midodrine.  Since he needed the beta-blockers for his atrial fibrillation ventricular rate.     GERD Continue PPI   Hypokalemia- Replaced and stable         Discharge Diagnoses:  Active Problems:   Sepsis ([place])   A-fib [facility])    Discharge Instructions  Discharge Instructions     Call MD for:  severe uncontrolled pain   Complete by: As directed    Call MD for:  temperature >100.4   Complete by: As directed    Diet - low sodium heart healthy   Complete by: As directed    Discharge instructions   Complete by: As directed    Follow up with your dentist this week Follow up with Dr. Rockey Situ cardiology in one week, refills for heart medications done by him Monitor you blood pressure F/u with primary care in one week to discuss blood pressure meds   Increase activity slowly   Complete by: As directed       Allergies as of 01/11/2021   No Known Allergies      Medication List  Steven Welch STM:[id number] DOB: 23-Apr-1940 DOA: 01/07/2021  PCP: Steven Pitch, MD  Admit date: 01/07/2021 Discharge date: 01/12/2021  Admitted From: Home Disposition: Home.  Patient declined SNF  Recommendations for Outpatient Follow-up:  Follow up with PCP in 1 week Please obtain BMP/CBC in one week Please follow up with a Dentist this week Follow-up with cardiology in 2 weeks  Home Health: Yes   Discharge Condition:Stable CODE STATUS:full Diet recommendation: Heart Healthy  Brief/Interim Summary: Per Steven Welch is a 80 y.o. male with medical history significant of HTN, HLD, GERD, OA of knees, came with increasing right facial swelling and pain and generalized weakness.   Symptoms started one week ago, with a increasing right mandibular tooth ache, then left jaw pain and swelling, no problem swelling normal food and no problem breathing. Episodes of chills but no fever, no chest pain or palpitations. Last 2-3 days, right jaw pain and swelling became worse, with increasing generalized weakness.   ED Course: Low fever 36f 100.5 F, afib with RVR, BP borderline low. WBC 15.2. CT of face showed right mandible soft tissue swelling and dental dental decay, no abscess seen. Lactic acid=1.6. Cardiology was consulted.  Patient was started on IV biotics.  PT OT was consulted they recommended SNF but he declined. Patient did have CT of the head and CT maxillary facial with results below.  There was no soft tissue abscess on the CT maxillofacial.  Once his swelling and symptoms improved he was switched to p.o. antibiotics to complete the course on discharge.  Sepsis Likely due to submandibular cellulitis/infection.  Ct without abscess. Has tooth decay, was suppose to f/u with dentist soon. Was started on IV Unasyn and transition to p.o. on discharge 1 out of 2 sets of blood cultures with staph hominis.  Repeat blood cultures to date negative        New onset of afib with  RVR Cardiology was consulted TSH normal Echo normal EF Repleted electrolytes Started on Eliquis discussed with patient about risk of brain hemorrhage and GI bleed he verbalizes an understanding. Continue beta-blockers  follow-up with cardiology as outpatient Per cardiology No immediate plans for cardioversion to restore normal sinus rhythm, this could be discussed with him as outpatient after 4 weeks of anticoagulation     Deconditioning PT rec. SNF.  But patient declined   HTN Patient's blood pressure on lower side was started on midodrine.  Since he needed the beta-blockers for his atrial fibrillation ventricular rate.     GERD Continue PPI   Hypokalemia- Replaced and stable         Discharge Diagnoses:  Active Problems:   Sepsis ([place])   A-fib [facility])    Discharge Instructions  Discharge Instructions     Call MD for:  severe uncontrolled pain   Complete by: As directed    Call MD for:  temperature >100.4   Complete by: As directed    Diet - low sodium heart healthy   Complete by: As directed    Discharge instructions   Complete by: As directed    Follow up with your dentist this week Follow up with Dr. Rockey Situ cardiology in one week, refills for heart medications done by him Monitor you blood pressure F/u with primary care in one week to discuss blood pressure meds   Increase activity slowly   Complete by: As directed       Allergies as of 01/11/2021   No Known Allergies      Medication List  Steven Welch STM:[id number] DOB: 23-Apr-1940 DOA: 01/07/2021  PCP: Steven Pitch, MD  Admit date: 01/07/2021 Discharge date: 01/12/2021  Admitted From: Home Disposition: Home.  Patient declined SNF  Recommendations for Outpatient Follow-up:  Follow up with PCP in 1 week Please obtain BMP/CBC in one week Please follow up with a Dentist this week Follow-up with cardiology in 2 weeks  Home Health: Yes   Discharge Condition:Stable CODE STATUS:full Diet recommendation: Heart Healthy  Brief/Interim Summary: Per Steven Welch is a 80 y.o. male with medical history significant of HTN, HLD, GERD, OA of knees, came with increasing right facial swelling and pain and generalized weakness.   Symptoms started one week ago, with a increasing right mandibular tooth ache, then left jaw pain and swelling, no problem swelling normal food and no problem breathing. Episodes of chills but no fever, no chest pain or palpitations. Last 2-3 days, right jaw pain and swelling became worse, with increasing generalized weakness.   ED Course: Low fever 36f 100.5 F, afib with RVR, BP borderline low. WBC 15.2. CT of face showed right mandible soft tissue swelling and dental dental decay, no abscess seen. Lactic acid=1.6. Cardiology was consulted.  Patient was started on IV biotics.  PT OT was consulted they recommended SNF but he declined. Patient did have CT of the head and CT maxillary facial with results below.  There was no soft tissue abscess on the CT maxillofacial.  Once his swelling and symptoms improved he was switched to p.o. antibiotics to complete the course on discharge.  Sepsis Likely due to submandibular cellulitis/infection.  Ct without abscess. Has tooth decay, was suppose to f/u with dentist soon. Was started on IV Unasyn and transition to p.o. on discharge 1 out of 2 sets of blood cultures with staph hominis.  Repeat blood cultures to date negative        New onset of afib with  RVR Cardiology was consulted TSH normal Echo normal EF Repleted electrolytes Started on Eliquis discussed with patient about risk of brain hemorrhage and GI bleed he verbalizes an understanding. Continue beta-blockers  follow-up with cardiology as outpatient Per cardiology No immediate plans for cardioversion to restore normal sinus rhythm, this could be discussed with him as outpatient after 4 weeks of anticoagulation     Deconditioning PT rec. SNF.  But patient declined   HTN Patient's blood pressure on lower side was started on midodrine.  Since he needed the beta-blockers for his atrial fibrillation ventricular rate.     GERD Continue PPI   Hypokalemia- Replaced and stable         Discharge Diagnoses:  Active Problems:   Sepsis ([place])   A-fib [facility])    Discharge Instructions  Discharge Instructions     Call MD for:  severe uncontrolled pain   Complete by: As directed    Call MD for:  temperature >100.4   Complete by: As directed    Diet - low sodium heart healthy   Complete by: As directed    Discharge instructions   Complete by: As directed    Follow up with your dentist this week Follow up with Dr. Rockey Situ cardiology in one week, refills for heart medications done by him Monitor you blood pressure F/u with primary care in one week to discuss blood pressure meds   Increase activity slowly   Complete by: As directed       Allergies as of 01/11/2021   No Known Allergies      Medication List  Steven Welch STM:[id number] DOB: 23-Apr-1940 DOA: 01/07/2021  PCP: Steven Pitch, MD  Admit date: 01/07/2021 Discharge date: 01/12/2021  Admitted From: Home Disposition: Home.  Patient declined SNF  Recommendations for Outpatient Follow-up:  Follow up with PCP in 1 week Please obtain BMP/CBC in one week Please follow up with a Dentist this week Follow-up with cardiology in 2 weeks  Home Health: Yes   Discharge Condition:Stable CODE STATUS:full Diet recommendation: Heart Healthy  Brief/Interim Summary: Per Steven Welch is a 80 y.o. male with medical history significant of HTN, HLD, GERD, OA of knees, came with increasing right facial swelling and pain and generalized weakness.   Symptoms started one week ago, with a increasing right mandibular tooth ache, then left jaw pain and swelling, no problem swelling normal food and no problem breathing. Episodes of chills but no fever, no chest pain or palpitations. Last 2-3 days, right jaw pain and swelling became worse, with increasing generalized weakness.   ED Course: Low fever 36f 100.5 F, afib with RVR, BP borderline low. WBC 15.2. CT of face showed right mandible soft tissue swelling and dental dental decay, no abscess seen. Lactic acid=1.6. Cardiology was consulted.  Patient was started on IV biotics.  PT OT was consulted they recommended SNF but he declined. Patient did have CT of the head and CT maxillary facial with results below.  There was no soft tissue abscess on the CT maxillofacial.  Once his swelling and symptoms improved he was switched to p.o. antibiotics to complete the course on discharge.  Sepsis Likely due to submandibular cellulitis/infection.  Ct without abscess. Has tooth decay, was suppose to f/u with dentist soon. Was started on IV Unasyn and transition to p.o. on discharge 1 out of 2 sets of blood cultures with staph hominis.  Repeat blood cultures to date negative        New onset of afib with  RVR Cardiology was consulted TSH normal Echo normal EF Repleted electrolytes Started on Eliquis discussed with patient about risk of brain hemorrhage and GI bleed he verbalizes an understanding. Continue beta-blockers  follow-up with cardiology as outpatient Per cardiology No immediate plans for cardioversion to restore normal sinus rhythm, this could be discussed with him as outpatient after 4 weeks of anticoagulation     Deconditioning PT rec. SNF.  But patient declined   HTN Patient's blood pressure on lower side was started on midodrine.  Since he needed the beta-blockers for his atrial fibrillation ventricular rate.     GERD Continue PPI   Hypokalemia- Replaced and stable         Discharge Diagnoses:  Active Problems:   Sepsis ([place])   A-fib [facility])    Discharge Instructions  Discharge Instructions     Call MD for:  severe uncontrolled pain   Complete by: As directed    Call MD for:  temperature >100.4   Complete by: As directed    Diet - low sodium heart healthy   Complete by: As directed    Discharge instructions   Complete by: As directed    Follow up with your dentist this week Follow up with Dr. Rockey Situ cardiology in one week, refills for heart medications done by him Monitor you blood pressure F/u with primary care in one week to discuss blood pressure meds   Increase activity slowly   Complete by: As directed       Allergies as of 01/11/2021   No Known Allergies      Medication List  Steven Welch STM:[id number] DOB: 23-Apr-1940 DOA: 01/07/2021  PCP: Steven Pitch, MD  Admit date: 01/07/2021 Discharge date: 01/12/2021  Admitted From: Home Disposition: Home.  Patient declined SNF  Recommendations for Outpatient Follow-up:  Follow up with PCP in 1 week Please obtain BMP/CBC in one week Please follow up with a Dentist this week Follow-up with cardiology in 2 weeks  Home Health: Yes   Discharge Condition:Stable CODE STATUS:full Diet recommendation: Heart Healthy  Brief/Interim Summary: Per Steven Welch is a 80 y.o. male with medical history significant of HTN, HLD, GERD, OA of knees, came with increasing right facial swelling and pain and generalized weakness.   Symptoms started one week ago, with a increasing right mandibular tooth ache, then left jaw pain and swelling, no problem swelling normal food and no problem breathing. Episodes of chills but no fever, no chest pain or palpitations. Last 2-3 days, right jaw pain and swelling became worse, with increasing generalized weakness.   ED Course: Low fever 36f 100.5 F, afib with RVR, BP borderline low. WBC 15.2. CT of face showed right mandible soft tissue swelling and dental dental decay, no abscess seen. Lactic acid=1.6. Cardiology was consulted.  Patient was started on IV biotics.  PT OT was consulted they recommended SNF but he declined. Patient did have CT of the head and CT maxillary facial with results below.  There was no soft tissue abscess on the CT maxillofacial.  Once his swelling and symptoms improved he was switched to p.o. antibiotics to complete the course on discharge.  Sepsis Likely due to submandibular cellulitis/infection.  Ct without abscess. Has tooth decay, was suppose to f/u with dentist soon. Was started on IV Unasyn and transition to p.o. on discharge 1 out of 2 sets of blood cultures with staph hominis.  Repeat blood cultures to date negative        New onset of afib with  RVR Cardiology was consulted TSH normal Echo normal EF Repleted electrolytes Started on Eliquis discussed with patient about risk of brain hemorrhage and GI bleed he verbalizes an understanding. Continue beta-blockers  follow-up with cardiology as outpatient Per cardiology No immediate plans for cardioversion to restore normal sinus rhythm, this could be discussed with him as outpatient after 4 weeks of anticoagulation     Deconditioning PT rec. SNF.  But patient declined   HTN Patient's blood pressure on lower side was started on midodrine.  Since he needed the beta-blockers for his atrial fibrillation ventricular rate.     GERD Continue PPI   Hypokalemia- Replaced and stable         Discharge Diagnoses:  Active Problems:   Sepsis ([place])   A-fib [facility])    Discharge Instructions  Discharge Instructions     Call MD for:  severe uncontrolled pain   Complete by: As directed    Call MD for:  temperature >100.4   Complete by: As directed    Diet - low sodium heart healthy   Complete by: As directed    Discharge instructions   Complete by: As directed    Follow up with your dentist this week Follow up with Dr. Rockey Situ cardiology in one week, refills for heart medications done by him Monitor you blood pressure F/u with primary care in one week to discuss blood pressure meds   Increase activity slowly   Complete by: As directed       Allergies as of 01/11/2021   No Known Allergies      Medication List  right mandibular posterior premolar and posterior molars with periapical lucency. Limited intracranial: No abnormal enhancement. Parenchymal volume loss. IMPRESSION: Soft tissue swelling along the body of the mandible on the right. No acute fracture. Significant right mandibular dental decay is noted and may be the source of above. There is no soft tissue abscess. Electronically Signed   By: Steven Welch M.D.   On: 01/07/2021 12:20   DG Chest Port 1 View  Result Date: 01/07/2021 CLINICAL DATA:  Weakness.  Recent falls. EXAM: PORTABLE CHEST 1 VIEW COMPARISON:  None. FINDINGS:  Heart size is normal. No pleural effusion or edema. Scarring is again noted within the left lower lobe. No superimposed airspace consolidation. Degenerative changes are noted within both glenohumeral joints. IMPRESSION: 1. No acute cardiopulmonary abnormalities. 2. 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 Electronically Signed   By: Steven Welch M.D.   On: 01/07/2021 12:13   ECHOCARDIOGRAM COMPLETE  Result Date: 01/08/2021    ECHOCARDIOGRAM REPORT   Patient Name:   Steven Welch Date of Exam: 01/08/2021 Medical Rec #:  [id number]    Height:       70.0 in Accession #:    [id number]   Weight:       211.0 lb Date of Birth:  [birth year]     BSA:          2.135 m Patient Age:    80 years     BP:           103/72 mmHg Patient Gender: M            HR:           63 bpm. Exam Location:  [facility] Procedure: 2D Echo, Color Doppler and Cardiac Doppler Indications:     R94.31 Abnormal ECG  History:         Patient has no prior history of Echocardiogram examinations.                  Risk Factors:Hypertension and Dyslipidemia.  Sonographer:     Charmayne Sheer Referring Phys:  [id number] Steven Welch Diagnosing Phys: Steven Rogue MD  Sonographer Comments: Suboptimal parasternal window and suboptimal subcostal window. Image acquisition challenging due to patient body habitus. IMPRESSIONS  1. Left ventricular ejection fraction, by estimation, is 60 to 65%. The left ventricle has normal function. The left ventricle has no regional wall motion abnormalities. There is mild left ventricular hypertrophy. Left ventricular diastolic parameters are indeterminate.  2. Right ventricular systolic function is normal. The right ventricular size is normal.  3. The mitral valve is normal in structure. Mild mitral valve regurgitation. No evidence of mitral stenosis.  4. Rhythm is atrial fibrillation FINDINGS  Left Ventricle: Left ventricular ejection fraction, by estimation, is 60 to 65%. The left ventricle has normal function. The left ventricle has no  regional wall motion abnormalities. The left ventricular internal cavity size was normal in size. There is  mild left ventricular hypertrophy. Left ventricular diastolic parameters are indeterminate. Right Ventricle: The right ventricular size is normal. No increase in right ventricular wall thickness. Right ventricular systolic function is normal. Left Atrium: Left atrial size was normal in size. Right Atrium: Right atrial size was normal in size. Pericardium: There is no evidence of pericardial effusion. Mitral Valve: The mitral valve is normal in structure. Mild mitral valve regurgitation. No evidence of mitral valve stenosis. MV peak gradient, 4.9 mmHg. The mean mitral valve gradient is 2.0 mmHg. Tricuspid Valve: The tricuspid valve is normal in  right mandibular posterior premolar and posterior molars with periapical lucency. Limited intracranial: No abnormal enhancement. Parenchymal volume loss. IMPRESSION: Soft tissue swelling along the body of the mandible on the right. No acute fracture. Significant right mandibular dental decay is noted and may be the source of above. There is no soft tissue abscess. Electronically Signed   By: Steven Welch M.D.   On: 01/07/2021 12:20   DG Chest Port 1 View  Result Date: 01/07/2021 CLINICAL DATA:  Weakness.  Recent falls. EXAM: PORTABLE CHEST 1 VIEW COMPARISON:  None. FINDINGS:  Heart size is normal. No pleural effusion or edema. Scarring is again noted within the left lower lobe. No superimposed airspace consolidation. Degenerative changes are noted within both glenohumeral joints. IMPRESSION: 1. No acute cardiopulmonary abnormalities. 2. 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 Electronically Signed   By: Steven Welch M.D.   On: 01/07/2021 12:13   ECHOCARDIOGRAM COMPLETE  Result Date: 01/08/2021    ECHOCARDIOGRAM REPORT   Patient Name:   Steven Welch Date of Exam: 01/08/2021 Medical Rec #:  [id number]    Height:       70.0 in Accession #:    [id number]   Weight:       211.0 lb Date of Birth:  [birth year]     BSA:          2.135 m Patient Age:    80 years     BP:           103/72 mmHg Patient Gender: M            HR:           63 bpm. Exam Location:  [facility] Procedure: 2D Echo, Color Doppler and Cardiac Doppler Indications:     R94.31 Abnormal ECG  History:         Patient has no prior history of Echocardiogram examinations.                  Risk Factors:Hypertension and Dyslipidemia.  Sonographer:     Charmayne Sheer Referring Phys:  [id number] Steven Welch Diagnosing Phys: Steven Rogue MD  Sonographer Comments: Suboptimal parasternal window and suboptimal subcostal window. Image acquisition challenging due to patient body habitus. IMPRESSIONS  1. Left ventricular ejection fraction, by estimation, is 60 to 65%. The left ventricle has normal function. The left ventricle has no regional wall motion abnormalities. There is mild left ventricular hypertrophy. Left ventricular diastolic parameters are indeterminate.  2. Right ventricular systolic function is normal. The right ventricular size is normal.  3. The mitral valve is normal in structure. Mild mitral valve regurgitation. No evidence of mitral stenosis.  4. Rhythm is atrial fibrillation FINDINGS  Left Ventricle: Left ventricular ejection fraction, by estimation, is 60 to 65%. The left ventricle has normal function. The left ventricle has no  regional wall motion abnormalities. The left ventricular internal cavity size was normal in size. There is  mild left ventricular hypertrophy. Left ventricular diastolic parameters are indeterminate. Right Ventricle: The right ventricular size is normal. No increase in right ventricular wall thickness. Right ventricular systolic function is normal. Left Atrium: Left atrial size was normal in size. Right Atrium: Right atrial size was normal in size. Pericardium: There is no evidence of pericardial effusion. Mitral Valve: The mitral valve is normal in structure. Mild mitral valve regurgitation. No evidence of mitral valve stenosis. MV peak gradient, 4.9 mmHg. The mean mitral valve gradient is 2.0 mmHg. Tricuspid Valve: The tricuspid valve is normal in

## 2021-01-11 NOTE — TOC Transition Note (Addendum)
Transition of Care [facility]) - CM/SW Discharge Note   Patient Details  Name: Maude Gloor MRN: [id number] Date of Birth: 12-30-1940  Transition of Care [facility]) CM/SW Contact:  Eileen Stanford, LCSW Phone Number: 01/11/2021, 12:06 PM   Clinical Narrative:   DME ordered through [facility]. CSW has reached out to several [facility] agencies to see if they could service pt.  [facility]-- not accepted [facility]-- not accepted [facility]-- not accepted [facility] --reviewing referral [facility]-- revewing referral [facility]--not accepted      Final next level of care: Home w Home Health Services Barriers to Discharge: No Barriers Identified   Patient Goals and CMS Choice   CMS Medicare.gov Compare Post Acute Care list provided to:: Patient    Discharge Placement                  Name of family member notified: daughter present at dc Patient and family notified of of transfer: 01/11/21  Discharge Plan and Services     Post Acute Care Choice: [place]          DME Arranged: Gilford Rile, Bedside commode, Shower stool DME Agency: [facility] Date DME Agency Contacted: 01/11/21 Time DME Agency Contacted: 1206 Representative spoke with at DME Agency: rhonda            Social Determinants of Health ([place]) Interventions     Readmission Risk Interventions No flowsheet data found.

## 2021-01-11 NOTE — Progress Notes (Signed)
Order to discharge pt home.  Discharge instructions/AVS given to patient and reviewed - education provided as needed.  Pt and wife at bedside stated they already had walker and BSC at home.  Pt and spouse decline additional home assistive devices at this time.  Pt advised to call PCP and/or come back to the hospital if there are any problems. Pt verbalized understanding.

## 2021-01-11 NOTE — Plan of Care (Signed)

## 2021-01-11 NOTE — Care Management Important Message (Signed)
Important Message  Patient Details  Name: Steven Welch MRN: [id number] Date of Birth: 29-May-1940   Medicare Important Message Given:  Yes (spoke with by telephone at [phone] to explain letter, no copy needed.)     Tommy Medal 01/11/2021, 11:59 AM

## 2021-01-12 LAB — CULTURE, BLOOD (ROUTINE X 2): Culture: NO GROWTH

## 2021-01-15 LAB — CULTURE, BLOOD (ROUTINE X 2)
Culture: NO GROWTH
Culture: NO GROWTH
Special Requests: ADEQUATE
Special Requests: ADEQUATE

## 2021-01-18 ENCOUNTER — Ambulatory Visit: Payer: Medicare Other | Admitting: Physician Assistant

## 2021-01-18 ENCOUNTER — Encounter: Payer: Self-pay | Admitting: Physician Assistant

## 2021-01-18 ENCOUNTER — Other Ambulatory Visit: Payer: Self-pay

## 2021-01-18 VITALS — BP 160/80 | HR 43 | Ht 70.0 in | Wt 222.0 lb

## 2021-01-18 DIAGNOSIS — I5033 Acute on chronic diastolic (congestive) heart failure: Secondary | ICD-10-CM

## 2021-01-18 DIAGNOSIS — I10 Essential (primary) hypertension: Secondary | ICD-10-CM

## 2021-01-18 DIAGNOSIS — Z79899 Other long term (current) drug therapy: Secondary | ICD-10-CM

## 2021-01-18 DIAGNOSIS — R531 Weakness: Secondary | ICD-10-CM

## 2021-01-18 DIAGNOSIS — R001 Bradycardia, unspecified: Secondary | ICD-10-CM

## 2021-01-18 DIAGNOSIS — I480 Paroxysmal atrial fibrillation: Secondary | ICD-10-CM | POA: Diagnosis not present

## 2021-01-18 DIAGNOSIS — Z8639 Personal history of other endocrine, nutritional and metabolic disease: Secondary | ICD-10-CM

## 2021-01-18 MED ORDER — FUROSEMIDE 20 MG PO TABS: 20.0000 mg | ORAL_TABLET | Freq: Every day | ORAL | 3 refills | Status: DC

## 2021-01-18 MED ORDER — SPIRONOLACTONE 25 MG PO TABS: 25.0000 mg | ORAL_TABLET | Freq: Every day | ORAL | 3 refills | Status: DC

## 2021-01-18 MED ORDER — POTASSIUM CHLORIDE ER 10 MEQ PO TBCR: 10.0000 meq | EXTENDED_RELEASE_TABLET | Freq: Every day | ORAL | 3 refills | Status: DC

## 2021-01-18 NOTE — Patient Instructions (Addendum)
Medication Instructions:  - Your physician has recommended you make the following change in your medication:   1) STOP metoprolol succinate  2) START spironolactone 25 mg- take 1 tablet by mouth once daily   3) START furosemide (lasix) 20 mg- take 1 tablet by mouth once daily   4) START potassium 10 meq- take 1 tablet by mouth once daily   *If you need a refill on your cardiac medications before your next appointment, please call your pharmacy*   Lab Work: - Your physician recommends that you have lab work today: BMP/ CBC  If you have labs (blood work) drawn today and your tests are completely normal, you will receive your results only by: [facility] (if you have [place]) OR A paper copy in the mail If you have any lab test that is abnormal or we need to change your treatment, we will call you to review the results.   Testing/Procedures: - none ordered   Follow-Up: At [facility], you and your health needs are our priority.  As part of our continuing mission to provide you with exceptional heart care, we have created designated Provider Care Teams.  These Care Teams include your primary Cardiologist (physician) and Advanced Practice Providers (APPs -  Physician Assistants and Nurse Practitioners) who all work together to provide you with the care you need, when you need it.  We recommend signing up for the patient portal called "MyChart".  Sign up information is provided on this After Visit Summary.  MyChart is used to connect with patients for Virtual Visits (Telemedicine).  Patients are able to view lab/test results, encounter notes, upcoming appointments, etc.  Non-urgent messages can be sent to your provider as well.   To learn more about what you can do with MyChart, go to [email or web address].    Your next appointment:   2 week(s)  The format for your next appointment:   In Person  Provider:   You may see Ida Rogue, MD or one of the following Advanced  Practice Providers on your designated Care Team:   Murray Hodgkins, NP Christell Faith, PA-C Marrianne Mood, PA-C Cadence Kathlen Mody, [place]   Other Instructions  Spironolactone Tablets What is this medication? SPIRONOLACTONE (speer on [place] LAK tone) treats high blood pressure and heart failure. It may also be used to reduce swelling related to heart, kidney, or liver disease. It helps your kidneys remove more fluid and salt from your blood through the urine without losing too much potassium. It belongs to a group of medications called diuretics. This medicine may be used for other purposes; ask your health care provider or pharmacist if you have questions. COMMON BRAND NAME(S): Aldactone What should I tell my care team before I take this medication? They need to know if you have any of these conditions: Addison's disease or low adrenal gland function High blood level of potassium Kidney disease Liver disease An unusual or allergic reaction to spironolactone, other medications, foods, dyes, or preservatives Pregnant or trying to get pregnant Breast-feeding How should I use this medication? Take this medication by mouth. Take it as directed on the prescription label at the same time every day. You can take it with or without food. You should always take it the same way. Keep taking it unless your care team tells you to stop. Talk to your care team about the use of this medication in children. Special care may be needed. Overdosage: If you think you have taken too much of this medicine  contact a poison control center or emergency room at once. NOTE: This medicine is only for you. Do not share this medicine with others. What if I miss a dose? If you miss a dose, take it as soon as you can. If it is almost time for your next dose, take only that dose. Do not take double or extra doses. What may interact with this medication? Do not take this medication with any of the  following: Cidofovir Eplerenone Tranylcypromine This medication may also interact with the following: Aspirin Certain medications for blood pressure or heart disease like benazepril, lisinopril, losartan, valsartan Certain medications that treat or prevent blood clots like heparin and enoxaparin Cholestyramine Cyclosporine Digoxin Lithium Medications that relax muscles for surgery NSAIDs, medications for pain and inflammation, like ibuprofen or naproxen Other diuretics Potassium salts or supplements Steroid medications like prednisone or cortisone Trimethoprim This list may not describe all possible interactions. Give your health care provider a list of all the medicines, herbs, non-prescription drugs, or dietary supplements you use. Also tell them if you smoke, drink alcohol, or use illegal drugs. Some items may interact with your medicine. What should I watch for while using this medication? Visit your care team for regular checks on your progress. Check your blood pressure as directed. Ask your care team what your blood pressure should be. Also, find out when you should contact him or her. Do not treat yourself for coughs, colds, or pain while you are using this medication without asking your care team for advice. Some medications may increase your blood pressure. Check with your care team if you have severe diarrhea, nausea, and vomiting, or if you sweat a lot. The loss of too much body fluid may make it dangerous for you to take this medication. You may need to be on a special diet while taking this medication. Ask your care team. Also, find out how many glasses of fluid you need to drink each day. You may get drowsy or dizzy. Do not drive, use machinery, or do anything that needs mental alertness until you know how this medication affects you. Do not stand or sit up quickly, especially if you are an older patient. This reduces the risk of dizzy or fainting spells. Alcohol may interfere  with the effects of this medication. Avoid alcoholic drinks. Avoid salt substitutes unless you are told otherwise by your care team. What side effects may I notice from receiving this medication? Side effects that you should report to your care team as soon as possible: Allergic reactions-skin rash, itching, hives, swelling of the face, lips, tongue, or throat Dehydration-increased thirst, dry mouth, feeling faint or lightheaded, headache, dark yellow or brown urine High potassium level-muscle weakness, fast or irregular heartbeat Kidney injury-decrease in the amount of urine, swelling of the ankles, hands, or feet Low blood pressure-dizziness, feeling faint or lightheaded, blurry vision Low sodium level-muscle weakness, fatigue, dizziness, headache, confusion Side effects that usually do not require medical attention (report to your care team if they continue or are bothersome): Breast pain or tenderness Changes in sex drive or performance Dizziness Headache Irregular menstrual cycles or spotting Unexpected breast tissue growth This list may not describe all possible side effects. Call your doctor for medical advice about side effects. You may report side effects to FDA at 1-800-FDA-1088. Where should I keep my medication? Keep out of the reach of children and pets. Store below 25 degrees C (77 degrees F). Get rid of any unused medication after the expiration  date. To get rid of medications that are no longer needed or have expired: Take the medication to a medication take-back program. Check with your pharmacy or law enforcement to find a location. If you cannot return the medication, check the label or package insert to see if the medication should be thrown out in the garbage or flushed down the toilet. If you are not sure, ask your care team. If it is safe to put into the trash, take the medication out of the container. Mix the medication with cat litter, dirt, coffee grounds, or other  unwanted substance. Seal the mixture in a bag or container. Put it in the trash. NOTE: This sheet is a summary. It may not cover all possible information. If you have questions about this medicine, talk to your doctor, pharmacist, or health care provider.  2022 Elsevier/Gold Standard (2020-06-11 13:01:04)

## 2021-01-18 NOTE — Progress Notes (Addendum)
Office Visit    Patient Name: Steven Welch Date of Encounter: 01/18/2021  PCP:  Juluis Pitch, MD   [place]  Cardiologist: Dr. Rockey Situ Advanced Practice Provider:  No care team member to display Electrophysiologist:  None  :[id number]}   Chief Complaint    Chief Complaint  Patient presents with   Hospitalization Follow-up    80 y.o. male with history of atrial fibrillation with RVR, hypertension, hyperlipidemia, GERD, OA of the knees, and who is being seen today for hospital follow-up.  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 male with PMH as above.  Prior to his most recent admission, he had no significant prior cardiac history.  He lives at home with his wife.  He presented to [facility] ED 01/07/2021 for face pain and weakness.  He was found to have a low-grade fever and borderline BP.  He was noted to be in A. fib with RVR in the emergency department and admitted for sepsis.  He was treated with antibiotics.  He was also started on Eliquis.  He was maintained on midodrine for lower BP.  Echo during admission showed EF 60 to 65%, NR WMA.  Today, 01/18/2021, he returns to clinic and notes that he has overall been feeling well  since discharge. No CP with exertion or rest.  He initially felt weak, but his energy improved thereafter.  No reported dyspnea at rest or with exertion.  He has not monitoring his heart rate or blood pressure.  His wife is with him today and reports that she can start monitoring his BP at home.  He denies any presyncope or syncope.  No recent falls.  No signs or symptoms of bleeding.  He reports medication compliance.  Significant edema and weight gain noted today with patient report that he has not noticed any swelling or weight gain. He denies orthopnea, PND, early satiety, abdominal distention.  Diet reviewed -the wife reports cooking her husband chicken noodle soup, though she states she does not add any salt to it.  He does not eat any chips.  Home Medications   Current Outpatient Medications  Medication Instructions   acetaminophen (TYLENOL) 650 mg, Oral, 2 times daily   apixaban (ELIQUIS) 5 mg, Oral, 2 times daily   metoprolol succinate (TOPROL-XL) 12.5 mg, Oral, Daily   midodrine (PROAMATINE) 5 mg, Oral, 3 times daily with meals   pantoprazole (PROTONIX) 40 mg, Oral, Daily     Review of Systems    He denies chest pain, palpitations, dyspnea, pnd, orthopnea, n, v, dizziness, syncope, edema, weight gain, or early satiety.  All other systems reviewed and are otherwise negative except as noted above.  Physical Exam    VS:  BP (!) 160/80 (BP Location: Left Arm, Patient Position: Sitting, Cuff Size: Large)   Pulse (!) 43   Ht 5\' 10"  (1.778 m)   Wt 222 lb (100.7 kg)   SpO2 96%   BMI 31.85 kg/m  , BMI Body mass index is 31.85 kg/m. GEN: Well nourished, well developed, in no acute distress. HEENT: normal. Neck: Supple, no JVD, carotid bruits, or masses. Cardiac: RRR, no murmurs, rubs, or gallops. No clubbing, cyanosis, 2+ bilateral edema.  Radials/DP/PT 2+ and equal bilaterally.  Respiratory:  Respirations regular and unlabored, reduced right base breath sounds. GI: firm, abdominal  distention , BS + x 4. MS: no deformity or atrophy. Skin: warm and dry, no rash. Neuro:  Strength and sensation are intact. Psych: Normal affect.  Accessory Clinical Findings    ECG personally reviewed by me today -sinus bradycardia, 43 bpm, right bundle branch block, poor R wave progression in the inferior leads- no acute changes.  VITALS Reviewed today   Temp Readings from Last 3 Encounters:  01/11/21 98 F (36.7 C) (Oral)  01/04/19 98.1 F (36.7 C) (Oral)   BP Readings from Last 3 Encounters:  01/18/21 (!) 160/80  01/11/21 129/77  01/04/19 128/72   Pulse Readings from Last 3 Encounters:  01/18/21 (!) 43  01/11/21 62  01/04/19 72    Wt Readings from Last 3 Encounters:  01/18/21 222 lb (100.7 kg)  01/07/21 211 lb (95.7 kg)  01/01/19 205 lb 0.4 oz (93 kg)     LABS  reviewed today   Lab Results  Component Value Date   WBC 6.4 01/10/2021   HGB 12.7 (L) 01/10/2021   HCT 37.2 (L) 01/10/2021   MCV 96.6 01/10/2021   PLT 201 01/10/2021   Lab Results  Component Value Date   CREATININE 0.85 01/09/2021   BUN 20 01/09/2021   NA 136 01/09/2021   K 3.9 01/10/2021   CL 101 01/09/2021   CO2 27 01/09/2021   Lab Results  Component Value Date   ALT 19 01/07/2021   AST 20 01/07/2021   ALKPHOS 40 01/07/2021   BILITOT 0.9 01/07/2021   No results found for: CHOL, HDL, LDLCALC, LDLDIRECT, TRIG, CHOLHDL  No results found for: HGBA1C Lab Results  Component Value Date   TSH 0.615 01/07/2021     STUDIES/PROCEDURES reviewed today   Echo 01/08/21  1. Left ventricular ejection fraction, by estimation, is 60 to 65%. The  left ventricle has normal function. The left ventricle has no regional  wall motion abnormalities. There is mild left ventricular hypertrophy.  Left ventricular diastolic parameters  are indeterminate.   2. Right ventricular systolic function is normal. The right ventricular  size is normal.   3. The mitral valve is normal in structure. Mild mitral  valve  regurgitation. No evidence of mitral stenosis.   4. Rhythm is atrial fibrillation   Assessment & Plan    Paroxysmal atrial fibrillation  --Recently admitted with paroxysmal atrial fibrillation in the setting of sepsis.  Rates during admission up into the 130s.  He was started on Toprol 12.5 mg daily.  EKG today shows SB with rate of 43 bpm.  Will discontinue Toprol-XL and allow for washout with repeat EKG at RTC.  He reports compliance with Eliquis 5 mg twice daily.  CHA2DS2VASc score of at least  3 with recommendation for ongoing anticoagulation to reduce the risk  of thromboembolic event.  We will check a CBC and BMET today.  Anticoagulation precautions reviewed.  He was hypokalemic during admission, and we will recheck electrolytes today.   Sinus bradycardia --Rate today 43 bpm.  Discontinue Toprol.  Recheck at RTC with repeat EKG.  Consider is contributing to part of his weakness and fatigue.  Weakness/deconditioning --Increased activity recommended  Essential hypertension --Started on midodrine at his admission due to low BP.  BP today elevated with significant weight gain from that of discharge and patient volume up on exam.  Start spironolactone 25 mg daily and Lasix 10 mg daily with KCl tab 10 M EQ.  BMET today and at RTC in 1 to 2 weeks.  Recommend daily weights and BP monitoring as discussed with patient and wife today.  Ideally, wean off of midodrine at RTC.  Given the number of medication changes as above, will keep midodrine on the medication list for now.  Volume overload/HFpEF --Volume up today.  01/08/2021 echo with EF 60 to 65%, NR WMA, mild LVH, LV diastolic parameters indeterminate.  Volume up on exam.  Reviewed salt and fluid restrictions.  Consider that elevated volume status could be due to recent Afib. Given his volume status, will start Lasix 20 mg daily with KCl tab 10 M EQ.  In addition, start spironolactone 25 mg daily.  BMET today and in 1 to 2 weeks.  At RTC,  recommend ordering repeat echo if still in sinus rhythm at that time and pending reassessment of volume status - will defer until after diuresis.   H/o hypokalemia --Repeat BMET today  Medication changes: Start spironolactone 25 mg daily, Lasix 20 mg daily, KCl tab 10 M EQ daily Labs ordered: BMET today and at RTC in 1 to 2 weeks Studies / Imaging ordered: None Future considerations: Repeat echo at RTC and in 1 to 2 weeks/at the time of volume status recheck  Disposition: RTC 1 to 2 weeks  *Please be aware that the above documentation was completed voice recognition software and may contain dictation errors.      Arvil Chaco, PA-C 01/18/2021

## 2021-01-19 LAB — CBC
Hematocrit: 38 % (ref 37.5–51.0)
Hemoglobin: 12.8 g/dL — ABNORMAL LOW (ref 13.0–17.7)
MCH: 32.6 pg (ref 26.6–33.0)
MCHC: 33.7 g/dL (ref 31.5–35.7)
MCV: 97 fL (ref 79–97)
Platelets: 351 10*3/uL (ref 150–450)
RBC: 3.93 x10E6/uL — ABNORMAL LOW (ref 4.14–5.80)
RDW: 13.8 % (ref 11.6–15.4)
WBC: 11.3 10*3/uL — ABNORMAL HIGH (ref 3.4–10.8)

## 2021-01-19 LAB — BASIC METABOLIC PANEL
BUN/Creatinine Ratio: 19 (ref 10–24)
BUN: 18 mg/dL (ref 8–27)
CO2: 23 mmol/L (ref 20–29)
Calcium: 9.1 mg/dL (ref 8.6–10.2)
Chloride: 105 mmol/L (ref 96–106)
Creatinine, Ser: 0.96 mg/dL (ref 0.76–1.27)
Glucose: 108 mg/dL — ABNORMAL HIGH (ref 70–99)
Potassium: 4.3 mmol/L (ref 3.5–5.2)
Sodium: 140 mmol/L (ref 134–144)
eGFR: 80 mL/min/{1.73_m2} (ref >59)

## 2021-01-21 ENCOUNTER — Telehealth: Payer: Self-pay | Admitting: *Deleted

## 2021-01-21 NOTE — Telephone Encounter (Signed)
-----   Message from Arvil Chaco, PA-C sent at 01/21/2021  1:15 PM EDT ----- Renal function stable and electrolytes at goal.  Please have him discontinue his potassium supplementation (KCL tab 45mEq). He should continue lasix and spironolactone.   WBC elevated, which is nonspecific. If any fevers or sx of cold/flu, he should contact his PCP  We will repeat a BMET at RTC

## 2021-01-21 NOTE — Telephone Encounter (Signed)
Left voicemail message to call back for review of results and recommendations.  

## 2021-01-22 NOTE — Telephone Encounter (Signed)
Able to reach pt regarding his recent lab work, Steven Welch, [facility] had a chance to review their results and advised   "Renal function stable and electrolytes at goal.  Please have him discontinue his potassium supplementation (KCL tab 50mEq). He should continue lasix and spironolactone.    WBC elevated, which is nonspecific. If any fevers or sx of cold/flu, he should contact his PCP   We will repeat a BMET at RTC"  All questions or concerns were address and no additional concerns at this time. Agreeable to plan, will call back for anything further.  Appt 11/10 with Cadence Kathlen Mody, PA-C  Med list updated, d/c potassium

## 2021-01-22 NOTE — Telephone Encounter (Signed)
Attempted to call pt. No answer. Lmtcb.  

## 2021-01-22 NOTE — Telephone Encounter (Signed)
Patient is returning the call  

## 2021-02-04 ENCOUNTER — Other Ambulatory Visit: Payer: Self-pay

## 2021-02-04 ENCOUNTER — Encounter: Payer: Self-pay | Admitting: Medical

## 2021-02-04 ENCOUNTER — Ambulatory Visit: Payer: Medicare Other | Admitting: Medical

## 2021-02-04 VITALS — BP 130/86 | HR 62 | Ht 70.0 in | Wt 205.4 lb

## 2021-02-04 DIAGNOSIS — I5032 Chronic diastolic (congestive) heart failure: Secondary | ICD-10-CM

## 2021-02-04 DIAGNOSIS — I480 Paroxysmal atrial fibrillation: Secondary | ICD-10-CM

## 2021-02-04 DIAGNOSIS — R531 Weakness: Secondary | ICD-10-CM

## 2021-02-04 DIAGNOSIS — I10 Essential (primary) hypertension: Secondary | ICD-10-CM

## 2021-02-04 DIAGNOSIS — R001 Bradycardia, unspecified: Secondary | ICD-10-CM

## 2021-02-04 NOTE — Patient Instructions (Addendum)
Medication Instructions:  Your physician has recommended you make the following change in your medication:   DECREASE Midodrine to 5 mg twice a day for 3 days THEN  DECREASE Midodrine to 5 mg once a day for 3 days THEN STOP medication if blood pressures are stable and above 100/50   *If you need a refill on your cardiac medications before your next appointment, please call your pharmacy*   Lab Work: BMET & CBC today  If you have labs (blood work) drawn today and your tests are completely normal, you will receive your results only by: [place] (if you have MyChart) OR A paper copy in the mail If you have any lab test that is abnormal or we need to change your treatment, we will call you to review the results.   Testing/Procedures: None   Follow-Up: At [facility], you and your health needs are our priority.  As part of our continuing mission to provide you with exceptional heart care, we have created designated Provider Care Teams.  These Care Teams include your primary Cardiologist (physician) and Advanced Practice Providers (APPs -  Physician Assistants and Nurse Practitioners) who all work together to provide you with the care you need, when you need it.   Your next appointment:   2 week(s)  The format for your next appointment:   In Person  Provider:   Ida Rogue, MD or Cadence Kathlen Mody, [place]

## 2021-02-04 NOTE — Progress Notes (Signed)
Cardiology Office Note:    Date:  02/04/2021   ID:  Steven Welch, DOB 1940-05-17, MRN [id number]  PCP:  Steven Pitch, MD  [facility] Cardiologist:  Dr. Arvid Right [facility] Electrophysiologist:  None   Referring MD: Steven Pitch, MD   Chief Complaint: 2 week follow-up  History of Present Illness:    Steven Welch is a 80 y.o. male with a hx of atrial fibrillation with RVR, hypertension, hyperlipidemia, GERD, OA of the knees, HFpEF, SB on BB who presents for 2 week follow-up.   Prior to his most recent admission, he had no significant prior cardiac history.  He lives at home with his wife.  He presented to [facility] ED 01/07/2021 for face pain and weakness.  He was found to have a low-grade fever and borderline BP.  He was noted to be in A. fib with RVR in the emergency department and admitted for sepsis.  He was treated with antibiotics.  He was also started on Eliquis.  He was maintained on midodrine for lower BP.  Echo during admission showed EF 60 to 65%, NR WMA.  Last seen 01/18/21 and was overall feeling well. EKG showed SB heart rate 43bpm. Toprol was stopped. Noted to bo volume up and started on lasix 20mg  daily and spironolactone 25mg  daily.   Today, the patient has been doing well since the last follow-up. Feels better than before. Energy is better. No shortness of breath, chest pain, orthopnea, pnd. EKG shows SR.    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   apixaban (ELIQUIS) 5 MG TABS tablet Take 1 tablet (5 mg total) by mouth 2 (two) times daily.   furosemide (LASIX) 20 MG tablet Take 1 tablet (20 mg total) by mouth daily.   midodrine (PROAMATINE) 5 MG tablet Take 1 tablet (5 mg total) by mouth 3 (three) times daily with meals.   pantoprazole (PROTONIX) 40 MG tablet Take 1 tablet (40 mg total) by mouth daily.   spironolactone (ALDACTONE) 25 MG tablet Take 1 tablet (25 mg total) by mouth daily.     Allergies:   Patient has no known allergies.   Social History   Socioeconomic History   Marital status: Married    Spouse name: Not on file   Number of children: Not on file   Years of education: Not on file   Highest education level: Not on file  Occupational History   Not on file  Tobacco Use   Smoking status: Never   Smokeless tobacco: Never  Vaping Use   Vaping Use: Never used  Substance and Sexual Activity  Alcohol use: Not Currently   Drug use: Never   Sexual activity: Not on file  Other Topics Concern   Not on file  Social History Narrative   Not on file   Social Determinants of Health   Financial Resource Strain: Not on file  Food Insecurity: Not on file  Transportation Needs: Not on file  Physical Activity: Not on file  Stress: Not on file  Social Connections: Not on file     Family History: The patient's family history includes CAD in his mother; Lung cancer in his father.  ROS:   Please see the history of present illness.     All other systems reviewed and are negative.  EKGs/Labs/Other Studies Reviewed:    The following studies were reviewed today:  Echo 01/08/21  1. Left ventricular ejection fraction, by estimation, is 60 to 65%. The  left ventricle has  normal function. The left ventricle has no regional  wall motion abnormalities. There is mild left ventricular hypertrophy.  Left ventricular diastolic parameters  are indeterminate.   2. Right ventricular systolic function is normal. The right ventricular  size is normal.   3. The mitral valve is normal in structure. Mild mitral valve  regurgitation. No evidence of mitral stenosis.   4. Rhythm is atrial fibrillation   EKG:  EKG is  ordered today.  The ekg ordered today demonstrates NSR, 62bpm, RBBB, nonspecific T wave changes  Recent Labs: 01/07/2021: ALT 19; TSH 0.615 01/09/2021: Magnesium 2.1 01/18/2021: BUN 18; Creatinine, Ser 0.96; Hemoglobin 12.8; Platelets 351; Potassium 4.3; Sodium 140  Recent Lipid Panel No results found for: CHOL, TRIG, HDL, CHOLHDL, VLDL, LDLCALC, LDLDIRECT   Physical Exam:    VS:  BP 130/86 (BP Location: Left Arm, Patient Position: Sitting, Cuff Size: Normal)   Pulse 62   Ht 5\' 10"  (1.778 m)   Wt 205 lb 6 oz (93.2 kg)   SpO2 98%   BMI 29.47 kg/m     Wt Readings from Last 3 Encounters:  02/04/21 205 lb 6 oz (93.2 kg)  01/18/21 222 lb (100.7 kg)  01/07/21 211 lb (95.7 kg)     GEN:  Well nourished, well developed in no acute distress HEENT: Normal NECK: No JVD; No carotid bruits LYMPHATICS: No lymphadenopathy CARDIAC: RRR, no murmurs, rubs, gallops RESPIRATORY:  Clear to auscultation without rales, wheezing or rhonchi  ABDOMEN: Soft, non-tender, non-distended MUSCULOSKELETAL:  No edema; No deformity  SKIN: Warm and dry NEUROLOGIC:  Alert and oriented x 3 PSYCHIATRIC:  Normal affect   ASSESSMENT:    1. Paroxysmal atrial fibrillation (HCC)   2. Weakness   3. Essential hypertension   4. Chronic diastolic heart failure ([place])   5. Sinus bradycardia    PLAN:    In order of problems listed above:  Paroxysmal Afib EKG shows SR. Toprol previously stopped for bradycardia. Continue stroke ppx with Eliquis for CHADSVASC of 3. Re-check CBC at  follow-up.   Sinus bradycardia Resolved after stopping BB  Weakness Improved once off BB.   HTN BP today 130/86. He is on midodrine 5mg  TID since hospitalization. Toprol stopped at the last visit for bradycardia. He is on lasix 20mg  daily and spironolactone 25mg  daily. Will try and wean midodrine 5mg  BID x 3 days>5mg  daily x 3 days> d/c midodrine if BP stable. Will reassess at follow-up  HFpEF Echo 10/14 showed LVEF 60-65%, mild LVH. Patient reports improved symptoms on lasix and spironolactone. He is euvolemic on exam today. BB stopped as above. BMET  today.   Disposition: Follow up in 2 week(s) with MD/APP    Signed, Apollo Timothy Ninfa Meeker, PA-C  02/04/2021 1:49 PM    [facility]

## 2021-02-05 LAB — BASIC METABOLIC PANEL
BUN/Creatinine Ratio: 19 (ref 10–24)
BUN: 20 mg/dL (ref 8–27)
CO2: 24 mmol/L (ref 20–29)
Calcium: 9.2 mg/dL (ref 8.6–10.2)
Chloride: 106 mmol/L (ref 96–106)
Creatinine, Ser: 1.07 mg/dL (ref 0.76–1.27)
Glucose: 108 mg/dL — ABNORMAL HIGH (ref 70–99)
Potassium: 4.6 mmol/L (ref 3.5–5.2)
Sodium: 143 mmol/L (ref 134–144)
eGFR: 71 mL/min/{1.73_m2} (ref >59)

## 2021-02-05 LAB — CBC
Hematocrit: 44.6 % (ref 37.5–51.0)
Hemoglobin: 14.7 g/dL (ref 13.0–17.7)
MCH: 32 pg (ref 26.6–33.0)
MCHC: 33 g/dL (ref 31.5–35.7)
MCV: 97 fL (ref 79–97)
Platelets: 285 10*3/uL (ref 150–450)
RBC: 4.6 x10E6/uL (ref 4.14–5.80)
RDW: 13.1 % (ref 11.6–15.4)
WBC: 8.2 10*3/uL (ref 3.4–10.8)

## 2021-03-01 ENCOUNTER — Other Ambulatory Visit: Payer: Self-pay

## 2021-03-01 ENCOUNTER — Ambulatory Visit: Payer: Medicare Other | Admitting: Cardiovascular Disease

## 2021-03-01 ENCOUNTER — Encounter: Payer: Self-pay | Admitting: Cardiovascular Disease

## 2021-03-01 VITALS — BP 160/82 | HR 67 | Ht 70.0 in | Wt 203.5 lb

## 2021-03-01 DIAGNOSIS — I10 Essential (primary) hypertension: Secondary | ICD-10-CM | POA: Diagnosis not present

## 2021-03-01 DIAGNOSIS — I4819 Other persistent atrial fibrillation: Secondary | ICD-10-CM

## 2021-03-01 MED ORDER — APIXABAN 5 MG PO TABS: 5.0000 mg | ORAL_TABLET | Freq: Two times a day (BID) | ORAL | 6 refills | Status: DC

## 2021-03-01 MED ORDER — LOSARTAN POTASSIUM 100 MG PO TABS: 100.0000 mg | ORAL_TABLET | Freq: Every day | ORAL | 3 refills | Status: DC

## 2021-03-01 NOTE — Progress Notes (Signed)
Cardiology Office Note  Date:  03/01/2021   ID:  Steven Welch, DOB 1940/11/01, MRN [id number]  PCP:  Juluis Pitch, MD   Chief Complaint  Patient presents with   2 week follow up     Patient c/o being off balance & unsteady. Medications reviewed by the patient verbally.    HPI:  Hilton Saephan is a 80 y.o. male with a hx of  atrial fibrillation with RVR,  hypertension,  hyperlipidemia,  GERD,  OA of the knees,  HFpEF,  SB on B-blocker Who presents for follow-up of his atrial fibrillation   presented to [facility] ED 01/07/2021 for face pain and weakness.   low-grade fever and borderline BP.   noted to be in A. fib with RVR in the emergency department and admitted for sepsis.  treated with antibiotics.   started on Eliquis.  maintained on midodrine for lower BP.   Echo during admission showed EF 60 to 65%, NR WMA.  Office visit 01/18/21  EKG showed SB heart rate 43bpm.  Toprol was stopped.  started on lasix 20mg  daily and spironolactone 25mg  daily.    Last office visit February 04, 2021, maintaining normal sinus rhythm  In follow-up today, he presents with his wife He is very slow to move around, slow to get from chair to exam table Gait instability, walks with a cane Wife reports that he sits all day in a chair, no exercise, minimal movement, watches TV all day He has no hobbies, no desire to do anything  He is not on half of his medications, reports that the Eliquis ran out, Eliquis ran out, not on Protonix He continues on spironolactone, potassium  Blood pressure elevated today  EKG personally reviewed by myself on todays visit Normal sinus rhythm rate 67 bpm right bundle branch block  PMH:   has a past medical history of Cyst of breast (1981), GERD (gastroesophageal reflux disease), Hepatitis (1988), History of shingles, Hyperlipidemia, Hypertension, Osteoarthritis of right hip, and Skin cancer of face.  PSH:    Past Surgical History:  Procedure Laterality Date    BREAST EXCISIONAL BIOPSY Right 1981   neg   COLON SURGERY     age 80 month old   TONSILLECTOMY     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   direct anterior approach   TOTAL HIP ARTHROPLASTY Right 01/01/2019   Procedure: TOTAL HIP ARTHROPLASTY ANTERIOR APPROACH;  Surgeon: Hessie Knows, MD;  Location: [facility] ORS;  Service: Orthopedics;  Laterality: Right;    Current Outpatient Medications  Medication Sig Dispense Refill   acetaminophen (TYLENOL) 650 MG CR tablet Take 650 mg by mouth 2 (two) times daily.     potassium chloride (KLOR-CON) 10 MEQ tablet Take 10 mEq by mouth daily.     spironolactone (ALDACTONE) 25 MG tablet Take 1 tablet (25 mg total) by mouth daily. 30 tablet 3   No current facility-administered medications for this visit.     Allergies:   Patient has no known allergies.   Social History:  The patient  reports that he has never smoked. He has never used smokeless tobacco. He reports that he does not currently use alcohol. He reports that he does not use drugs.   Family History:   family history includes CAD in his mother; Lung cancer in his father.    Review of Systems: Review of Systems  Constitutional: Negative.   HENT: Negative.    Respiratory: Negative.    Cardiovascular: Negative.   Gastrointestinal: Negative.   Musculoskeletal:  Negative.   Neurological: Negative.   Psychiatric/Behavioral: Negative.    All other systems reviewed and are negative.   PHYSICAL EXAM: VS:  BP (!) 160/82 (BP Location: Left Arm, Patient Position: Sitting, Cuff Size: Normal)   Pulse 67   Ht 5\' 10"  (1.778 m)   Wt 203 lb 8 oz (92.3 kg)   SpO2 97%   BMI 29.20 kg/m  , BMI Body mass index is 29.2 kg/m. GEN: Well nourished, well developed, in no acute distress HEENT: normal Neck: no JVD, carotid bruits, or masses Cardiac: RRR; no murmurs, rubs, or gallops,no edema  Respiratory:  clear to auscultation bilaterally, normal work of breathing GI: soft, nontender, nondistended, +  BS MS: no deformity or atrophy Skin: warm and dry, no rash Neuro:  Strength and sensation are intact Psych: euthymic mood, full affect    Recent Labs: 01/07/2021: ALT 19; TSH 0.615 01/09/2021: Magnesium 2.1 02/04/2021: BUN 20; Creatinine, Ser 1.07; Hemoglobin 14.7; Platelets 285; Potassium 4.6; Sodium 143    Lipid Panel No results found for: CHOL, HDL, LDLCALC, TRIG    Wt Readings from Last 3 Encounters:  03/01/21 203 lb 8 oz (92.3 kg)  02/04/21 205 lb 6 oz (93.2 kg)  01/18/21 222 lb (100.7 kg)       ASSESSMENT AND PLAN:  Problem List Items Addressed This Visit       Cardiology Problems   A-fib [facility]) - Primary    Paroxysmal atrial fibrillation Maintaining normal sinus rhythm Beta-blocker previously held for bradycardia He reports prescription ran out for the Eliquis, this has been renewed  Essential hypertension Blood pressure elevated on today's visit, recommend they buy blood pressure cuff as he is unable to ambulate and get checked at a pharmacy We will stop the spironolactone and potassium He stopped Lasix when prescription ran out Appears relatively euvolemic We will start losartan 100 mg daily Recommend follow-up BMP with primary care  Debility Walking with a cane, recommended walking program, Wife who presents with him reports he is very inactive   Total encounter time more than 25 minutes  Greater than 50% was spent in counseling and coordination of care with the patient    Signed, Esmond Plants, M.D., Ph.D. [place], [place]

## 2021-03-01 NOTE — Patient Instructions (Addendum)
Medication Instructions:  Please RESTART eliquis 5 mg twice a day Please START  losartan 100 mg daily  Please STOP Spironolactone lasix/furosemide potassium  If you need a refill on your cardiac medications before your next appointment, please call your pharmacy.   Lab work: No new labs needed  Testing/Procedures: No new testing needed  Follow-Up: At [facility], you and your health needs are our priority.  As part of our continuing mission to provide you with exceptional heart care, we have created designated Provider Care Teams.  These Care Teams include your primary Cardiologist (physician) and Advanced Practice Providers (APPs -  Physician Assistants and Nurse Practitioners) who all work together to provide you with the care you need, when you need it.  You will need a follow up appointment in 6 months, APP  Providers on your designated Care Team:   Murray Hodgkins, NP Christell Faith, PA-C Cadence Kathlen Mody, [place]  COVID-19 Vaccine Information can be found at: [email or web address] For questions related to vaccine distribution or appointments, please email vaccine@ [email or web address] or call [phone].

## 2021-04-03 ENCOUNTER — Emergency Department: Payer: Medicare Other

## 2021-04-03 ENCOUNTER — Other Ambulatory Visit: Payer: Self-pay

## 2021-04-03 ENCOUNTER — Observation Stay
Admission: EM | Admit: 2021-04-03 | Discharge: 2021-04-07 | Disposition: A | Discharge status: Skilled Nursing Facility | Payer: Medicare Other | Attending: Internal Medicine | Admitting: Internal Medicine

## 2021-04-03 ENCOUNTER — Encounter: Payer: Self-pay | Admitting: Pharmacy Technician

## 2021-04-03 DIAGNOSIS — Z96643 Presence of artificial hip joint, bilateral: Secondary | ICD-10-CM | POA: Insufficient documentation

## 2021-04-03 DIAGNOSIS — I119 Hypertensive heart disease without heart failure: Secondary | ICD-10-CM | POA: Diagnosis not present

## 2021-04-03 DIAGNOSIS — R2689 Other abnormalities of gait and mobility: Secondary | ICD-10-CM | POA: Diagnosis not present

## 2021-04-03 DIAGNOSIS — R778 Other specified abnormalities of plasma proteins: Secondary | ICD-10-CM | POA: Insufficient documentation

## 2021-04-03 DIAGNOSIS — I5030 Unspecified diastolic (congestive) heart failure: Secondary | ICD-10-CM | POA: Insufficient documentation

## 2021-04-03 DIAGNOSIS — Z7901 Long term (current) use of anticoagulants: Secondary | ICD-10-CM | POA: Insufficient documentation

## 2021-04-03 DIAGNOSIS — R2681 Unsteadiness on feet: Secondary | ICD-10-CM

## 2021-04-03 DIAGNOSIS — I6782 Cerebral ischemia: Secondary | ICD-10-CM | POA: Insufficient documentation

## 2021-04-03 DIAGNOSIS — Z79899 Other long term (current) drug therapy: Secondary | ICD-10-CM | POA: Insufficient documentation

## 2021-04-03 DIAGNOSIS — Z20822 Contact with and (suspected) exposure to covid-19: Secondary | ICD-10-CM | POA: Diagnosis not present

## 2021-04-03 DIAGNOSIS — I4891 Unspecified atrial fibrillation: Secondary | ICD-10-CM | POA: Diagnosis not present

## 2021-04-03 DIAGNOSIS — R296 Repeated falls: Secondary | ICD-10-CM | POA: Diagnosis present

## 2021-04-03 DIAGNOSIS — M25512 Pain in left shoulder: Secondary | ICD-10-CM

## 2021-04-03 DIAGNOSIS — I10 Essential (primary) hypertension: Secondary | ICD-10-CM

## 2021-04-03 LAB — CBC WITH DIFFERENTIAL/PLATELET
Abs Immature Granulocytes: 0.05 10*3/uL (ref 0.00–0.07)
Basophils Absolute: 0 10*3/uL (ref 0.0–0.1)
Basophils Relative: 0 %
Eosinophils Absolute: 0.1 10*3/uL (ref 0.0–0.5)
Eosinophils Relative: 1 %
HCT: 45.4 % (ref 39.0–52.0)
Hemoglobin: 15 g/dL (ref 13.0–17.0)
Immature Granulocytes: 0 %
Lymphocytes Relative: 7 %
Lymphs Abs: 0.9 10*3/uL (ref 0.7–4.0)
MCH: 31 pg (ref 26.0–34.0)
MCHC: 33 g/dL (ref 30.0–36.0)
MCV: 93.8 fL (ref 80.0–100.0)
Monocytes Absolute: 1.1 10*3/uL — ABNORMAL HIGH (ref 0.1–1.0)
Monocytes Relative: 9 %
Neutro Abs: 9.6 10*3/uL — ABNORMAL HIGH (ref 1.7–7.7)
Neutrophils Relative %: 83 %
Platelets: 221 10*3/uL (ref 150–400)
RBC: 4.84 MIL/uL (ref 4.22–5.81)
RDW: 12.7 % (ref 11.5–15.5)
WBC: 11.6 10*3/uL — ABNORMAL HIGH (ref 4.0–10.5)
nRBC: 0 % (ref 0.0–0.2)

## 2021-04-03 LAB — COMPREHENSIVE METABOLIC PANEL
ALT: 10 U/L (ref 0–44)
AST: 18 U/L (ref 15–41)
Albumin: 3.3 g/dL — ABNORMAL LOW (ref 3.5–5.0)
Alkaline Phosphatase: 52 U/L (ref 38–126)
Anion gap: 8 (ref 5–15)
BUN: 17 mg/dL (ref 8–23)
CO2: 25 mmol/L (ref 22–32)
Calcium: 9 mg/dL (ref 8.9–10.3)
Chloride: 102 mmol/L (ref 98–111)
Creatinine, Ser: 0.85 mg/dL (ref 0.61–1.24)
GFR, Estimated: >60 mL/min (ref >60)
Glucose, Bld: 130 mg/dL — ABNORMAL HIGH (ref 70–99)
Potassium: 3.6 mmol/L (ref 3.5–5.1)
Sodium: 135 mmol/L (ref 135–145)
Total Bilirubin: 0.9 mg/dL (ref 0.3–1.2)
Total Protein: 6.6 g/dL (ref 6.5–8.1)

## 2021-04-03 LAB — LACTIC ACID, PLASMA
Lactic Acid, Venous: 2 mmol/L (ref 0.5–1.9)
Lactic Acid, Venous: 1.8 mmol/L (ref 0.5–1.9)

## 2021-04-03 LAB — MAGNESIUM: Magnesium: 1.7 mg/dL (ref 1.7–2.4)

## 2021-04-03 LAB — PROTIME-INR
INR: 1.1 (ref 0.8–1.2)
Prothrombin Time: 14.5 seconds (ref 11.4–15.2)

## 2021-04-03 LAB — TROPONIN I (HIGH SENSITIVITY): Troponin I (High Sensitivity): 42 ng/L — ABNORMAL HIGH (ref <18)

## 2021-04-03 IMAGING — CT CT CERVICAL SPINE W/O CM
3 of 4 series · 13 of 33 positions shown, 16 images · non-contrast
Comparison: None.

CLINICAL DATA: Neck trauma (Age >= 65y).  Multiple falls.

EXAM:
CT CERVICAL SPINE WITHOUT CONTRAST
TECHNIQUE: Multidetector CT imaging of the cervical spine was performed without
intravenous contrast. Multiplanar CT image reconstructions were also
generated.

[Series 4: sagittal bone · sagittal · 0.47mm/px · 5 of 83 slices shown, 6 images]
[im 28/83  bone]
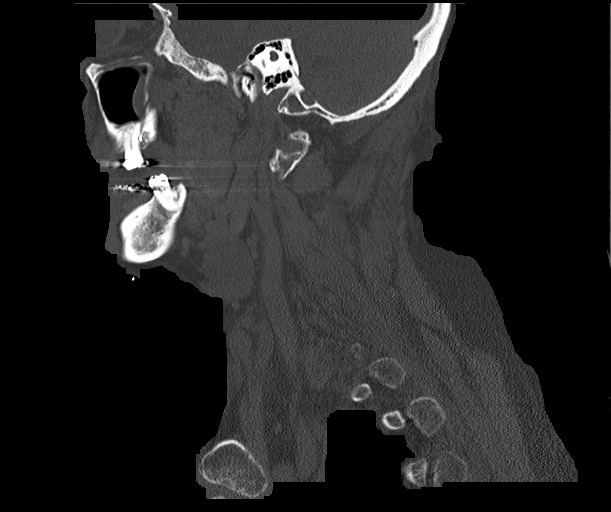
[im 35/83  bone]
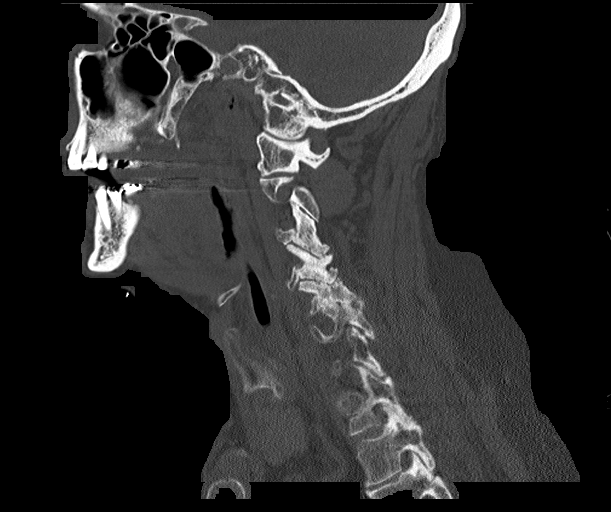
[im 42/83  soft-tissue]
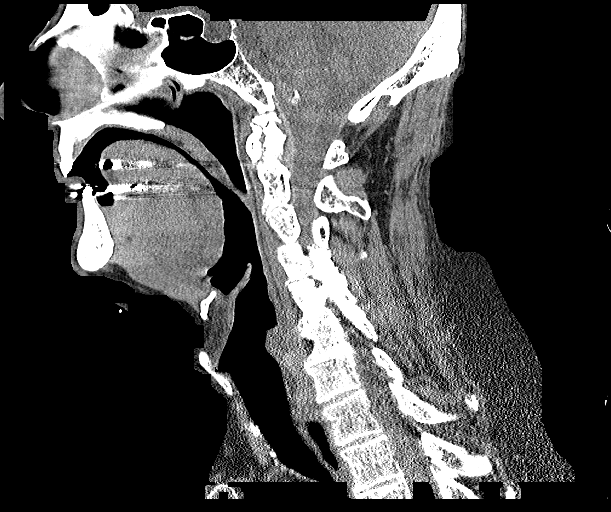
[im 42/83  bone]
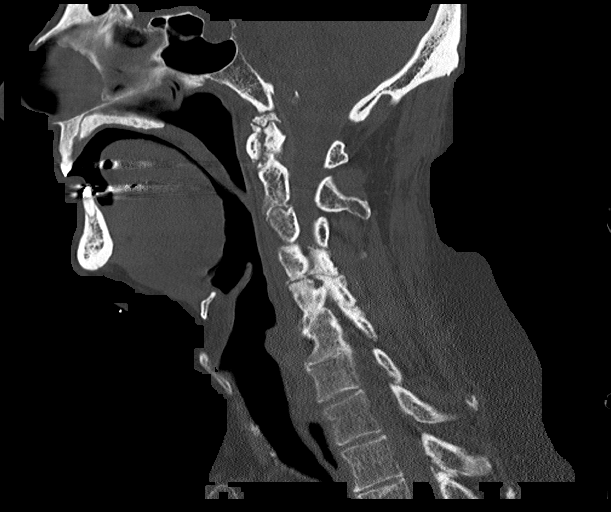
[im 48/83  bone]
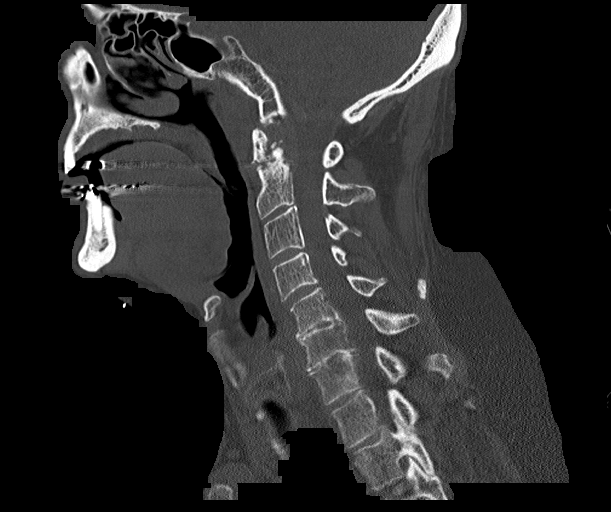
[im 55/83  bone]
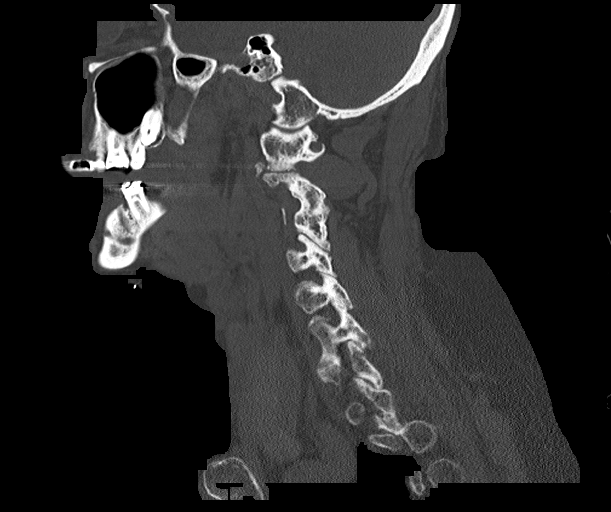

[Series 5: coronal bone · coronal · 0.40mm/px · 3 of 89 slices shown]
[im 24/89  bone]
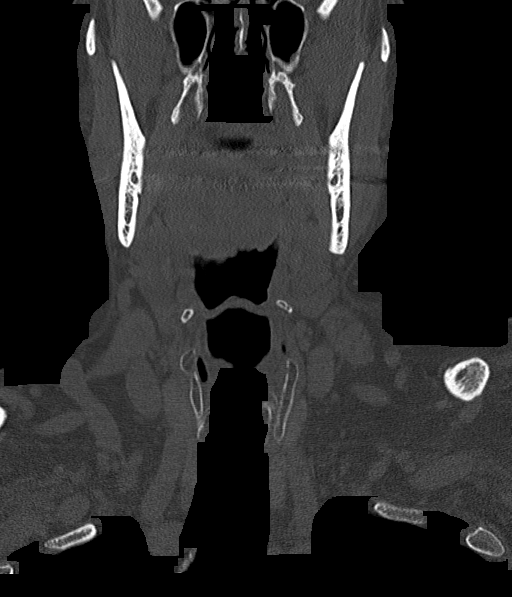
[im 38/89  bone]
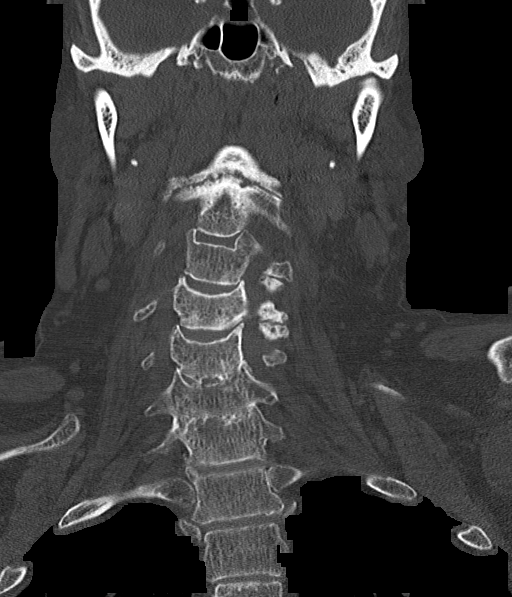
[im 51/89  bone]
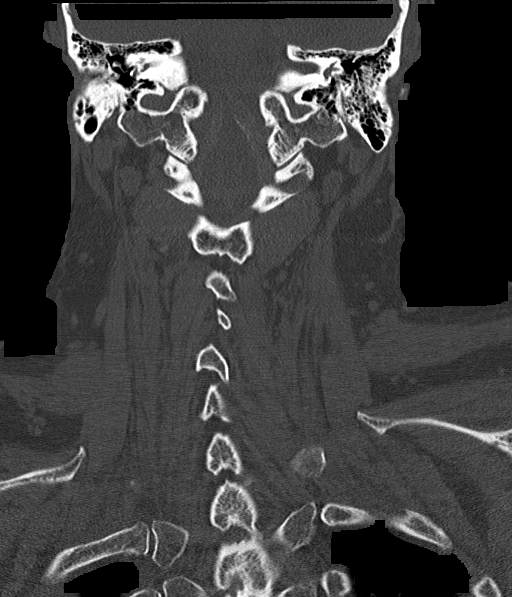

[Series 6: orthogonal bone · axial · 0.31mm/px · z∈[-362,-206]mm · 5 of 130 slices shown, 7 images]
[im 22/130  soft-tissue]
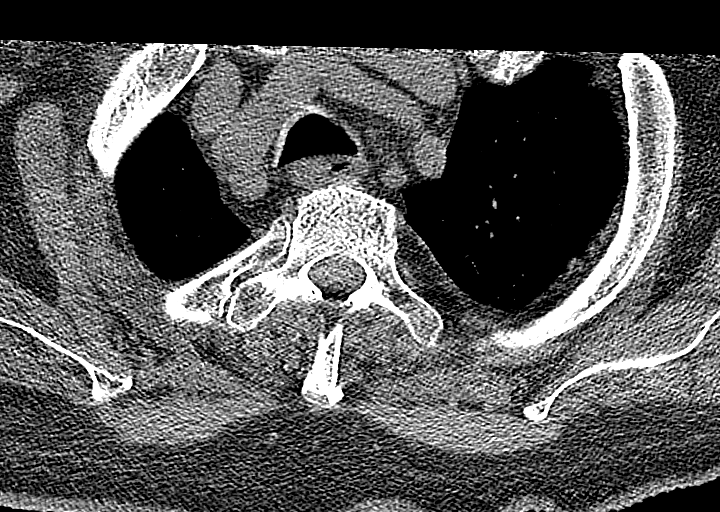
[im 22/130  bone]
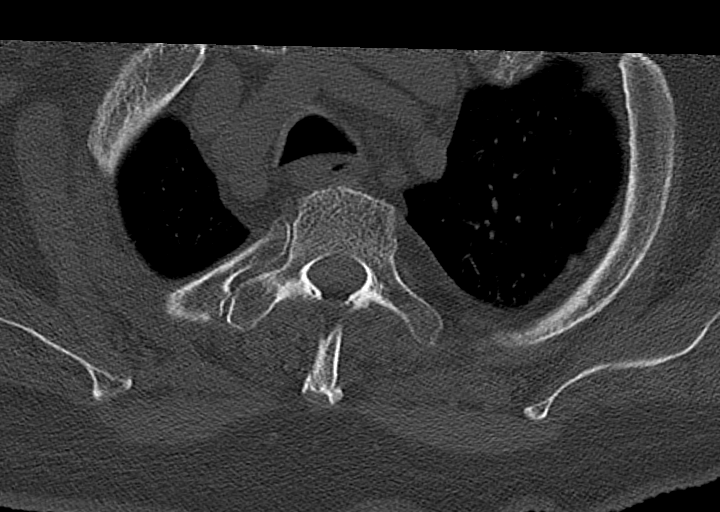
[im 44/130  bone]
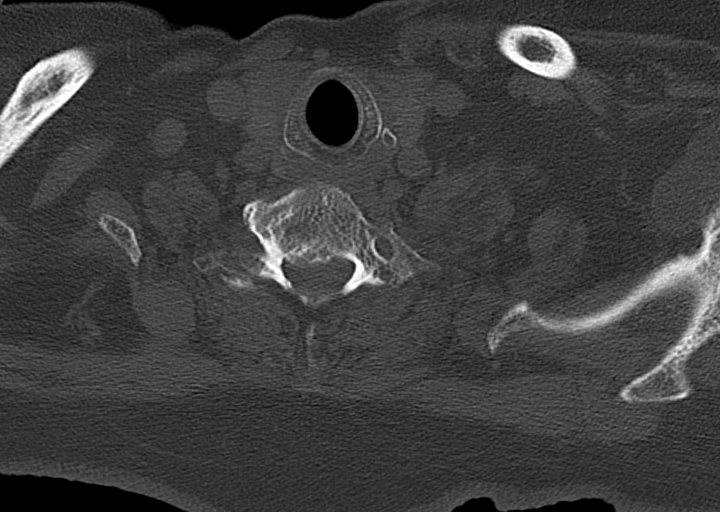
[im 65/130  bone]
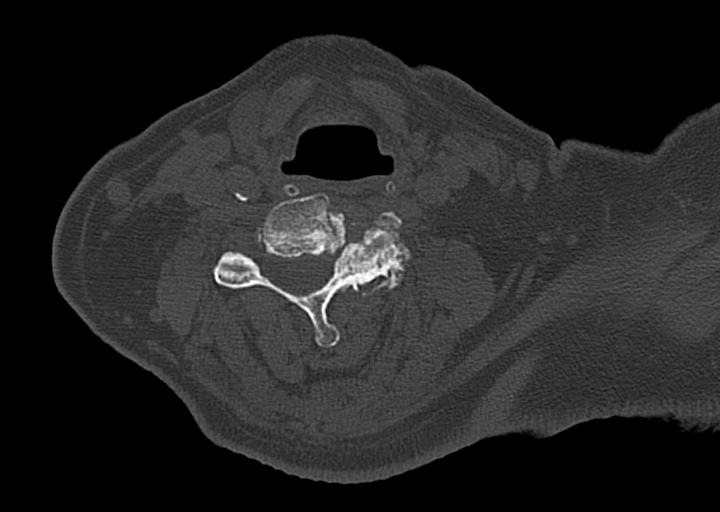
[im 87/130  bone]
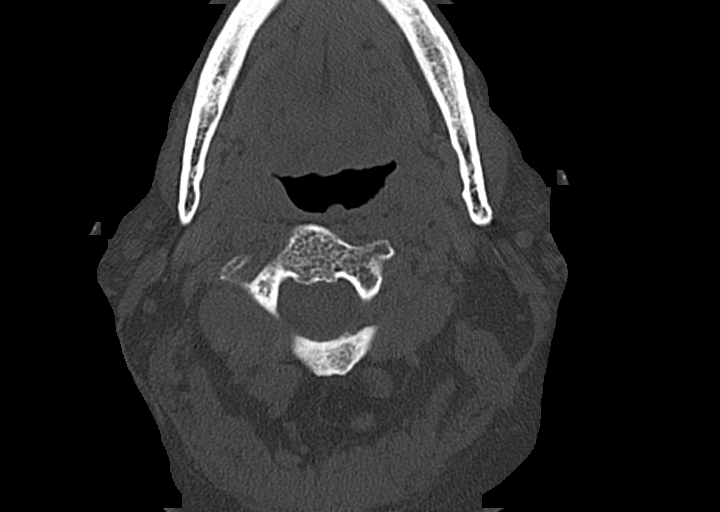
[im 108/130  soft-tissue]
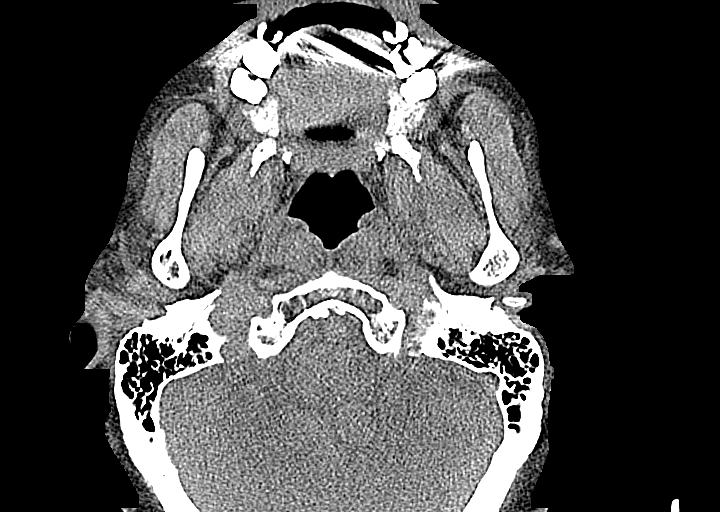
[im 108/130  bone]
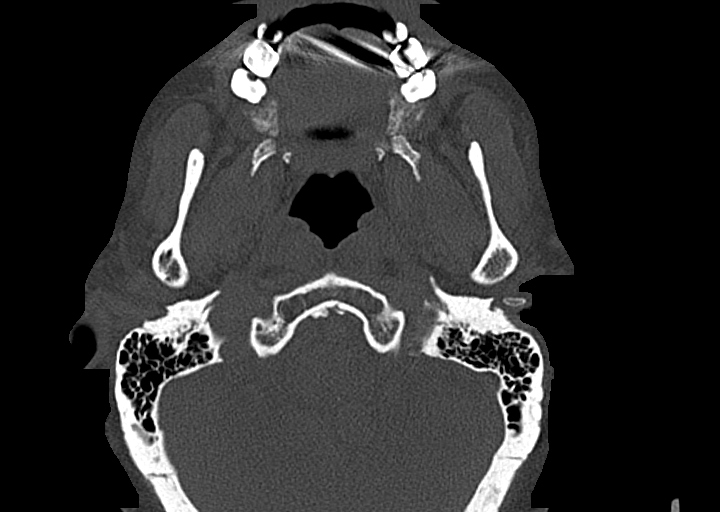

[13 of 33 positions shown; findings below may reference images not displayed]

FINDINGS: Alignment: No subluxation.

Skull base and vertebrae: No acute fracture. No primary bone lesion
or focal pathologic process.

Soft tissues and spinal canal: No prevertebral fluid or swelling. No
visible canal hematoma.

Disc levels: Advanced arthritic changes at the C1-2 articulation.
Advanced degenerative changes at C5-6 and C6-7. Moderate to advanced
bilateral diffuse degenerative facet disease, left greater than
right.

Upper chest: No acute findings

Other: None
IMPRESSION: Advanced degenerative changes.  No acute bony abnormality.

## 2021-04-03 IMAGING — CT CT HEAD W/O CM
4 series · 17 of 47 positions shown, 19 images · non-contrast
Comparison: [DATE]

CLINICAL DATA: Multiple falls, altered mental status. Dizziness,
nonspecific

EXAM:
CT HEAD WITHOUT CONTRAST
TECHNIQUE: Contiguous axial images were obtained from the base of the skull
through the vertex without intravenous contrast.

[Series 2: head wo · axial · 0.44mm/px · z∈[-143,-18]mm · 7 of 35 slices shown, 9 images]
[im 5/35  brain]
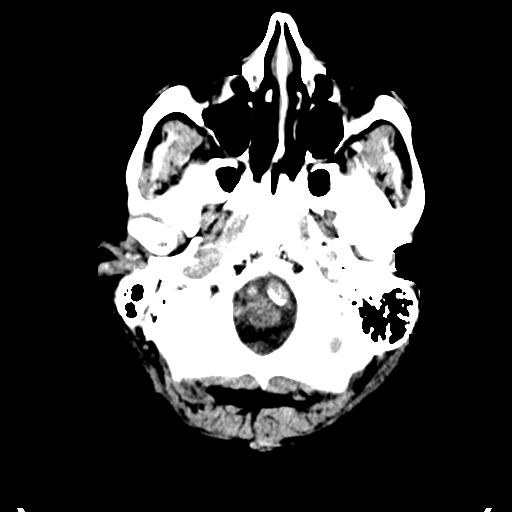
[im 5/35  bone]
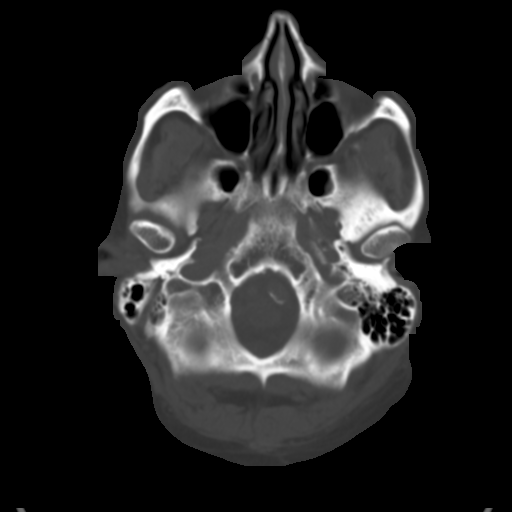
[im 9/35  brain]
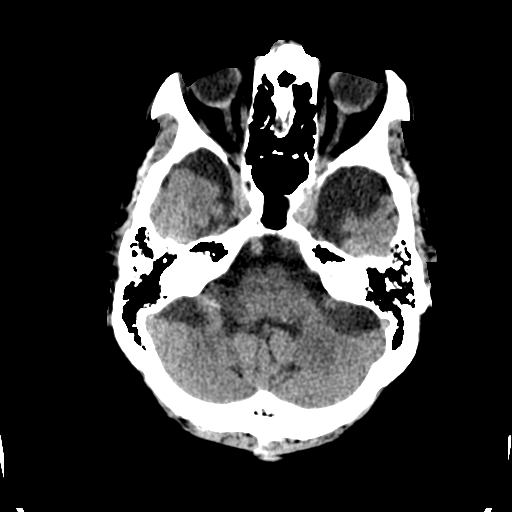
[im 13/35  brain]
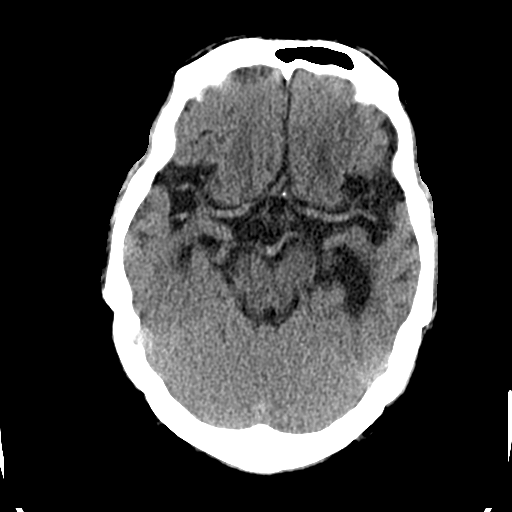
[im 18/35  brain]
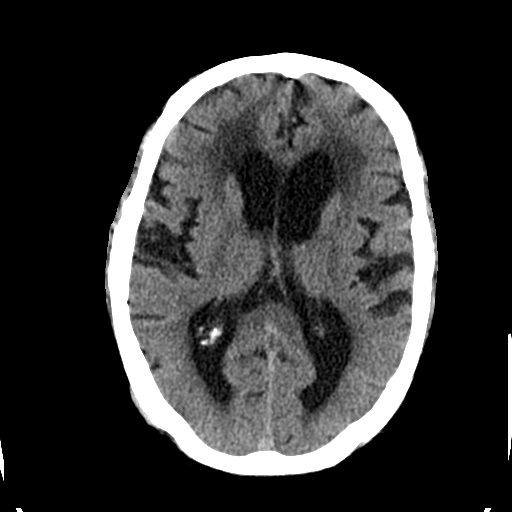
[im 22/35  brain]
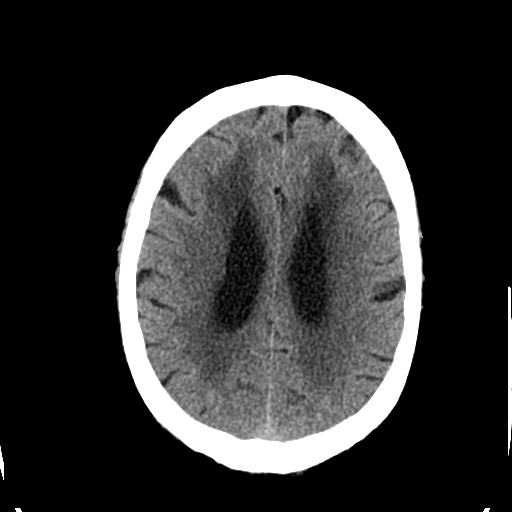
[im 22/35  bone]
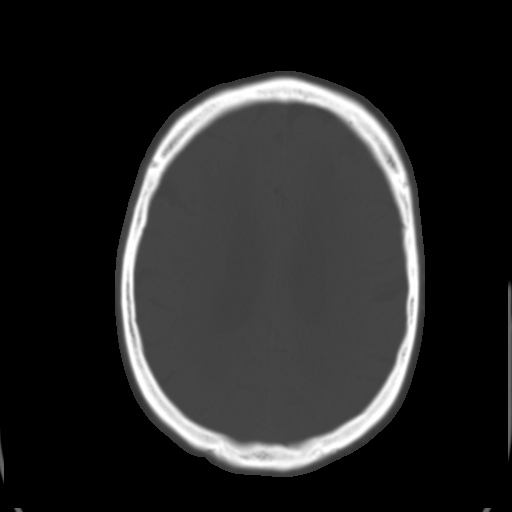
[im 26/35  brain]
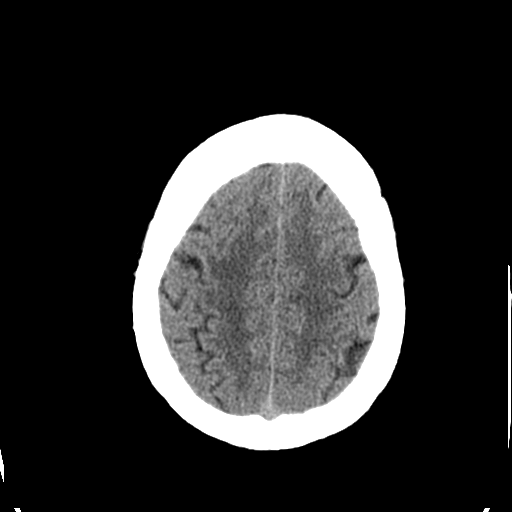
[im 30/35  brain]
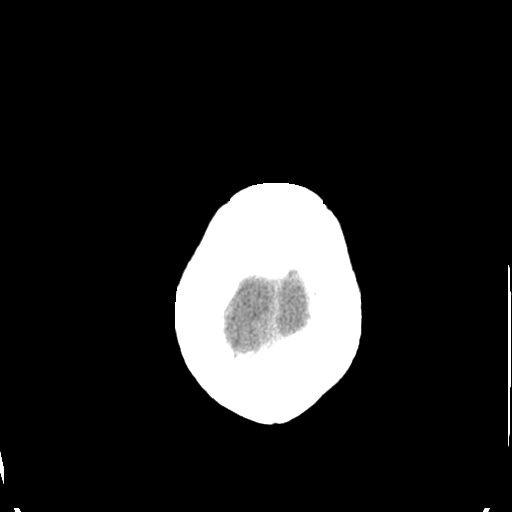

[Series 3: head bone · axial · 0.44mm/px · z∈[-147,-87]mm · 4 of 86 slices shown]
[im 9/86  bone]
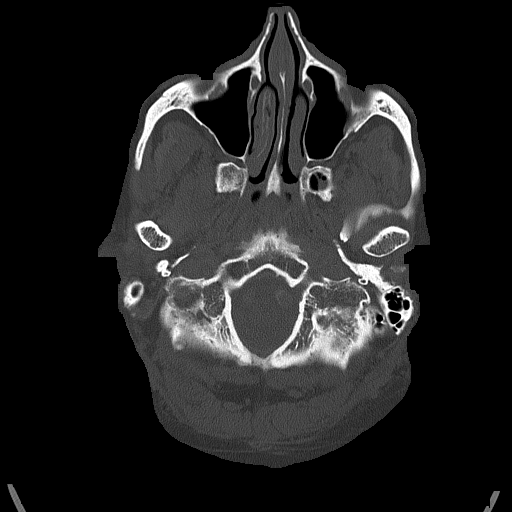
[im 18/86  bone]
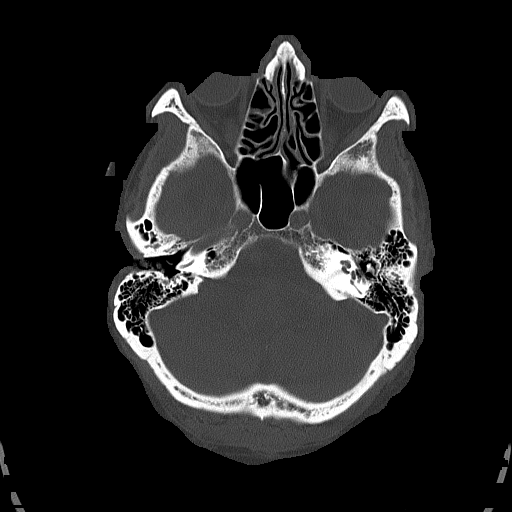
[im 26/86  bone]
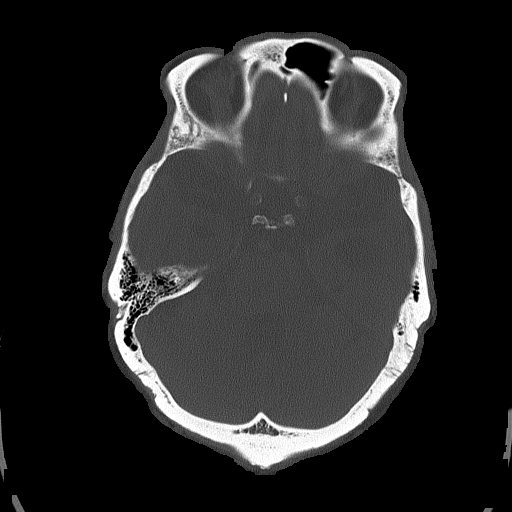
[im 39/86  bone]
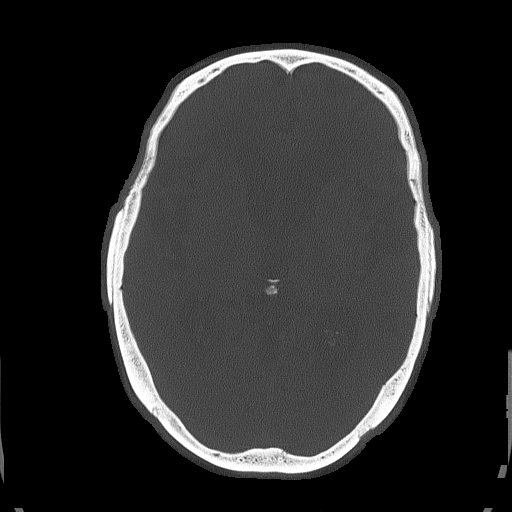

[Series 4: coronal soft tissue · coronal · 0.37mm/px · 3 of 75 slices shown]
[im 25/75  brain]
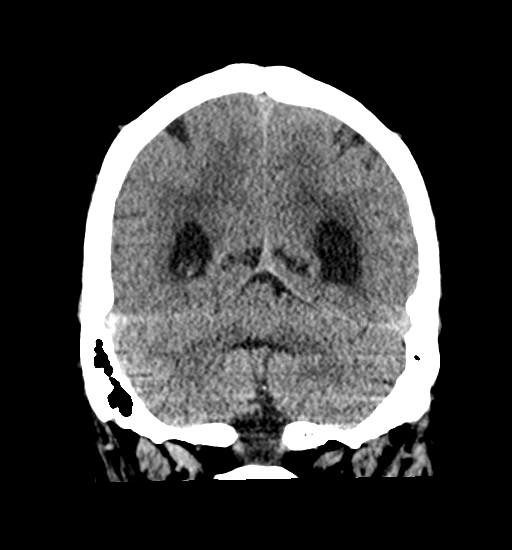
[im 33/75  brain]
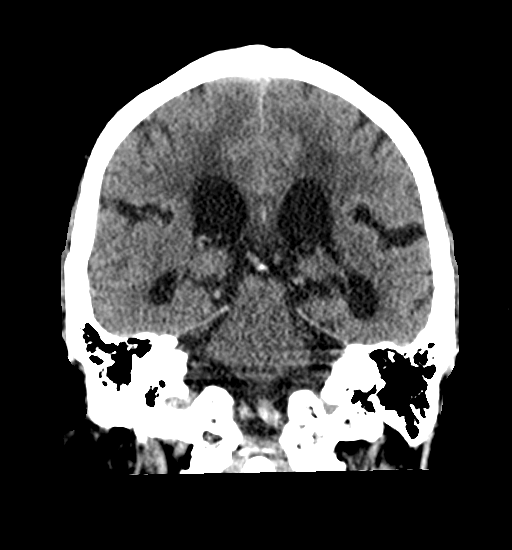
[im 42/75  brain]
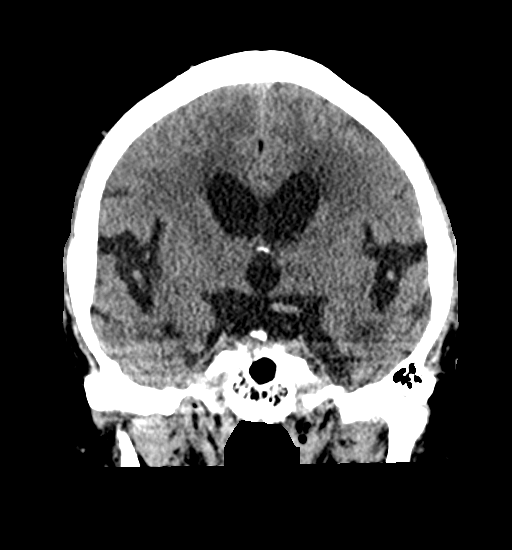

[Series 5: sagittal soft tissue · sagittal · 0.37mm/px · 3 of 61 slices shown]
[im 21/61  brain]
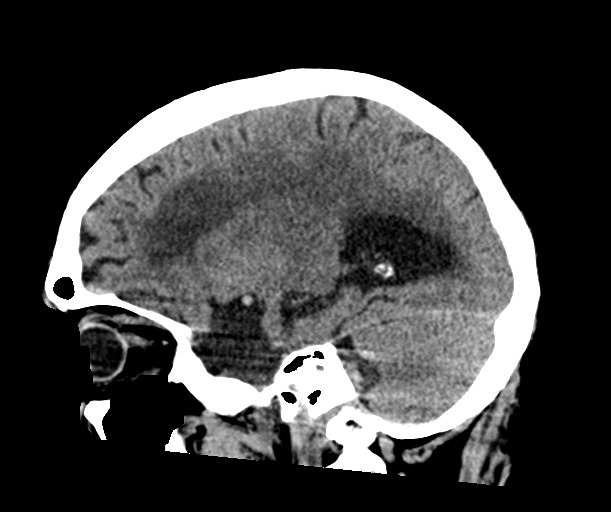
[im 31/61  brain]
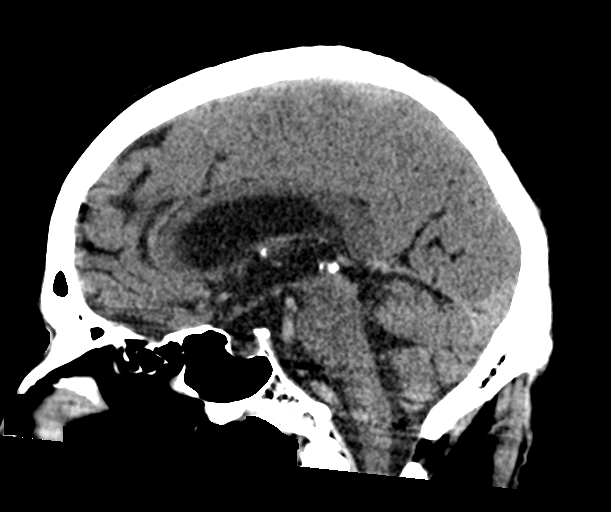
[im 41/61  brain]
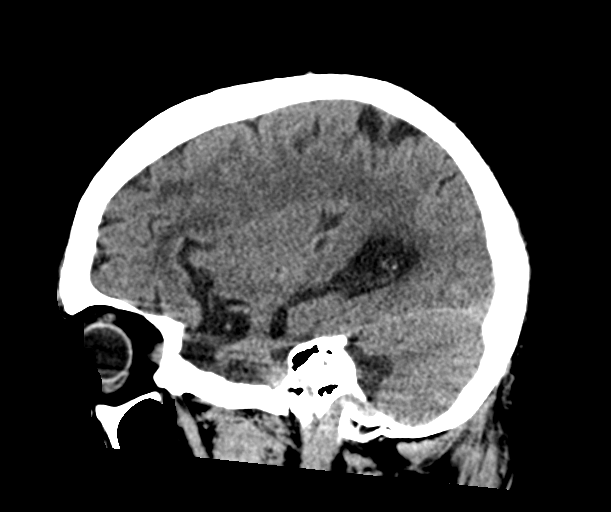

[17 of 47 positions shown; findings below may reference images not displayed]

FINDINGS: Brain: There is atrophy and chronic small vessel disease changes. No
acute intracranial abnormality. Specifically, no hemorrhage,
hydrocephalus, mass lesion, acute infarction, or significant
intracranial injury.

Vascular: No hyperdense vessel or unexpected calcification.

Skull: No acute calvarial abnormality.

Sinuses/Orbits: No acute findings

Other: None
IMPRESSION: Atrophy, chronic microvascular disease.

No acute intracranial abnormality.

## 2021-04-03 IMAGING — CR DG CHEST 2V
2 series · 2 of 2 positions shown · non-contrast
Comparison: [DATE]

CLINICAL DATA: Weakness, multiple falls

EXAM:
CHEST - 2 VIEW

[chest lat]
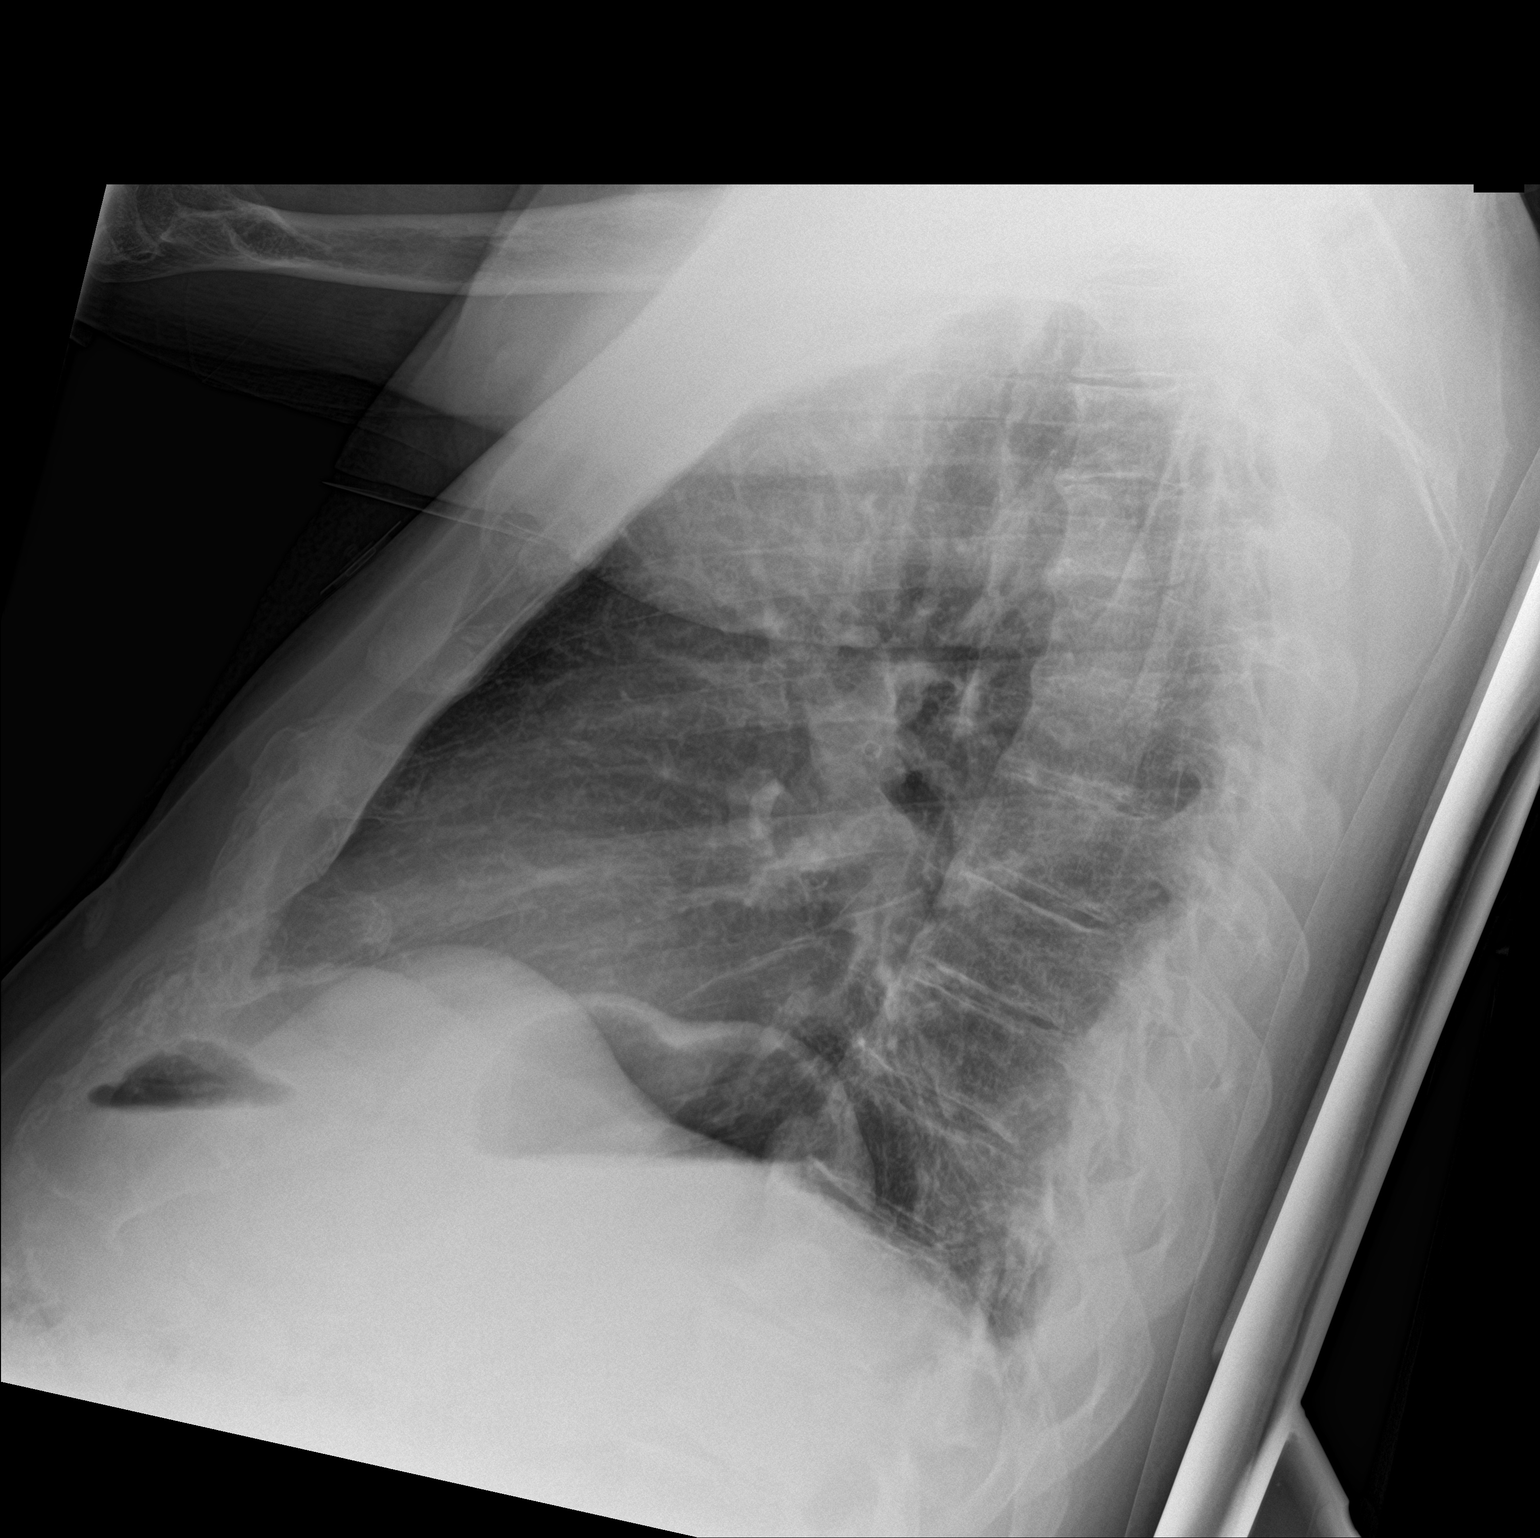

[chest ap]
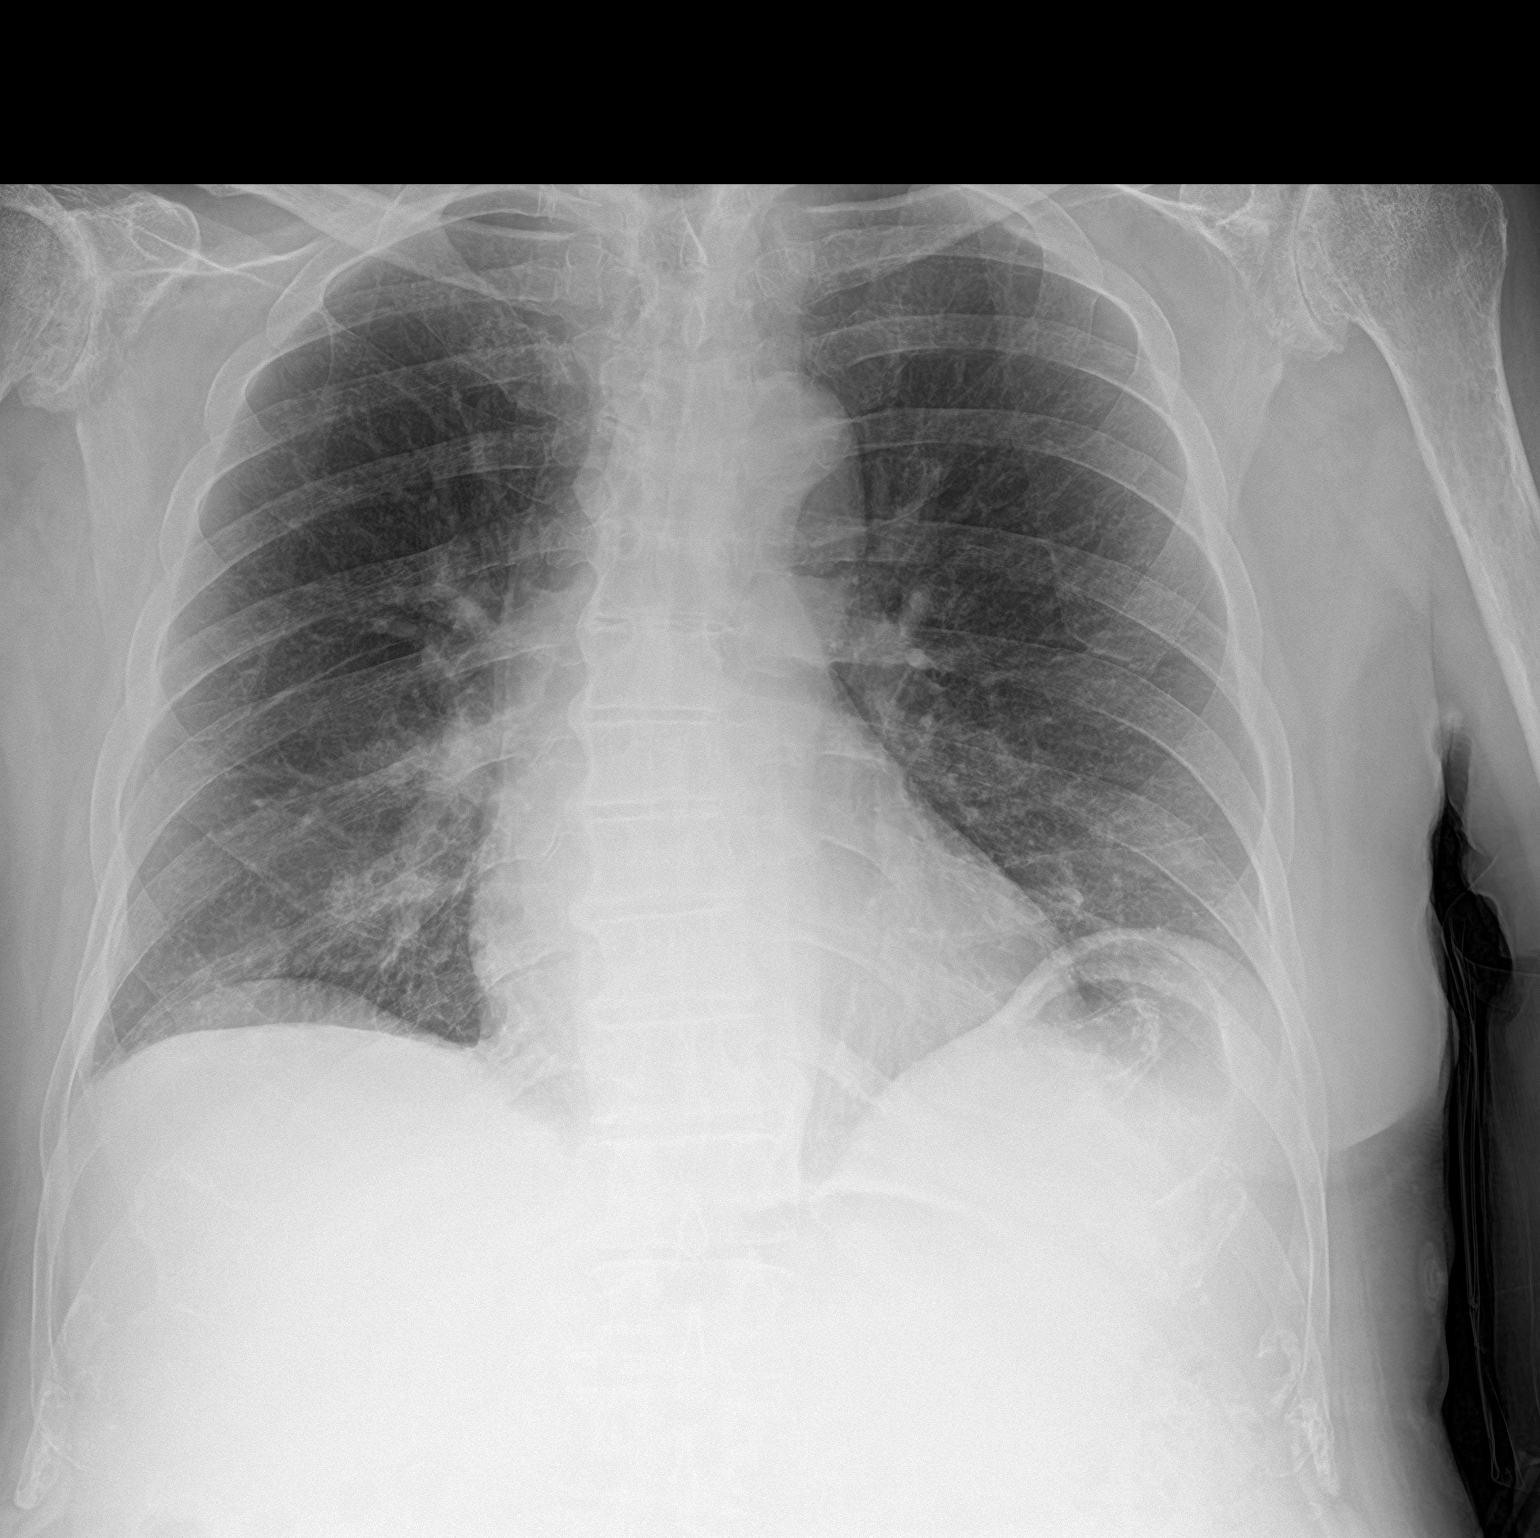

[2 of 2 positions shown; findings below may reference images not displayed]

FINDINGS: Heart is normal size. No confluent airspace opacities or effusions.
No acute bony abnormality.
IMPRESSION: No active cardiopulmonary disease.

## 2021-04-03 MED ORDER — LACTATED RINGERS IV BOLUS
1000.0000 mL | Freq: Once | INTRAVENOUS | Status: AC
  Administered 2021-04-04: 1000 mL via INTRAVENOUS

## 2021-04-03 NOTE — ED Triage Notes (Addendum)
Pt bib ems from home with multiple falls. Pt unable to keep his balance X5 days. Stroke scale negative with ems. VSS with EMS. Pt complains of bilateral arm and shoulder pain. Pt did not hit his head or lose consciousness. NIH 0.

## 2021-04-03 NOTE — ED Provider Notes (Addendum)
[facility] Provider Note    Event Date/Time   First MD Initiated Contact with Patient 04/03/21 2307     (approximate)   History   Fall   HPI  Steven Welch is a 81 y.o. male whose medical history includes chronic atrial fibrillation on Eliquis and whose cardiologist is Dr. Rockey Situ.  He presents by EMS for evaluation after multiple falls.  The patient states, and confirmed by paramedics, that he has felt very unsteady for the last 2 days.  He said he feels like "my balance just left me."  The symptoms were noticeable about 2 days ago and then seem to get a little bit better, but then it came back yesterday and he had at least 4 or 5 gentle falls where he could not maintain his balance and was able to lower himself to the ground.  He did not strike his head and he reports no headache.  He has some muscle soreness in his neck and his shoulders but has no pain with flexion or extension or rotation of his head and neck side to side.  He did not lose consciousness.  He denies having any chest pain, shortness of breath, abdominal pain, or vomiting.  No visual changes.  He denies any history of stroke or heart attack.  Nothing in particular seems to make his symptoms better or worse.  He denies being on any new medications.  He states that he has been eating and drinking okay.  He denies drug and alcohol use.  He has not had a fever, nasal congestion, nor sore throat recently.     Physical Exam   Triage Vital Signs: ED Triage Vitals  Enc Vitals Group     BP 04/03/21 1856 129/72     Pulse Rate 04/03/21 1856 (!) 101     Resp 04/03/21 1856 14     Temp 04/03/21 1856 97.6 F (36.4 C)     Temp src --      SpO2 04/03/21 1856 95 %     Weight --      Height --      Head Circumference --      Peak Flow --      Pain Score 04/03/21 1849 4     Pain Loc --      Pain Edu? --      Excl. in [place]? --     Most recent vital signs: Vitals:   04/03/21 1856 04/04/21 0003  BP:  129/72 111/71  Pulse: (!) 101 88  Resp: 14 16  Temp: 97.6 F (36.4 C) 97.6 F (36.4 C)  SpO2: 95% 98%    General: Awake, no distress.  CV:  Good peripheral perfusion.  Resp:  Normal effort.  Abd:  No distention.  No tenderness to palpation throughout the abdomen. Neuro:  The patient has no facial droop.  No dysarthria nor aphasia.  He has good grip strength bilaterally.  He has essentially normal major muscle groups strength in upper and lower extremities and has an NIH stroke scale of 0.  However he does seem to have to exert more effort to raise and move around his arms then he anticipated he would.  He has no dysmetria but it seems to require a great deal of effort for him to use his right dominant arm to perform finger-to-nose testing, but he is able to do so.  He has no nystagmus and normal extraocular movement.   ED Results / Procedures /  Treatments   Labs (all labs ordered are listed, but only abnormal results are displayed) Labs Reviewed  COMPREHENSIVE METABOLIC PANEL - Abnormal; Notable for the following components:      Result Value   Glucose, Bld 130 (*)    Albumin 3.3 (*)    All other components within normal limits  CBC WITH DIFFERENTIAL/PLATELET - Abnormal; Notable for the following components:   WBC 11.6 (*)    Neutro Abs 9.6 (*)    Monocytes Absolute 1.1 (*)    All other components within normal limits  LACTIC ACID, PLASMA - Abnormal; Notable for the following components:   Lactic Acid, Venous 2.0 (*)    All other components within normal limits  TROPONIN I (HIGH SENSITIVITY) - Abnormal; Notable for the following components:   Troponin I (High Sensitivity) 42 (*)    All other components within normal limits  TROPONIN I (HIGH SENSITIVITY) - Abnormal; Notable for the following components:   Troponin I (High Sensitivity) 61 (*)    All other components within normal limits  LACTIC ACID, PLASMA  PROTIME-INR  MAGNESIUM  URINALYSIS, ROUTINE W REFLEX MICROSCOPIC      EKG  ED ECG REPORT I, Hinda Kehr, the attending physician, personally viewed and interpreted this ECG.  Date: 04/03/2021 EKG Time: 19: 02 Rate: 114 Rhythm: Atrial fibrillation with RVR QRS Axis: normal Intervals: Abnormal due to A. fib otherwise unremarkable ST/T Wave abnormalities: Non-specific ST segment / T-wave changes, but no clear evidence of acute ischemia. Narrative Interpretation: no definitive evidence of acute ischemia; does not meet STEMI criteria.    RADIOLOGY I personally reviewed the following images: CT head, CT cervical spine, chest x-ray .  I do not perceive any obvious or acute abnormalities such as areas of intracranial hemorrhage, fracture or subluxation of the C-spine acute abnormality on chest x-ray such as pneumonia, pneumothorax, or pulmonary edema.  The radiology reports concur and identified no acute or emergent abnormalities.    PROCEDURES:  Critical Care performed: No  .1-3 Lead EKG Interpretation Performed by: Hinda Kehr, MD Authorized by: Hinda Kehr, MD     Interpretation: abnormal     ECG rate:  102   ECG rate assessment: tachycardic     Rhythm: atrial fibrillation     Ectopy: none     Conduction: normal     MEDICATIONS ORDERED IN ED: Medications  lactated ringers bolus 1,000 mL (1,000 mLs Intravenous New Bag/Given 04/04/21 0024)     IMPRESSION / MDM / ASSESSMENT AND PLAN / ED COURSE  I reviewed the triage vital signs and the nursing notes.                              Differential diagnosis includes, but is not limited to, CVA, TIA, nonspecific neurological decline (acute on chronic), acute traumatic injury, atypical ACS presentation.  The patient is on the cardiac monitor to evaluate for evidence of arrhythmia and/or significant heart rate changes.  Initial work-up ordered includes EKG, troponin, coagulation studies, magnesium, CMP, CBC, and lactic acid.  I personally reviewed the results of the labs.  CBC is  essentially normal other than a very slight cytosis of 11.6 which is unlikely to be clinically relevant.  CMP is within normal limits.  Unremarkable coags.  Magnesium level is normal.  Lactic acid is slightly elevated 2.0 but I suspect this will correct with a fluid bolus of LR 1 L IV which I ordered.  I  do not believe this is representative of sepsis or acute infection and we will recheck it after fluids.  However the patient's high-sensitivity troponin is 42, and looking back in prior records, I do not see prior elevation.  He has not having chest pain and I do not believe this represents ACS, but it could represent demand ischemia.  His EKG is nonischemic and shows his anticipated atrial fibrillation but his rate is slightly elevated which again I anticipate will correct with IV fluids.  Of greater concern is his report of loss of balance.  His neurological exam is generally reassuring and if he has had a CVA he is well outside the window for tPA given that he has had symptoms for about 2 days.  As described above, I personally reviewed his imaging including his head CT, cervical spine CT, and chest x-ray, and there were no acute abnormalities identified on any of the studies.  However given his neurological deficits and falls as well as a slightly elevated troponin which could be attributable to a CVA, I will obtain an MR brain and MRA head to look for CVA as well as signs of posterior circulation obstruction.  Anticipate the need for observation/admission in the hospital given his symptoms and elevated troponin but he is on Eliquis already and I do not believe he would benefit from starting heparin at this time.  I discussed the plan with the patient and he agrees.  Clinical Course as of 04/04/21 0143  Sun Apr 04, 2021  0002 Lactic Acid, Venous: 1.8 [CF]  0041 Troponin I (High Sensitivity)(!): 61 [CF]  0041 Troponin I (High Sensitivity)(!): 61 High-sensitivity troponin has gone up to 61 from 42.   Unclear clinical significance given no chest pain.  Awaiting MRIs. [CF]  0118 Given that the patient is on Eliquis and having no chest pain, I will not start heparin at this time in spite of the slightly rising troponin level.  I will also hold off on antiplatelet agents such as aspirin or Plavix.  I am consulting the hospitalist for admission for his frequent falls, loss of balance, and elevated troponin level.  MRIs are pending. [CF]  0142 I personally reviewed the results of the MRI brain and MRA head.  I do not see any acute abnormalities, and this was confirmed by the radiology interpretation.  I consulted Dr. Damita Dunnings with the hospitalist service for admission and she will admit the patient for further evaluation and treatment. [CF]    Clinical Course User Index [CF] Hinda Kehr, MD     FINAL CLINICAL IMPRESSION(S) / ED DIAGNOSES   Final diagnoses:  Elevated troponin level  Frequent falls  Loss of balance     Rx / DC Orders   ED Discharge Orders          Ordered    levofloxacin (LEVAQUIN) 750 MG tablet  Daily        04/04/21 0126    benzonatate (TESSALON PERLES) 100 MG capsule  3 times daily PRN        04/04/21 0126             Note:  This document was prepared using Dragon voice recognition software and may include unintentional dictation errors.   Hinda Kehr, MD 04/04/21 Ovid Curd    Hinda Kehr, MD 04/04/21 [id number]    Hinda Kehr, MD 04/04/21 [id number]

## 2021-04-03 NOTE — ED Notes (Signed)
Critical lactic acid of 2.0 called from lab. Dr. Quentin Cornwall notified, no new orders received.

## 2021-04-03 NOTE — ED Provider Triage Note (Signed)
Emergency Medicine Provider Triage Evaluation Note  Steven Welch , a 81 y.o. male  was evaluated in triage.  Pt complains of weakness, multiple falls.  Patient presented to the emergency department via EMS after sustaining multiple falls today.  Patient is feeling generally weak.  Denied any headache, chest pain, GI symptoms prior to the falls.  Patient initially declined EMS transport the first 2 times they went out to the system, the third time patient was brought to the emergency department for evaluation.  Patient had a recent admission 2 months ago for sepsis with similar presentation of generalized weakness and multiple falls.  No recent sick contacts according to the patient..  Review of Systems  Positive: Multiple falls, generalized weakness Negative: Headache, fever, musculoskeletal pain, emesis, diarrhea, urinary changes  Physical Exam  BP 129/72    Pulse (!) 101    Temp 97.6 F (36.4 C)    Resp 14    SpO2 95%  Gen:   Awake, no distress   Resp:  Normal effort MSK:   Moves extremities without difficulty  Other:  Neuro exam grossly intact at this time  Medical Decision Making  Medically screening exam initiated at 6:58 PM.  Appropriate orders placed.  [name] was informed that the remainder of the evaluation will be completed by another provider, this initial triage assessment does not replace that evaluation, and the importance of remaining in the ED until their evaluation is complete.  Patient presents emergency department with generalized weakness, multiple falls.  Patient initially declined EMS transport first 2x0 brought out, was brought to the ED for evaluation on the third call out for fall.  Patient largely denies other complaints other than weakness.  He was recently admitted 2 months ago for similar symptoms and was diagnosed with sepsis.  At this time patient will have labs, chest x-ray, CT scans, urinalysis.   [name], PA-C 04/03/21 1858

## 2021-04-04 ENCOUNTER — Emergency Department: Payer: Medicare Other

## 2021-04-04 DIAGNOSIS — R778 Other specified abnormalities of plasma proteins: Secondary | ICD-10-CM | POA: Diagnosis not present

## 2021-04-04 DIAGNOSIS — I10 Essential (primary) hypertension: Secondary | ICD-10-CM

## 2021-04-04 DIAGNOSIS — I4819 Other persistent atrial fibrillation: Secondary | ICD-10-CM

## 2021-04-04 DIAGNOSIS — R296 Repeated falls: Secondary | ICD-10-CM | POA: Diagnosis not present

## 2021-04-04 DIAGNOSIS — R2681 Unsteadiness on feet: Secondary | ICD-10-CM

## 2021-04-04 LAB — BASIC METABOLIC PANEL
Anion gap: 5 (ref 5–15)
BUN: 18 mg/dL (ref 8–23)
CO2: 29 mmol/L (ref 22–32)
Calcium: 8.3 mg/dL — ABNORMAL LOW (ref 8.9–10.3)
Chloride: 105 mmol/L (ref 98–111)
Creatinine, Ser: 0.89 mg/dL (ref 0.61–1.24)
GFR, Estimated: >60 mL/min (ref >60)
Glucose, Bld: 103 mg/dL — ABNORMAL HIGH (ref 70–99)
Potassium: 3.5 mmol/L (ref 3.5–5.1)
Sodium: 139 mmol/L (ref 135–145)

## 2021-04-04 LAB — TROPONIN I (HIGH SENSITIVITY)
Troponin I (High Sensitivity): 61 ng/L — ABNORMAL HIGH (ref <18)
Troponin I (High Sensitivity): 37 ng/L — ABNORMAL HIGH (ref <18)
Troponin I (High Sensitivity): 36 ng/L — ABNORMAL HIGH (ref <18)

## 2021-04-04 LAB — IRON AND TIBC
Iron: 26 ug/dL — ABNORMAL LOW (ref 45–182)
Saturation Ratios: 10 % — ABNORMAL LOW (ref 17.9–39.5)
TIBC: 251 ug/dL (ref 250–450)
UIBC: 225 ug/dL

## 2021-04-04 LAB — CBC
HCT: 43.2 % (ref 39.0–52.0)
Hemoglobin: 14.1 g/dL (ref 13.0–17.0)
MCH: 31.4 pg (ref 26.0–34.0)
MCHC: 32.6 g/dL (ref 30.0–36.0)
MCV: 96.2 fL (ref 80.0–100.0)
Platelets: 185 10*3/uL (ref 150–400)
RBC: 4.49 MIL/uL (ref 4.22–5.81)
RDW: 12.9 % (ref 11.5–15.5)
WBC: 9.4 10*3/uL (ref 4.0–10.5)
nRBC: 0 % (ref 0.0–0.2)

## 2021-04-04 LAB — MAGNESIUM: Magnesium: 1.8 mg/dL (ref 1.7–2.4)

## 2021-04-04 LAB — VITAMIN B12: Vitamin B-12: 190 pg/mL (ref 180–914)

## 2021-04-04 LAB — RESP PANEL BY RT-PCR (FLU A&B, COVID) ARPGX2
Influenza A by PCR: NEGATIVE
Influenza B by PCR: NEGATIVE
SARS Coronavirus 2 by RT PCR: NEGATIVE

## 2021-04-04 LAB — FOLATE: Folate: 13.5 ng/mL (ref >5.9)

## 2021-04-04 LAB — PHOSPHORUS: Phosphorus: 2.5 mg/dL (ref 2.5–4.6)

## 2021-04-04 LAB — VITAMIN D 25 HYDROXY (VIT D DEFICIENCY, FRACTURES): Vit D, 25-Hydroxy: 26.75 ng/mL — ABNORMAL LOW (ref 30–100)

## 2021-04-04 IMAGING — MR MR HEAD W/O CM
12 series · 47 of 48 positions shown · non-contrast
Comparison: No pertinent prior exam.

CLINICAL DATA: Dizziness

EXAM:
MRI HEAD WITHOUT CONTRAST
MRA HEAD WITHOUT CONTRAST
TECHNIQUE: Multiplanar, multi-echo pulse sequences of the brain and surrounding
structures were acquired without intravenous contrast. Angiographic
images of the Circle of Willis were acquired using MRA technique
without intravenous contrast.

[Series 5: ax dwi_tracew · axial · 3.0mm · 0.65mm/px · z∈[-101,+53]mm · 3 of 48 slices shown]
[im 1/48]
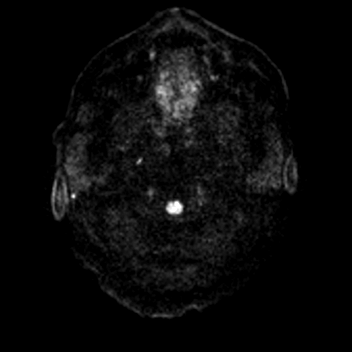
[im 24/48]
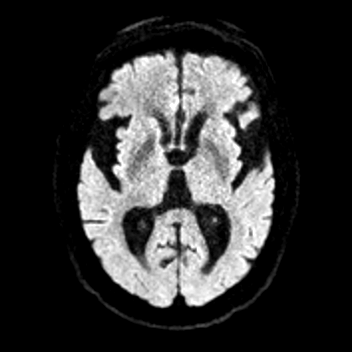
[im 48/48]
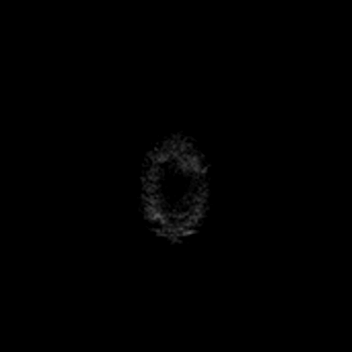

[Series 6: ax dwi_adc · axial · 3.0mm · 0.65mm/px · z∈[-101,+53]mm · 3 of 48 slices shown]
[im 1/48]
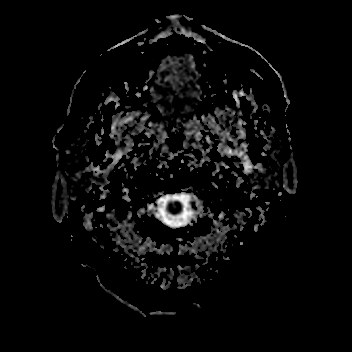
[im 24/48]
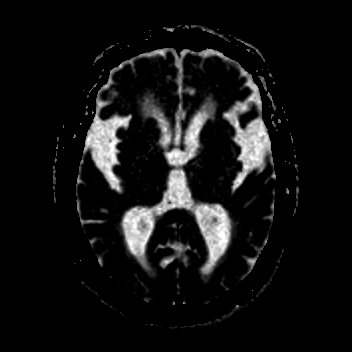
[im 48/48]
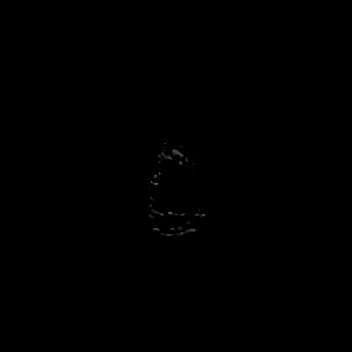

[Series 7: cor dwi_tracew · coronal · 5.0mm · 0.60mm/px · 2 of 36 slices shown]
[im 1/36]
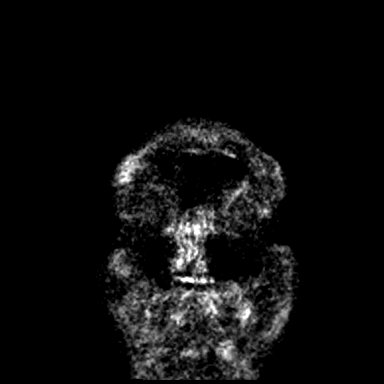
[im 36/36]
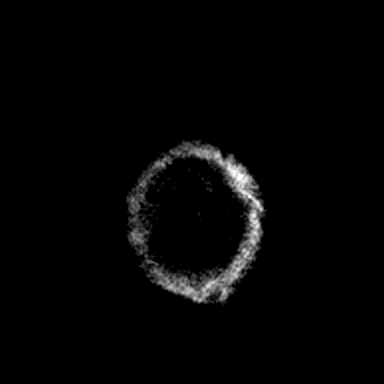

[Series 8: cor dwi_adc · coronal · 5.0mm · 0.60mm/px · 3 of 36 slices shown]
[im 1/36]
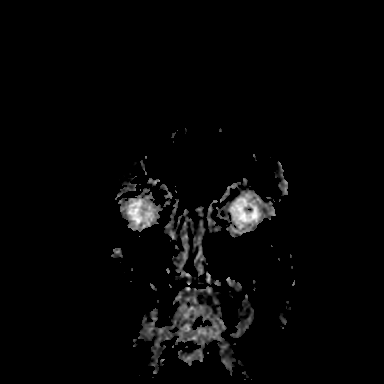
[im 18/36]
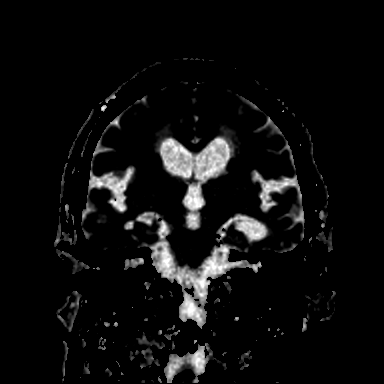
[im 36/36]
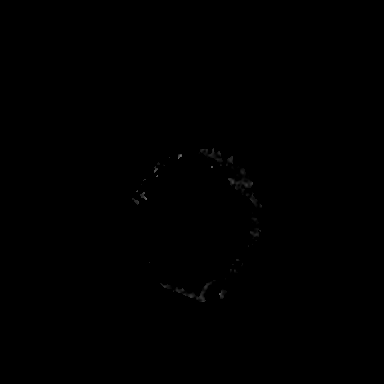

[Series 9: T1 · sagittal · 5.0mm · 0.62mm/px · 2 of 22 slices shown (1 of 2)]
[im 1/22]
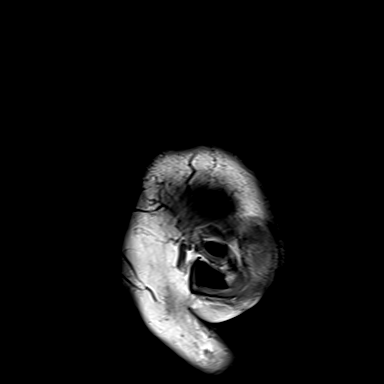
[im 22/22]
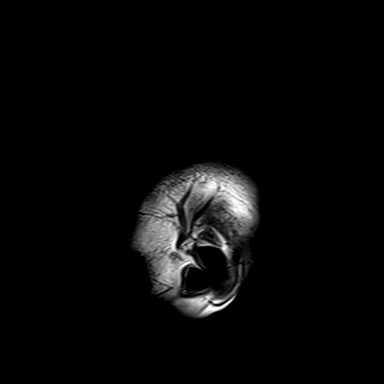

[Series 10: T2 · axial · 5.0mm · 0.53mm/px · z∈[-93,+44]mm · 2 of 24 slices shown (1 of 2)]
[im 1/24]
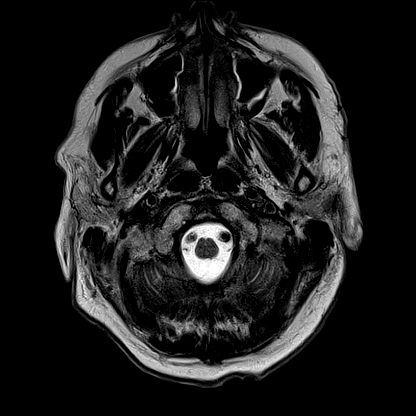
[im 24/24]
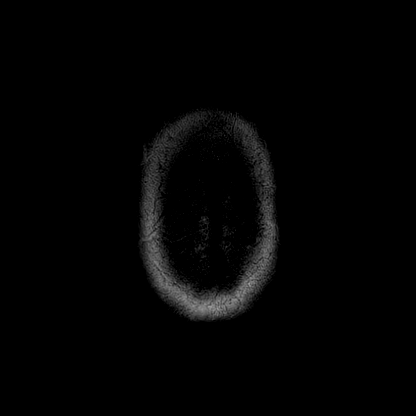

[Series 11: mag_images · axial · 3.0mm · 0.90mm/px · z∈[-113,+63]mm · 5 of 60 slices shown]
[im 1/60]
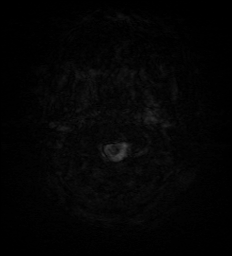
[im 15/60]
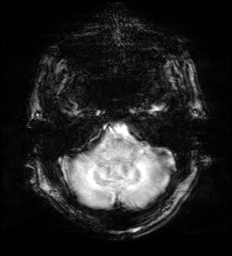
[im 30/60]
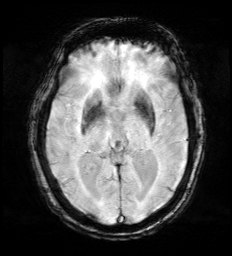
[im 45/60]
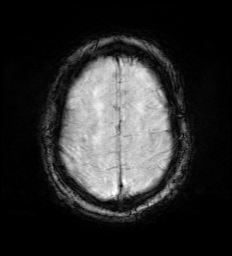
[im 60/60]
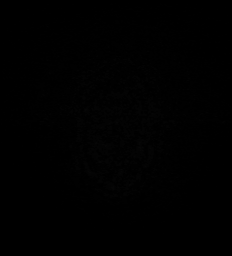

[Series 12: pha_images · axial · 3.0mm · 0.90mm/px · z∈[-113,+63]mm · 5 of 60 slices shown]
[im 1/60]
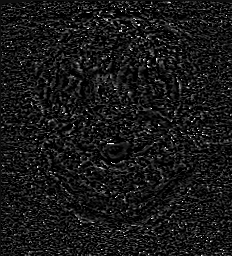
[im 15/60]
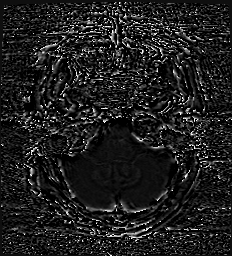
[im 30/60]
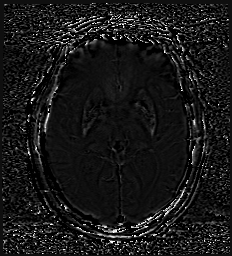
[im 45/60]
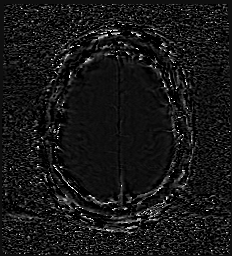
[im 60/60]
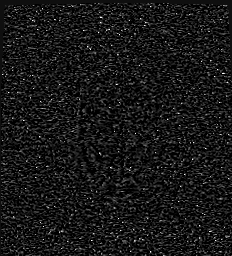

[Series 13: swi_images · axial · 3.0mm · 0.90mm/px · z∈[-113,+63]mm · 5 of 60 slices shown]
[im 1/60]
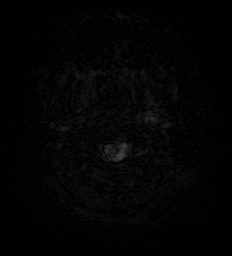
[im 15/60]
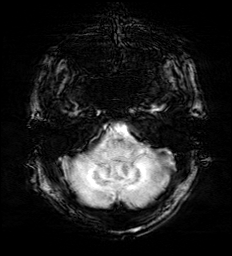
[im 30/60]
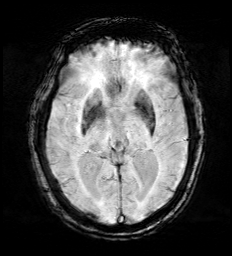
[im 45/60]
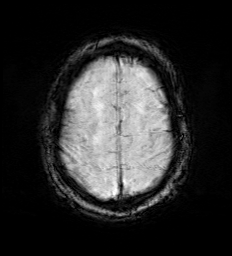
[im 60/60]
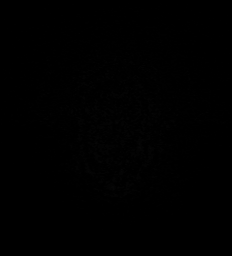

[Series 15: FLAIR · axial · 3.0mm · 0.53mm/px · z∈[-116,+67]mm · 4 of 52 slices shown]
[im 1/52]
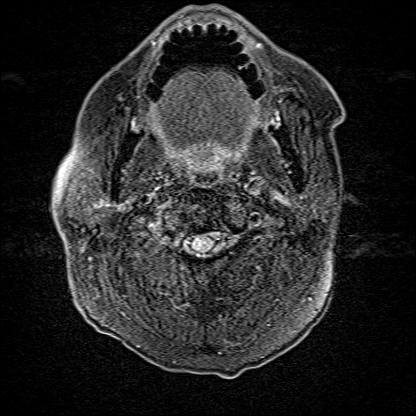
[im 18/52]
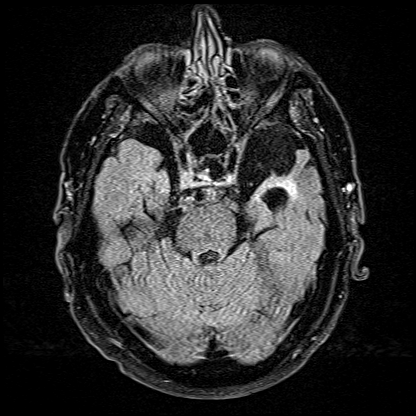
[im 35/52]
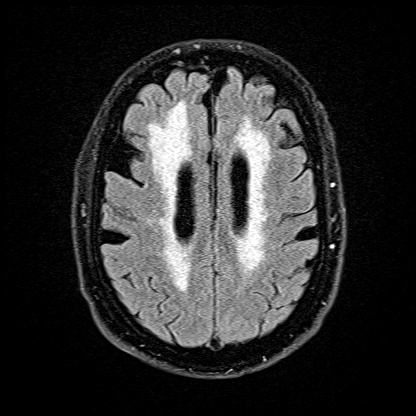
[im 52/52]
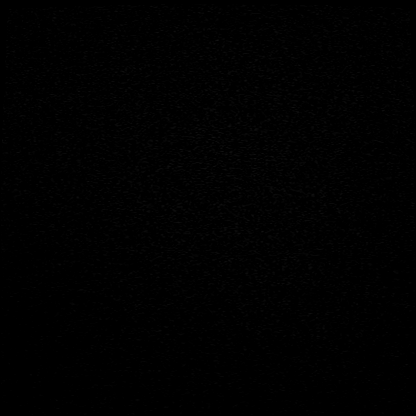

[Series 16: T1 · axial · 1.0mm · 0.98mm/px · z∈[-104,+54]mm · 11 of 160 slices shown (2 of 2)]
[im 1/160]
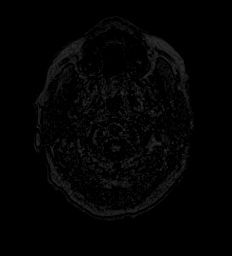
[im 15/160]
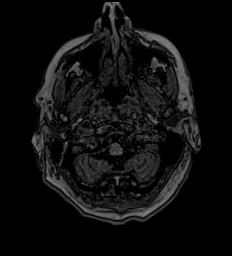
[im 29/160]
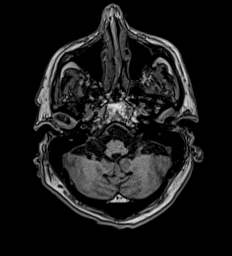
[im 44/160]
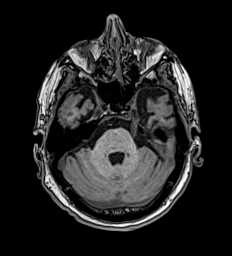
[im 58/160]
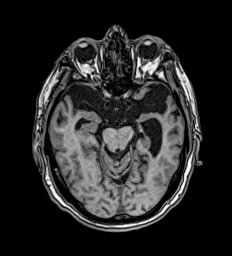
[im 73/160]
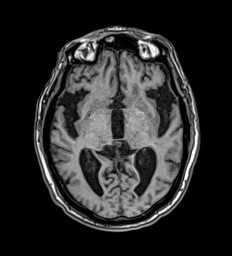
[im 87/160]
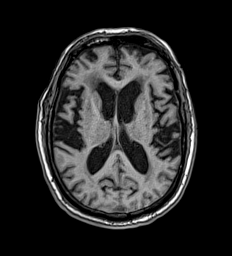
[im 102/160]
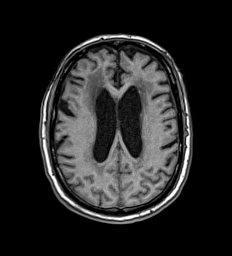
[im 116/160]
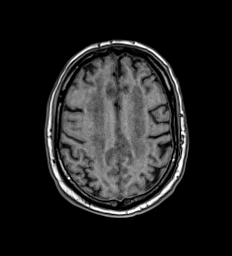
[im 131/160]
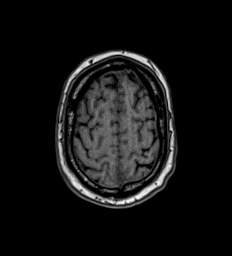
[im 160/160]
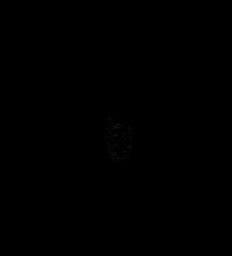

[Series 17: T2 · coronal · 5.0mm · 0.57mm/px · 2 of 28 slices shown (2 of 2)]
[im 1/28]
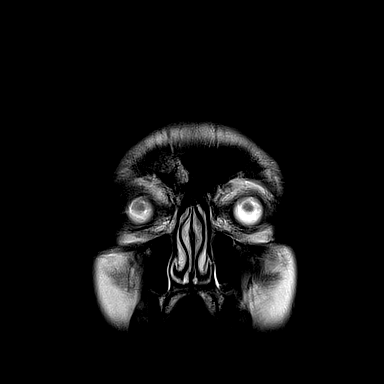
[im 28/28]
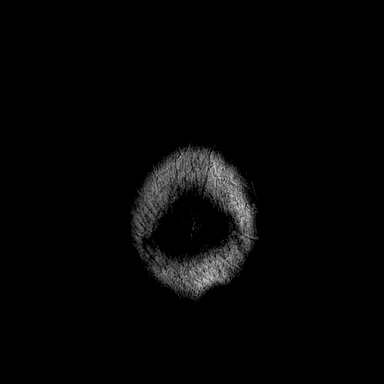

[47 of 48 positions shown; findings below may reference images not displayed]

FINDINGS: MRI HEAD FINDINGS

Brain: No acute infarct, mass effect or extra-axial collection. No
acute or chronic hemorrhage. Hyperintense T2-weighted signal is
widespread throughout the white matter. Diffuse, severe atrophy. The
midline structures are normal.

Vascular: Major flow voids are preserved.

Skull and upper cervical spine: Normal calvarium and skull base.
Visualized upper cervical spine and soft tissues are normal.

Sinuses/Orbits:No paranasal sinus fluid levels or advanced mucosal
thickening. No mastoid or middle ear effusion. Normal orbits.

MRA HEAD FINDINGS

POSTERIOR CIRCULATION:

--Vertebral arteries: Normal

--Inferior cerebellar arteries: Normal.

--Basilar artery: Normal.

--Superior cerebellar arteries: Normal.

--Posterior cerebral arteries: Normal.

ANTERIOR CIRCULATION:

--Intracranial internal carotid arteries: Normal.

--Anterior cerebral arteries (ACA): Normal.

--Middle cerebral arteries (MCA): Normal.

ANATOMIC VARIANTS: None
IMPRESSION: 1. No acute intracranial abnormality.
2. Diffuse, severe atrophy and chronic small vessel disease.
3. Normal intracranial MRA.

## 2021-04-04 IMAGING — MR MR MRA HEAD W/O CM
1 series · 19 of 48 positions shown · non-contrast
Comparison: No pertinent prior exam.

CLINICAL DATA: Dizziness

EXAM:
MRI HEAD WITHOUT CONTRAST
MRA HEAD WITHOUT CONTRAST
TECHNIQUE: Multiplanar, multi-echo pulse sequences of the brain and surrounding
structures were acquired without intravenous contrast. Angiographic
images of the Circle of Willis were acquired using MRA technique
without intravenous contrast.

[Series 5: TOF · axial · 0.5mm · 0.41mm/px · z∈[-110,+5]mm · 19 of 243 slices shown]
[im 1/243]
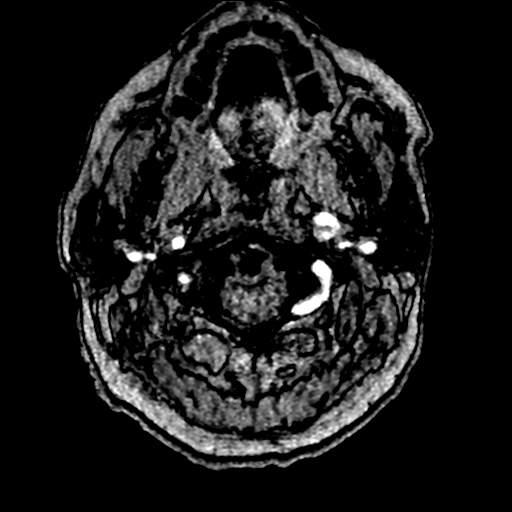
[im 6/243]
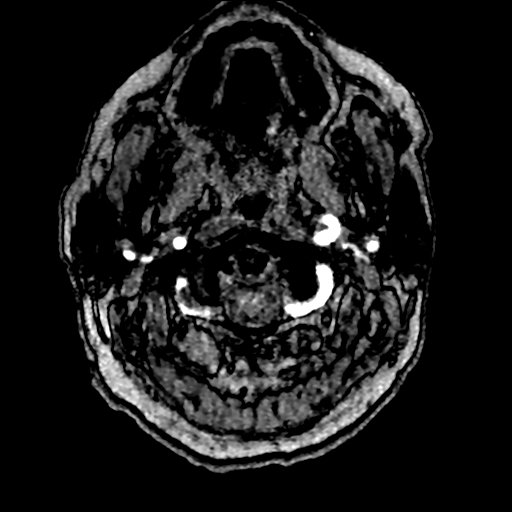
[im 11/243]
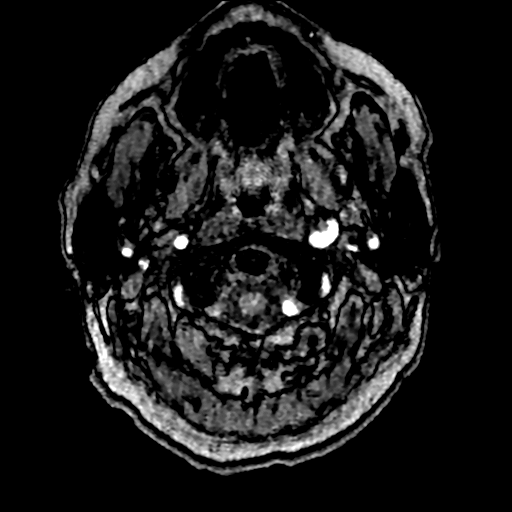
[im 16/243]
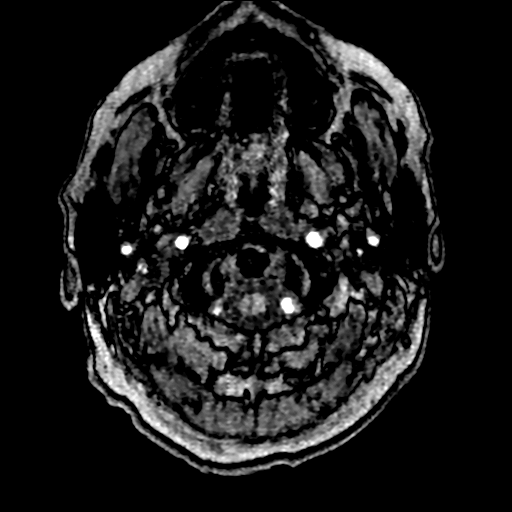
[im 21/243]
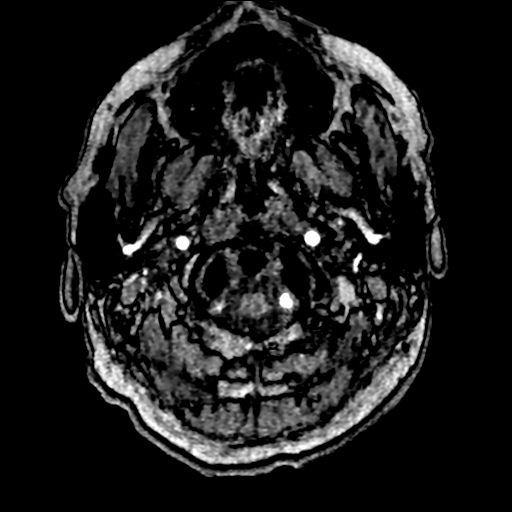
[im 26/243]
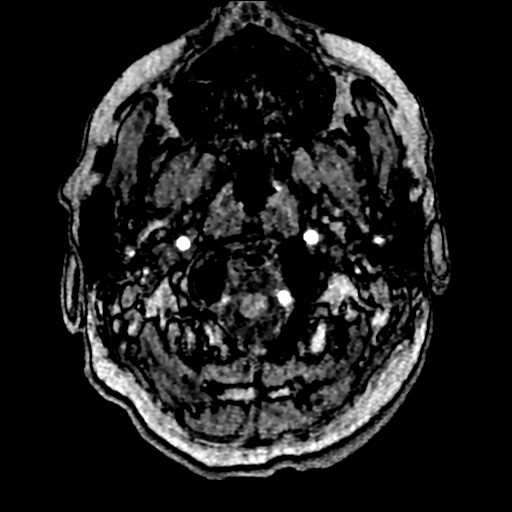
[im 31/243]
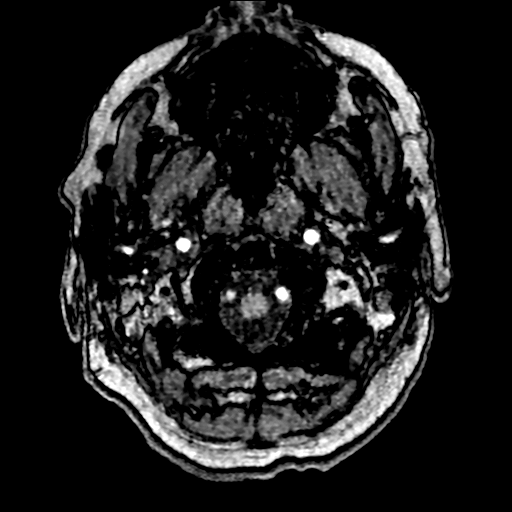
[im 37/243]
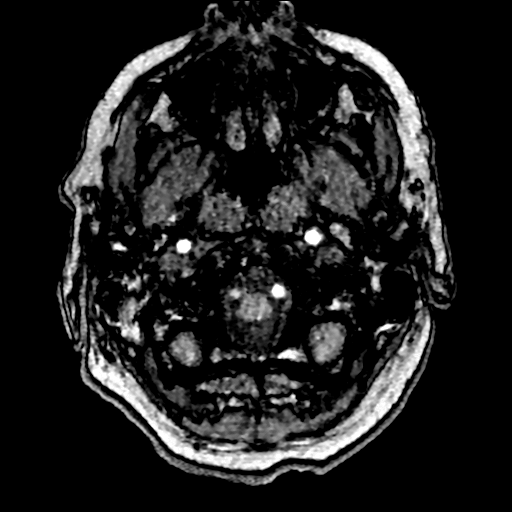
[im 42/243]
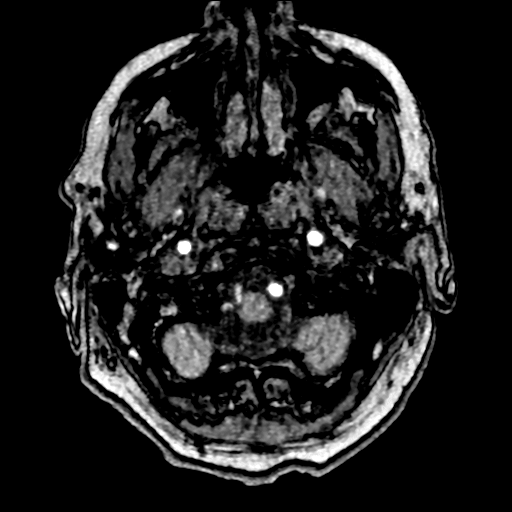
[im 47/243]
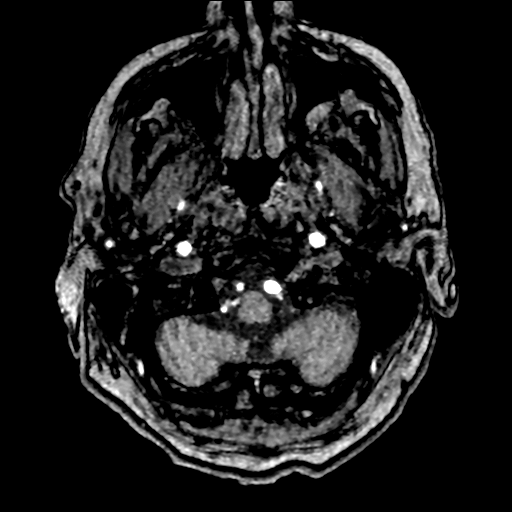
[im 52/243]
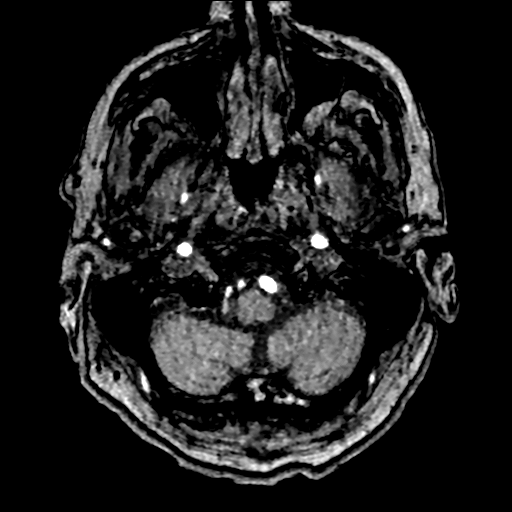
[im 78/243]
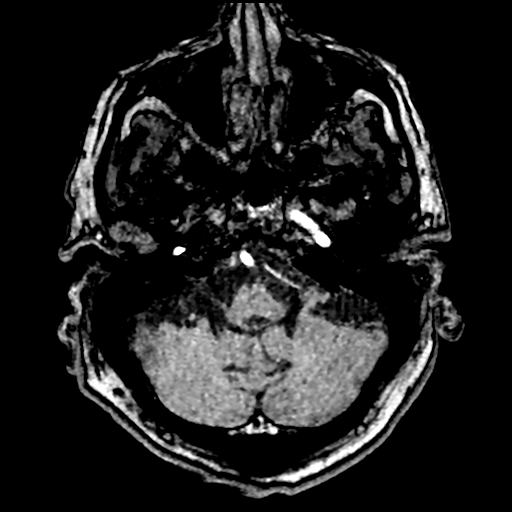
[im 109/243]
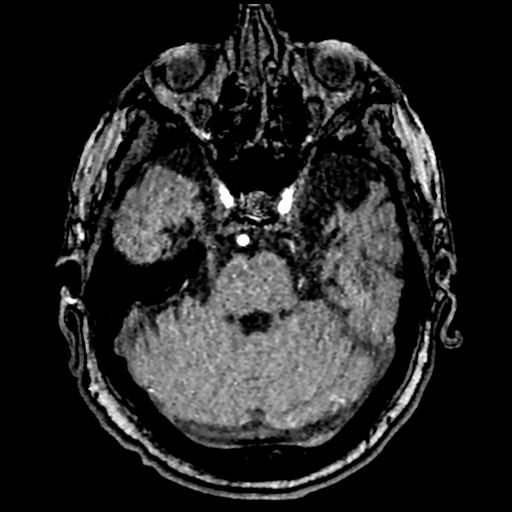
[im 124/243]
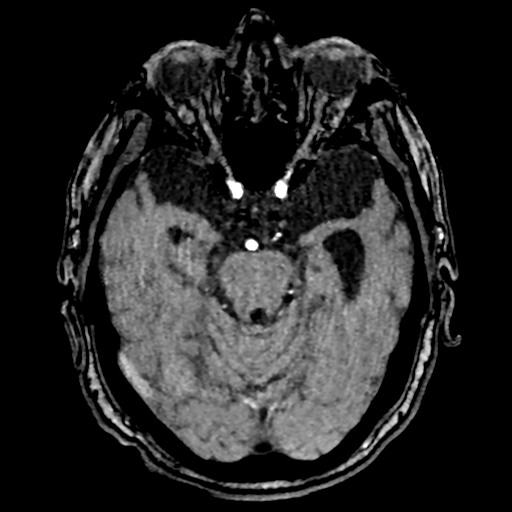
[im 140/243]
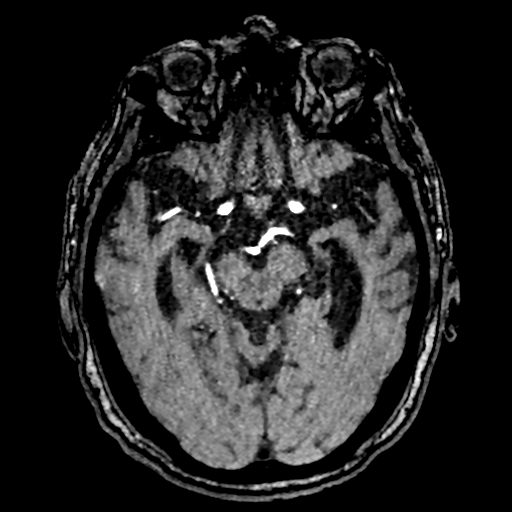
[im 170/243]
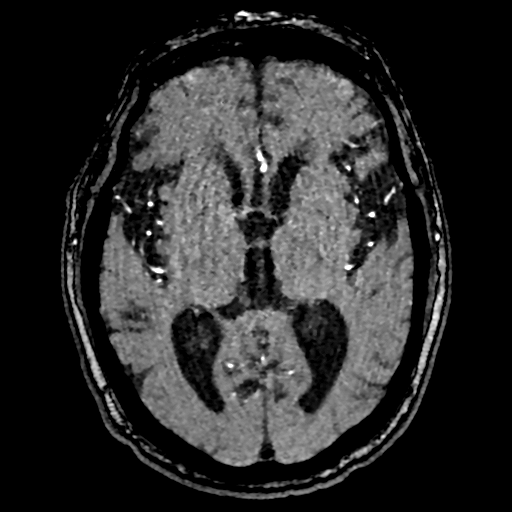
[im 201/243]
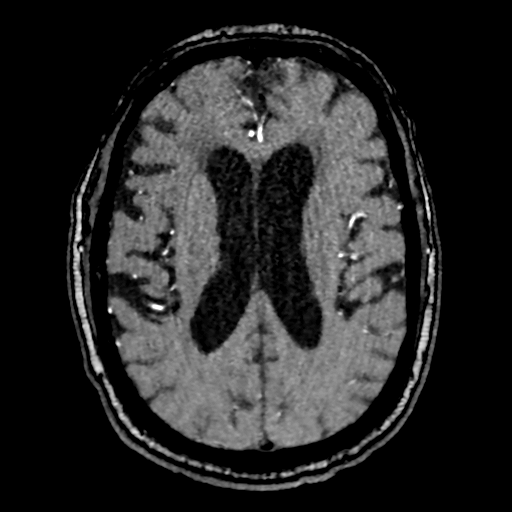
[im 206/243]
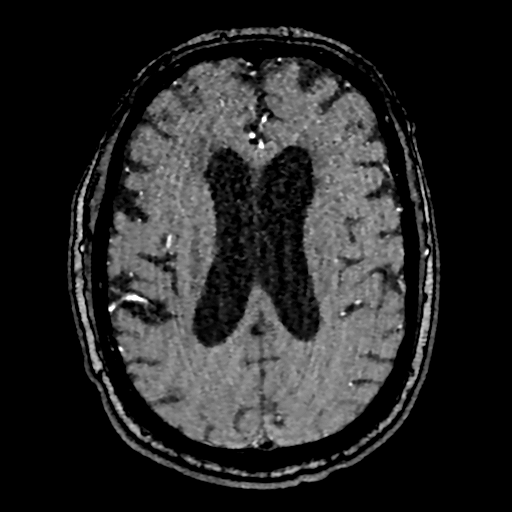
[im 232/243]
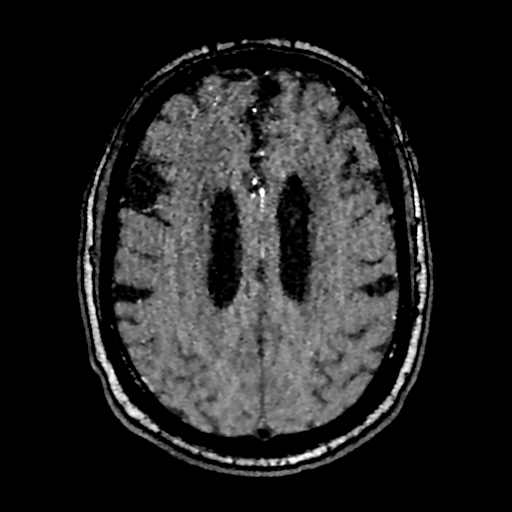

[19 of 48 positions shown; findings below may reference images not displayed]

FINDINGS: MRI HEAD FINDINGS

Brain: No acute infarct, mass effect or extra-axial collection. No
acute or chronic hemorrhage. Hyperintense T2-weighted signal is
widespread throughout the white matter. Diffuse, severe atrophy. The
midline structures are normal.

Vascular: Major flow voids are preserved.

Skull and upper cervical spine: Normal calvarium and skull base.
Visualized upper cervical spine and soft tissues are normal.

Sinuses/Orbits:No paranasal sinus fluid levels or advanced mucosal
thickening. No mastoid or middle ear effusion. Normal orbits.

MRA HEAD FINDINGS

POSTERIOR CIRCULATION:

--Vertebral arteries: Normal

--Inferior cerebellar arteries: Normal.

--Basilar artery: Normal.

--Superior cerebellar arteries: Normal.

--Posterior cerebral arteries: Normal.

ANTERIOR CIRCULATION:

--Intracranial internal carotid arteries: Normal.

--Anterior cerebral arteries (ACA): Normal.

--Middle cerebral arteries (MCA): Normal.

ANATOMIC VARIANTS: None
IMPRESSION: 1. No acute intracranial abnormality.
2. Diffuse, severe atrophy and chronic small vessel disease.
3. Normal intracranial MRA.

## 2021-04-04 MED ORDER — ONDANSETRON HCL 4 MG/2ML IJ SOLN
4.0000 mg | Freq: Four times a day (QID) | INTRAMUSCULAR | Status: DC | PRN
Start: 2021-04-04 — End: 2021-04-07

## 2021-04-04 MED ORDER — LEVOFLOXACIN 750 MG PO TABS: 750.0000 mg | ORAL_TABLET | Freq: Every day | ORAL | 0 refills | Status: DC

## 2021-04-04 MED ORDER — POLYSACCHARIDE IRON COMPLEX 150 MG PO CAPS
150.0000 mg | ORAL_CAPSULE | Freq: Every day | ORAL | Status: DC
  Administered 2021-04-04 – 2021-04-06 (×3): 150 mg via ORAL
  Filled 2021-04-04 (×5): qty 1

## 2021-04-04 MED ORDER — ENOXAPARIN SODIUM 40 MG/0.4ML IJ SOSY: 40.0000 mg | PREFILLED_SYRINGE | INTRAMUSCULAR | Status: DC

## 2021-04-04 MED ORDER — ONDANSETRON HCL 4 MG PO TABS: 4.0000 mg | ORAL_TABLET | Freq: Four times a day (QID) | ORAL | Status: DC | PRN

## 2021-04-04 MED ORDER — ACETAMINOPHEN 325 MG PO TABS
650.0000 mg | ORAL_TABLET | Freq: Four times a day (QID) | ORAL | Status: DC | PRN
  Administered 2021-04-04 – 2021-04-07 (×3): 650 mg via ORAL
  Filled 2021-04-04 (×3): qty 2

## 2021-04-04 MED ORDER — ACETAMINOPHEN 650 MG RE SUPP: 650.0000 mg | Freq: Four times a day (QID) | RECTAL | Status: DC | PRN

## 2021-04-04 MED ORDER — BENZONATATE 100 MG PO CAPS: 100.0000 mg | ORAL_CAPSULE | Freq: Three times a day (TID) | ORAL | 0 refills | Status: DC | PRN

## 2021-04-04 MED ORDER — SODIUM CHLORIDE 0.9 % IV SOLN: INTRAVENOUS | Status: DC

## 2021-04-04 MED ORDER — APIXABAN 5 MG PO TABS
5.0000 mg | ORAL_TABLET | Freq: Two times a day (BID) | ORAL | Status: DC
  Administered 2021-04-04 – 2021-04-07 (×8): 5 mg via ORAL
  Filled 2021-04-04 (×8): qty 1

## 2021-04-04 MED ORDER — ASCORBIC ACID 500 MG PO TABS
500.0000 mg | ORAL_TABLET | Freq: Every day | ORAL | Status: DC
  Administered 2021-04-04 – 2021-04-07 (×4): 500 mg via ORAL
  Filled 2021-04-04 (×4): qty 1

## 2021-04-04 MED ORDER — POTASSIUM CHLORIDE CRYS ER 20 MEQ PO TBCR
40.0000 meq | EXTENDED_RELEASE_TABLET | Freq: Once | ORAL | Status: AC
  Administered 2021-04-04: 40 meq via ORAL
  Filled 2021-04-04: qty 2

## 2021-04-04 NOTE — Plan of Care (Signed)
Patient was seen and examined at bedside in the ED, patient was lying comfortably, denied any active issues at this time, no chest pain or palpitation, no headache or dizziness.  Patient stated that he came in due to the fall and he does not know the reason.  Patient stated that his blood pressure is normally high at home that is why he is on blood pressure medication, never noticed low BP.  We will continue current treatment and continue to hold antihypertensive medications for now due to low blood pressure.

## 2021-04-04 NOTE — Consult Note (Signed)
Cardiology Consultation:   Patient ID: Trentyn Boisclair MRN: [id number]; DOB: [birth year]  Admit date: 04/03/2021 Date of Consult: 04/04/2021  PCP:  Juluis Pitch, MD   [facility] Providers Cardiologist:  Rockey Situ        Patient Profile:   Moe Graca is a 81 y.o. male with a hx of chronic deconditioning, atrial fibrillation, diastolic heart failure, hypertension who is being seen 04/04/2021 for the evaluation of elevated troponin at the request of Judd Gaudier.  History of Present Illness:   Mr. Kohles presented to the hospital with unsteady gait and falls over the last few days.  He had no injury.  He denied lightheadedness, or strokelike symptoms.  Troponin was checked and was found to be mildly elevated.  He does have a history of atrial fibrillation.  In review with the patient, he has had no chest pain or shortness of breath.  He overall feels well.  Prior to the last few days, he has been ambulatory without issue, doing all of his daily activities.  He states that his falls have been occurring when he stands up and walks.  Losartan 100 mg daily was added at his last cardiology appointment.   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 Authorizing Provider  acetaminophen  (TYLENOL) 500 MG tablet Take 500 mg by mouth 2 (two) times daily.   Yes [provider]  apixaban (ELIQUIS) 5 MG TABS tablet Take 1 tablet (5 mg total) by mouth 2 (two) times daily. 03/01/21  Yes Gollan, Kathlene November, MD  benzonatate (TESSALON PERLES) 100 MG capsule Take 1 capsule (100 mg total) by mouth 3 (three) times daily as needed for cough. 04/04/21  Yes Hinda Kehr, MD  levofloxacin (LEVAQUIN) 750 MG tablet Take 1 tablet (750 mg total) by mouth daily for 6 days. 04/04/21 04/10/21 Yes Hinda Kehr, MD  losartan (COZAAR) 100 MG tablet Take 1 tablet (100 mg total) by mouth daily. 03/01/21  Yes Minna Merritts, MD  spironolactone (ALDACTONE) 25 MG tablet Take 25 mg by mouth daily. Patient not taking: Reported on 04/04/2021 03/30/21   [provider]    Inpatient Medications: Scheduled Meds:  apixaban  5 mg Oral BID   Continuous Infusions:  sodium chloride 100 mL/hr at 04/04/21 1302   PRN Meds: acetaminophen **OR** acetaminophen, ondansetron **OR** ondansetron (ZOFRAN) IV  Allergies:   No Known Allergies  Social History:   Social History   Socioeconomic History   Marital status: Married    Spouse name: Not on file   Number of children: Not on file   Years of education: Not on file   Highest education level: Not on file  Occupational History   Not on file  Tobacco Use   Smoking status: Never   Smokeless tobacco: Never  Vaping Use   Vaping Use: Never used  Substance and Sexual Activity   Alcohol use: Not Currently   Drug use: Never   Sexual activity: Not on file  Other Topics Concern   Not on file  Social History Narrative   Not on file   Social Determinants of Health   Financial Resource Strain: Not on file  Food Insecurity: Not on file  Transportation Needs: Not on file  Physical Activity: Not on file  Stress: Not on file  Social Connections: Not on file  Intimate Partner Violence: Not on file    Family History:    Family History  Problem Relation  Age of Onset   CAD Mother    Lung cancer Father      ROS:  Please see the history of present illness.   All other ROS reviewed and negative.     Physical Exam/Data:   Vitals:   04/04/21 1000 04/04/21 1015 04/04/21 1200 04/04/21 1245  BP: 102/60  102/77   Pulse: (!) 38 84 (!) 43 61  Resp: 11  (!) 27 14  Temp:      TempSrc:      SpO2: 98%  95% 94%    Intake/Output Summary (Last 24 hours) at 04/04/2021 1311 Last data filed at 04/04/2021 0456 Gross per 24 hour  Intake 999 ml  Output --  Net 999 ml   Last 3 Weights 03/01/2021 02/04/2021 01/18/2021  Weight (lbs) 203 lb 8 oz 205 lb 6 oz 222 lb  Weight (kg) 92.307 kg 93.157 kg 100.699 kg     There is no height or weight on file to calculate BMI.  General:  Well nourished, well developed, in no acute distress HEENT: normal Neck: no JVD Vascular: No carotid bruits; Distal pulses 2+ bilaterally Cardiac: Irregular; no murmur  Lungs:  clear to auscultation bilaterally, no wheezing, rhonchi or rales  Abd: soft, nontender, no hepatomegaly  Ext: no edema Musculoskeletal:  No deformities, BUE and BLE strength normal and equal Skin: warm and dry  Neuro:  CNs 2-12 intact, no focal abnormalities noted Psych:  Normal affect   EKG:  The EKG was personally reviewed and demonstrates:  none new Telemetry:  Telemetry was personally reviewed and demonstrates:  atrial fibrillation  Relevant CV Studies: TTE 01/08/21  1. Left ventricular ejection fraction, by estimation, is 60 to 65%. The  left ventricle has normal function. The left ventricle has no regional  wall motion abnormalities. There is mild left ventricular hypertrophy.  Left ventricular diastolic parameters  are indeterminate.   2. Right ventricular systolic function is normal. The right ventricular  size is normal.   3. The mitral valve is normal in structure. Mild mitral valve  regurgitation. No evidence of mitral stenosis.   4. Rhythm is atrial fibrillation   Laboratory  Data:  High Sensitivity Troponin:   Recent Labs  Lab 04/03/21 1905 04/03/21 2300 04/04/21 1126  TROPONINIHS 42* 61* 37*     Chemistry Recent Labs  Lab 04/03/21 1905 04/04/21 1126  NA 135 139  K 3.6 3.5  CL 102 105  CO2 25 29  GLUCOSE 130* 103*  BUN 17 18  CREATININE 0.85 0.89  CALCIUM 9.0 8.3*  MG 1.7 1.8  GFRNONAA >60 >60  ANIONGAP 8 5    Recent Labs  Lab 04/03/21 1905  PROT 6.6  ALBUMIN 3.3*  AST  18  ALT 10  ALKPHOS 52  BILITOT 0.9   Lipids No results for input(s): CHOL, TRIG, HDL, LABVLDL, LDLCALC, CHOLHDL in the last 168 hours.  Hematology Recent Labs  Lab 04/03/21 1905 04/04/21 1126  WBC 11.6* 9.4  RBC 4.84 4.49  HGB 15.0 14.1  HCT 45.4 43.2  MCV 93.8 96.2  MCH 31.0 31.4  MCHC 33.0 32.6  RDW 12.7 12.9  PLT 221 185   Thyroid No results for input(s): TSH, FREET4 in the last 168 hours.  BNPNo results for input(s): BNP, PROBNP in the last 168 hours.  DDimer No results for input(s): DDIMER in the last 168 hours.   Radiology/Studies:  DG Chest 2 View  Result Date: 04/03/2021 CLINICAL DATA:  Weakness, multiple falls EXAM: CHEST - 2 VIEW COMPARISON:  01/07/2021 FINDINGS: Heart is normal size. No confluent airspace opacities or effusions. No acute bony abnormality. IMPRESSION: No active cardiopulmonary disease. Electronically Signed   By: Rolm Baptise M.D.   On: 04/03/2021 19:32   CT HEAD WO CONTRAST (5MM)  Result Date: 04/03/2021 CLINICAL DATA:  Multiple falls, altered mental status. Dizziness, nonspecific EXAM: CT HEAD WITHOUT CONTRAST TECHNIQUE: Contiguous axial images were obtained from the base of the skull through the vertex without intravenous contrast. COMPARISON:  01/07/2021 FINDINGS: Brain: There is atrophy and chronic small vessel disease changes. No acute intracranial abnormality. Specifically, no hemorrhage, hydrocephalus, mass lesion, acute infarction, or significant intracranial injury. Vascular: No hyperdense vessel or unexpected  calcification. Skull: No acute calvarial abnormality. Sinuses/Orbits: No acute findings Other: None IMPRESSION: Atrophy, chronic microvascular disease. No acute intracranial abnormality. Electronically Signed   By: Rolm Baptise M.D.   On: 04/03/2021 19:33   CT Cervical Spine Wo Contrast  Result Date: 04/03/2021 CLINICAL DATA:  Neck trauma (Age >= 65y).  Multiple falls. EXAM: CT CERVICAL SPINE WITHOUT CONTRAST TECHNIQUE: Multidetector CT imaging of the cervical spine was performed without intravenous contrast. Multiplanar CT image reconstructions were also generated. COMPARISON:  None. FINDINGS: Alignment: No subluxation. Skull base and vertebrae: No acute fracture. No primary bone lesion or focal pathologic process. Soft tissues and spinal canal: No prevertebral fluid or swelling. No visible canal hematoma. 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 Advanced degenerative changes at C5-6 and C6-7. Moderate to advanced bilateral diffuse degenerative facet disease, left greater than right. Upper chest: No acute findings Other: None IMPRESSION: Advanced degenerative changes.  No acute bony abnormality. Electronically Signed   By: Rolm Baptise M.D.   On: 04/03/2021 19:34   MR ANGIO HEAD WO CONTRAST  Result Date: 04/04/2021 CLINICAL DATA:  Dizziness EXAM: MRI HEAD WITHOUT CONTRAST MRA HEAD WITHOUT CONTRAST TECHNIQUE: Multiplanar, multi-echo pulse sequences of the brain and surrounding structures were acquired without intravenous contrast. Angiographic images of the Circle of Willis were acquired using MRA technique without intravenous contrast. COMPARISON:  No pertinent prior exam. FINDINGS: MRI HEAD FINDINGS Brain: No acute infarct, mass effect or extra-axial collection. No acute or chronic hemorrhage. Hyperintense T2-weighted signal is widespread throughout the white matter. Diffuse, severe atrophy. The midline structures are normal. Vascular: Major flow voids are preserved. Skull and upper  cervical spine: Normal calvarium and skull base. Visualized upper cervical spine and soft tissues are normal. Sinuses/Orbits:No paranasal sinus fluid levels or advanced mucosal thickening. No mastoid or middle ear effusion. Normal orbits. MRA HEAD FINDINGS POSTERIOR CIRCULATION: --Vertebral arteries: Normal --Inferior cerebellar arteries: Normal. --Basilar artery: Normal. --Superior cerebellar arteries: Normal. --Posterior cerebral arteries: Normal. ANTERIOR CIRCULATION: --Intracranial internal carotid arteries: Normal. --Anterior  cerebral arteries (ACA): Normal. --Middle cerebral arteries (MCA): Normal. ANATOMIC VARIANTS: None IMPRESSION: 1. No acute intracranial abnormality. 2. 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 3. Normal intracranial MRA. Electronically Signed   By: Ulyses Jarred M.D.   On: 04/04/2021 01:37   MR BRAIN WO CONTRAST  Result Date: 04/04/2021 CLINICAL DATA:  Dizziness EXAM: MRI HEAD WITHOUT CONTRAST MRA HEAD WITHOUT CONTRAST TECHNIQUE: Multiplanar, multi-echo pulse sequences of the brain and surrounding structures were acquired without intravenous contrast. Angiographic images of the Circle of Willis were acquired using MRA technique without intravenous contrast. COMPARISON:  No pertinent prior exam. FINDINGS: MRI HEAD FINDINGS Brain: No acute infarct, mass effect or extra-axial collection. No acute or chronic hemorrhage. Hyperintense T2-weighted signal is widespread throughout the white matter. Diffuse, severe atrophy. The midline structures are normal. Vascular: Major flow voids are preserved. Skull and upper cervical spine: Normal calvarium and skull base. Visualized upper cervical spine and soft tissues are normal. Sinuses/Orbits:No paranasal sinus fluid levels or advanced mucosal thickening. No mastoid or middle ear effusion. Normal orbits. MRA HEAD FINDINGS POSTERIOR CIRCULATION: --Vertebral arteries: Normal --Inferior cerebellar arteries: Normal. --Basilar artery:  Normal. --Superior cerebellar arteries: Normal. --Posterior cerebral arteries: Normal. ANTERIOR CIRCULATION: --Intracranial internal carotid arteries: Normal. --Anterior cerebral arteries (ACA): Normal. --Middle cerebral arteries (MCA): Normal. ANATOMIC VARIANTS: None IMPRESSION: 1. No acute intracranial abnormality. 2. 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 3. Normal intracranial MRA. Electronically Signed   By: Ulyses Jarred M.D.   On: 04/04/2021 01:37     Assessment and Plan:   Persistent atrial fibrillation: CHA2DS2-VASc of at least 4.  Continue anticoagulation.  Patient is minimally symptomatic from his atrial fibrillation.  Would continue with current outpatient therapy. Elevated troponin: Likely due to demand ischemia.  The patient does not endorse any cardiac symptoms such as shortness of breath or chest pain.  It is unclear to me as to why his troponin was checked, as he did not have any symptoms.  Would hold off on further work-up for now. Falls: Patient could be orthostatic causing his falls.  Would decrease losartan to 50 mg.  He should follow-up with his primary physician for blood pressure management.  Cardiology to sign off today.  We Japhet Morgenthaler continue with current management per primary team.  Losartan dosage as above.  Follow-up with cardiology as previously scheduled.  For questions or updates, please contact [place] Please consult [email or web address] for contact info under    Signed, Gavan Nordby Meredith Leeds, MD  04/04/2021 1:11 PM

## 2021-04-04 NOTE — ED Notes (Signed)
Patient transported to MRI 

## 2021-04-04 NOTE — H&P (Signed)
History and Physical    Steven Welch IRC:[id number] DOB: 1940/04/29 DOA: 04/03/2021  PCP: Juluis Pitch, MD   Patient coming from: home  I have personally briefly reviewed patient's relevant medical records in [place]  Chief Complaint: falls  HPI: Steven Welch is a 81 y.o. male with medical history significant for chronic physical deconditioning, atrial fibrillation on Eliquis diagnosed 12/2020, HFpEF, HTN, who presents to the ED due to unsteady gait resulting in falls at home over the past couple days.  He denies injury and stated that he was able to lower himself to the floor whenever he felt unsteady.  He denies preceding lightheadedness, visual disturbance, one-sided weakness numbness or tingling, palpitations chest pain or shortness of breath.  Denies recent illness such as nausea, vomiting, abdominal pain or diarrhea.  Denies cough, fever or chills.  Denies decreased oral intake, recent medication change or drug and alcohol use.   Of note, patient was hospitalized in October 2022 and SNF was recommended due to deconditioning but patient declined.  He was started on midodrine at the time because of low blood pressure, however at a follow-up appointment with cardiology on 12/5, losartan 100 mg was started .  ED course: On arrival mildly tachycardic at 101 with otherwise normal vitals  Blood work significant for troponin of 42-60 WBC 11.6 with lactic acid 2 Urinalysis pending  EKG, personally viewed and interpreted: A. fib with RVR of 114 with no acute ST-T wave changes  Imaging: Chest x-ray clear MRI/MRA head and neck showing no acute intracranial abnormality and normal MRA C-spine CT nonacute  Patient treated with an IV fluid bolus.  Hospitalist consulted for admission.  Review of Systems: As per HPI otherwise all other systems on review of systems negative.   Assessment/Plan    Recurrent falls   Unsteady gait   Physical deconditioning -MRI/MRA negative for acute  stroke - Had echo with normal EF October 2022 - Suspect in part medication related from spironolactone 25 daily and also recently started losartan from 03/01/2021 - Patient was previously on midodrine in October 2022 due to low blood pressures - Received IV fluid bolus in the ED - We will hold all antihypertensives for now - PT OT consult    Elevated troponin - Denies chest pain and EKG nonacute - Suspect demand ischemia from rapid A. fib, possible hypotensive episodes - Cardiology consult for advice on further risk stratification    Atrial fibrillation with rapid ventricular response (HCC) - Heart rate in the 1 teens, improved with IV hydration - We will hold off on beta-blockers due to prior documentation of bradycardia with beta-blockers - Continue apixaban   DVT prophylaxis: Eliquis Code Status: full code  Family Communication:  none  Disposition Plan: Back to previous home environment Consults called: none  Status:observation    Physical Exam: Vitals:   04/03/21 1856 04/04/21 0003 04/04/21 0145  BP: 129/72 111/71 122/67  Pulse: (!) 101 88 (!) 52  Resp: 14 16 15   Temp: 97.6 F (36.4 C) 97.6 F (36.4 C)   TempSrc:  Oral   SpO2: 95% 98% 92%   Constitutional: Lethargic, chronically ill-appearing, oriented x 3 . Not in any apparent distress HEENT:      Head: Normocephalic and atraumatic.         Eyes: PERLA, EOMI, Conjunctivae are normal. Sclera is non-icteric.       Mouth/Throat: Mucous membranes are moist.       Neck: Supple with no signs of meningismus. Cardiovascular: Regular  rate and rhythm. No murmurs, gallops, or rubs. 2+ symmetrical distal pulses are present . No JVD. 2+ LE edema Respiratory: Irregular, tachycardic.Lungs sounds clear bilaterally. No wheezes, crackles, or rhonchi.  Gastrointestinal: Soft, non tender, non distended. Positive bowel sounds.  Genitourinary: No CVA tenderness. Musculoskeletal: Nontender with normal range of motion in all  extremities. No cyanosis, or erythema of extremities. Neurologic:  Face is symmetric. Moving all extremities. No gross focal neurologic deficits . Skin: Skin is warm, dry.  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 He has never used smokeless tobacco. He reports that he does not currently use alcohol. He reports that he does not use drugs.  No Known Allergies  Family History  Problem Relation Age of Onset   CAD Mother    Lung cancer Father       Prior to Admission medications   Medication Sig Start Date End Date Taking? Authorizing Provider  benzonatate (TESSALON PERLES) 100 MG capsule Take 1 capsule (100 mg total) by mouth 3 (three) times daily as needed for cough. 04/04/21  Yes Hinda Kehr, MD  levofloxacin (LEVAQUIN) 750 MG tablet Take 1 tablet (750 mg total) by mouth daily for 6 days. 04/04/21 04/10/21 Yes Hinda Kehr, MD  apixaban (ELIQUIS) 5 MG TABS tablet Take 1 tablet (5 mg total) by mouth 2 (two) times daily. 03/01/21   Minna Merritts, MD  losartan (COZAAR) 100 MG tablet Take 1 tablet (100 mg total) by mouth daily. 03/01/21   Minna Merritts, MD      Labs on Admission: I have personally reviewed following labs and imaging studies  CBC: Recent Labs  Lab  04/03/21 1905  WBC 11.6*  NEUTROABS 9.6*  HGB 15.0  HCT 45.4  MCV 93.8  PLT 956   Basic Metabolic Panel: Recent Labs  Lab 04/03/21 1905  NA 135  K 3.6  CL 102  CO2 25  GLUCOSE 130*  BUN 17  CREATININE 0.85  CALCIUM 9.0  MG 1.7   GFR: CrCl cannot be calculated (Unknown ideal weight.). Liver Function Tests: Recent Labs  Lab 04/03/21 1905  AST 18  ALT 10  ALKPHOS 52  BILITOT 0.9  PROT 6.6  ALBUMIN 3.3*   No results for input(s): LIPASE, AMYLASE in the last 168 hours. No results for input(s): AMMONIA in the last 168 hours. Coagulation Profile: Recent Labs  Lab 04/03/21 1905  INR 1.1   Cardiac Enzymes: No results for input(s): CKTOTAL, CKMB, CKMBINDEX, TROPONINI in the last 168 hours. BNP (last 3 results) No results for input(s): PROBNP in the last 8760 hours. HbA1C: No results for input(s): HGBA1C in the  last 72 hours. CBG: No results for input(s): GLUCAP in the last 168 hours. Lipid Profile: No results for input(s): CHOL, HDL, LDLCALC, TRIG, CHOLHDL, LDLDIRECT in the last 72 hours. Thyroid Function Tests: No results for input(s): TSH, T4TOTAL, FREET4, T3FREE, THYROIDAB in the last 72 hours. Anemia Panel: No results for input(s): VITAMINB12, FOLATE, FERRITIN, TIBC, IRON, RETICCTPCT in the last 72 hours. Urine analysis:    Component Value Date/Time   COLORURINE YELLOW (A) 01/07/2021 1145   APPEARANCEUR CLEAR (A) 01/07/2021 1145   APPEARANCEUR Clear 02/12/2014 1357   LABSPEC 1.041 (H) 01/07/2021 1145   LABSPEC 1.018 02/12/2014 1357   PHURINE 5.0 01/07/2021 1145   GLUCOSEU NEGATIVE 01/07/2021 1145   GLUCOSEU Negative 02/12/2014 1357   HGBUR NEGATIVE 01/07/2021 [place] 01/07/2021 1145   BILIRUBINUR Negative 02/12/2014 1357   KETONESUR NEGATIVE 01/07/2021 1145   PROTEINUR 30 (A) 01/07/2021 1145   NITRITE NEGATIVE 01/07/2021 1145   LEUKOCYTESUR NEGATIVE 01/07/2021 1145   LEUKOCYTESUR Negative 02/12/2014 1357    Radiological  Exams on Admission: DG Chest 2 View  Result Date: 04/03/2021 CLINICAL DATA:  Weakness, multiple falls EXAM: CHEST - 2 VIEW COMPARISON:  01/07/2021 FINDINGS: Heart is normal size. No confluent airspace opacities or effusions. No acute bony abnormality. IMPRESSION: No active cardiopulmonary disease. Electronically Signed   By: Rolm Baptise M.D.   On: 04/03/2021 19:32   CT HEAD WO CONTRAST (5MM)  Result Date: 04/03/2021 CLINICAL DATA:  Multiple falls, altered mental status. Dizziness, nonspecific EXAM: CT HEAD WITHOUT CONTRAST TECHNIQUE: Contiguous axial images were obtained from the base of the skull through the vertex without intravenous contrast. COMPARISON:  01/07/2021 FINDINGS: Brain: There is atrophy and chronic small vessel disease changes. No acute intracranial abnormality. Specifically, no hemorrhage, hydrocephalus, mass lesion, acute infarction, or significant intracranial injury. Vascular: No hyperdense vessel or unexpected calcification. Skull: No acute calvarial abnormality. Sinuses/Orbits: No acute findings Other: None IMPRESSION: Atrophy, chronic microvascular disease. No acute intracranial abnormality. Electronically Signed   By: Rolm Baptise M.D.   On: 04/03/2021 19:33   CT Cervical Spine Wo Contrast  Result Date: 04/03/2021 CLINICAL DATA:  Neck trauma (Age >= 65y).  Multiple falls. EXAM: CT CERVICAL SPINE WITHOUT CONTRAST TECHNIQUE: Multidetector CT imaging of the cervical spine was performed without intravenous contrast. Multiplanar CT image reconstructions were also generated. COMPARISON:  None. FINDINGS: Alignment: No subluxation. Skull base and vertebrae: No acute fracture. No primary bone lesion or focal pathologic process. Soft tissues and spinal canal: No prevertebral fluid or swelling. No visible canal hematoma. 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 Advanced degenerative changes at C5-6 and C6-7. Moderate to advanced bilateral diffuse degenerative facet  disease, left greater than right. Upper chest: No acute findings Other: None IMPRESSION: Advanced degenerative changes.  No acute bony abnormality. Electronically Signed   By: Rolm Baptise M.D.   On: 04/03/2021 19:34   MR ANGIO HEAD WO CONTRAST  Result Date: 04/04/2021 CLINICAL DATA:  Dizziness EXAM: MRI HEAD WITHOUT CONTRAST MRA HEAD WITHOUT CONTRAST TECHNIQUE: Multiplanar, multi-echo pulse sequences of the brain and surrounding structures were acquired without intravenous contrast. Angiographic images of the Circle of Willis were acquired using MRA technique without intravenous contrast. COMPARISON:  No pertinent prior exam. FINDINGS: MRI HEAD FINDINGS Brain: No acute infarct, mass effect or extra-axial collection. No acute or chronic hemorrhage. Hyperintense T2-weighted signal is widespread throughout the white matter. Diffuse, severe atrophy. The midline structures are normal. Vascular: Major flow voids are preserved. Skull and upper  cervical spine: Normal calvarium and skull base. Visualized upper cervical spine and soft tissues are normal. Sinuses/Orbits:No paranasal sinus fluid levels or advanced mucosal thickening. No mastoid or middle ear effusion. Normal orbits. MRA HEAD FINDINGS POSTERIOR CIRCULATION: --Vertebral arteries: Normal --Inferior cerebellar arteries: Normal. --Basilar artery: Normal. --Superior cerebellar arteries: Normal. --Posterior cerebral arteries: Normal. ANTERIOR CIRCULATION: --Intracranial internal carotid arteries: Normal. --Anterior cerebral arteries (ACA): Normal. --Middle cerebral arteries (MCA): Normal. ANATOMIC VARIANTS: None IMPRESSION: 1. No acute intracranial abnormality. 2. 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 3. Normal intracranial MRA. Electronically Signed   By: Ulyses Jarred M.D.   On: 04/04/2021 01:37   MR BRAIN WO CONTRAST  Result Date: 04/04/2021 CLINICAL DATA:  Dizziness EXAM: MRI HEAD WITHOUT CONTRAST MRA HEAD WITHOUT CONTRAST TECHNIQUE:  Multiplanar, multi-echo pulse sequences of the brain and surrounding structures were acquired without intravenous contrast. Angiographic images of the Circle of Willis were acquired using MRA technique without intravenous contrast. COMPARISON:  No pertinent prior exam. FINDINGS: MRI HEAD FINDINGS Brain: No acute infarct, mass effect or extra-axial collection. No acute or chronic hemorrhage. Hyperintense T2-weighted signal is widespread throughout the white matter. Diffuse, severe atrophy. The midline structures are normal. Vascular: Major flow voids are preserved. Skull and upper cervical spine: Normal calvarium and skull base. Visualized upper cervical spine and soft tissues are normal. Sinuses/Orbits:No paranasal sinus fluid levels or advanced mucosal thickening. No mastoid or middle ear effusion. Normal orbits. MRA HEAD FINDINGS POSTERIOR CIRCULATION: --Vertebral arteries: Normal --Inferior cerebellar arteries: Normal. --Basilar artery: Normal. --Superior cerebellar arteries: Normal. --Posterior cerebral arteries: Normal. ANTERIOR CIRCULATION: --Intracranial internal carotid arteries: Normal. --Anterior cerebral arteries (ACA): Normal. --Middle cerebral arteries (MCA): Normal. ANATOMIC VARIANTS: None IMPRESSION: 1. No acute intracranial abnormality. 2. 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 3. Normal intracranial MRA. Electronically Signed   By: Ulyses Jarred M.D.   On: 04/04/2021 01:37       Athena Masse MD [facility]   04/04/2021, 2:06 AM

## 2021-04-04 NOTE — ED Notes (Addendum)
Pt ambulated to restroom with 2 staff assist. It was very difficult for the pt to ambulate to the restroom. High fall risk. Pt returned safely to bed. Pt unable to give UA. Pt had large bowel movement.

## 2021-04-04 NOTE — ED Notes (Signed)
Completed a full bed lien change and changed pt into a new gown.

## 2021-04-05 ENCOUNTER — Observation Stay: Payer: Medicare Other

## 2021-04-05 DIAGNOSIS — R296 Repeated falls: Secondary | ICD-10-CM | POA: Diagnosis not present

## 2021-04-05 LAB — URINALYSIS, COMPLETE (UACMP) WITH MICROSCOPIC
Bacteria, UA: NONE SEEN
Bilirubin Urine: NEGATIVE
Glucose, UA: NEGATIVE mg/dL
Hgb urine dipstick: NEGATIVE
Ketones, ur: NEGATIVE mg/dL
Leukocytes,Ua: NEGATIVE
Nitrite: NEGATIVE
Protein, ur: 100 mg/dL — AB
Specific Gravity, Urine: 1.014 (ref 1.005–1.030)
Squamous Epithelial / HPF: NONE SEEN (ref 0–5)
pH: 5 (ref 5.0–8.0)

## 2021-04-05 LAB — BASIC METABOLIC PANEL
Anion gap: 5 (ref 5–15)
BUN: 22 mg/dL (ref 8–23)
CO2: 30 mmol/L (ref 22–32)
Calcium: 8.4 mg/dL — ABNORMAL LOW (ref 8.9–10.3)
Chloride: 102 mmol/L (ref 98–111)
Creatinine, Ser: 1.07 mg/dL (ref 0.61–1.24)
GFR, Estimated: >60 mL/min (ref >60)
Glucose, Bld: 105 mg/dL — ABNORMAL HIGH (ref 70–99)
Potassium: 4.1 mmol/L (ref 3.5–5.1)
Sodium: 137 mmol/L (ref 135–145)

## 2021-04-05 LAB — CBC
HCT: 43.6 % (ref 39.0–52.0)
Hemoglobin: 14.1 g/dL (ref 13.0–17.0)
MCH: 30.8 pg (ref 26.0–34.0)
MCHC: 32.3 g/dL (ref 30.0–36.0)
MCV: 95.2 fL (ref 80.0–100.0)
Platelets: 198 10*3/uL (ref 150–400)
RBC: 4.58 MIL/uL (ref 4.22–5.81)
RDW: 13.1 % (ref 11.5–15.5)
WBC: 7.2 10*3/uL (ref 4.0–10.5)
nRBC: 0 % (ref 0.0–0.2)

## 2021-04-05 LAB — PHOSPHORUS: Phosphorus: 3.4 mg/dL (ref 2.5–4.6)

## 2021-04-05 LAB — MAGNESIUM: Magnesium: 2 mg/dL (ref 1.7–2.4)

## 2021-04-05 IMAGING — DX DG SHOULDER 2+V*L*
3 series · 3 of 3 positions shown · non-contrast
Comparison: None.

CLINICAL DATA: Left shoulder pain after fall

EXAM:
LEFT SHOULDER - 2+ VIEW

[shoulder obl]
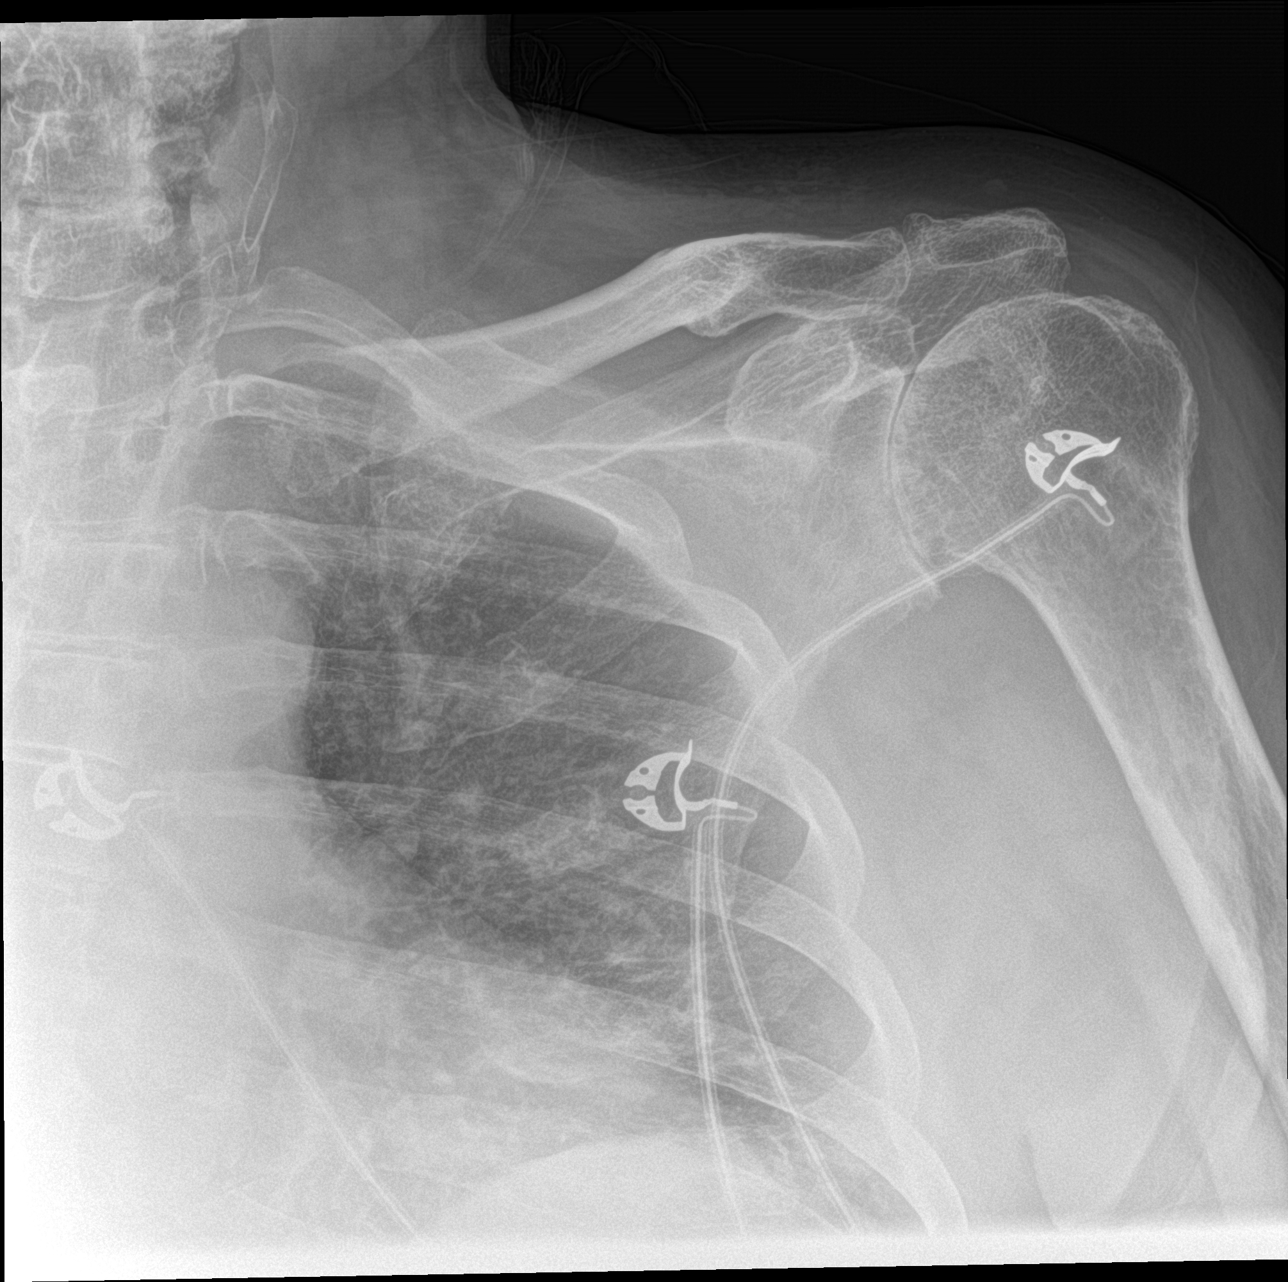

[shoulder axial (1 of 2)]
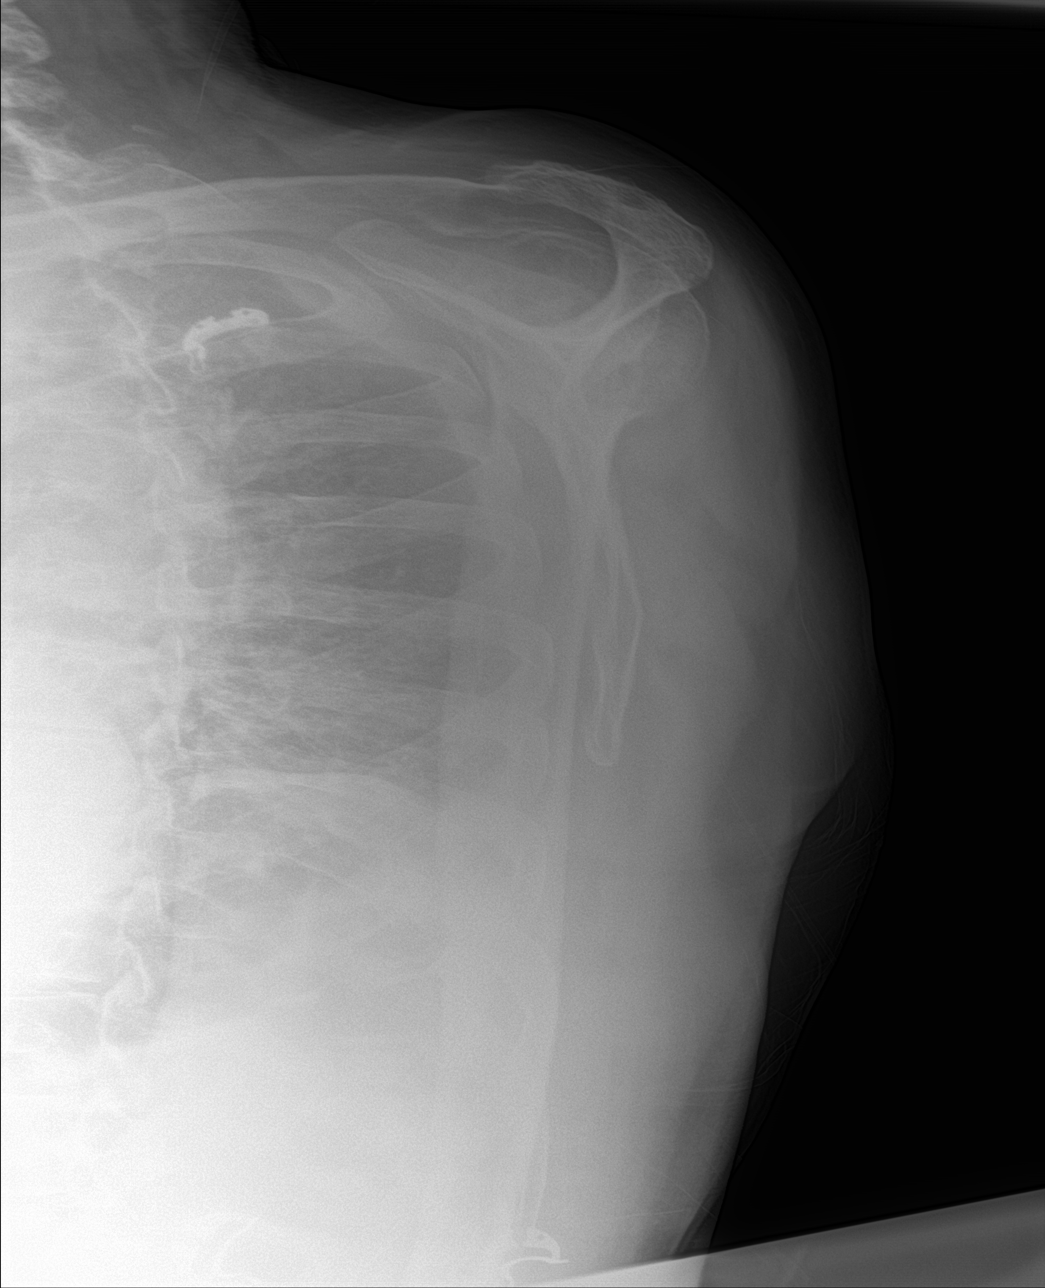

[shoulder axial (2 of 2)]
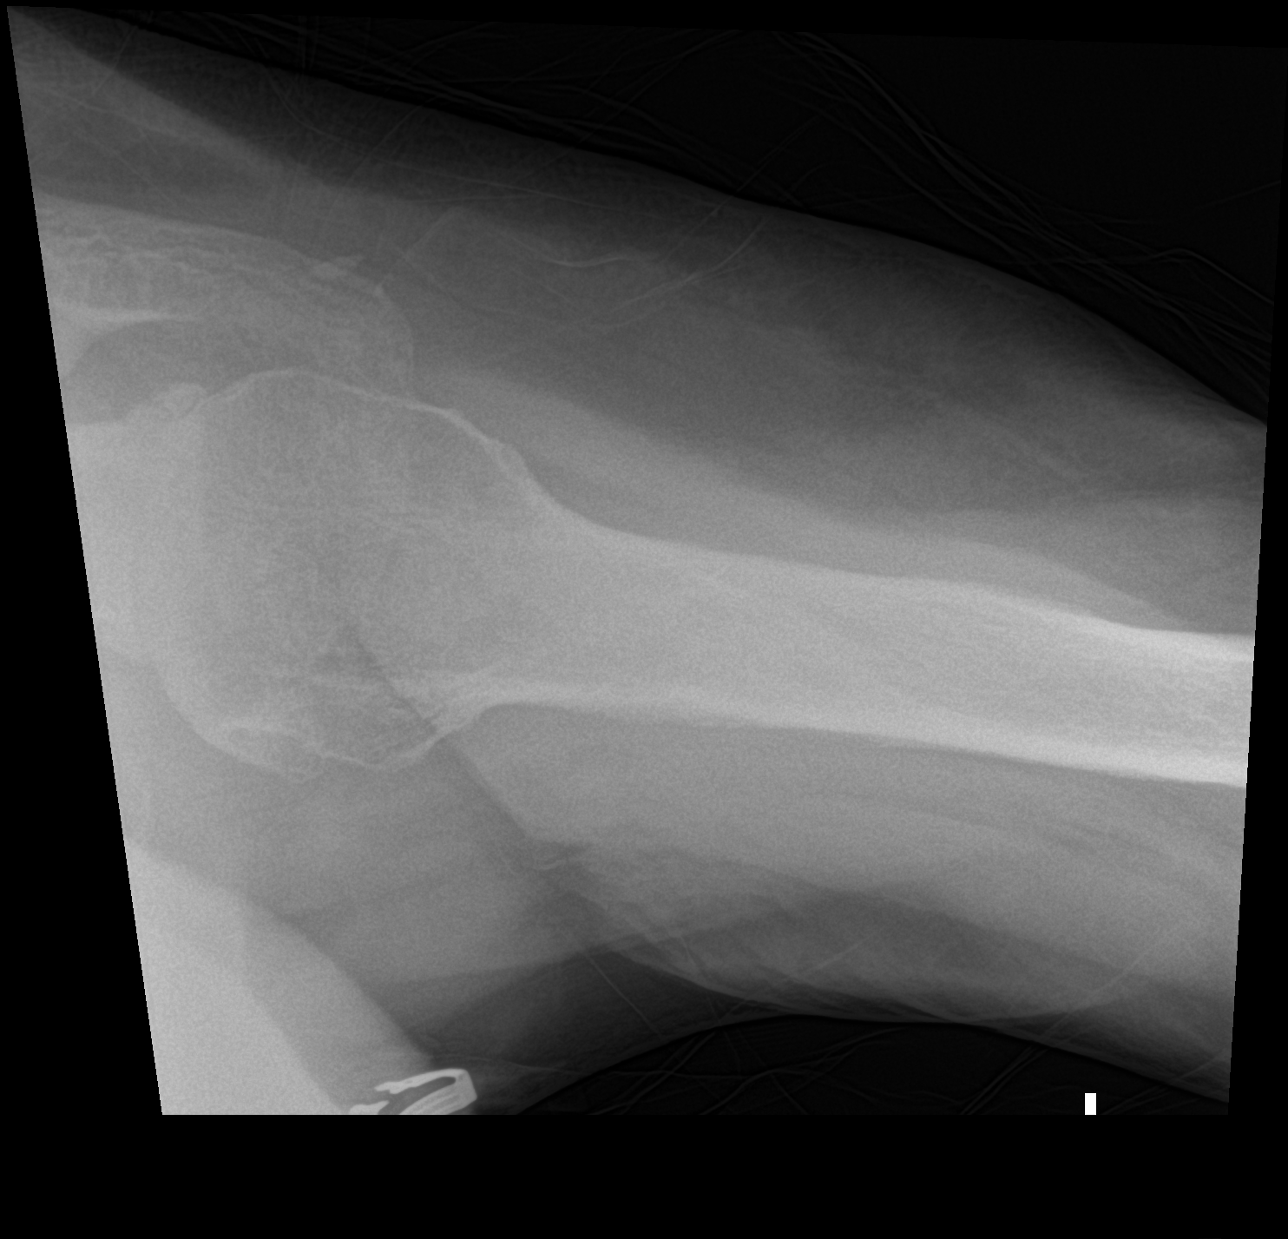

[3 of 3 positions shown; findings below may reference images not displayed]

FINDINGS: There is no evidence of fracture or dislocation. Severe degenerative
changes of the glenohumeral joint. Mild-to-moderate AC joint
arthropathy. Soft tissues are unremarkable.
IMPRESSION: No acute fracture or dislocation. Severe degenerative changes of the
left glenohumeral joint.

## 2021-04-05 MED ORDER — VITAMIN D (ERGOCALCIFEROL) 1.25 MG (50000 UNIT) PO CAPS
50000.0000 [IU] | ORAL_CAPSULE | ORAL | Status: DC
  Administered 2021-04-05: 50000 [IU] via ORAL
  Filled 2021-04-05: qty 1

## 2021-04-05 MED ORDER — SODIUM CHLORIDE 0.9 % IV SOLN: INTRAVENOUS | Status: AC

## 2021-04-05 MED ORDER — DILTIAZEM HCL 60 MG PO TABS
30.0000 mg | ORAL_TABLET | Freq: Four times a day (QID) | ORAL | Status: DC
  Administered 2021-04-05 – 2021-04-06 (×2): 30 mg via ORAL
  Filled 2021-04-05 (×3): qty 1

## 2021-04-05 NOTE — Progress Notes (Signed)
[facility] Progress Note  Patient: Steven Welch    ZOX:[id number]  DOA: 04/03/2021     Date of Service: the patient was seen and examined on 04/05/2021  Chief Complaint  Patient presents with   Fall   Brief hospital course: Steven Welch is a 81 y.o. male with medical history significant for chronic physical deconditioning, atrial fibrillation on Eliquis diagnosed 12/2020, HFpEF, HTN, who presents to the ED due to unsteady gait resulting in falls at home over the past couple days.  He denies injury and stated that he was able to lower himself to the floor whenever he felt unsteady.  He denies preceding lightheadedness, visual disturbance, one-sided weakness numbness or tingling, palpitations chest pain or shortness of breath.  Denies recent illness such as nausea, vomiting, abdominal pain or diarrhea.  Denies cough, fever or chills.  Denies decreased oral intake, recent medication change or drug and alcohol use.   Of note, patient was hospitalized in October 2022 and SNF was recommended due to deconditioning but patient declined.  He was started on midodrine at the time because of low blood pressure, however at a follow-up appointment with cardiology on 12/5, losartan 100 mg was started .   ED course: On arrival mildly tachycardic at 101 with otherwise normal vitals Blood work significant for troponin of 42-60 WBC 11.6 with lactic acid 2 Urinalysis pending   EKG, personally viewed and interpreted: A. fib with RVR of 114 with no acute ST-T wave changes   Imaging: Chest x-ray clear MRI/MRA head and neck showing no acute intracranial abnormality and normal MRA C-spine CT nonacute   Patient treated with an IV fluid bolus.  Hospitalist consulted for admission.  Assessment and Plan: Recurrent falls   Unsteady gait   Physical deconditioning -MRI/MRA negative for acute stroke - Had echo with normal EF October 2022 - Suspect in part medication related from spironolactone 25 daily and also  recently started losartan from 03/01/2021 - Patient was previously on midodrine in October 2022 due to low blood pressures - Received IV fluid bolus in the ED - We will hold all antihypertensives for now - PT OT consult 1/9 blood pressure elevated and patient is tachy/bradycardia, so started Cardizem 30 mg every 6 hourly with holding parameters    Elevated troponin - Denies chest pain and EKG nonacute - Suspect demand ischemia from rapid A. fib, possible hypotensive episodes - Cardiology consult, recommended no further work-up and signed off.        Atrial fibrillation with rapid ventricular response (HCC) - Heart rate in the 1 teens, improved with IV hydration - We will hold off on beta-blockers due to prior documentation of bradycardia with beta-blockers - Continue apixaban Started Cardizem 30 mg Q6 hourly with holding parameters.   Iron deficiency, iron saturation 10%, started oral supplement.  Vitamin D insufficiency, started vitamin D 50,000 units p.o. weekly.   PT and OT evaluation done, recommended decreased movement of the left shoulder,  X-ray left shoulder: No acute fracture or dislocation. 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  Interventions:       Diet: Heart healthy DVT Prophylaxis: Therapeutic Anticoagulation with Eliquis    Advance goals of care discussion: Full code  Family Communication: family was NOT present at bedside, at the time of interview.  The pt provided permission to discuss medical plan with the family. Opportunity was given to ask  question and all questions were answered satisfactorily.   Disposition:  Pt is from Home, admitted with hypotension and frequent falls, still has high risk for fall, which precludes a safe discharge. Discharge to SNF, when bed will be available.  Subjective: No significant events overnight,  patient was lying comfortably, denied any chest pain or palpitation, no shortness of breath, no abdominal pain, no nausea vomiting or diarrhea.  Patient was feeling some discomfort on the ED bed.  Physical Exam: General:  alert oriented to time, place, and person.  Appear in no distress, affect appropriate Eyes: PERRLno ENT: Oral Mucosa Clear, moist  Neck: no JVD,  Cardiovascular: S1 and S2 Present, no Murmur, irregular rhythm Respiratory: good respiratory effort, Bilateral Air entry equal and Decreased, no Crackles, no wheezes Abdomen: Bowel Sound present, Soft and no tenderness,  Skin: no rashes Extremities: no Pedal edema, no calf tenderness Neurologic: without any new focal findings Gait not checked due to patient safety concerns  Vitals:   04/05/21 0800 04/05/21 1010 04/05/21 1200 04/05/21 1345  BP: (!) 152/93 (!) 163/81 (!) 158/94   Pulse:  (!) 58 (!) 57 (!) 55  Resp: 20 20 12    Temp:      TempSrc:      SpO2: 99% 96% 95% 98%  Weight:   92.1 kg    No intake or output data in the 24 hours ending 04/05/21 1419 Filed Weights   04/05/21 1200  Weight: 92.1 kg    Data Reviewed: I have personally reviewed and interpreted daily labs, tele strips, imagings as discussed above. I reviewed all nursing notes, pharmacy notes, vitals, pertinent old records I have discussed plan of care as described above with RN and patient/family.  CBC: Recent Labs  Lab 04/03/21 1905 04/04/21 1126 04/05/21 0722  WBC 11.6* 9.4 7.2  NEUTROABS 9.6*  --   --   HGB 15.0 14.1 14.1  HCT 45.4 43.2 43.6  MCV 93.8 96.2 95.2  PLT 221 185 814   Basic Metabolic Panel: Recent Labs  Lab 04/03/21 1905 04/04/21 1126 04/05/21 0722  NA 135 139 137  K 3.6 3.5 4.1  CL 102 105 102  CO2 25 29 30   GLUCOSE 130* 103* 105*  BUN 17 18 22   CREATININE 0.85 0.89 1.07  CALCIUM 9.0 8.3* 8.4*  MG 1.7 1.8 2.0  PHOS  --  2.5 3.4    Studies: DG Shoulder Left  Result Date: 04/05/2021 CLINICAL DATA:  Left  shoulder pain after fall EXAM: LEFT SHOULDER - 2+ VIEW COMPARISON:  None. FINDINGS: There is no evidence of fracture or dislocation. 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 Mild-to-moderate AC joint arthropathy. Soft tissues are unremarkable. IMPRESSION: No acute fracture or dislocation. 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 Electronically Signed   By: Davina Poke D.O.   On: 04/05/2021 13:57    Scheduled Meds:  apixaban  5 mg Oral BID   vitamin C  500 mg Oral Daily   diltiazem  30 mg Oral Q6H   iron polysaccharides  150 mg Oral Daily   Vitamin D (Ergocalciferol)  50,000 Units Oral Q7 days   Continuous Infusions:  sodium chloride 100 mL/hr at 04/05/21 0552   PRN Meds: acetaminophen **OR** acetaminophen, ondansetron **OR** ondansetron (ZOFRAN) IV  Time spent: 35 minutes  Author: Val Riles. MD [facility] 04/05/2021 2:19 PM  To reach On-call, see care teams to locate the attending and reach out to them via [email or web address]. If 7PM-7AM, please contact night-coverage If  you still have difficulty reaching the attending provider, please page the [facility] (Director on Call) for [facility] on amion for assistance.

## 2021-04-05 NOTE — Progress Notes (Signed)
°   04/05/21 0950  Clinical Encounter Type  Visited With Family  Visit Type Initial;Spiritual support;Social support  Referral From Nurse  Consult/Referral To Chaplain  Stress Factors  Family Stress Factors Loss of control;Financial concerns;Health changes;Exhausted   Chaplain was paged by medical staff, that PT family (his wife and one of his daughter) has question regarding our notary services. Chaplain did AD education, and let PT's family know that AD is for HPOA. PT's wife was very tearful regarding her husband physical health declining, and the fact that he may have to go to a nursing facility. Chaplain normalized PT's wife's emotion, ministered with compassionate presence, and reflective listening. Chaplain also told PT's family to ask nurse to get tin touch with one of our case workers to further assist with the nursing home process. On-call chaplain is available if further needed.  Andee Poles, MDiv

## 2021-04-05 NOTE — ED Notes (Signed)
Pt alert.  Pure wick in place  family with pt.  Pt waiting on admission bed.

## 2021-04-05 NOTE — ED Notes (Signed)
Pt care taken, assisted to bathroom, is unsteady on feet, needs assistance. A&O x4 no other complaints

## 2021-04-05 NOTE — Care Management Obs Status (Signed)
[place] NOTIFICATION   Patient Details  Name: Steven Welch MRN: [id number] Date of Birth: November 22, 1940   Medicare Observation Status Notification Given:  Yes    Shelbie Hutching, RN 04/05/2021, 4:51 PM

## 2021-04-05 NOTE — Progress Notes (Signed)
°   04/05/21 0950  Clinical Encounter Type  Visited With Family  Visit Type Initial;Spiritual support;Social support  Referral From Nurse  Consult/Referral To Chaplain  Stress Factors  Family Stress Factors Loss of control;Financial concerns;Health changes;Exhausted   Chaplain was paged by medical staff, that PT's family (his wife and one of his daughter) has question regarding our notary services. Chaplain did AD education, and let PT's family know that notary services here is only for HPOA. PT's wife was very tearful regarding her husband physical health declining, and the fact that he may have to go to a nursing facility. Chaplain normalized PT's wife's emotion, ministered with compassionate presence, and reflective listening. Chaplain also told PT's family to ask nurse to get in touch with one of our case workers to further assist with the nursing home process. On-call chaplain is available if further needed.

## 2021-04-05 NOTE — NC FL2 (Signed)
[place] LEVEL OF CARE SCREENING TOOL     IDENTIFICATION  Patient Name: Steven Welch Birthdate: 09/03/1940 Sex: male Admission Date (Current Location): 04/03/2021  [facility] and [place] Number:  Engineering geologist and Address:  [facility], [address] [place], [place] [address]      Provider Number: [id number]  Attending Physician Name and Address:  Val Riles, MD  Relative Name and Phone Number:  Wahid Holley (wife) [phone]    Current Level of Care: Hospital Recommended Level of Care: [place] Prior Approval Number:    Date Approved/Denied:   PASRR Number: [id number]  Discharge Plan: SNF    Current Diagnoses: Patient Active Problem List   Diagnosis Date Noted   Elevated troponin 04/04/2021   Hypertension    Recurrent falls    Unsteady gait    Sepsis ([place]) 01/07/2021   Atrial fibrillation with rapid ventricular response ([place]) 01/07/2021   Status post total hip replacement, 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    Orientation RESPIRATION BLADDER Height & Weight     Self, Time, Situation, Place  Normal Continent Weight: 92.1 kg Height:     BEHAVIORAL SYMPTOMS/MOOD NEUROLOGICAL BOWEL NUTRITION STATUS      Continent Diet (heart healthy)  AMBULATORY STATUS COMMUNICATION OF NEEDS Skin   Extensive Assist Verbally Normal                       Personal Care Assistance Level of Assistance  Bathing, Feeding, Dressing Bathing Assistance: Limited assistance Feeding assistance: Limited assistance Dressing Assistance: Limited assistance     Functional Limitations Info  Sight, Hearing, Speech Sight Info: Adequate Hearing Info: Impaired (HOH) Speech Info: Adequate    SPECIAL CARE FACTORS FREQUENCY  OT (By licensed OT), PT (By licensed PT)     PT Frequency: 5 times per week OT Frequency: 5 times per week            Contractures Contractures Info: Not present    Additional Factors Info  Code Status,  Allergies Code Status Info: Full Allergies Info: NKA           Current Medications (04/05/2021):  This is the current hospital active medication list Current Facility-Administered Medications  Medication Dose Route Frequency Provider Last Rate Last Admin   0.9 %  sodium chloride infusion   Intravenous Continuous Val Riles, MD 50 mL/hr at 04/05/21 1507 New Bag at 04/05/21 1507   acetaminophen (TYLENOL) tablet 650 mg  650 mg Oral Q6H PRN Athena Masse, MD   650 mg at 04/04/21 2159   Or   acetaminophen (TYLENOL) suppository 650 mg  650 mg Rectal Q6H PRN Athena Masse, MD       apixaban Arne Cleveland) tablet 5 mg  5 mg Oral BID Judd Gaudier V, MD   5 mg at 04/05/21 1009   ascorbic acid (VITAMIN C) tablet 500 mg  500 mg Oral Daily Val Riles, MD   500 mg at 04/05/21 1009   diltiazem (CARDIZEM) tablet 30 mg  30 mg Oral Q6H Val Riles, MD       iron polysaccharides (NIFEREX) capsule 150 mg  150 mg Oral Daily Val Riles, MD   150 mg at 04/05/21 1009   ondansetron (ZOFRAN) tablet 4 mg  4 mg Oral Q6H PRN Athena Masse, MD       Or   ondansetron [facility]) injection 4 mg  4 mg Intravenous Q6H PRN Athena Masse, MD  Vitamin D (Ergocalciferol) (DRISDOL) capsule 50,000 Units  50,000 Units Oral Q7 days Val Riles, MD   50,000 Units at 04/05/21 1009   Current Outpatient Medications  Medication Sig Dispense Refill   acetaminophen (TYLENOL) 500 MG tablet Take 500 mg by mouth 2 (two) times daily.     apixaban (ELIQUIS) 5 MG TABS tablet Take 1 tablet (5 mg total) by mouth 2 (two) times daily. 60 tablet 6   benzonatate (TESSALON PERLES) 100 MG capsule Take 1 capsule (100 mg total) by mouth 3 (three) times daily as needed for cough. 30 capsule 0   levofloxacin (LEVAQUIN) 750 MG tablet Take 1 tablet (750 mg total) by mouth daily for 6 days. 6 tablet 0   losartan (COZAAR) 100 MG tablet Take 1 tablet (100 mg total) by mouth daily. 90 tablet 3   spironolactone (ALDACTONE) 25 MG tablet Take  25 mg by mouth daily. (Patient not taking: Reported on 04/04/2021)       Discharge Medications: Please see discharge summary for a list of discharge medications.  Relevant Imaging Results:  Relevant Lab Results:   Additional Information SS#: [id number]. COVID vaccines: Pfizer 05/16/19, 05/30/19. Unsure about booster.  Shelbie Hutching, RN

## 2021-04-05 NOTE — Evaluation (Signed)
Occupational Therapy Evaluation Patient Details Name: Steven Welch MRN: [id number] DOB: 1940-04-15 Today's Date: 04/05/2021   History of Present Illness presented to ER secondary to recurrent falls, progressive weakness and unsteady gait in home environment   Clinical Impression   Mr Alegria was seen for OT evaluation this date. Prior to hospital admission, pt was MOD I for mobility and ADLs including standing to shower. Pt lives alone with family available PRN. Pt presents to acute OT demonstrating impaired ADL performance and functional mobility 2/2 decreased activity tolerance and functional strength/ROM/balance deficits. Pt currently requires  MAX A don B socks at bed level. Requires BUE support in sitting/standing, anticipate MIN A grooming tasks 2/2 limited BUE ROM. MIN A + RW for ADL t/f, +2 for limes mgmt. Pt would benefit from skilled OT to address noted impairments and functional limitations (see below for any additional details). Upon hospital discharge, recommend STR to maximize pt safety and return to PLOF.       Recommendations for follow up therapy are one component of a multi-disciplinary discharge planning process, led by the attending physician.  Recommendations may be updated based on patient status, additional functional criteria and insurance authorization.   Follow Up Recommendations  Skilled nursing-short term rehab (<3 hours/day)    Assistance Recommended at Discharge Intermittent Supervision/Assistance  Patient can return home with the following A lot of help with walking and/or transfers;A lot of help with bathing/dressing/bathroom;Help with stairs or ramp for entrance    Functional Status Assessment  Patient has had a recent decline in their functional status and demonstrates the ability to make significant improvements in function in a reasonable and predictable amount of time.  Equipment Recommendations  BSC/3in1    Recommendations for Other Services        Precautions / Restrictions Precautions Precautions: Fall Restrictions Weight Bearing Restrictions: No      Mobility Bed Mobility Overal bed mobility: Needs Assistance Bed Mobility: Supine to Sit;Sit to Supine     Supine to sit: Min assist Sit to supine: Min guard   General bed mobility comments: min assist for truncal elevation; heavy use of momentum to assist with LE elevation into bed    Transfers Overall transfer level: Needs assistance Equipment used: Rolling walker (2 wheels) Transfers: Sit to/from Stand Sit to Stand: Min assist;+2 safety/equipment;From elevated surface           General transfer comment: from elevated stretcher height      Balance Overall balance assessment: Needs assistance Sitting-balance support: Feet supported;Bilateral upper extremity supported Sitting balance-Leahy Scale: Good     Standing balance support: Bilateral upper extremity supported Standing balance-Leahy Scale: Poor                             ADL either performed or assessed with clinical judgement   ADL Overall ADL's : Needs assistance/impaired                                       General ADL Comments: MAX A don B socks at bed level. Requires BUE support in sitting/standing, anticipate MIN A grooming tasks 2/2 limited BUE ROM      Pertinent Vitals/Pain Pain Assessment: Faces Faces Pain Scale: Hurts even more Pain Location: bilat shoulders, L > R Pain Descriptors / Indicators: Aching;Grimacing;Guarding Pain Intervention(s): Limited activity within patient's tolerance;Repositioned     Hand  Dominance     Extremity/Trunk Assessment Upper Extremity Assessment Upper Extremity Assessment: Generalized weakness (BUE shoulder flexion AROM limited >90*)   Lower Extremity Assessment Lower Extremity Assessment: Generalized weakness       Communication Communication Communication: No difficulties   Cognition Arousal/Alertness:  Awake/alert Behavior During Therapy: WFL for tasks assessed/performed Overall Cognitive Status: No family/caregiver present to determine baseline cognitive functioning                                 General Comments: Alert and oriented to self, location, situation, date; follows simple commands; eaily confused with multi-step commands and recall of more complex information at times     General Comments       Exercises Exercises: Other exercises Other Exercises Other Exercises: Pt educated re: OT role, DME recs. d/c recs, falls prevention   Shoulder Instructions      Home Living Family/patient expects to be discharged to:: Private residence Living Arrangements: Spouse/significant other Available Help at Discharge: Family;Available PRN/intermittently Type of Home: House Home Access: Stairs to enter   Entrance Stairs-Rails: None Home Layout: One level     Bathroom Shower/Tub: Walk-in shower         Home Equipment: Kasandra Knudsen - single point;Toilet riser   Additional Comments: Pt utilizes SPC in the R hand      Prior Functioning/Environment Prior Level of Function : Independent/Modified Independent             Mobility Comments: Sup/mod indep for household, limited community mobility with SPC as needed; does endorse multiple fall history. ADLs Comments: Wife assists with ADLs as needed, consistently with donning socks/shoes; has recently had aide assist 2x/week for bathing routine        OT Problem List: Decreased strength;Decreased range of motion;Decreased activity tolerance;Impaired balance (sitting and/or standing);Decreased safety awareness      OT Treatment/Interventions: Self-care/ADL training;Therapeutic exercise;Energy conservation;DME and/or AE instruction;Therapeutic activities;Patient/family education;Balance training    OT Goals(Current goals can be found in the care plan section) Acute Rehab OT Goals Patient Stated Goal: to feel better OT  Goal Formulation: With patient Time For Goal Achievement: 04/19/21 Potential to Achieve Goals: Good ADL Goals Pt Will Perform Grooming: with set-up;with supervision;sitting Pt Will Perform Lower Body Dressing: with min assist;sit to/from stand (c LRAD PRN) Pt Will Transfer to Toilet: with supervision;ambulating;regular height toilet (c LRAD PRN)  OT Frequency: Min 2X/week    Co-evaluation PT/OT/SLP Co-Evaluation/Treatment: Yes Reason for Co-Treatment: Necessary to address cognition/behavior during functional activity;For patient/therapist safety PT goals addressed during session: Mobility/safety with mobility OT goals addressed during session: ADL's and self-care      AM-PAC OT "6 Clicks" Daily Activity     Outcome Measure Help from another person eating meals?: A Little Help from another person taking care of personal grooming?: A Little Help from another person toileting, which includes using toliet, bedpan, or urinal?: A Little Help from another person bathing (including washing, rinsing, drying)?: A Lot Help from another person to put on and taking off regular upper body clothing?: A Little Help from another person to put on and taking off regular lower body clothing?: A Lot 6 Click Score: 16   End of Session Equipment Utilized During Treatment: Rolling walker (2 wheels);Gait belt  Activity Tolerance: Patient tolerated treatment well Patient left: in bed;with call bell/phone within reach  OT Visit Diagnosis: Other abnormalities of gait and mobility (R26.89);Muscle weakness (generalized) (M62.81)  Time: 1975-8832 OT Time Calculation (min): 21 min Charges:  OT General Charges $OT Visit: 1 Visit OT Evaluation $OT Eval Low Complexity: 1 Low  Dessie Coma, M.S. OTR/L  04/05/21, 1:30 PM  ascom [phone]

## 2021-04-05 NOTE — Evaluation (Signed)
Physical Therapy Evaluation Patient Details Name: Steven Welch MRN: [id number] DOB: 09/14/1940 Today's Date: 04/05/2021  History of Present Illness  presented to ER secondary to recurrent falls, progressive weakness and unsteady gait in home environment  Clinical Impression  Patient resting on stretcher upon arrival to room; alert and oriented to basic information, but does intermittently display some minor confusion with multi-step commands or recall of more complex information.  Endorses pain in bilat shouldlers, L > R; FACES 6/10.  Tolerates bilat shoulder elevation approx 70 degrees (limited by pain); elbows, wrists and hands grossly WFL; however, L UE with limited tolerance for resistance due to pain.  Bilat LEs grossly WFL; no focal weakness, sensory or coordination deficit appreciated.  Able to complete bed mobility with min assist; sit/stand, basic transfers and gait (66') with RW, min assist.  Demonstrates very choppy gait pattern with decreased stance time/loading R > L; poor balance reactions.  Unsafe to attempt without RW and +1 at this time.  Would benefit from skilled PT to address above deficits and promote optimal return to PLOF.; recommend transition to STR upon discharge from acute hospitalization.      Recommendations for follow up therapy are one component of a multi-disciplinary discharge planning process, led by the attending physician.  Recommendations may be updated based on patient status, additional functional criteria and insurance authorization.  Follow Up Recommendations Skilled nursing-short term rehab (<3 hours/day)    Assistance Recommended at Discharge Frequent or constant Supervision/Assistance  Patient can return home with the following  A little help with walking and/or transfers;A little help with bathing/dressing/bathroom;Assistance with cooking/housework;Help with stairs or ramp for entrance    Equipment Recommendations Rolling walker (2 wheels);BSC/3in1   Recommendations for Other Services       Functional Status Assessment Patient has had a recent decline in their functional status and demonstrates the ability to make significant improvements in function in a reasonable and predictable amount of time.     Precautions / Restrictions Precautions Precautions: Fall Restrictions Weight Bearing Restrictions: No      Mobility  Bed Mobility Overal bed mobility: Needs Assistance Bed Mobility: Supine to Sit;Sit to Supine     Supine to sit: Min assist Sit to supine: Min guard   General bed mobility comments: min assist for truncal elevation; heavy use of momentum to assist with LE elevation into bed    Transfers Overall transfer level: Needs assistance Equipment used: Rolling walker (2 wheels)               General transfer comment: cuing for hand placement; does report UE pain with WBing and movement transition    Ambulation/Gait Ambulation/Gait assistance: Min assist Gait Distance (Feet): 45 Feet Assistive device: Rolling walker (2 wheels)         General Gait Details: very choppy gait pattern with decreased stance time/loading R > L; poor balance reactions.  Unsafe to attempt without RW and +1 at this time.  Stairs            Wheelchair Mobility    Modified Rankin (Stroke Patients Only)       Balance Overall balance assessment: Needs assistance Sitting-balance support: No upper extremity supported;Feet supported Sitting balance-Leahy Scale: Good     Standing balance support: Bilateral upper extremity supported Standing balance-Leahy Scale: Poor                               Pertinent Vitals/Pain Pain Assessment:  Faces Faces Pain Scale: Hurts even more Pain Location: bilat shoulders, L > R Pain Descriptors / Indicators: Aching;Grimacing;Guarding Pain Intervention(s): Monitored during session;Repositioned;Limited activity within patient's tolerance    Home Living Family/patient  expects to be discharged to:: Private residence Living Arrangements: Spouse/significant other Available Help at Discharge: Family;Available PRN/intermittently Type of Home: House Home Access: Stairs to enter Entrance Stairs-Rails: None     Home Layout: One level Home Equipment: Cane - single point;Toilet riser Additional Comments: Pt utilizes SPC in the R hand    Prior Function Prior Level of Function : Independent/Modified Independent             Mobility Comments: Sup/mod indep for household, limited community mobility with SPC as needed; does endorse multiple fall history. ADLs Comments: Wife assists with ADLs as needed, consistently with donning socks/shoes; has recently had aide assist 2x/week for bathing routine     Hand Dominance        Extremity/Trunk Assessment   Upper Extremity Assessment Upper Extremity Assessment: Generalized weakness (grossly 3-/5, elevation limited approx 70-80 degrees due to bilat shoulder pain; elbow, wrist and hand grossly WFL)    Lower Extremity Assessment Lower Extremity Assessment: Generalized weakness (grossly 4-/5 throughout; no focal weakness appreciated)       Communication   Communication: No difficulties  Cognition Arousal/Alertness: Awake/alert Behavior During Therapy: WFL for tasks assessed/performed Overall Cognitive Status: No family/caregiver present to determine baseline cognitive functioning                                 General Comments: Alert and oriented to self, location, situation, date; follows simple commands; eaily confused with multi-step commands and recall of more complex information at times        General Comments      Exercises Other Exercises Other Exercises: Reviewed role of PT and progressive mobility; completed orthostatic vitals assessment (WFL, asymptomatic); reviewed safety/cuing for transfers and gait.  Patient voiced understanding; will reinforce throughout course of rehab as  needed.   Assessment/Plan    PT Assessment Patient needs continued PT services  PT Problem List Decreased strength;Decreased range of motion;Decreased activity tolerance;Decreased balance;Decreased mobility;Decreased cognition;Decreased knowledge of use of DME;Decreased safety awareness;Decreased knowledge of precautions       PT Treatment Interventions DME instruction;Gait training;Stair training;Functional mobility training;Therapeutic activities;Therapeutic exercise;Balance training;Patient/family education    PT Goals (Current goals can be found in the Care Plan section)  Acute Rehab PT Goals Patient Stated Goal: to get stronger and not fall PT Goal Formulation: With patient Time For Goal Achievement: 04/19/21 Potential to Achieve Goals: Good    Frequency Min 2X/week     Co-evaluation PT/OT/SLP Co-Evaluation/Treatment: Yes Reason for Co-Treatment: Complexity of the patient's impairments (multi-system involvement);For patient/therapist safety;To address functional/ADL transfers PT goals addressed during session: Mobility/safety with mobility;Balance OT goals addressed during session: ADL's and self-care       AM-PAC PT "6 Clicks" Mobility  Outcome Measure Help needed turning from your back to your side while in a flat bed without using bedrails?: None Help needed moving from lying on your back to sitting on the side of a flat bed without using bedrails?: A Little Help needed moving to and from a bed to a chair (including a wheelchair)?: A Little Help needed standing up from a chair using your arms (e.g., wheelchair or bedside chair)?: A Little Help needed to walk in hospital room?: A Little Help needed climbing 3-5  steps with a railing? : A Lot 6 Click Score: 18    End of Session Equipment Utilized During Treatment: Gait belt Activity Tolerance: Patient tolerated treatment well Patient left: in bed;with call bell/phone within reach Nurse Communication: Mobility  status PT Visit Diagnosis: Muscle weakness (generalized) (M62.81);Difficulty in walking, not elsewhere classified (R26.2);Pain Pain - Right/Left: Left Pain - part of body: Shoulder    Time: 1115-1141 PT Time Calculation (min) (ACUTE ONLY): 26 min   Charges:   PT Evaluation $PT Eval Moderate Complexity: 1 Mod          Sandford Diop H. Owens Shark, PT, DPT, NCS 04/05/21, 12:04 PM [phone]

## 2021-04-05 NOTE — TOC Initial Note (Signed)
Transition of Care [facility]) - Initial/Assessment Note    Patient Details  Name: Steven Welch MRN: [id number] Date of Birth: Sep 03, 1940  [facility] CM/SW Contact:    Shelbie Hutching, RN Phone Number: 04/05/2021, 4:55 PM  Clinical Narrative:                 Patient placed under observation for recurrent falls at home.  RNCM met with patient and patient's wife at the bedside.  Patient is from home with wife, he walks with a cane.  Wife provides transportation.  He is current with PCP.  He gets [facility] from [facility].   PT is recommending SNF and patient and family agree, he has been having [facility] come out for [facility] services but he fell 3 times the other day and family is worried about him getting hurt.  Bed search started.    Expected Discharge Plan: Skilled Nursing Facility Barriers to Discharge: Continued Medical Work up   Patient Goals and CMS Choice Patient states their goals for this hospitalization and ongoing recovery are:: Agrees to Short term rehab CMS Medicare.gov Compare Post Acute Care list provided to:: Patient Choice offered to / list presented to : Patient, Spouse  Expected Discharge Plan and Services Expected Discharge Plan: [place]   Discharge Planning Services: CM Consult Post Acute Care Choice: [place] Living arrangements for the past 2 months: Single Family Home                 DME Arranged: N/A DME Agency: NA       HH Arranged: NA HH Agency: NA        Prior Living Arrangements/Services Living arrangements for the past 2 months: Single Family Home Lives with:: Spouse Patient language and need for interpreter reviewed:: Yes Do you feel safe going back to the place where you live?: Yes      Need for Family Participation in Patient Care: Yes (Comment) Care giver support system in place?: Yes (comment) (wife) Current home services: DME (cane) Criminal Activity/Legal Involvement Pertinent to Current  Situation/Hospitalization: No - Comment as needed  Activities of Daily Living      Permission Sought/Granted Permission sought to share information with : Case Manager, Customer service manager, Family Supports Permission granted to share information with : Yes, Verbal Permission Granted  Share Information with NAME: Ki Meikle  Permission granted to share info w AGENCY: SNF's  Permission granted to share info w Relationship: wife  Permission granted to share info w Contact Information: [phone]  Emotional Assessment Appearance:: Appears stated age Attitude/Demeanor/Rapport: Engaged Affect (typically observed): Accepting Orientation: : Oriented to Self, Oriented to Place, Oriented to  Time, Oriented to Situation Alcohol / Substance Use: Not Applicable Psych Involvement: No (comment)  Admission diagnosis:  Chest pain [R07.9] Elevated troponin [R77.8] Patient Active Problem List   Diagnosis Date Noted   Elevated troponin 04/04/2021   Hypertension    Recurrent falls    Unsteady gait    Sepsis ([place]) 01/07/2021   Atrial fibrillation with rapid ventricular response ([place]) 01/07/2021   Status post total hip replacement, 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

## 2021-04-06 DIAGNOSIS — I4891 Unspecified atrial fibrillation: Secondary | ICD-10-CM | POA: Diagnosis not present

## 2021-04-06 DIAGNOSIS — I10 Essential (primary) hypertension: Secondary | ICD-10-CM

## 2021-04-06 DIAGNOSIS — R296 Repeated falls: Secondary | ICD-10-CM | POA: Diagnosis not present

## 2021-04-06 DIAGNOSIS — R778 Other specified abnormalities of plasma proteins: Secondary | ICD-10-CM | POA: Diagnosis not present

## 2021-04-06 LAB — CBC
HCT: 43.5 % (ref 39.0–52.0)
Hemoglobin: 14.4 g/dL (ref 13.0–17.0)
MCH: 30.9 pg (ref 26.0–34.0)
MCHC: 33.1 g/dL (ref 30.0–36.0)
MCV: 93.3 fL (ref 80.0–100.0)
Platelets: 208 10*3/uL (ref 150–400)
RBC: 4.66 MIL/uL (ref 4.22–5.81)
RDW: 12.7 % (ref 11.5–15.5)
WBC: 8.3 10*3/uL (ref 4.0–10.5)
nRBC: 0 % (ref 0.0–0.2)

## 2021-04-06 LAB — BASIC METABOLIC PANEL
Anion gap: 8 (ref 5–15)
BUN: 15 mg/dL (ref 8–23)
CO2: 27 mmol/L (ref 22–32)
Calcium: 8.7 mg/dL — ABNORMAL LOW (ref 8.9–10.3)
Chloride: 104 mmol/L (ref 98–111)
Creatinine, Ser: 0.72 mg/dL (ref 0.61–1.24)
GFR, Estimated: >60 mL/min (ref >60)
Glucose, Bld: 103 mg/dL — ABNORMAL HIGH (ref 70–99)
Potassium: 3.5 mmol/L (ref 3.5–5.1)
Sodium: 139 mmol/L (ref 135–145)

## 2021-04-06 LAB — PHOSPHORUS: Phosphorus: 3.1 mg/dL (ref 2.5–4.6)

## 2021-04-06 LAB — MAGNESIUM: Magnesium: 1.9 mg/dL (ref 1.7–2.4)

## 2021-04-06 MED ORDER — HYDRALAZINE HCL 25 MG PO TABS
25.0000 mg | ORAL_TABLET | Freq: Four times a day (QID) | ORAL | Status: DC | PRN
  Administered 2021-04-06 – 2021-04-07 (×3): 25 mg via ORAL
  Filled 2021-04-06 (×3): qty 1

## 2021-04-06 MED ORDER — SPIRONOLACTONE 25 MG PO TABS
25.0000 mg | ORAL_TABLET | Freq: Every day | ORAL | Status: DC
  Administered 2021-04-06 – 2021-04-07 (×2): 25 mg via ORAL
  Filled 2021-04-06 (×2): qty 1

## 2021-04-06 MED ORDER — LOSARTAN POTASSIUM 50 MG PO TABS
50.0000 mg | ORAL_TABLET | Freq: Every day | ORAL | Status: DC
  Administered 2021-04-06 – 2021-04-07 (×2): 50 mg via ORAL
  Filled 2021-04-06 (×2): qty 1

## 2021-04-06 NOTE — TOC Progression Note (Signed)
Transition of Care [facility]) - Progression Note    Patient Details  Name: Steven Welch MRN: [id number] Date of Birth: Jun 12, 1940  Transition of Care [facility]) CM/SW Contact  Shelbie Hutching, RN Phone Number: 04/06/2021, 3:41 PM  Clinical Narrative:    Bed offers presented to patient and family for rehab.  Family chooses [facility].  [facility] can accept patient tomorrow.  RNCM has started insurance authorization.    Expected Discharge Plan: [place] Barriers to Discharge: Continued Medical Work up  Expected Discharge Plan and Services Expected Discharge Plan: [place]   Discharge Planning Services: CM Consult Post Acute Care Choice: [place] Living arrangements for the past 2 months: Single Family Home                 DME Arranged: N/A DME Agency: NA       HH Arranged: NA HH Agency: NA         Social Determinants of Health (SDOH) Interventions    Readmission Risk Interventions No flowsheet data found.

## 2021-04-06 NOTE — Progress Notes (Signed)
[facility]  PROGRESS NOTE  Steven Welch GDJ:[id number] DOB: [birth year] DOA: 04/03/2021 PCP: Juluis Pitch, MD   Brief HPI:   81 year old male with medical history of chronic physical deconditioning, atrial fibrillation on Eliquis, diagnosed in 10/22, HFpEF, hypertension presented to ED with complaints of unsteady gait resulting in falls at home over the past few days.  Denies preceding lightheadedness, no visual disturbance, one-sided weakness numbness or tingling.  No nausea vomiting or diarrhea.  Denies decreased oral intake or recent medication change Of note, patient was hospitalized in October 2022 and SNF was recommended due to deconditioning but patient declined.  He was started on midodrine at the time because of low blood pressure, however at a follow-up appointment with cardiology on 12/5, losartan 100 mg was started .   Subjective   Patient seen and examined, denies any complaints.   Assessment/Plan:    Recurrent falls -Patient has unsteady gait/physical deconditioning -MRI/MRA were negative for acute abnormality -Echocardiogram in October 2022 was normal -Suspect medication induced, patient previously was on midodrine in October due to low blood pressure -Antihypertensive medications including Aldactone 25 mg daily and losartan are on hold  Hypertension -Patient was started on Cardizem 30 mg p.o. every 6 hours for hypotension and tachycardia/bradycardia -Blood pressure is not adequately controlled and he continues to have bradycardia with heart rate in upper 40s and low 50s -We will discontinue Cardizem -Start back on Aldactone 25 mg daily -Losartan at reduced dose of 50 mg daily -Also add hydralazine 25 mg p.o. every 6 hours as needed for BP greater than 160/100 -Monitor patient blood pressure and obtain orthostatic vital signs  Elevated troponin -Likely from demand ischemia from hypertension -Troponins downtrending, patient denies chest pain -Cardiology  consulted, no further work-up recommended  Atrial fibrillation -Patient is not on any rate controlling medications at home -Continue apixaban for anticoagulation -He was started on Cardizem yesterday, but now has bradycardia -Cardizem has been discontinued as above -Will monitor, patient was not started on beta-blocker as previously unable to tolerate beta-blockers  Iron-deficiency anemia -Iron saturation 30% -Started on iron supplementation  Vitamin D deficiency -Started on 50,000 units vitamin D every 7 days for 8 doses   Medications     apixaban  5 mg Oral BID   vitamin C  500 mg Oral Daily   diltiazem  30 mg Oral Q6H   iron polysaccharides  150 mg Oral Daily   Vitamin D (Ergocalciferol)  50,000 Units Oral Q7 days     Data Reviewed:   CBG:  No results for input(s): GLUCAP in the last 168 hours.  SpO2: 94 %    Vitals:   04/06/21 0430 04/06/21 0500 04/06/21 0530 04/06/21 0600  BP: (!) 190/91 (!) 174/86 (!) 169/80 (!) 175/92  Pulse: (!) 53 (!) 49 (!) 48 (!) 51  Resp: 17 18 20 20   Temp:      TempSrc:      SpO2: 95% 97% 95% 94%  Weight:        No intake or output data in the 24 hours ending 04/06/21 1131  No intake/output data recorded.  Filed Weights   04/05/21 1200  Weight: 92.1 kg    Data Reviewed: Basic Metabolic Panel: Recent Labs  Lab 04/03/21 1905 04/04/21 1126 04/05/21 0722 04/06/21 0616  NA 135 139 137 139  K 3.6 3.5 4.1 3.5  CL 102 105 102 104  CO2 25 29 30 27   GLUCOSE 130* 103* 105* 103*  BUN 17 18 22  15  CREATININE 0.85 0.89 1.07 0.72  CALCIUM 9.0 8.3* 8.4* 8.7*  MG 1.7 1.8 2.0 1.9  PHOS  --  2.5 3.4 3.1   Liver Function Tests: Recent Labs  Lab 04/03/21 1905  AST 18  ALT 10  ALKPHOS 52  BILITOT 0.9  PROT 6.6  ALBUMIN 3.3*   No results for input(s): LIPASE, AMYLASE in the last 168 hours. No results for input(s): AMMONIA in the last 168 hours. CBC: Recent Labs  Lab 04/03/21 1905 04/04/21 1126 04/05/21 0722  04/06/21 0616  WBC 11.6* 9.4 7.2 8.3  NEUTROABS 9.6*  --   --   --   HGB 15.0 14.1 14.1 14.4  HCT 45.4 43.2 43.6 43.5  MCV 93.8 96.2 95.2 93.3  PLT 221 185 198 208   Cardiac Enzymes: No results for input(s): CKTOTAL, CKMB, CKMBINDEX, TROPONINI in the last 168 hours. BNP (last 3 results) No results for input(s): BNP in the last 8760 hours.  ProBNP (last 3 results) No results for input(s): PROBNP in the last 8760 hours.  CBG: No results for input(s): GLUCAP in the last 168 hours.     Radiology Reports  DG Shoulder Left  Result Date: 04/05/2021 CLINICAL DATA:  Left shoulder pain after fall EXAM: LEFT SHOULDER - 2+ VIEW COMPARISON:  None. FINDINGS: There is no evidence of fracture or dislocation. 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 Mild-to-moderate AC joint arthropathy. Soft tissues are unremarkable. IMPRESSION: No acute fracture or dislocation. 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 Electronically Signed   By: Davina Poke D.O.   On: 04/05/2021 13:57       Antibiotics: Anti-infectives (From admission, onward)    Start     Dose/Rate Route Frequency Ordered Stop   04/04/21 0000  levofloxacin (LEVAQUIN) 750 MG tablet        750 mg Oral Daily 04/04/21 0126 04/10/21 2359         DVT prophylaxis: Apixaban  Code Status: Full code  Family Communication: Daughter at bedside   Consultants:   Procedures:     Objective    Physical Examination:   General-appears in no acute distress Heart-S1-S2, regular, no murmur auscultated Lungs-clear to auscultation bilaterally, no wheezing or crackles auscultated Abdomen-soft, nontender, no organomegaly Extremities-no edema in the lower extremities Neuro-alert, oriented x3, no focal deficit noted  Status is: Inpatient  Dispo: The patient is from: Home              Anticipated d/c is to: Skilled nursing facility              Anticipated d/c date is: 04/08/2021               Patient currently not stable for discharge  Barrier to discharge-awaiting bed at skilled nursing facility  COVID-19 Labs  No results for input(s): DDIMER, FERRITIN, LDH, CRP in the last 72 hours.  Lab Results  Component Value Date   SARSCOV2NAA NEGATIVE 04/04/2021   [place] NEGATIVE 01/07/2021   [place] NEGATIVE 12/28/2018            Recent Results (from the past 240 hour(s))  Resp Panel by RT-PCR (Flu A&B, Covid) Nasopharyngeal Swab     Status: None   Collection Time: 04/04/21  6:03 AM   Specimen: Nasopharyngeal Swab; Nasopharyngeal(NP) swabs in vial transport medium  Result Value Ref Range Status   SARS Coronavirus 2 by RT PCR NEGATIVE NEGATIVE Final    Comment: (NOTE) SARS-CoV-2 target nucleic acids are NOT DETECTED.  The  SARS-CoV-2 RNA is generally detectable in upper respiratory specimens during the acute phase of infection. The lowest concentration of SARS-CoV-2 viral copies this assay can detect is 138 copies/mL. A negative result does not preclude SARS-Cov-2 infection and should not be used as the sole basis for treatment or other patient management decisions. A negative result may occur with  improper specimen collection/handling, submission of specimen other than nasopharyngeal swab, presence of viral mutation(s) within the areas targeted by this assay, and inadequate number of viral copies(<138 copies/mL). A negative result must be combined with clinical observations, patient history, and epidemiological information. The expected result is Negative.  Fact Sheet for Patients:  EntrepreneurPulse.com.au  Fact Sheet for Healthcare Providers:  IncredibleEmployment.be  This test is no t yet approved or cleared by the [place] FDA and  has been authorized for detection and/or diagnosis of SARS-CoV-2 by FDA under an Emergency Use Authorization (EUA). This EUA will remain  in effect (meaning this test can be used)  for the duration of the COVID-19 declaration under Section 564(b)(1) of the Act, 21 U.S.C.section 360bbb-3(b)(1), unless the authorization is terminated  or revoked sooner.       Influenza A by PCR NEGATIVE NEGATIVE Final   Influenza B by PCR NEGATIVE NEGATIVE Final    Comment: (NOTE) The Xpert Xpress SARS-CoV-2/FLU/RSV plus assay is intended as an aid in the diagnosis of influenza from Nasopharyngeal swab specimens and should not be used as a sole basis for treatment. Nasal washings and aspirates are unacceptable for Xpert Xpress SARS-CoV-2/FLU/RSV testing.  Fact Sheet for Patients: EntrepreneurPulse.com.au  Fact Sheet for Healthcare Providers: IncredibleEmployment.be  This test is not yet approved or cleared by the [place] FDA and has been authorized for detection and/or diagnosis of SARS-CoV-2 by FDA under an Emergency Use Authorization (EUA). This EUA will remain in effect (meaning this test can be used) for the duration of the COVID-19 declaration under Section 564(b)(1) of the Act, 21 U.S.C. section 360bbb-3(b)(1), unless the authorization is terminated or revoked.  Performed at [facility], [address], [place], [place] [address]     [facility] If 7PM-7AM, please contact night-coverage at [email or web address], Office  [phone]   04/06/2021, 11:31 AM  LOS: 0 days

## 2021-04-06 NOTE — Progress Notes (Addendum)
Physical Therapy Treatment Patient Details Name: Steven Welch MRN: [id number] DOB: November 28, 1940 Today's Date: 04/06/2021   History of Present Illness presented to ER secondary to recurrent falls, progressive weakness and unsteady gait in home environment    PT Comments    Patient agreeable to PT. He continues to require assistance with mobility. Multiple standing bouts performed from bed and from bed side commode. Patient needs increased assistance standing from a lower surface and had a posterior lean. Ambulation distance limited by need to have bowel movement. He continues to require assistance for steadying with rolling walker while ambulating. Recommend to continue PT to maximize independence and facilitate return to prior level of function. SNF recommended at discharge.    Recommendations for follow up therapy are one component of a multi-disciplinary discharge planning process, led by the attending physician.  Recommendations may be updated based on patient status, additional functional criteria and insurance authorization.  Follow Up Recommendations  Skilled nursing-short term rehab (<3 hours/day)     Assistance Recommended at Discharge Frequent or constant Supervision/Assistance  Patient can return home with the following A little help with walking and/or transfers;A little help with bathing/dressing/bathroom;Assistance with cooking/housework;Help with stairs or ramp for entrance   Equipment Recommendations  Rolling walker (2 wheels);BSC/3in1    Recommendations for Other Services       Precautions / Restrictions Precautions Precautions: Fall Restrictions Weight Bearing Restrictions: No     Mobility  Bed Mobility Overal bed mobility: Needs Assistance Bed Mobility: Supine to Sit;Sit to Supine     Supine to sit: Min assist Sit to supine: Min assist   General bed mobility comments: assistance for trunk support to sit upright. assistance for LE support to return to bed.  cues for technique    Transfers Overall transfer level: Needs assistance Equipment used: Rolling walker (2 wheels) Transfers: Sit to/from Stand;Bed to chair/wheelchair/BSC Sit to Stand: Min assist;From elevated surface     Step pivot transfers: Mod assist     General transfer comment: from elevated stretcher height, patient required only minimal assistance for steadying. with standing from bed side commode, Mod A required due to posterior pushing with transfer. cues for hand placement and technique. multiple standing bouts performed during session    Ambulation/Gait Ambulation/Gait assistance: Min guard Gait Distance (Feet): 20 Feet Assistive device: Rolling walker (2 wheels) Gait Pattern/deviations: Narrow base of support;Decreased stride length Gait velocity: decreased     General Gait Details: cues for technique using rolling walker with steadying assistance provided. gait distance limited by need to have a bowel movement with ambulation   Stairs             Wheelchair Mobility    Modified Rankin (Stroke Patients Only)       Balance Overall balance assessment: Needs assistance Sitting-balance support: Feet supported;Bilateral upper extremity supported Sitting balance-Leahy Scale: Good     Standing balance support: Bilateral upper extremity supported Standing balance-Leahy Scale: Poor                              Cognition Arousal/Alertness: Awake/alert Behavior During Therapy: WFL for tasks assessed/performed Overall Cognitive Status: No family/caregiver present to determine baseline cognitive functioning                                 General Comments: patient able to follow single step commands with increased time  Exercises      General Comments General comments (skin integrity, edema, etc.): encouraged patient to use rolling walker instead of his cane for safety with ambulation and fall prevention, but he will  need a staff member assistance at this time for fall prevention. of note, patient did have a bowel movement during session and required maximal assistance for peri-care afterwards      Pertinent Vitals/Pain Pain Assessment: Faces Faces Pain Scale: Hurts a little bit Pain Location: neck Pain Descriptors / Indicators: Sore Pain Intervention(s): Limited activity within patient's tolerance;Monitored during session;Repositioned    Home Living                          Prior Function            PT Goals (current goals can now be found in the care plan section) Acute Rehab PT Goals Patient Stated Goal: to get stronger and not fall PT Goal Formulation: With patient Time For Goal Achievement: 04/19/21 Potential to Achieve Goals: Good Progress towards PT goals: Progressing toward goals    Frequency    Min 2X/week      PT Plan Current plan remains appropriate    Co-evaluation              AM-PAC PT "6 Clicks" Mobility   Outcome Measure  Help needed turning from your back to your side while in a flat bed without using bedrails?: None Help needed moving from lying on your back to sitting on the side of a flat bed without using bedrails?: A Lot Help needed moving to and from a bed to a chair (including a wheelchair)?: A Little Help needed standing up from a chair using your arms (e.g., wheelchair or bedside chair)?: A Little Help needed to walk in hospital room?: A Little Help needed climbing 3-5 steps with a railing? : A Lot 6 Click Score: 17    End of Session Equipment Utilized During Treatment: Gait belt Activity Tolerance: Patient tolerated treatment well Patient left: in bed;with call bell/phone within reach Nurse Communication: Mobility status PT Visit Diagnosis: Muscle weakness (generalized) (M62.81);Difficulty in walking, not elsewhere classified (R26.2)     Time: 3154-0086 PT Time Calculation (min) (ACUTE ONLY): 31 min  Charges:  $Gait Training:  8-22 mins $Therapeutic Activity: 8-22 mins                     Minna Merritts, PT, MPT    Percell Locus 04/06/2021, 12:16 PM

## 2021-04-06 NOTE — Progress Notes (Signed)
°   04/06/21 1150  Clinical Encounter Type  Visited With Patient  Visit Type Initial;Social support;Spiritual support   Chaplain Burris f/u on spiritual care consult request. Able to have conversation with Pt by himself who did not disclose or may not understand potential changes in his plan of care. Will recommend family for f/u support or page on-call chaplain as needed.

## 2021-04-07 DIAGNOSIS — K219 Gastro-esophageal reflux disease without esophagitis: Secondary | ICD-10-CM | POA: Diagnosis present

## 2021-04-07 DIAGNOSIS — R296 Repeated falls: Secondary | ICD-10-CM | POA: Diagnosis not present

## 2021-04-07 LAB — BASIC METABOLIC PANEL
Anion gap: 6 (ref 5–15)
BUN: 11 mg/dL (ref 8–23)
CO2: 29 mmol/L (ref 22–32)
Calcium: 8.6 mg/dL — ABNORMAL LOW (ref 8.9–10.3)
Chloride: 106 mmol/L (ref 98–111)
Creatinine, Ser: 0.7 mg/dL (ref 0.61–1.24)
GFR, Estimated: >60 mL/min (ref >60)
Glucose, Bld: 100 mg/dL — ABNORMAL HIGH (ref 70–99)
Potassium: 3.5 mmol/L (ref 3.5–5.1)
Sodium: 141 mmol/L (ref 135–145)

## 2021-04-07 LAB — CBC
HCT: 42.9 % (ref 39.0–52.0)
Hemoglobin: 14.4 g/dL (ref 13.0–17.0)
MCH: 31.6 pg (ref 26.0–34.0)
MCHC: 33.6 g/dL (ref 30.0–36.0)
MCV: 94.3 fL (ref 80.0–100.0)
Platelets: 234 10*3/uL (ref 150–400)
RBC: 4.55 MIL/uL (ref 4.22–5.81)
RDW: 12.6 % (ref 11.5–15.5)
WBC: 7.9 10*3/uL (ref 4.0–10.5)
nRBC: 0 % (ref 0.0–0.2)

## 2021-04-07 LAB — RESP PANEL BY RT-PCR (FLU A&B, COVID) ARPGX2
Influenza A by PCR: NEGATIVE
Influenza B by PCR: NEGATIVE
SARS Coronavirus 2 by RT PCR: NEGATIVE

## 2021-04-07 LAB — PHOSPHORUS: Phosphorus: 3 mg/dL (ref 2.5–4.6)

## 2021-04-07 LAB — MAGNESIUM: Magnesium: 1.9 mg/dL (ref 1.7–2.4)

## 2021-04-07 MED ORDER — POTASSIUM CHLORIDE CRYS ER 20 MEQ PO TBCR
20.0000 meq | EXTENDED_RELEASE_TABLET | Freq: Once | ORAL | Status: AC
  Administered 2021-04-07: 20 meq via ORAL
  Filled 2021-04-07: qty 1

## 2021-04-07 MED ORDER — LOSARTAN POTASSIUM 50 MG PO TABS: 50.0000 mg | ORAL_TABLET | Freq: Every day | ORAL | 0 refills | Status: DC

## 2021-04-07 MED ORDER — VITAMIN D (ERGOCALCIFEROL) 1.25 MG (50000 UNIT) PO CAPS
50000.0000 [IU] | ORAL_CAPSULE | ORAL | 0 refills | Status: AC
Start: 2021-04-12 — End: 2021-05-11

## 2021-04-07 MED ORDER — ASCORBIC ACID 500 MG PO TABS: 500.0000 mg | ORAL_TABLET | Freq: Every day | ORAL | 0 refills | Status: DC

## 2021-04-07 MED ORDER — HYDRALAZINE HCL 25 MG PO TABS: 25.0000 mg | ORAL_TABLET | Freq: Four times a day (QID) | ORAL | 0 refills | Status: DC | PRN

## 2021-04-07 MED ORDER — VITAMIN B-12 1000 MCG PO TABS
1000.0000 ug | ORAL_TABLET | Freq: Every day | ORAL | Status: DC
  Administered 2021-04-07: 1000 ug via ORAL
  Filled 2021-04-07 (×2): qty 1

## 2021-04-07 MED ORDER — DICLOFENAC SODIUM 1 % EX GEL: 2.0000 g | Freq: Four times a day (QID) | CUTANEOUS | 0 refills | Status: DC

## 2021-04-07 MED ORDER — DICLOFENAC SODIUM 1 % EX GEL
2.0000 g | Freq: Four times a day (QID) | CUTANEOUS | Status: DC
  Filled 2021-04-07: qty 100

## 2021-04-07 MED ORDER — POLYSACCHARIDE IRON COMPLEX 150 MG PO CAPS: 150.0000 mg | ORAL_CAPSULE | Freq: Every day | ORAL | 0 refills | Status: DC

## 2021-04-07 MED ORDER — CYANOCOBALAMIN 1000 MCG PO TABS: 1000.0000 ug | ORAL_TABLET | Freq: Every day | ORAL | 0 refills | Status: DC

## 2021-04-07 NOTE — Progress Notes (Signed)
Report called to chelsea at [facility].

## 2021-04-07 NOTE — Progress Notes (Signed)
Occupational Therapy Treatment Patient Details Name: Steven Welch MRN: [id number] DOB: [birth year] Today's Date: 04/07/2021   History of present illness presented to ER secondary to recurrent falls, progressive weakness and unsteady gait in home environment   OT comments  Pt seen for OT tx this date to f/u re: safety with ADLs/ADL mobility. Pt's spouse and daughter present in room throughout session. OT engages pt and family in ed re: importance of core strengthening and posterior chain strengthening for fall prevention. Engaged pt in seated postural extension with scap retraction x10, standing gluteal contractions x10 and seated abdominal contraction x10 to improve core strength and control for fall prevention. In addition, engaged pt in static standing balance tasks including engaging pt in event distribution of weight throughout all points of his feet to improve use of base of support rather than keeping all weight in heels as he's noted to do on initial CTS. Pt tolerates all aspects of session well and education completed wtih family members as well re: engaging pt in gentle encouragement to complete these exercises outside of therapy time for continuity/carryover. Pt tolerates 2 standing bouts with MIN A this session with RW for UE support (cues for safe use). Pt left in bed with alarm set and all needs met at end of session. Will continue to follow acutely.    Recommendations for follow up therapy are one component of a multi-disciplinary discharge planning process, led by the attending physician.  Recommendations may be updated based on patient status, additional functional criteria and insurance authorization.    Follow Up Recommendations  Skilled nursing-short term rehab (<3 hours/day)    Assistance Recommended at Discharge Intermittent Supervision/Assistance  Patient can return home with the following  A lot of help with walking and/or transfers;A lot of help with  bathing/dressing/bathroom;Help with stairs or ramp for entrance   Equipment Recommendations  BSC/3in1    Recommendations for Other Services      Precautions / Restrictions Precautions Precautions: Fall Restrictions Weight Bearing Restrictions: No       Mobility Bed Mobility Overal bed mobility: Needs Assistance Bed Mobility: Supine to Sit;Sit to Supine     Supine to sit: Min assist Sit to supine: Min assist   General bed mobility comments: assist for trunk, 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    Transfers Overall transfer level: Needs assistance Equipment used: Rolling walker (2 wheels) Transfers: Sit to/from Stand Sit to Stand: Min assist;From elevated surface           General transfer comment: bed elevated ~2-3", he requries increased time, cues for hand placement, is noted to retropulse with weight in heels and haunches-ed re: contracting glutes and evenly dispersing weight through all points of base of support rathen than heels for fall prevention     Balance Overall balance assessment: Needs assistance Sitting-balance support: Feet supported;Bilateral upper extremity supported Sitting balance-Leahy Scale: Fair Sitting balance - Comments: F static sitting, but P dynamic sitting for LB ADLs such as donning socks and noted P dynamic sitting balance with lateral scooting towards HOB, noted to lose balance posteriorly w/o UE support or external support from therapist.   Standing balance support: Bilateral upper extremity supported Standing balance-Leahy Scale: Poor Standing balance comment: can improve to F balance temporarily with cues to contract glutes and abdominals, but mostly P balance with slight sway noted.  ADL either performed or assessed with clinical judgement   ADL Overall ADL's : Needs assistance/impaired                     Lower Body Dressing: Maximal assistance;Sitting/lateral leans Lower  Body Dressing Details (indicate cue type and reason): socks d/t F static sitting/P dynamic sitting (weakened core)                    Extremity/Trunk Assessment              Vision       Perception     Praxis      Cognition Arousal/Alertness: Awake/alert Behavior During Therapy: WFL for tasks assessed/performed Overall Cognitive Status: No family/caregiver present to determine baseline cognitive functioning                                 General Comments: patient able to follow single step commands with increased time, somewhat slower processing, require direct communication/eye contact to respond conversationally.          Exercises Other Exercises Other Exercises: OT engages pt and family in ed re: importance of core strengthening and posterior chain strengthening for fall prevention. Engaged pt in seated postural extension with scap retraction x10, standing gluteal contractions x10 and seated abdominal contraction x10 to improve core strength and control for fall prevention. In addition, engaged pt in static standing balance tasks including engaging pt in event distribution of weight throughout all points of his feet to improve use of base of support rather than keeping all weight in heels as he's noted to do on initial CTS. Pt tolerates all aspects of session well and education completed wtih family members as well re: engaging pt in gentle encouragement to complete these exercises outside of therapy time for continuity/carryover.   Shoulder Instructions       General Comments      Pertinent Vitals/ Pain       Pain Assessment: Faces Faces Pain Scale: Hurts a little bit Pain Location: neck (chronic) Pain Descriptors / Indicators: Sore Pain Intervention(s): Monitored during session  Home Living                                          Prior Functioning/Environment              Frequency  Min 2X/week        Progress  Toward Goals  OT Goals(current goals can now be found in the care plan section)  Progress towards OT goals: Progressing toward goals  Acute Rehab OT Goals Patient Stated Goal: to feel better OT Goal Formulation: With patient/family Time For Goal Achievement: 04/19/21 Potential to Achieve Goals: Good  Plan Discharge plan remains appropriate    Co-evaluation                 AM-PAC OT "6 Clicks" Daily Activity     Outcome Measure   Help from another person eating meals?: A Little Help from another person taking care of personal grooming?: A Little Help from another person toileting, which includes using toliet, bedpan, or urinal?: A Little Help from another person bathing (including washing, rinsing, drying)?: A Lot Help from another person to put on and taking off regular upper body clothing?: A Little Help from another person  to put on and taking off regular lower body clothing?: A Lot 6 Click Score: 16    End of Session Equipment Utilized During Treatment: Rolling walker (2 wheels);Gait belt  OT Visit Diagnosis: Other abnormalities of gait and mobility (R26.89);Muscle weakness (generalized) (M62.81)   Activity Tolerance Patient tolerated treatment well   Patient Left in bed;with call bell/phone within reach   Nurse Communication Mobility status        Time: 2500-3704 OT Time Calculation (min): 19 min  Charges: OT General Charges $OT Visit: 1 Visit OT Treatments $Therapeutic Activity: 8-22 mins  Gerrianne Scale, [place], OTR/L ascom [phone] 04/07/21, 11:38 AM

## 2021-04-07 NOTE — Discharge Summary (Signed)
Physician Discharge Summary  Steven Welch ELF:[id number] DOB: 08/03/1940 DOA: 04/03/2021  PCP: Juluis Pitch, MD  Admit date: 04/03/2021 Discharge date: 04/07/2021  Admitted From: Home  Disposition:  SNF for rehab.   Recommendations for Outpatient Follow-up:  Follow up with PCP in 1-2 weeks Please obtain BMP/CBC in one week Needs repeat B 12 and vitamin level in 4 weeks.  Adjust BP medications as needed, avoid Hypotension.  Needs to work with PT>    Discharge Condition: Stable.  CODE STATUS: Full Code Diet recommendation: Heart Healthy  Brief/Interim Summary: 81 year old with past medical history significant for chronic physical deconditioning, A. fib on Eliquis, diagnosed in 10/22, heart failure preserved ejection fraction, hypertension presents to the ED complaining of unsteady gait resulting in falls at home over the past few days.  He denies preceding lightheadedness, no visual disturbance, no focal weakness.  Denies nausea vomiting and diarrhea.  No recent medication changes or poor oral intake.  Patient has a prior history of low blood pressure and was on midodrine in October 2022.  On subsequent follow-up appointment with his cardiologist his blood pressure was elevated and he was a started on losartan.  Patient will be discharged to skilled nursing facility for rehabilitation, he was a started on vitamin B12 and vitamin D supplementation.  Initially his blood pressure was held, blood pressure was soft.  Subsequently his blood pressure increased and he was restarted on spironolactone and losartan.   1-Recurrent Fall, Deconditioning: Patient has unsteady gait/physical deconditioning MRI/MRA negative for acute abnormality Suspect related to vitamin deficiency and medications, his blood pressure medication has been adjusted.  He initially had soft low  BP on admission He will be discharged to skilled nursing facility for rehab  2-Hypertension: Patient was initially started on  Cardizem for hypertension but due to tacky bradycardia this was discontinued. He is back on a spironolactone and losartan He will be discharged on as needed hydralazine.  Avoid hypotension  3-A. fib: Unable to tolerate rate control medication due to bradycardia Continue with Eliquis  4-Iron deficiency anemia: Started on iron supplement 5-Vitamin D deficiency: Restarted on vitamin D supplement 50,000 units every 7 days 6-Low normal B12: He will be started on supplement.  This might help with abnormal gait Shoulder pain; start diclofenac gel.   Discharge Diagnoses:  Principal Problem:   Recurrent falls Active Problems:   Atrial fibrillation with rapid ventricular response (HCC)   Hypertension   Elevated troponin   Unsteady gait    Discharge Instructions  Discharge Instructions     Diet - low sodium heart healthy   Complete by: As directed    Increase activity slowly   Complete by: As directed       Allergies as of 04/07/2021   No Known Allergies      Medication List     TAKE these medications    acetaminophen 500 MG tablet Commonly known as: TYLENOL Take 500 mg by mouth 2 (two) times daily.   apixaban 5 MG Tabs tablet Commonly known as: ELIQUIS Take 1 tablet (5 mg total) by mouth 2 (two) times daily.   ascorbic acid 500 MG tablet Commonly known as: VITAMIN C Take 1 tablet (500 mg total) by mouth daily. Start taking on: April 08, 2021   benzonatate 100 MG capsule Commonly known as: Best boy Take 1 capsule (100 mg total) by mouth 3 (three) times daily as needed for cough.   cyanocobalamin 1000 MCG tablet Take 1 tablet (1,000 mcg total) by mouth daily.  diclofenac Sodium 1 % Gel Commonly known as: VOLTAREN Apply 2 g topically 4 (four) times daily.   hydrALAZINE 25 MG tablet Commonly known as: APRESOLINE Take 1 tablet (25 mg total) by mouth every 6 (six) hours as needed (BP > 160/100).   iron polysaccharides 150 MG capsule Commonly known as:  NIFEREX Take 1 capsule (150 mg total) by mouth daily.   losartan 50 MG tablet Commonly known as: COZAAR Take 1 tablet (50 mg total) by mouth daily. Start taking on: April 08, 2021 What changed:  medication strength how much to take   spironolactone 25 MG tablet Commonly known as: ALDACTONE Take 25 mg by mouth daily.   Vitamin D (Ergocalciferol) 1.25 MG (50000 UNIT) Caps capsule Commonly known as: DRISDOL Take 1 capsule (50,000 Units total) by mouth every 7 (seven) days for 5 doses. Start taking on: April 12, 2021        Contact information for follow-up providers     Grayland Ormond, Kathlene November, MD.   Specialty: Oncology Why: for a follow up appointment Contact information: [place] [address] [phone]              Contact information for after-discharge care     [facility] .   Service: Skilled Nursing Contact information: [place] [phone]                    No Known Allergies  Consultations:   Procedures/Studies: DG Chest 2 View  Result Date: 04/03/2021 CLINICAL DATA:  Weakness, multiple falls EXAM: CHEST - 2 VIEW COMPARISON:  01/07/2021 FINDINGS: Heart is normal size. No confluent airspace opacities or effusions. No acute bony abnormality. IMPRESSION: No active cardiopulmonary disease. Electronically Signed   By: Rolm Baptise M.D.   On: 04/03/2021 19:32   CT HEAD WO CONTRAST (5MM)  Result Date: 04/03/2021 CLINICAL DATA:  Multiple falls, altered mental status. Dizziness, nonspecific EXAM: CT HEAD WITHOUT CONTRAST TECHNIQUE: Contiguous axial images were obtained from the base of the skull through the vertex without intravenous contrast. COMPARISON:  01/07/2021 FINDINGS: Brain: There is atrophy and chronic small vessel disease changes. No acute intracranial abnormality.  Specifically, no hemorrhage, hydrocephalus, mass lesion, acute infarction, or significant intracranial injury. Vascular: No hyperdense vessel or unexpected calcification. Skull: No acute calvarial abnormality. Sinuses/Orbits: No acute findings Other: None IMPRESSION: Atrophy, chronic microvascular disease. No acute intracranial abnormality. Electronically Signed   By: Rolm Baptise M.D.   On: 04/03/2021 19:33   CT Cervical Spine Wo Contrast  Result Date: 04/03/2021 CLINICAL DATA:  Neck trauma (Age >= 65y).  Multiple falls. EXAM: CT CERVICAL SPINE WITHOUT CONTRAST TECHNIQUE: Multidetector CT imaging of the cervical spine was performed without intravenous contrast. Multiplanar CT image reconstructions were also generated. COMPARISON:  None. FINDINGS: Alignment: No subluxation. Skull base and vertebrae: No acute fracture. No primary bone lesion or focal pathologic process. Soft tissues and spinal canal: No prevertebral fluid or swelling. No visible canal hematoma. 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 Advanced degenerative changes at C5-6 and C6-7. Moderate to advanced bilateral diffuse degenerative facet disease, left greater than right. Upper chest: No acute findings Other: None IMPRESSION: Advanced degenerative changes.  No acute bony abnormality. Electronically Signed   By: Rolm Baptise M.D.   On: 04/03/2021 19:34   MR ANGIO HEAD WO CONTRAST  Result Date: 04/04/2021  CLINICAL DATA:  Dizziness EXAM: MRI HEAD WITHOUT CONTRAST MRA HEAD WITHOUT CONTRAST TECHNIQUE: Multiplanar, multi-echo pulse sequences of the brain and surrounding structures were acquired without intravenous contrast. Angiographic images of the Circle of Willis were acquired using MRA technique without intravenous contrast. COMPARISON:  No pertinent prior exam. FINDINGS: MRI HEAD FINDINGS Brain: No acute infarct, mass effect or extra-axial collection. No acute or chronic hemorrhage. Hyperintense T2-weighted signal is  widespread throughout the white matter. Diffuse, severe atrophy. The midline structures are normal. Vascular: Major flow voids are preserved. Skull and upper cervical spine: Normal calvarium and skull base. Visualized upper cervical spine and soft tissues are normal. Sinuses/Orbits:No paranasal sinus fluid levels or advanced mucosal thickening. No mastoid or middle ear effusion. Normal orbits. MRA HEAD FINDINGS POSTERIOR CIRCULATION: --Vertebral arteries: Normal --Inferior cerebellar arteries: Normal. --Basilar artery: Normal. --Superior cerebellar arteries: Normal. --Posterior cerebral arteries: Normal. ANTERIOR CIRCULATION: --Intracranial internal carotid arteries: Normal. --Anterior cerebral arteries (ACA): Normal. --Middle cerebral arteries (MCA): Normal. ANATOMIC VARIANTS: None IMPRESSION: 1. No acute intracranial abnormality. 2. 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 3. Normal intracranial MRA. Electronically Signed   By: Ulyses Jarred M.D.   On: 04/04/2021 01:37   MR BRAIN WO CONTRAST  Result Date: 04/04/2021 CLINICAL DATA:  Dizziness EXAM: MRI HEAD WITHOUT CONTRAST MRA HEAD WITHOUT CONTRAST TECHNIQUE: Multiplanar, multi-echo pulse sequences of the brain and surrounding structures were acquired without intravenous contrast. Angiographic images of the Circle of Willis were acquired using MRA technique without intravenous contrast. COMPARISON:  No pertinent prior exam. FINDINGS: MRI HEAD FINDINGS Brain: No acute infarct, mass effect or extra-axial collection. No acute or chronic hemorrhage. Hyperintense T2-weighted signal is widespread throughout the white matter. Diffuse, severe atrophy. The midline structures are normal. Vascular: Major flow voids are preserved. Skull and upper cervical spine: Normal calvarium and skull base. Visualized upper cervical spine and soft tissues are normal. Sinuses/Orbits:No paranasal sinus fluid levels or advanced mucosal thickening. No mastoid or middle ear  effusion. Normal orbits. MRA HEAD FINDINGS POSTERIOR CIRCULATION: --Vertebral arteries: Normal --Inferior cerebellar arteries: Normal. --Basilar artery: Normal. --Superior cerebellar arteries: Normal. --Posterior cerebral arteries: Normal. ANTERIOR CIRCULATION: --Intracranial internal carotid arteries: Normal. --Anterior cerebral arteries (ACA): Normal. --Middle cerebral arteries (MCA): Normal. ANATOMIC VARIANTS: None IMPRESSION: 1. No acute intracranial abnormality. 2. 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 3. Normal intracranial MRA. Electronically Signed   By: Ulyses Jarred M.D.   On: 04/04/2021 01:37   DG Shoulder Left  Result Date: 04/05/2021 CLINICAL DATA:  Left shoulder pain after fall EXAM: LEFT SHOULDER - 2+ VIEW COMPARISON:  None. FINDINGS: There is no evidence of fracture or dislocation. 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 Mild-to-moderate AC joint arthropathy. Soft tissues are unremarkable. IMPRESSION: No acute fracture or dislocation. 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 Electronically Signed   By: Davina Poke D.O.   On: 04/05/2021 13:57     Subjective: He is doing well, denies dizziness currently. His legs give out and he falls.   Discharge Exam: Vitals:   04/07/21 0819 04/07/21 0938  BP: (!) 175/96 (!) 164/86  Pulse: (!) 56 (!) 56  Resp: 20 20  Temp: 97.6 F (36.4 C) 97.7 F (36.5 C)  SpO2: 98% 100%     General: Pt is alert, awake, not in acute distress Cardiovascular: RRR, S1/S2 +, no rubs, no gallops Respiratory: CTA bilaterally, no wheezing, no rhonchi Abdominal: Soft, NT, ND, bowel sounds + Extremities: no edema, no cyanosis    The  results of significant diagnostics from this hospitalization (including imaging, microbiology, ancillary and laboratory) are listed below for reference.     Microbiology: Recent Results (from the past 240 hour(s))  Resp Panel by RT-PCR (Flu A&B, Covid) Nasopharyngeal  Swab     Status: None   Collection Time: 04/04/21  6:03 AM   Specimen: Nasopharyngeal Swab; Nasopharyngeal(NP) swabs in vial transport medium  Result Value Ref Range Status   SARS Coronavirus 2 by RT PCR NEGATIVE NEGATIVE Final    Comment: (NOTE) SARS-CoV-2 target nucleic acids are NOT DETECTED.  The SARS-CoV-2 RNA is generally detectable in upper respiratory specimens during the acute phase of infection. The lowest concentration of SARS-CoV-2 viral copies this assay can detect is 138 copies/mL. A negative result does not preclude SARS-Cov-2 infection and should not be used as the sole basis for treatment or other patient management decisions. A negative result may occur with  improper specimen collection/handling, submission of specimen other than nasopharyngeal swab, presence of viral mutation(s) within the areas targeted by this assay, and inadequate number of viral copies(<138 copies/mL). A negative result must be combined with clinical observations, patient history, and epidemiological information. The expected result is Negative.  Fact Sheet for Patients:  EntrepreneurPulse.com.au  Fact Sheet for Healthcare Providers:  IncredibleEmployment.be  This test is no t yet approved or cleared by the [place] FDA and  has been authorized for detection and/or diagnosis of SARS-CoV-2 by FDA under an Emergency Use Authorization (EUA). This EUA will remain  in effect (meaning this test can be used) for the duration of the COVID-19 declaration under Section 564(b)(1) of the Act, 21 U.S.C.section 360bbb-3(b)(1), unless the authorization is terminated  or revoked sooner.       Influenza A by PCR NEGATIVE NEGATIVE Final   Influenza B by PCR NEGATIVE NEGATIVE Final    Comment: (NOTE) The Xpert Xpress SARS-CoV-2/FLU/RSV plus assay is intended as an aid in the diagnosis of influenza from Nasopharyngeal swab specimens and should not be used as a sole  basis for treatment. Nasal washings and aspirates are unacceptable for Xpert Xpress SARS-CoV-2/FLU/RSV testing.  Fact Sheet for Patients: EntrepreneurPulse.com.au  Fact Sheet for Healthcare Providers: IncredibleEmployment.be  This test is not yet approved or cleared by the [place] FDA and has been authorized for detection and/or diagnosis of SARS-CoV-2 by FDA under an Emergency Use Authorization (EUA). This EUA will remain in effect (meaning this test can be used) for the duration of the COVID-19 declaration under Section 564(b)(1) of the Act, 21 U.S.C. section 360bbb-3(b)(1), unless the authorization is terminated or revoked.  Performed at [facility], [address] [place], [place] [address]      Labs: BNP (last 3 results) No results for input(s): BNP in the last 8760 hours. Basic Metabolic Panel: Recent Labs  Lab 04/03/21 1905 04/04/21 1126 04/05/21 0722 04/06/21 0616 04/07/21 0620  NA 135 139 137 139 141  K 3.6 3.5 4.1 3.5 3.5  CL 102 105 102 104 106  CO2 25 29 30 27 29   GLUCOSE 130* 103* 105* 103* 100*  BUN 17 18 22 15 11   CREATININE 0.85 0.89 1.07 0.72 0.70  CALCIUM 9.0 8.3* 8.4* 8.7* 8.6*  MG 1.7 1.8 2.0 1.9 1.9  PHOS  --  2.5 3.4 3.1 3.0   Liver Function Tests: Recent Labs  Lab 04/03/21 1905  AST 18  ALT 10  ALKPHOS 52  BILITOT 0.9  PROT 6.6  ALBUMIN 3.3*   No results for input(s): LIPASE,  AMYLASE in the last 168 hours. No results for input(s): AMMONIA in the last 168 hours. CBC: Recent Labs  Lab 04/03/21 1905 04/04/21 1126 04/05/21 0722 04/06/21 0616 04/07/21 0620  WBC 11.6* 9.4 7.2 8.3 7.9  NEUTROABS 9.6*  --   --   --   --   HGB 15.0 14.1 14.1 14.4 14.4  HCT 45.4 43.2 43.6 43.5 42.9  MCV 93.8 96.2 95.2 93.3 94.3  PLT 221 185 198 208 234   Cardiac Enzymes: No results for input(s): CKTOTAL, CKMB, CKMBINDEX, TROPONINI in the last 168 hours. BNP: Invalid input(s): POCBNP CBG: No  results for input(s): GLUCAP in the last 168 hours. D-Dimer No results for input(s): DDIMER in the last 72 hours. Hgb A1c No results for input(s): HGBA1C in the last 72 hours. Lipid Profile No results for input(s): CHOL, HDL, LDLCALC, TRIG, CHOLHDL, LDLDIRECT in the last 72 hours. Thyroid function studies No results for input(s): TSH, T4TOTAL, T3FREE, THYROIDAB in the last 72 hours.  Invalid input(s): FREET3 Anemia work up Recent Labs    04/04/21 1126  VITAMINB12 190  FOLATE 13.5  TIBC 251  IRON 26*   Urinalysis    Component Value Date/Time   COLORURINE YELLOW (A) 04/04/2021 1510   APPEARANCEUR CLEAR (A) 04/04/2021 1510   APPEARANCEUR Clear 02/12/2014 1357   LABSPEC 1.014 04/04/2021 1510   LABSPEC 1.018 02/12/2014 1357   PHURINE 5.0 04/04/2021 1510   GLUCOSEU NEGATIVE 04/04/2021 1510   GLUCOSEU Negative 02/12/2014 1357   HGBUR NEGATIVE 04/04/2021 1510   BILIRUBINUR NEGATIVE 04/04/2021 1510   BILIRUBINUR Negative 02/12/2014 1357   KETONESUR NEGATIVE 04/04/2021 1510   PROTEINUR 100 (A) 04/04/2021 1510   NITRITE NEGATIVE 04/04/2021 1510   LEUKOCYTESUR NEGATIVE 04/04/2021 1510   LEUKOCYTESUR Negative 02/12/2014 1357   Sepsis Labs Invalid input(s): PROCALCITONIN,  WBC,  LACTICIDVEN Microbiology Recent Results (from the past 240 hour(s))  Resp Panel by RT-PCR (Flu A&B, Covid) Nasopharyngeal Swab     Status: None   Collection Time: 04/04/21  6:03 AM   Specimen: Nasopharyngeal Swab; Nasopharyngeal(NP) swabs in vial transport medium  Result Value Ref Range Status   SARS Coronavirus 2 by RT PCR NEGATIVE NEGATIVE Final    Comment: (NOTE) SARS-CoV-2 target nucleic acids are NOT DETECTED.  The SARS-CoV-2 RNA is generally detectable in upper respiratory specimens during the acute phase of infection. The lowest concentration of SARS-CoV-2 viral copies this assay can detect is 138 copies/mL. A negative result does not preclude SARS-Cov-2 infection and should not be used as the  sole basis for treatment or other patient management decisions. A negative result may occur with  improper specimen collection/handling, submission of specimen other than nasopharyngeal swab, presence of viral mutation(s) within the areas targeted by this assay, and inadequate number of viral copies(<138 copies/mL). A negative result must be combined with clinical observations, patient history, and epidemiological information. The expected result is Negative.  Fact Sheet for Patients:  EntrepreneurPulse.com.au  Fact Sheet for Healthcare Providers:  IncredibleEmployment.be  This test is no t yet approved or cleared by the [place] FDA and  has been authorized for detection and/or diagnosis of SARS-CoV-2 by FDA under an Emergency Use Authorization (EUA). This EUA will remain  in effect (meaning this test can be used) for the duration of the COVID-19 declaration under Section 564(b)(1) of the Act, 21 U.S.C.section 360bbb-3(b)(1), unless the authorization is terminated  or revoked sooner.       Influenza A by PCR NEGATIVE NEGATIVE Final   Influenza  B by PCR NEGATIVE NEGATIVE Final    Comment: (NOTE) The Xpert Xpress SARS-CoV-2/FLU/RSV plus assay is intended as an aid in the diagnosis of influenza from Nasopharyngeal swab specimens and should not be used as a sole basis for treatment. Nasal washings and aspirates are unacceptable for Xpert Xpress SARS-CoV-2/FLU/RSV testing.  Fact Sheet for Patients: EntrepreneurPulse.com.au  Fact Sheet for Healthcare Providers: IncredibleEmployment.be  This test is not yet approved or cleared by the [place] FDA and has been authorized for detection and/or diagnosis of SARS-CoV-2 by FDA under an Emergency Use Authorization (EUA). This EUA will remain in effect (meaning this test can be used) for the duration of the COVID-19 declaration under Section 564(b)(1) of the  Act, 21 U.S.C. section 360bbb-3(b)(1), unless the authorization is terminated or revoked.  Performed at [facility], [address], [place], [place] [address]      Time coordinating discharge: 40 minutes  SIGNED:   Elmarie Shiley, MD  [facility]

## 2021-04-07 NOTE — ED Notes (Signed)
Ericka RN aware of assigned bed 

## 2021-04-07 NOTE — TOC Progression Note (Signed)
Transition of Care [facility]) - Progression Note    Patient Details  Name: Steven Welch MRN: [id number] Date of Birth: 1940-10-05  [facility] CM/SW Contact  Shelbie Hutching, RN Phone Number: 04/07/2021, 10:32 AM  Clinical Narrative:    [facility] patient [facility], insurance Josem Kaufmann has been Garment/textile technologist # [id number].  Patient approved from 1/10-1/12.  [facility] needs COVID test from today.  COVID ordered asked bedside RN to collect. Report handed off to 2A Clinical SW.    Expected Discharge Plan: [place] Barriers to Discharge: No Barriers Identified  Expected Discharge Plan and Services Expected Discharge Plan: [place]   Discharge Planning Services: CM Consult Post Acute Care Choice: [place] Living arrangements for the past 2 months: Single Family Home Expected Discharge Date: 04/07/21               DME Arranged: N/A DME Agency: NA       HH Arranged: NA HH Agency: NA         Social Determinants of Health (SDOH) Interventions    Readmission Risk Interventions No flowsheet data found.

## 2021-04-07 NOTE — TOC Transition Note (Signed)
Transition of Care [facility]) - CM/SW Discharge Note   Patient Details  Name: Steven Welch MRN: [id number] Date of Birth: 05-08-1940  [facility]) CM/SW Contact:  Alberteen Sam, LCSW Phone Number: 04/07/2021, 10:16 AM   Clinical Narrative:     Patient will DC to: [facility] Anticipated DC date: 04/07/21 Family notified: spouse Transport by: Johnanna Schneiders  Per MD patient ready for DC to [facility] . RN, patient, patient's family, and facility notified of DC. Discharge Summary sent to facility. RN given number for report [phone] Room 502. DC packet on chart. Ambulance transport requested for patient.  CSW signing off.  Pricilla Riffle, LCSW    Final next level of care: Skilled Nursing Facility Barriers to Discharge: No Barriers Identified   Patient Goals and CMS Choice Patient states their goals for this hospitalization and ongoing recovery are:: to go home CMS Medicare.gov Compare Post Acute Care list provided to:: Patient Represenative (must comment) (spouse) Choice offered to / list presented to : Spouse  Discharge Placement              Patient [facility] Patient to be transferred to facility by: [facility] Name of family member notified: spouse Patient and family notified of of transfer: 04/07/21  Discharge Plan and Services   Discharge Planning Services: CM Consult Post Acute Care Choice: [place]          DME Arranged: N/A DME Agency: NA       HH Arranged: NA HH Agency: NA        Social Determinants of Health (SDOH) Interventions     Readmission Risk Interventions No flowsheet data found.

## 2021-04-07 NOTE — Progress Notes (Addendum)
Pt to be admitted to 257, report given to Katherine Roan, RN, wife and daughter at bedside, pt transferred via stretcher, belongings sent with pt. Pt stable at this time.

## 2021-04-09 ENCOUNTER — Ambulatory Visit
Admission: RE | Admit: 2021-04-09 | Discharge: 2021-04-09 | Disposition: A | Discharge status: Home / Self Care | Payer: Medicare Other | Source: Ambulatory Visit | Attending: Internal Medicine | Admitting: Internal Medicine

## 2021-04-09 ENCOUNTER — Other Ambulatory Visit: Payer: Self-pay

## 2021-04-09 ENCOUNTER — Other Ambulatory Visit: Payer: Self-pay | Admitting: Internal Medicine

## 2021-04-09 DIAGNOSIS — R0902 Hypoxemia: Secondary | ICD-10-CM

## 2021-04-09 IMAGING — CT CT ANGIO CHEST
2 of 7 series · 15 of 36 positions shown · IV contrast (agent unspecified)
Comparison: Chest x-ray [DATE]

CLINICAL DATA: Hypoxemia [0M] ([0M]-CM)

EXAM:
CT ANGIOGRAPHY CHEST WITH CONTRAST
TECHNIQUE: Multidetector CT imaging of the chest was performed using the
standard protocol during bolus administration of intravenous
contrast. Multiplanar CT image reconstructions and MIPs were
obtained to evaluate the vascular anatomy.

[Series 6: thins cta pulmonary 1.00 · axial · 0.61mm/px · z∈[-1309,-1033]mm · 14 of 399 slices shown]
[im 27/399  lung]
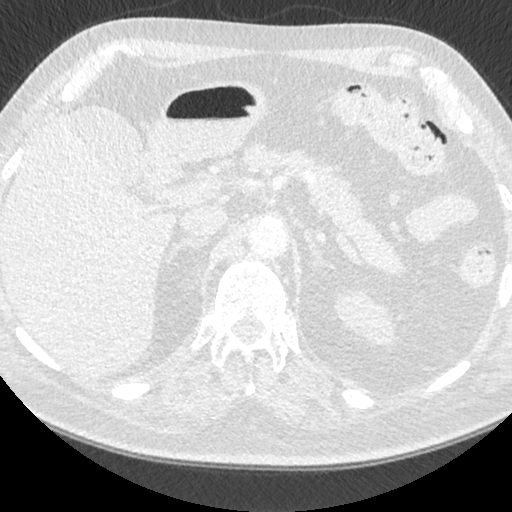
[im 54/399  mediastinal]
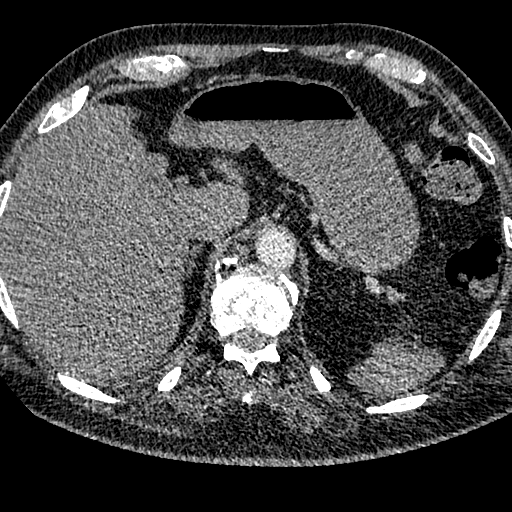
[im 80/399  lung]
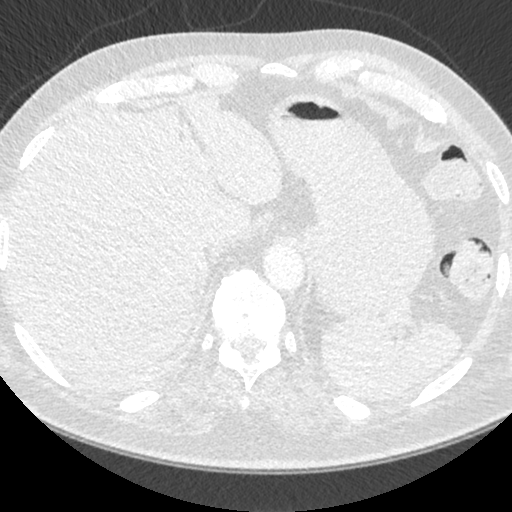
[im 107/399  mediastinal]
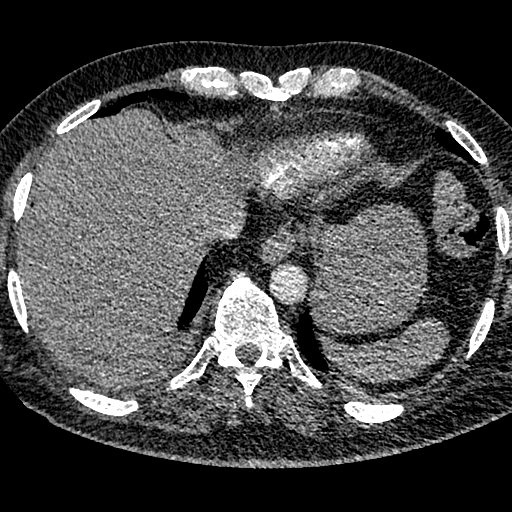
[im 133/399  lung]
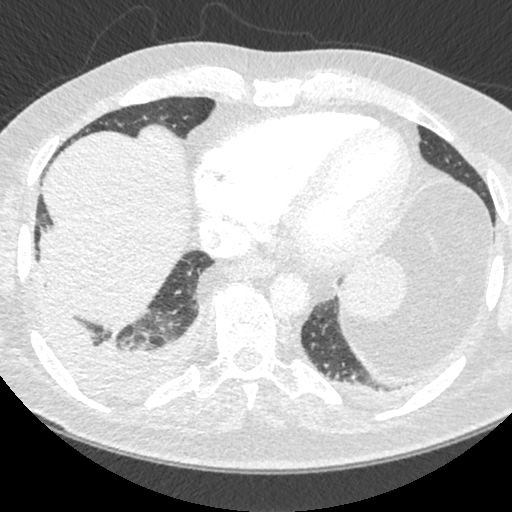
[im 160/399  mediastinal]
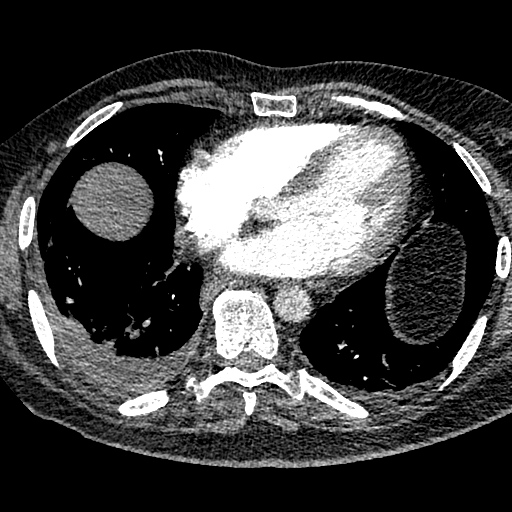
[im 186/399  lung]
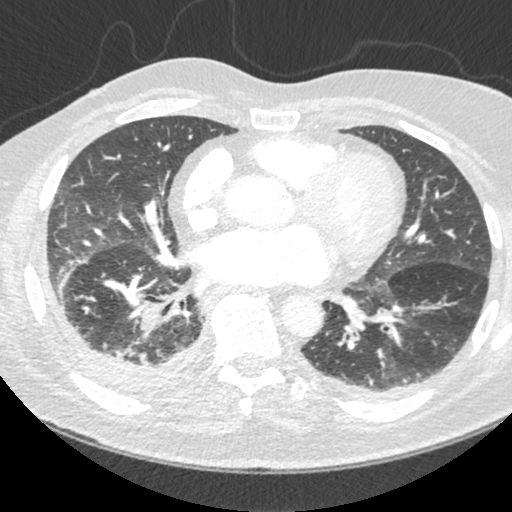
[im 213/399  mediastinal]
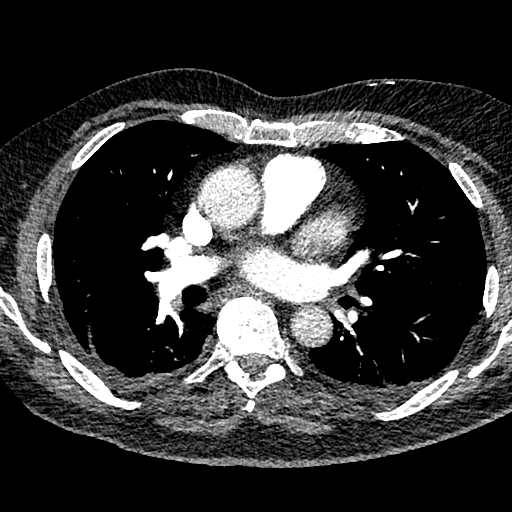
[im 239/399  lung]
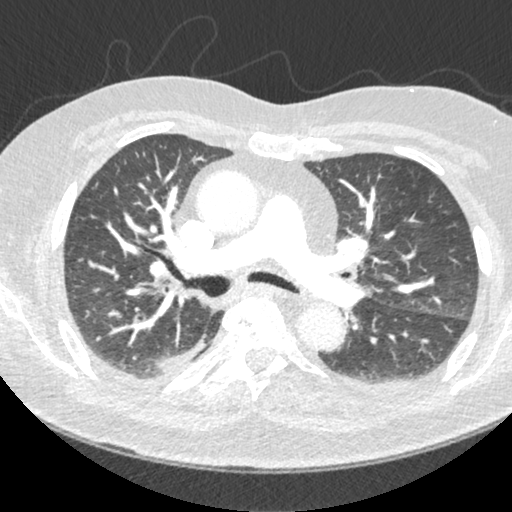
[im 266/399  mediastinal]
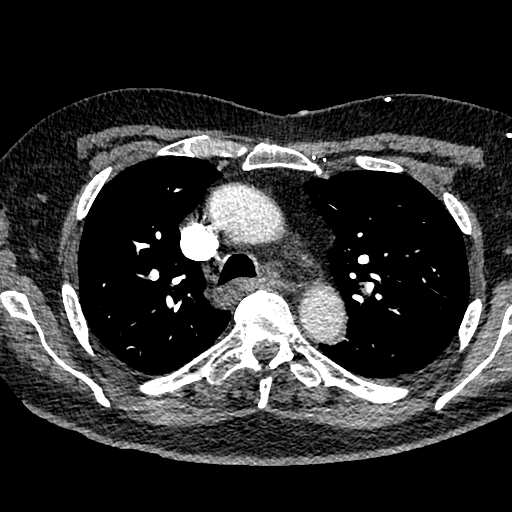
[im 292/399  lung]
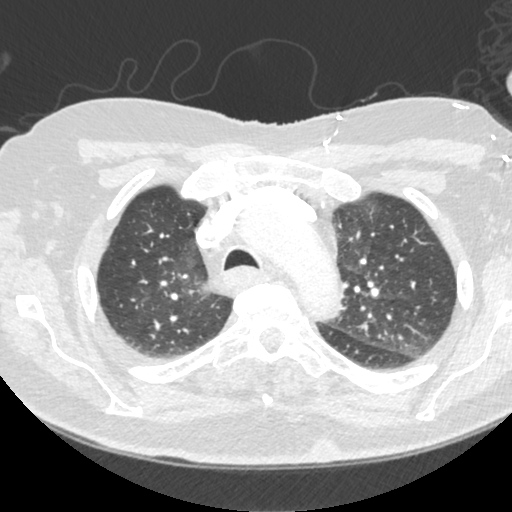
[im 319/399  mediastinal]
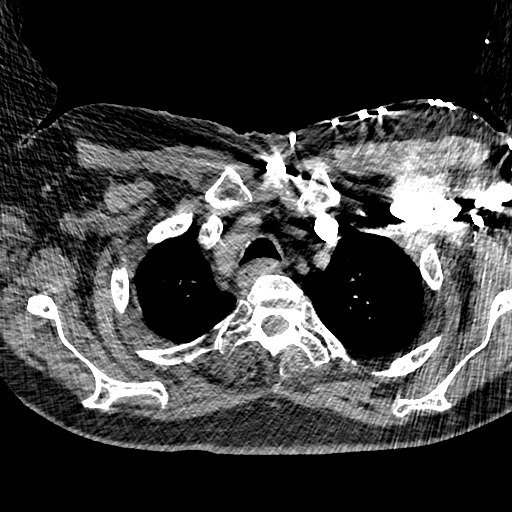
[im 345/399  lung]
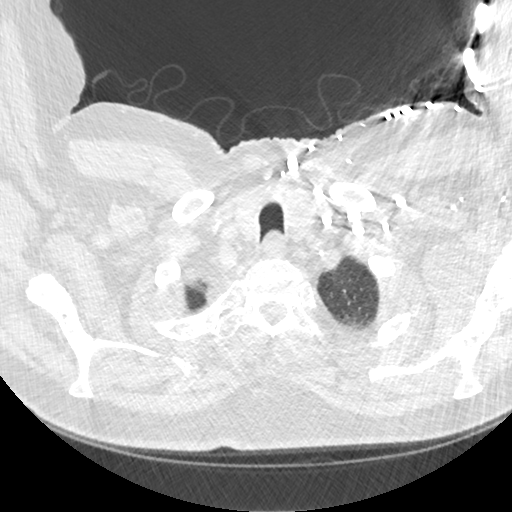
[im 372/399  mediastinal]
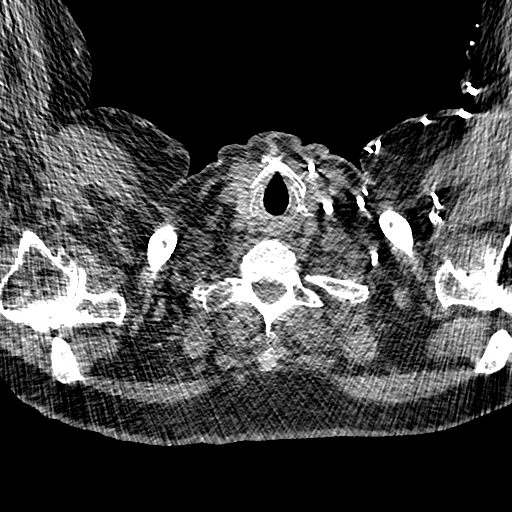

[Series 8: cta pulmonary 2.00 cor · coronal · 0.61mm/px · 1 of 156 slices shown]
[im 78/156  mediastinal]
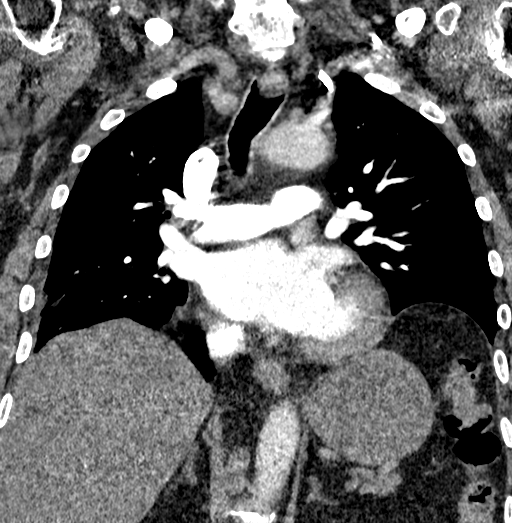

[15 of 36 positions shown; findings below may reference images not displayed]

RADIATION DOSE REDUCTION: This exam was performed according to the
departmental dose-optimization program which includes automated
exposure control, adjustment of the mA and/or kV according to
patient size and/or use of iterative reconstruction technique.

CONTRAST:  80mL OMNIPAQUE IOHEXOL 350 MG/ML SOLN
FINDINGS: Cardiovascular: Satisfactory opacification of the pulmonary arteries
to the segmental level. No evidence of pulmonary embolism. Thoracic
aorta is nonaneurysmal. Scattered atherosclerotic calcification of
the aorta and coronary arteries. Heart size is upper limits of
normal. No pericardial effusion.

Mediastinum/Nodes: No enlarged mediastinal, hilar, or axillary lymph
nodes. Thyroid gland, trachea, and esophagus demonstrate no
significant findings.

Lungs/Pleura: Small right and trace left pleural effusions with mild
compressive atelectasis. No pneumothorax.

Upper Abdomen: No acute abnormality.

Musculoskeletal: Severe osteoarthritis bilateral glenohumeral
joints. Mild thoracic dextrocurvature. Multilevel bridging
osteophytes throughout the cervical spine suggesting DISH. No acute
osseous abnormality. Left greater than right gynecomastia.

Review of the MIP images confirms the above findings.
IMPRESSION: 1. No evidence of acute pulmonary embolism.
2. Small right and trace left pleural effusions with mild
compressive atelectasis.

Aortic Atherosclerosis ([0M]-[0M]).

## 2021-04-09 MED ORDER — IOHEXOL 350 MG/ML SOLN
80.0000 mL | Freq: Once | INTRAVENOUS | Status: AC | PRN
  Administered 2021-04-09: 80 mL via INTRAVENOUS

## 2021-04-28 ENCOUNTER — Other Ambulatory Visit: Payer: Self-pay

## 2021-04-28 ENCOUNTER — Emergency Department
Admission: EM | Admit: 2021-04-28 | Discharge: 2021-04-28 | Disposition: A | Discharge status: Home / Self Care | Payer: Medicare Other | Attending: Emergency Medicine | Admitting: Emergency Medicine

## 2021-04-28 DIAGNOSIS — Z20822 Contact with and (suspected) exposure to covid-19: Secondary | ICD-10-CM | POA: Diagnosis not present

## 2021-04-28 DIAGNOSIS — R110 Nausea: Secondary | ICD-10-CM | POA: Insufficient documentation

## 2021-04-28 DIAGNOSIS — I10 Essential (primary) hypertension: Secondary | ICD-10-CM | POA: Insufficient documentation

## 2021-04-28 LAB — BASIC METABOLIC PANEL
Anion gap: 8 (ref 5–15)
BUN: 21 mg/dL (ref 8–23)
CO2: 25 mmol/L (ref 22–32)
Calcium: 9.5 mg/dL (ref 8.9–10.3)
Chloride: 98 mmol/L (ref 98–111)
Creatinine, Ser: 0.75 mg/dL (ref 0.61–1.24)
GFR, Estimated: >60 mL/min (ref >60)
Glucose, Bld: 127 mg/dL — ABNORMAL HIGH (ref 70–99)
Potassium: 3.9 mmol/L (ref 3.5–5.1)
Sodium: 131 mmol/L — ABNORMAL LOW (ref 135–145)

## 2021-04-28 LAB — CBC
HCT: 49.4 % (ref 39.0–52.0)
Hemoglobin: 16.5 g/dL (ref 13.0–17.0)
MCH: 30.6 pg (ref 26.0–34.0)
MCHC: 33.4 g/dL (ref 30.0–36.0)
MCV: 91.5 fL (ref 80.0–100.0)
Platelets: 254 10*3/uL (ref 150–400)
RBC: 5.4 MIL/uL (ref 4.22–5.81)
RDW: 12.8 % (ref 11.5–15.5)
WBC: 10.7 10*3/uL — ABNORMAL HIGH (ref 4.0–10.5)
nRBC: 0 % (ref 0.0–0.2)

## 2021-04-28 LAB — RESP PANEL BY RT-PCR (FLU A&B, COVID) ARPGX2
Influenza A by PCR: NEGATIVE
Influenza B by PCR: NEGATIVE
SARS Coronavirus 2 by RT PCR: NEGATIVE

## 2021-04-28 LAB — TROPONIN I (HIGH SENSITIVITY): Troponin I (High Sensitivity): 13 ng/L (ref <18)

## 2021-04-28 MED ORDER — LOSARTAN POTASSIUM 50 MG PO TABS: 50.0000 mg | ORAL_TABLET | Freq: Every day | ORAL | 0 refills | Status: DC

## 2021-04-28 MED ORDER — SPIRONOLACTONE 25 MG PO TABS: 25.0000 mg | ORAL_TABLET | Freq: Every day | ORAL | 0 refills | Status: DC

## 2021-04-28 NOTE — ED Triage Notes (Signed)
Pt comes with c/o HTN for last few days. Pt states he takes BP meds. Pt also states some vomiting.

## 2021-04-28 NOTE — ED Provider Notes (Signed)
[facility] Provider Note    Event Date/Time   First MD Initiated Contact with Patient 04/28/21 1302     (approximate)  History   Chief Complaint: Hypertension  HPI  Steven Welch is a 81 y.o. male with a past medical history of gastric reflux, hypertension, hyperlipidemia, presents to the emergency department for high blood pressure and nausea.  According to the patient and wife for the past 2 or 3 days the patient's blood pressures been running elevated and he has been experiencing mild nausea and yesterday had a headache.  Patient takes hydralazine as well as spironolactone and hand for blood pressure.  Patient ran out of spironolactone and losartan but is still taking his hydralazine.  Wife states at home the blood pressure was as high as 200 which scared him so they came to the emergency department for evaluation.  Currently patient's blood pressure in the emergency department 160/98.  Patient denies any chest pain at any point.  Denies any shortness of breath or cough.    Physical Exam   Triage Vital Signs: ED Triage Vitals  Enc Vitals Group     BP 04/28/21 1019 (!) 160/98     Pulse Rate 04/28/21 1019 (!) 56     Resp 04/28/21 1019 18     Temp 04/28/21 1019 98.1 F (36.7 C)     Temp Source 04/28/21 1019 Oral     SpO2 04/28/21 1019 97 %     Weight --      Height --      Head Circumference --      Peak Flow --      Pain Score 04/28/21 1019 0     Pain Loc --      Pain Edu? --      Excl. in [place]? --     Most recent vital signs: Vitals:   04/28/21 1019  BP: (!) 160/98  Pulse: (!) 56  Resp: 18  Temp: 98.1 F (36.7 C)  SpO2: 97%    General: Awake, no distress.  CV:  Good peripheral perfusion.  Regular rate and rhythm  Resp:  Normal effort.  Equal breath sounds bilaterally.  Abd:  No distention.  Soft, nontender.  No rebound or guarding.   ED Results / Procedures / Treatments   EKG  EKG viewed and interpreted by myself shows sinus  bradycardia 53 bpm with a widened QRS, normal axis, largely normal intervals with nonspecific ST changes.   MEDICATIONS ORDERED IN ED: Medications - No data to display   IMPRESSION / MDM / [place] / ED COURSE  I reviewed the triage vital signs and the nursing notes.  Patient presents to the emergency department for hypertension as high as 295 systolic at home as well as nausea at home.  Denies any chest pain.  Patient's basic labs are largely within normal limits, reassuring CBC with a normal hemoglobin, reassuring chemistry with normal renal function.  I have added on a troponin as a precaution and given the headache and nausea have added on a COVID swab.  Patient is out of 2 of his blood pressure medications which very likely is the cause of his elevated blood pressures recently.  Denies any headache currently.  Denies any weakness or numbness at any point.  If the patient's troponin and COVID swab are normal anticipate likely discharge home with PCP follow-up.  Patient agreeable to plan of care.  Patient's COVID/flu test is negative.  Troponin is negative.  Lab work is reassuring including a normal chemistry, normal CBC.  Reassuring EKG.  Given the patient's reassuring work-up we will discharge home and refill the patient's blood pressure medications.  Patient agreeable to plan of care.  FINAL CLINICAL IMPRESSION(S) / ED DIAGNOSES   Hypertension  Rx / DC Orders   Refills of spironolactone and losartan  Note:  This document was prepared using Dragon voice recognition software and may include unintentional dictation errors.   Harvest Dark, MD 04/28/21 1359

## 2021-04-28 NOTE — ED Triage Notes (Signed)
First Nurse Note:  ARrives from [facility] for ED evaluation of 3 days of elevated BP and vomiting.  Patient is AAOx3.  Skin warm and dry. NAD

## 2021-07-15 ENCOUNTER — Other Ambulatory Visit: Payer: Self-pay | Admitting: Sports Medicine

## 2021-07-15 DIAGNOSIS — M50322 Other cervical disc degeneration at C5-C6 level: Secondary | ICD-10-CM

## 2021-07-15 DIAGNOSIS — R269 Unspecified abnormalities of gait and mobility: Secondary | ICD-10-CM

## 2021-07-15 DIAGNOSIS — G8929 Other chronic pain: Secondary | ICD-10-CM

## 2021-07-15 DIAGNOSIS — M47812 Spondylosis without myelopathy or radiculopathy, cervical region: Secondary | ICD-10-CM

## 2021-07-29 ENCOUNTER — Ambulatory Visit: Payer: Medicare Other

## 2021-08-04 ENCOUNTER — Ambulatory Visit
Admission: RE | Admit: 2021-08-04 | Discharge: 2021-08-04 | Disposition: A | Discharge status: Home / Self Care | Payer: Medicare Other | Source: Ambulatory Visit | Attending: Sports Medicine | Admitting: Sports Medicine

## 2021-08-04 DIAGNOSIS — M25511 Pain in right shoulder: Secondary | ICD-10-CM | POA: Diagnosis present

## 2021-08-04 DIAGNOSIS — R269 Unspecified abnormalities of gait and mobility: Secondary | ICD-10-CM | POA: Diagnosis present

## 2021-08-04 DIAGNOSIS — G8929 Other chronic pain: Secondary | ICD-10-CM | POA: Insufficient documentation

## 2021-08-04 DIAGNOSIS — M47812 Spondylosis without myelopathy or radiculopathy, cervical region: Secondary | ICD-10-CM | POA: Diagnosis present

## 2021-08-04 DIAGNOSIS — M25512 Pain in left shoulder: Secondary | ICD-10-CM | POA: Diagnosis present

## 2021-08-04 DIAGNOSIS — M50322 Other cervical disc degeneration at C5-C6 level: Secondary | ICD-10-CM | POA: Insufficient documentation

## 2021-08-04 IMAGING — MR MR CERVICAL SPINE W/O CM
5 series · 38 of 48 positions shown · non-contrast
Comparison: None Available.

CLINICAL DATA: Cervical spondylosis; chronic pain of both
shoulders; other cervical disc degeneration at C5-C6 level;
technologist note states neck pain with bilateral shoulder and pain
and numbness

EXAM:
MRI CERVICAL SPINE WITHOUT CONTRAST
TECHNIQUE: Multiplanar, multisequence MR imaging of the cervical spine was
performed. No intravenous contrast was administered.

[Series 5: T2 · sagittal · 3.0mm · 0.62mm/px · 7 of 15 slices shown (1 of 2)]
[im 1/15]
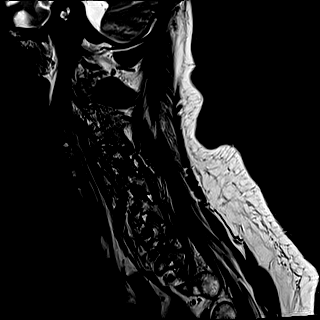
[im 3/15]
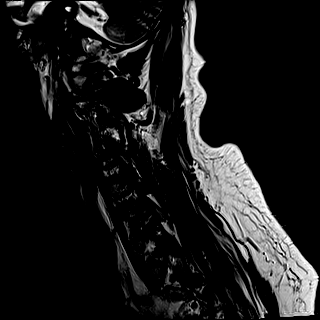
[im 5/15]
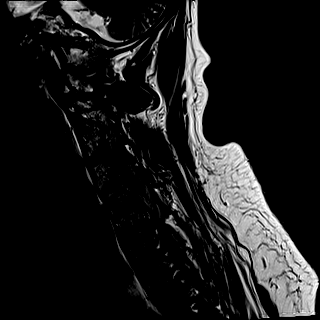
[im 8/15]
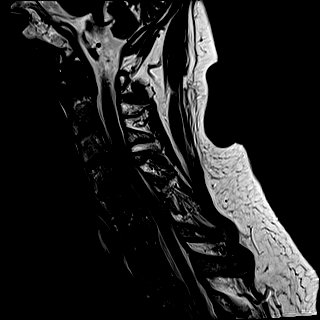
[im 10/15]
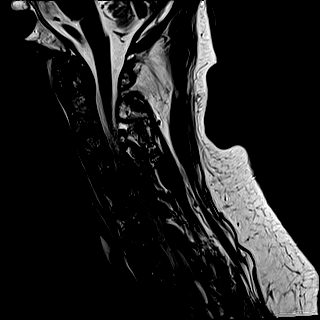
[im 12/15]
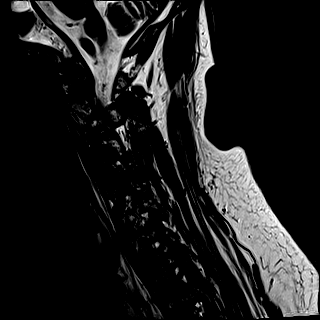
[im 15/15]
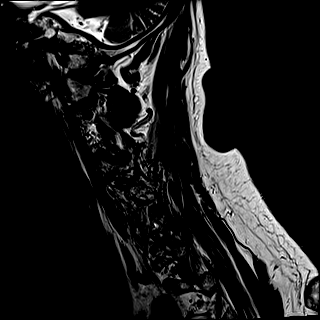

[Series 6: FLAIR · sagittal · 3.0mm · 0.78mm/px · 7 of 15 slices shown]
[im 1/15]
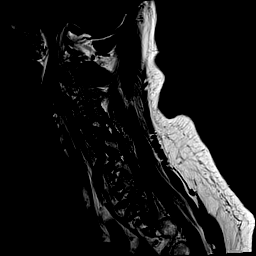
[im 3/15]
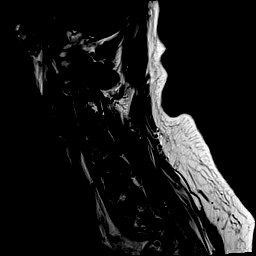
[im 5/15]
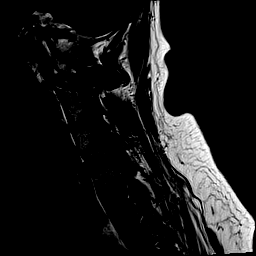
[im 8/15]
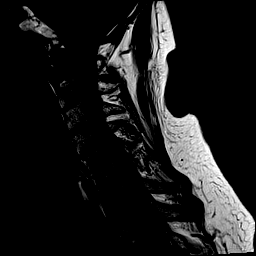
[im 10/15]
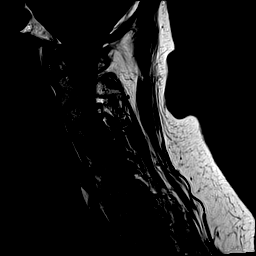
[im 12/15]
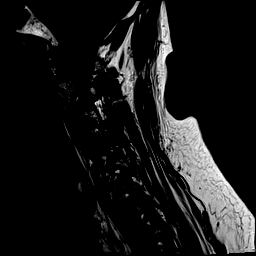
[im 15/15]
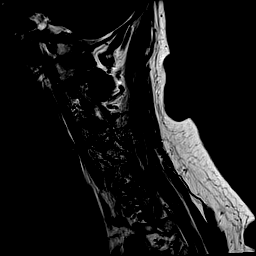

[Series 7: STIR · sagittal · 3.0mm · 0.62mm/px · 6 of 15 slices shown]
[im 1/15]
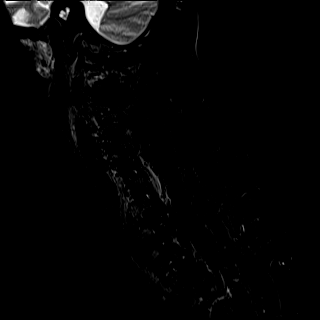
[im 3/15]
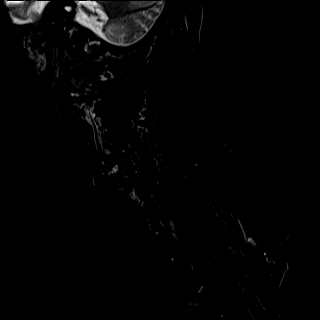
[im 6/15]
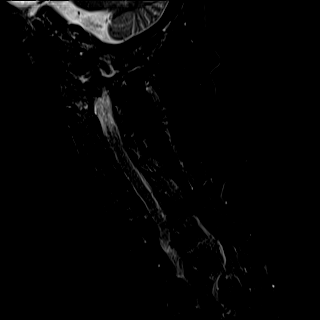
[im 9/15]
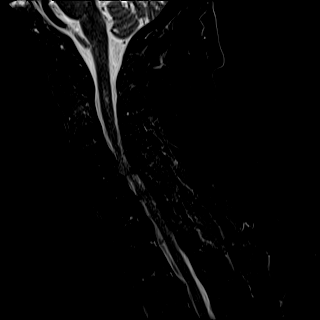
[im 12/15]
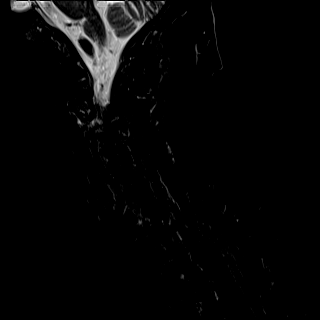
[im 15/15]
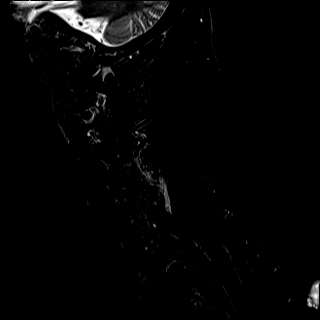

[Series 8: T2 · axial · 3.0mm · 0.70mm/px · z∈[-116,-9]mm · 10 of 33 slices shown (2 of 2)]
[im 1/33]
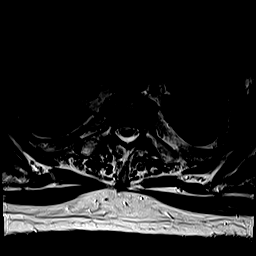
[im 3/33]
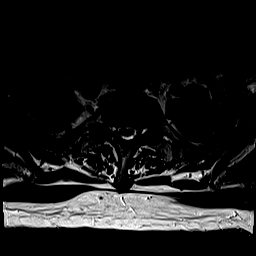
[im 5/33]
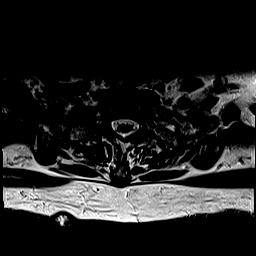
[im 8/33]
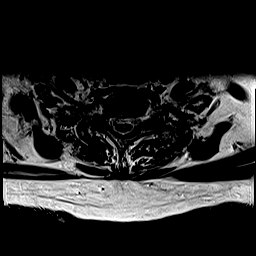
[im 10/33]
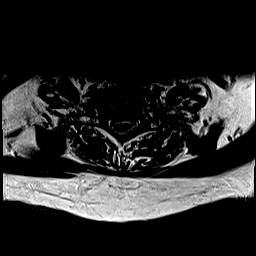
[im 15/33]
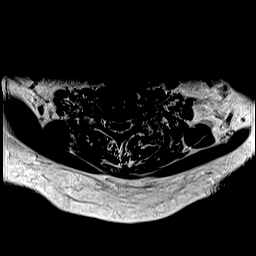
[im 18/33]
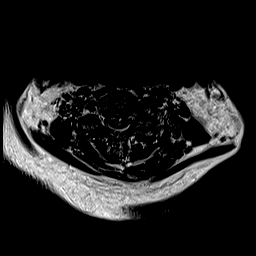
[im 23/33]
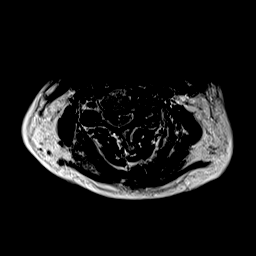
[im 28/33]
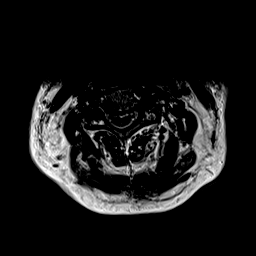
[im 33/33]
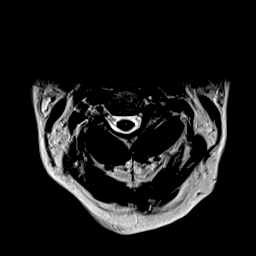

[Series 9: ax mpgr · axial · 3.0mm · 0.35mm/px · z∈[-116,-9]mm · 8 of 33 slices shown]
[im 1/33]
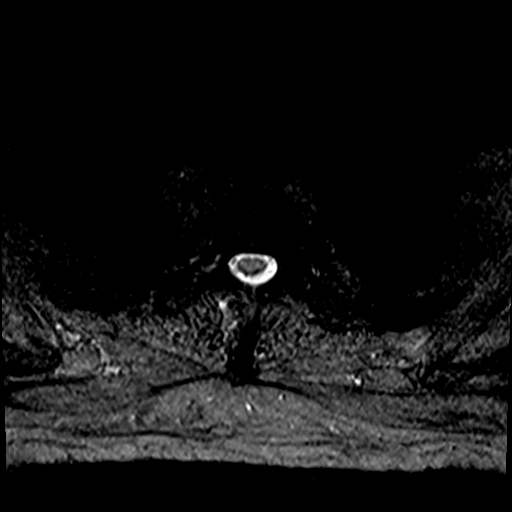
[im 5/33]
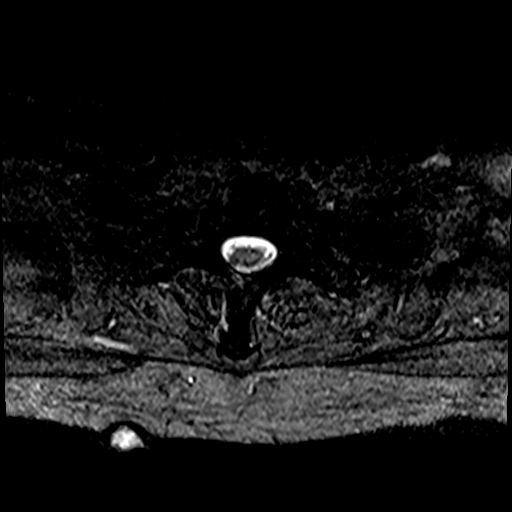
[im 10/33]
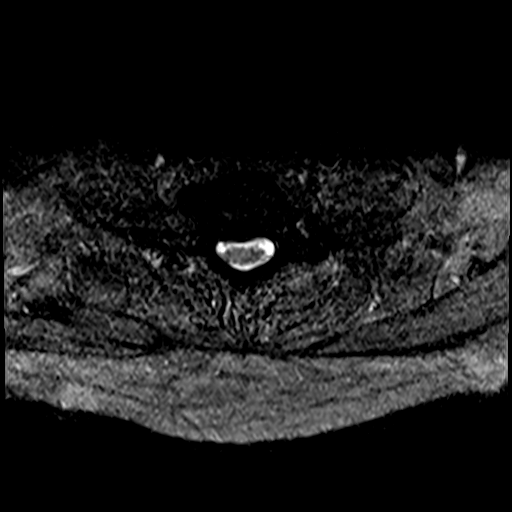
[im 15/33]
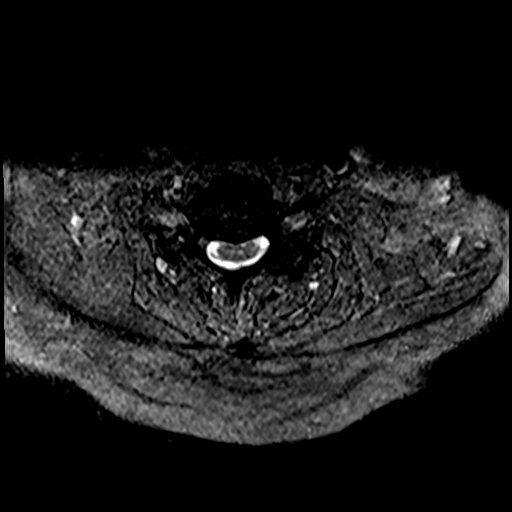
[im 18/33]
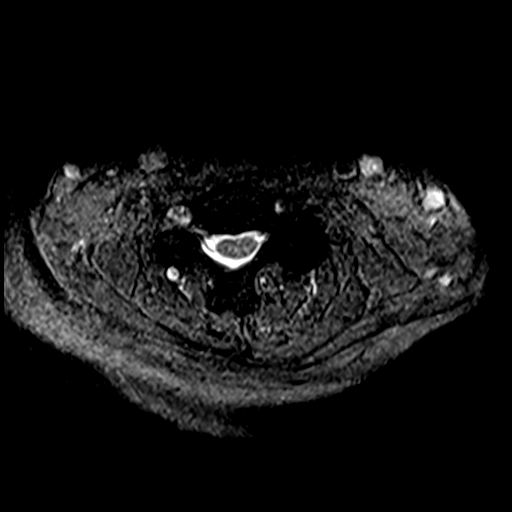
[im 23/33]
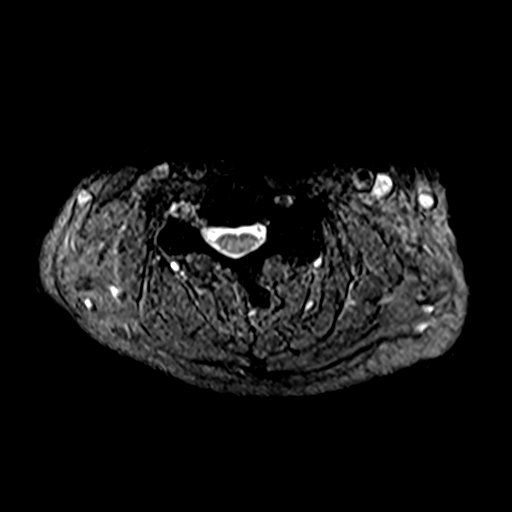
[im 28/33]
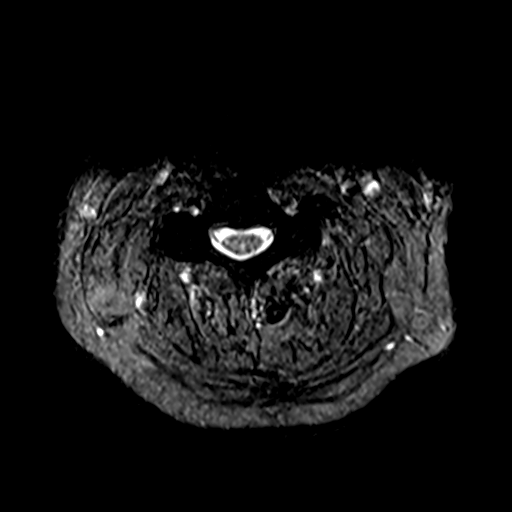
[im 33/33]
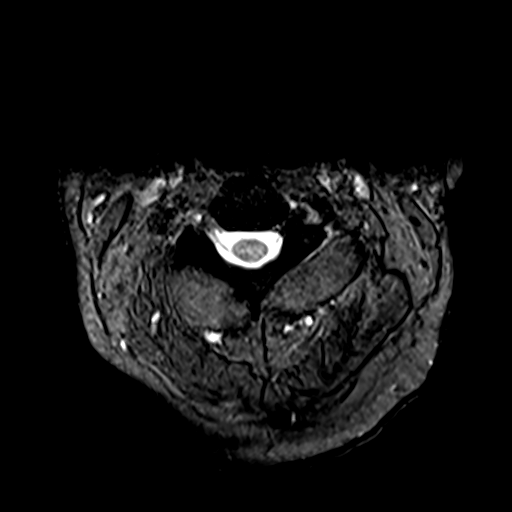

[38 of 48 positions shown; findings below may reference images not displayed]

FINDINGS: Alignment: Mild anterolisthesis at C4-C5.

Vertebrae: Degenerative endplate irregularity at C5-C6 and C6-C7.
Mild marrow edema at the right C1-C2 articulation imaged in the
sagittal plane only is probably on a degenerative basis.

Cord: No abnormal signal.

Posterior Fossa, vertebral arteries, paraspinal tissues:
Unremarkable.

Disc levels:

C2-C3: Facet hypertrophy with ankylosis on the right. No canal or
foraminal stenosis.

C3-C4: Central disc protrusion. Facet and uncovertebral hypertrophy.
Moderate canal stenosis. Marked foraminal stenosis.

C4-C5: Minimal disc bulge with endplate osteophytes. Left greater
than right facet and uncovertebral hypertrophy. Mild canal stenosis.
Mild right and moderate to marked left foraminal stenosis.

C5-C6: Marked disc space narrowing with partial bridging across the
disc space. Disc bulge with endplate osteophytes. Uncovertebral and
left greater than right facet hypertrophy. Mild canal stenosis. Mild
right and moderate to marked left foraminal stenosis.

C6-C7: Marked displacement with partial bridging across the disc
space. Disc bulge with endplate osteophytes. Uncovertebral
hypertrophy. Mild canal stenosis. Marked right foraminal stenosis.
No left foraminal stenosis.

C7-T1: Disc bulge with endplate osteophytes eccentric to the right.
Mild facet hypertrophy. No canal or left foraminal stenosis. Mild
right foraminal stenosis.
IMPRESSION: Multilevel degenerative changes as detailed above. Canal stenosis is
greatest at C3-C4. There is multilevel left greater than right
foraminal stenosis. Multilevel facet arthropathy.

## 2021-08-30 NOTE — Progress Notes (Deleted)
Cardiology Office Note  Date:  08/30/2021   ID:  Steven Welch, DOB Jan 12, 1941, MRN [id number]  PCP:  Juluis Pitch, MD   No chief complaint on file.   HPI:  Steven Welch is a 81 y.o. 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   low-grade fever and borderline BP.   noted to be in A. fib with RVR in the emergency department and admitted for sepsis.  treated with antibiotics.   started on Eliquis.  maintained on midodrine for lower BP.   Echo during admission showed EF 60 to 65%, NR WMA.  Office visit 01/18/21  EKG showed SB heart rate 43bpm.  Toprol was stopped.  started on lasix '20mg'$  daily and spironolactone '25mg'$  daily.    Last office visit February 04, 2021, maintaining normal sinus rhythm  In follow-up today, he presents with his wife He is very slow to move around, slow to get from chair to exam table Gait instability, walks with a cane Wife reports that he sits all day in a chair, no exercise, minimal movement, watches TV all day He has no hobbies, no desire to do anything  He is not on half of his medications, reports that the Eliquis ran out, Eliquis ran out, not on Protonix He continues on spironolactone, potassium  Blood pressure elevated today  EKG personally reviewed by myself on todays visit Normal sinus rhythm rate 67 bpm right bundle branch block  PMH:   has a past medical history of Cyst of breast (1981), GERD (gastroesophageal reflux disease), Hepatitis (1988), History of shingles, Hyperlipidemia, Hypertension, Osteoarthritis of right hip, and Skin cancer of face.  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     apixaban (ELIQUIS) 5 MG TABS tablet Take 1 tablet (5 mg total) by mouth 2 (two) times daily. 60 tablet 6   ascorbic acid (VITAMIN C) 500 MG tablet Take 1 tablet (500 mg total) by mouth daily. 30 tablet 0   benzonatate (TESSALON PERLES) 100 MG capsule Take 1 capsule (100 mg total) by mouth 3 (three) times daily as needed for cough. 30 capsule 0   diclofenac Sodium (VOLTAREN) 1 % GEL Apply 2 g topically 4 (four) times daily. 2 g 0   hydrALAZINE (APRESOLINE) 25 MG tablet Take 1 tablet (25 mg total) by mouth every 6 (six) hours as needed (BP > 160/100). 30 tablet 0   iron polysaccharides (NIFEREX) 150 MG capsule Take 1 capsule (150 mg total) by mouth daily. 30 capsule 0   losartan (COZAAR) 50 MG tablet Take 1 tablet (50 mg total) by mouth daily. 30 tablet 0   spironolactone (ALDACTONE) 25  MG tablet Take 1 tablet (25 mg total) by mouth daily. 30 tablet 0   vitamin B-12 1000 MCG tablet Take 1 tablet (1,000 mcg total) by mouth daily. 30 tablet 0   No current facility-administered medications for this visit.     Allergies:   Patient has no known allergies.   Social History:  The patient  reports that he has never smoked. He has never used smokeless tobacco. He reports that he does not currently use alcohol. He reports that he does not use drugs.   Family History:   family history includes CAD in his mother; Lung cancer in his father.    Review of Systems: Review of Systems  Constitutional: Negative.   HENT: Negative.    Respiratory: Negative.    Cardiovascular:  Negative.   Gastrointestinal: Negative.   Musculoskeletal: Negative.   Neurological: Negative.   Psychiatric/Behavioral: Negative.    All other systems reviewed and are negative.   PHYSICAL EXAM: VS:  There were no vitals taken for this visit. , BMI There is no height or weight on file to calculate BMI. GEN: Well nourished, well developed, in no acute distress HEENT: normal Neck: no JVD, carotid bruits, or masses Cardiac: RRR; no murmurs, rubs, or gallops,no edema  Respiratory:  clear to auscultation bilaterally, normal work of breathing GI: soft, nontender, nondistended, + BS MS: no deformity or atrophy Skin: warm and dry, no rash Neuro:  Strength and sensation are intact Psych: euthymic mood, full affect    Recent Labs: 01/07/2021: TSH 0.615 04/03/2021: ALT 10 04/07/2021: Magnesium 1.9 04/28/2021: BUN 21; Creatinine, Ser 0.75; Hemoglobin 16.5; Platelets 254; Potassium 3.9; Sodium 131    Lipid Panel No results found for: CHOL, HDL, LDLCALC, TRIG    Wt Readings from Last 3 Encounters:  04/05/21 203 lb (92.1 kg)  03/01/21 203 lb 8 oz (92.3 kg)  02/04/21 205 lb 6 oz (93.2 kg)       ASSESSMENT AND PLAN:  Problem List Items Addressed This Visit   None  Paroxysmal atrial fibrillation Maintaining normal sinus rhythm Beta-blocker previously held for bradycardia He reports prescription ran out for the Eliquis, this has been renewed  Essential hypertension Blood pressure elevated on today's visit, recommend they buy blood pressure cuff as he is unable to ambulate and get checked at a pharmacy We will stop the spironolactone and potassium He stopped Lasix when prescription ran out Appears relatively euvolemic We will start losartan 100 mg daily Recommend follow-up BMP with primary care  Debility Walking with a cane, recommended walking program, Wife who presents with him reports he is very inactive   Total encounter time more than 25 minutes  Greater than 50% was  spent in counseling and coordination of care with the patient    Signed, Esmond Plants, M.D., Ph.D. [place], [place]

## 2021-08-31 ENCOUNTER — Ambulatory Visit: Payer: Medicare Other | Admitting: Cardiovascular Disease

## 2021-08-31 DIAGNOSIS — I10 Essential (primary) hypertension: Secondary | ICD-10-CM

## 2021-08-31 DIAGNOSIS — I5032 Chronic diastolic (congestive) heart failure: Secondary | ICD-10-CM

## 2021-08-31 DIAGNOSIS — I4819 Other persistent atrial fibrillation: Secondary | ICD-10-CM

## 2021-08-31 DIAGNOSIS — R001 Bradycardia, unspecified: Secondary | ICD-10-CM

## 2021-09-01 ENCOUNTER — Encounter: Payer: Self-pay | Admitting: Cardiovascular Disease

## 2021-09-29 ENCOUNTER — Other Ambulatory Visit: Payer: Self-pay | Admitting: Family Medicine

## 2021-09-29 DIAGNOSIS — M19011 Primary osteoarthritis, right shoulder: Secondary | ICD-10-CM

## 2021-10-11 ENCOUNTER — Ambulatory Visit
Admission: RE | Admit: 2021-10-11 | Discharge: 2021-10-11 | Disposition: A | Discharge status: Home / Self Care | Payer: Medicare Other | Source: Ambulatory Visit | Attending: Family Medicine | Admitting: Family Medicine

## 2021-10-11 DIAGNOSIS — M19011 Primary osteoarthritis, right shoulder: Secondary | ICD-10-CM

## 2022-03-08 ENCOUNTER — Other Ambulatory Visit: Payer: Self-pay

## 2022-03-08 ENCOUNTER — Emergency Department: Payer: Medicare Other

## 2022-03-08 ENCOUNTER — Emergency Department
Admission: EM | Admit: 2022-03-08 | Discharge: 2022-03-08 | Disposition: A | Discharge status: Home / Self Care | Payer: Medicare Other | Attending: Emergency Medicine | Admitting: Emergency Medicine

## 2022-03-08 DIAGNOSIS — S51012A Laceration without foreign body of left elbow, initial encounter: Secondary | ICD-10-CM | POA: Diagnosis present

## 2022-03-08 DIAGNOSIS — S0990XA Unspecified injury of head, initial encounter: Secondary | ICD-10-CM | POA: Insufficient documentation

## 2022-03-08 DIAGNOSIS — W19XXXA Unspecified fall, initial encounter: Secondary | ICD-10-CM

## 2022-03-08 DIAGNOSIS — W010XXA Fall on same level from slipping, tripping and stumbling without subsequent striking against object, initial encounter: Secondary | ICD-10-CM | POA: Insufficient documentation

## 2022-03-08 NOTE — ED Provider Notes (Signed)
[facility] Provider Note   Event Date/Time   First MD Initiated Contact with Patient 03/08/22 2135     (approximate) History  Fall  HPI Steven Welch is a 81 y.o. male who presents after mechanical fall from standing while checking the mail today.  Patient states that he lost his footing while trying to get to his mailbox approximately 3 hours prior to arrival suffering a skin tear to the left elbow.  Patient denies any head trauma or loss of consciousness.  Patient denies any blood thinner use.  Patient [place] any subsequent loss of consciousness, altered mental status, nausea/vomiting, or difficulty with ambulation ROS: Patient currently denies any vision changes, tinnitus, difficulty speaking, facial droop, sore throat, chest pain, shortness of breath, abdominal pain, nausea/vomiting/diarrhea, dysuria, or weakness/numbness/paresthesias in any extremity   Physical Exam  Triage Vital Signs: ED Triage Vitals  Enc Vitals Group     BP 03/08/22 1854 (!) 175/91     Pulse Rate 03/08/22 1854 (!) 45     Resp 03/08/22 1854 16     Temp 03/08/22 1854 98.6 F (37 C)     Temp Source 03/08/22 1854 Oral     SpO2 03/08/22 1854 94 %     Weight 03/08/22 1855 214 lb (97.1 kg)     Height 03/08/22 1855 '5\' 10"'$  (1.778 m)     Head Circumference --      Peak Flow --      Pain Score 03/08/22 1901 2     Pain Loc --      Pain Edu? --      Excl. in [place]? --    Most recent vital signs: Vitals:   03/08/22 1854 03/08/22 2221  BP: (!) 175/91 (!) 152/67  Pulse: (!) 45 (!) 47  Resp: 16 18  Temp: 98.6 F (37 C) 98.5 F (36.9 C)  SpO2: 94% 97%   General: Awake, oriented x4. CV:  Good peripheral perfusion.  Resp:  Normal effort.  Abd:  No distention.  Other:  6 cm curvilinear skin tear to the left lateral elbow ED Results / Procedures / Treatments  Labs (all labs ordered are listed, but only abnormal results are displayed) Labs Reviewed - No data to display RADIOLOGY ED MD  interpretation: CT of the head without contrast interpreted by me shows no evidence of acute abnormalities including no intracerebral hemorrhage, obvious masses, or significant edema  CT of the cervical spine interpreted by me does not show any evidence of acute abnormalities including no acute fracture, malalignment, height loss, or dislocation  X-ray of the left elbow interpreted independently by me and does not show any evidence of acute abnormalities -Agree with radiology assessment Official radiology report(s): CT Head Wo Contrast  Result Date: 03/08/2022 CLINICAL DATA:  Fall EXAM: CT HEAD WITHOUT CONTRAST CT CERVICAL SPINE WITHOUT CONTRAST TECHNIQUE: Multidetector CT imaging of the head and cervical spine was performed following the standard protocol without intravenous contrast. Multiplanar CT image reconstructions of the cervical spine were also generated. RADIATION DOSE REDUCTION: This exam was performed according to the departmental dose-optimization program which includes automated exposure control, adjustment of the mA and/or kV according to patient size and/or use of iterative reconstruction technique. COMPARISON:  None Available. FINDINGS: CT HEAD FINDINGS Brain: There is no mass, hemorrhage or extra-axial collection. Markedly advanced atrophy. There is hypoattenuation of the periventricular white matter, most commonly indicating chronic ischemic microangiopathy. Vascular: No abnormal hyperdensity of the major intracranial arteries or dural venous sinuses.  No intracranial atherosclerosis. Skull: The visualized skull base, calvarium and extracranial soft tissues are normal. Sinuses/Orbits: No fluid levels or advanced mucosal thickening of the visualized paranasal sinuses. No mastoid or middle ear effusion. The orbits are normal. CT CERVICAL SPINE FINDINGS Alignment: No static subluxation. Facets are aligned. Occipital condyles are normally positioned. Skull base and vertebrae: No acute  fracture. Soft tissues and spinal canal: No prevertebral fluid or swelling. No visible canal hematoma. Disc levels: No advanced spinal canal or neural foraminal stenosis. Upper chest: No pneumothorax, pulmonary nodule or pleural effusion. Other: Normal visualized paraspinal cervical soft tissues. IMPRESSION: 1. No acute intracranial abnormality. 2. 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 3. No acute fracture or static subluxation of the cervical spine. Electronically Signed   By: Ulyses Jarred M.D.   On: 03/08/2022 20:01   CT Cervical Spine Wo Contrast  Result Date: 03/08/2022 CLINICAL DATA:  Fall EXAM: CT HEAD WITHOUT CONTRAST CT CERVICAL SPINE WITHOUT CONTRAST TECHNIQUE: Multidetector CT imaging of the head and cervical spine was performed following the standard protocol without intravenous contrast. Multiplanar CT image reconstructions of the cervical spine were also generated. RADIATION DOSE REDUCTION: This exam was performed according to the departmental dose-optimization program which includes automated exposure control, adjustment of the mA and/or kV according to patient size and/or use of iterative reconstruction technique. COMPARISON:  None Available. FINDINGS: CT HEAD FINDINGS Brain: There is no mass, hemorrhage or extra-axial collection. Markedly advanced atrophy. There is hypoattenuation of the periventricular white matter, most commonly indicating chronic ischemic microangiopathy. Vascular: No abnormal hyperdensity of the major intracranial arteries or dural venous sinuses. No intracranial atherosclerosis. Skull: The visualized skull base, calvarium and extracranial soft tissues are normal. Sinuses/Orbits: No fluid levels or advanced mucosal thickening of the visualized paranasal sinuses. No mastoid or middle ear effusion. The orbits are normal. CT CERVICAL SPINE FINDINGS Alignment: No static subluxation. Facets are aligned. Occipital condyles are normally  positioned. Skull base and vertebrae: No acute fracture. Soft tissues and spinal canal: No prevertebral fluid or swelling. No visible canal hematoma. Disc levels: No advanced spinal canal or neural foraminal stenosis. Upper chest: No pneumothorax, pulmonary nodule or pleural effusion. Other: Normal visualized paraspinal cervical soft tissues. IMPRESSION: 1. No acute intracranial abnormality. 2. 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 3. No acute fracture or static subluxation of the cervical spine. Electronically Signed   By: Ulyses Jarred M.D.   On: 03/08/2022 20:01   DG Elbow Complete Left  Result Date: 03/08/2022 CLINICAL DATA:  [place], injured left elbow EXAM: LEFT ELBOW - COMPLETE 3+ VIEW COMPARISON:  None Available. FINDINGS: Frontal, bilateral oblique, lateral views of the left elbow are obtained on 4 images. No acute displaced fracture, subluxation, or dislocation. Mild osteoarthritis. No joint effusion. Dorsal soft tissue swelling. IMPRESSION: 1. Dorsal soft tissue swelling.  No underlying fracture. Electronically Signed   By: Randa Ngo M.D.   On: 03/08/2022 19:38   PROCEDURES: Critical Care performed: No .1-3 Lead EKG Interpretation  Performed by: Naaman Plummer, MD Authorized by: Naaman Plummer, MD     Interpretation: normal     ECG rate:  57   ECG rate assessment: normal     Rhythm: sinus rhythm     Ectopy: none     Conduction: normal   ..Laceration Repair  Date/Time: 03/08/2022 10:31 PM  Performed by: Naaman Plummer, MD Authorized by: Naaman Plummer, MD   Consent:    Consent obtained:  Verbal  Consent given by:  Patient   Risks, benefits, and alternatives were discussed: yes     Risks discussed:  Infection, pain, retained foreign body, need for additional repair, poor cosmetic result, tendon damage, vascular damage, poor wound healing and nerve damage   Alternatives discussed:  No treatment, delayed treatment, observation and  referral Universal protocol:    Immediately prior to procedure, a time out was called: yes     Patient identity confirmed:  Verbally with patient Anesthesia:    Anesthesia method:  None Laceration details:    Location:  Shoulder/arm   Shoulder/arm location:  L elbow   Length (cm):  6   Depth (mm):  2 Pre-procedure details:    Preparation:  Patient was prepped and draped in usual sterile fashion Exploration:    Wound exploration: entire depth of wound visualized     Contaminated: no   Treatment:    Area cleansed with:  Povidone-iodine and saline   Amount of cleaning:  Standard   Irrigation solution:  Sterile saline   Irrigation method:  Syringe Skin repair:    Repair method:  Steri-Strips   Number of Steri-Strips:  5 Approximation:    Approximation:  Close Repair type:    Repair type:  Simple Post-procedure details:    Dressing:  Non-adherent dressing   Procedure completion:  Tolerated well, no immediate complications  MEDICATIONS ORDERED IN ED: Medications - No data to display IMPRESSION / MDM / [place] / ED COURSE  I reviewed the triage vital signs and the nursing notes.                              Patient's presentation is most consistent with acute presentation with potential threat to life or bodily function. Presenting after a fall that occurred just prior to arrival, resulting in injury to the left elbow. The mechanism of injury was a mechanical ground level fall without syncope or near-syncope. The current level of pain is moderate. There was no loss of consciousness, confusion, seizure, or memory impairment. There is a laceration associated with the injury. Denies neck pain. The patient does not take blood thinner medications. Denies vomiting, numbness/weakness, fever  Dispo: Discharge with PCP follow-up    FINAL CLINICAL IMPRESSION(S) / ED DIAGNOSES   Final diagnoses:  Fall, initial encounter  Skin tear of left elbow without complication,  initial encounter   Rx / DC Orders   ED Discharge Orders     None      Note:  This document was prepared using Dragon voice recognition software and may include unintentional dictation errors.   Naaman Plummer, MD 03/08/22 2231

## 2022-03-08 NOTE — ED Notes (Signed)
Pt Dc to home. Dc instructions reviewed with all questions answered. Pt voices understanding. Pt assisted out of dept via wheelchair.

## 2022-03-08 NOTE — ED Triage Notes (Signed)
Reports he was checking the mail and stumbled falling on left elbow.  Reports he was unable to get himself up.  Reports this happened around 4pm Denies hitting head or LOC

## 2022-03-08 NOTE — ED Provider Triage Note (Signed)
Emergency Medicine Provider Triage Evaluation Note  Deuntae Kocsis , a 81 y.o. male  was evaluated in triage.  Pt complains of left elbow pain after a mechanical fall today with associated skin tear.  Patient is currently anticoagulated with Eliquis.  No chest pain or abdominal pain.  Review of Systems  Positive: Patient has skin tear.  Negative: No chest pain or abdominal pain.   Physical Exam  BP (!) 175/91 (BP Location: Right Arm)   Pulse (!) 45   Temp 98.6 F (37 C) (Oral)   Resp 16   Ht '5\' 10"'$  (1.778 m)   Wt 97.1 kg   SpO2 94%   BMI 30.71 kg/m  Gen:   Awake, no distress   Resp:  Normal effort  MSK:   Moves extremities without difficulty  Other:    Medical Decision Making  Medically screening exam initiated at 6:56 PM.  Appropriate orders placed.  Bohden Dung was informed that the remainder of the evaluation will be completed by another provider, this initial triage assessment does not replace that evaluation, and the importance of remaining in the ED until their evaluation is complete.     Vallarie Mare [place], [place] 03/08/22 1856

## 2022-06-30 ENCOUNTER — Other Ambulatory Visit: Payer: Self-pay | Admitting: Sports Medicine

## 2022-06-30 DIAGNOSIS — G8929 Other chronic pain: Secondary | ICD-10-CM

## 2022-06-30 DIAGNOSIS — M19012 Primary osteoarthritis, left shoulder: Secondary | ICD-10-CM

## 2022-07-11 ENCOUNTER — Other Ambulatory Visit: Payer: Medicare Other

## 2022-07-13 ENCOUNTER — Ambulatory Visit
Admission: RE | Admit: 2022-07-13 | Discharge: 2022-07-13 | Disposition: A | Discharge status: Home / Self Care | Payer: Medicare Other | Source: Ambulatory Visit | Attending: Sports Medicine | Admitting: Sports Medicine

## 2022-07-13 DIAGNOSIS — M25512 Pain in left shoulder: Secondary | ICD-10-CM | POA: Insufficient documentation

## 2022-07-13 DIAGNOSIS — M25511 Pain in right shoulder: Secondary | ICD-10-CM | POA: Diagnosis present

## 2022-07-13 DIAGNOSIS — G8929 Other chronic pain: Secondary | ICD-10-CM

## 2022-07-13 DIAGNOSIS — M19012 Primary osteoarthritis, left shoulder: Secondary | ICD-10-CM | POA: Diagnosis present

## 2022-08-17 ENCOUNTER — Emergency Department: Payer: Medicare Other

## 2022-08-17 ENCOUNTER — Other Ambulatory Visit: Payer: Self-pay

## 2022-08-17 ENCOUNTER — Emergency Department
Admission: EM | Admit: 2022-08-17 | Discharge: 2022-08-18 | Disposition: A | Discharge status: Home / Self Care | Payer: Medicare Other | Attending: Emergency Medicine | Admitting: Emergency Medicine

## 2022-08-17 DIAGNOSIS — W07XXXA Fall from chair, initial encounter: Secondary | ICD-10-CM | POA: Insufficient documentation

## 2022-08-17 DIAGNOSIS — I4510 Unspecified right bundle-branch block: Secondary | ICD-10-CM | POA: Diagnosis not present

## 2022-08-17 DIAGNOSIS — W19XXXA Unspecified fall, initial encounter: Secondary | ICD-10-CM

## 2022-08-17 DIAGNOSIS — R001 Bradycardia, unspecified: Secondary | ICD-10-CM | POA: Insufficient documentation

## 2022-08-17 DIAGNOSIS — R296 Repeated falls: Secondary | ICD-10-CM | POA: Insufficient documentation

## 2022-08-17 DIAGNOSIS — Z7901 Long term (current) use of anticoagulants: Secondary | ICD-10-CM | POA: Insufficient documentation

## 2022-08-17 DIAGNOSIS — Y92009 Unspecified place in unspecified non-institutional (private) residence as the place of occurrence of the external cause: Secondary | ICD-10-CM | POA: Insufficient documentation

## 2022-08-17 DIAGNOSIS — R0789 Other chest pain: Secondary | ICD-10-CM | POA: Insufficient documentation

## 2022-08-17 DIAGNOSIS — I10 Essential (primary) hypertension: Secondary | ICD-10-CM | POA: Insufficient documentation

## 2022-08-17 LAB — CBC WITH DIFFERENTIAL/PLATELET
Abs Immature Granulocytes: 0.07 10*3/uL (ref 0.00–0.07)
Basophils Absolute: 0.1 10*3/uL (ref 0.0–0.1)
Basophils Relative: 1 %
Eosinophils Absolute: 0.3 10*3/uL (ref 0.0–0.5)
Eosinophils Relative: 3 %
HCT: 49.1 % (ref 39.0–52.0)
Hemoglobin: 16 g/dL (ref 13.0–17.0)
Immature Granulocytes: 1 %
Lymphocytes Relative: 17 %
Lymphs Abs: 1.6 10*3/uL (ref 0.7–4.0)
MCH: 31.3 pg (ref 26.0–34.0)
MCHC: 32.6 g/dL (ref 30.0–36.0)
MCV: 96.1 fL (ref 80.0–100.0)
Monocytes Absolute: 1 10*3/uL (ref 0.1–1.0)
Monocytes Relative: 10 %
Neutro Abs: 6.4 10*3/uL (ref 1.7–7.7)
Neutrophils Relative %: 68 %
Platelets: 221 10*3/uL (ref 150–400)
RBC: 5.11 MIL/uL (ref 4.22–5.81)
RDW: 12.6 % (ref 11.5–15.5)
WBC: 9.4 10*3/uL (ref 4.0–10.5)
nRBC: 0 % (ref 0.0–0.2)

## 2022-08-17 LAB — COMPREHENSIVE METABOLIC PANEL
ALT: 24 U/L (ref 0–44)
AST: 23 U/L (ref 15–41)
Albumin: 3.9 g/dL (ref 3.5–5.0)
Alkaline Phosphatase: 50 U/L (ref 38–126)
Anion gap: 7 (ref 5–15)
BUN: 23 mg/dL (ref 8–23)
CO2: 25 mmol/L (ref 22–32)
Calcium: 8.6 mg/dL — ABNORMAL LOW (ref 8.9–10.3)
Chloride: 104 mmol/L (ref 98–111)
Creatinine, Ser: 1.12 mg/dL (ref 0.61–1.24)
GFR, Estimated: >60 mL/min (ref >60)
Glucose, Bld: 127 mg/dL — ABNORMAL HIGH (ref 70–99)
Potassium: 3.9 mmol/L (ref 3.5–5.1)
Sodium: 136 mmol/L (ref 135–145)
Total Bilirubin: 0.7 mg/dL (ref 0.3–1.2)
Total Protein: 7.1 g/dL (ref 6.5–8.1)

## 2022-08-17 LAB — TROPONIN I (HIGH SENSITIVITY): Troponin I (High Sensitivity): 11 ng/L (ref <18)

## 2022-08-17 NOTE — ED Provider Notes (Addendum)
[facility] Provider Note    Event Date/Time   First MD Initiated Contact with Patient 08/17/22 2350     (approximate)   History   Fall   HPI  Steven Welch is a 82 y.o. male who presents to the ED for evaluation of Fall   Review of cardiology clinic visit from December.  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  Discussions regarding a pacemaker  Patient presents to the ED with his wife after a fall.  They report that patient was at home and was in his recliner for a while, after getting up in the recliner he was feeling somewhat "woozy" and stumbled to the side and fell onto his side.  No syncope or seizure activity.  The wife tried to catch him because she was nearby, but sounds like they toppled together.  Wife reports that he has been doing this and has been more unsteady and falling for the past 1+ year.   Patient reports pain to the left side of his chest after the fall.  No abdominal pain, headache, vision changes.  No chest pain substernally or chest pain prior to or around the fall. 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  Physical Exam   Triage Vital Signs: ED Triage Vitals [08/17/22 2117]  Enc Vitals Group     BP (!) 179/88     Pulse Rate (!) 46     Resp 18     Temp 98.5 F (36.9 C)     Temp Source Oral     SpO2 96 %     Weight 221 lb (100.2 kg)     Height 5\' 10"  (1.778 m)     Head Circumference      Peak Flow      Pain Score 0     Pain Loc      Pain Edu?      Excl. in [place]?     Most recent vital signs: Vitals:   08/17/22 2117 08/18/22 0000  BP: (!) 179/88 (!) 187/89  Pulse: (!) 46 (!) 45  Resp: 18 16  Temp: 98.5 F (36.9 C)   SpO2: 96% 99%    General: Awake, no distress.  CV:  Good peripheral perfusion.  Resp:  Normal effort.  Abd:  No distention.  MSK:  No deformity noted.  Palpation of all 4 extremities  without evidence of deformity, tenderness or trauma. Has some mild tenderness to the left-sided lateral chest wall without step-offs or bruising or overlying signs of trauma. Neuro:  No focal deficits appreciated. Cranial nerves II through XII intact 5/5 strength and sensation in all 4 extremities Other:     ED Results / Procedures / Treatments   Labs (all labs ordered are listed, but only abnormal results are displayed) Labs Reviewed  COMPREHENSIVE METABOLIC PANEL - Abnormal; Notable for the following components:      Result Value   Glucose, Bld 127 (*)    Calcium 8.6 (*)    All other components within normal limits  CBC WITH DIFFERENTIAL/PLATELET  URINALYSIS, ROUTINE W REFLEX MICROSCOPIC  TROPONIN I (HIGH SENSITIVITY)  TROPONIN I (HIGH SENSITIVITY)    EKG Sinus bradycardia with a rate of 46 bpm.  Right bundle.  No STEMI.  No high-grade AV block.  RADIOLOGY CT head interpreted by me without evidence of acute intracranial pathology CXR interpreted by me  without evidence of acute cardiopulmonary pathology.  Official radiology report(s): DG Chest 2 View  Result Date: 08/18/2022 CLINICAL DATA:  Fall, left chest pain EXAM: CHEST - 2 VIEW COMPARISON:  04/03/2021 FINDINGS: Mild eventration of the hemidiaphragms bilaterally, unchanged. Lungs are clear. No pneumothorax or pleural effusion. Cardiac size within normal limits. Pulmonary vascularity is normal. No acute bone abnormality. Degenerative changes are noted within the shoulders bilaterally. IMPRESSION: 1. No active cardiopulmonary disease. Electronically Signed   By: Helyn Numbers M.D.   On: 08/18/2022 01:00   CT HEAD WO CONTRAST ( )  Result Date: 08/17/2022 CLINICAL DATA:  Ground level fall EXAM: CT HEAD WITHOUT CONTRAST TECHNIQUE: Contiguous axial images were obtained from the base of the skull through the vertex without intravenous contrast. RADIATION DOSE REDUCTION: This exam was performed according to the departmental  dose-optimization program which includes automated exposure control, adjustment of the mA and/or kV according to patient size and/or use of iterative reconstruction technique. COMPARISON:  CT head 03/08/2022 FINDINGS: Brain: No intracranial hemorrhage, mass effect, or evidence of acute infarct. No hydrocephalus. No extra-axial fluid collection. Advanced generalized cerebral atrophy. Ill-defined hypoattenuation within the cerebral white matter is nonspecific but consistent with chronic small vessel ischemic disease. Vascular: No hyperdense vessel. Intracranial arterial calcification. Skull: No fracture or focal lesion. Sinuses/Orbits: No acute finding. Paranasal sinuses and mastoid air cells are well aerated. Other: None. IMPRESSION: No acute intracranial abnormality. Electronically Signed   By: Minerva Fester M.D.   On: 08/17/2022 21:43    PROCEDURES and INTERVENTIONS:  .1-3 Lead EKG Interpretation  Performed by: Delton Prairie, MD Authorized by: Delton Prairie, MD     Interpretation: normal     ECG rate:  48   ECG rate assessment: bradycardic     Rhythm: sinus bradycardia     Ectopy: none     Conduction: normal     Medications  lidocaine (LIDODERM) 5 % 1 patch (1 patch Transdermal Patch Applied 08/18/22 0018)  acetaminophen (TYLENOL) tablet 1,000 mg (1,000 mg Oral Given 08/18/22 0018)     IMPRESSION / MDM / ASSESSMENT AND PLAN / ED COURSE  I reviewed the triage vital signs and the nursing notes.  Differential diagnosis includes, but is not limited to, high-grade AV block or other cardiac dysrhythmia, seizure, syncope, vasovagal episode, rib fracture, pneumothorax, ICH  {Patient presents with symptoms of an acute illness or injury that is potentially life-threatening.  82 year old male presents after a fall without evidence of significant acute trauma and ultimately suitable for outpatient management.  He is anticoagulated and CT head is reassuring.  X-ray without rib fracture or pneumothorax  considering his left chest wall discomfort after falling on it.  Screening blood work is benign with a normal CBC, metabolic panel and troponin.  I doubt ACS.  Remains in sinus bradycardia without high-grade AV block, consistent with reported rhythms and previous outpatient cardiology visits.  While I considered observation admission for this patient, after benign exam and workup, improving pain with Tylenol/lidocaine patch, I think is reasonable to discharge with the same with close return precautions.  Clinical Course as of 08/18/22 0118  Thu Aug 18, 2022  0117 Reassessed.  Patient reports pain is much improved with the lidocaine patch and Tylenol.  Wife shows me a video from their home camera/monitor of the event.  She reports that "he always does this and it is hard because the daughter's live way."  She reports this was a fairly typical event and is comfortable taking him home. [DS]  Clinical Course User Index [DS] Delton Prairie, MD     FINAL CLINICAL IMPRESSION(S) / ED DIAGNOSES   Final diagnoses:  Fall, initial encounter     Rx / DC Orders   ED Discharge Orders          Ordered    lidocaine (LIDODERM) 5 %  Every 12 hours        08/18/22 0103             Note:  This document was prepared using Dragon voice recognition software and may include unintentional dictation errors.   Delton Prairie, MD 08/18/22 Lyda Jester    Delton Prairie, MD 08/18/22 [phone]

## 2022-08-17 NOTE — ED Triage Notes (Signed)
EMS brings pt in from home for reports of a fall due to dizziness

## 2022-08-17 NOTE — ED Triage Notes (Addendum)
Pt presents to ER via ems from home after pt fell while trying to get up from chair.  Pt states when he was trying to get up from the chair. He "stumbled around and fell."  Pt states he started to feel a little dizzy upon standing up.  Denies any injuries or pain at this time.  Ems did ECG, which showed bradycardia, which pt denies having any pmh of.  Pt otherwise A&Ox3 and in NAD in triage.   Pt is extremely hard of hearing.

## 2022-08-17 NOTE — ED Provider Notes (Incomplete)
[facility] Provider Note    Event Date/Time   First MD Initiated Contact with Patient 08/17/22 2350     (approximate)   History   Fall   HPI  Steven Welch is a 82 y.o. male who presents to the ED for evaluation of Fall   Review of cardiology clinic visit from December.  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  Discussions regarding a pacemaker   Physical Exam   Triage Vital Signs: ED Triage Vitals [08/17/22 2117]  Enc Vitals Group     BP (!) 179/88     Pulse Rate (!) 46     Resp 18     Temp 98.5 F (36.9 C)     Temp Source Oral     SpO2 96 %     Weight 221 lb (100.2 kg)     Height 5\' 10"  (1.778 m)     Head Circumference      Peak Flow      Pain Score 0     Pain Loc      Pain Edu?      Excl. in [place]?     Most recent vital signs: Vitals:   08/17/22 2117  BP: (!) 179/88  Pulse: (!) 46  Resp: 18  Temp: 98.5 F (36.9 C)  SpO2: 96%    General: Awake, no distress. *** CV:  Good peripheral perfusion.  Resp:  Normal effort.  Abd:  No distention.  MSK:  No deformity noted.  Neuro:  No focal deficits appreciated. Other:     ED Results / Procedures / Treatments   Labs (all labs ordered are listed, but only abnormal results are displayed) Labs Reviewed  COMPREHENSIVE METABOLIC PANEL - Abnormal; Notable for the following components:      Result Value   Glucose, Bld 127 (*)    Calcium 8.6 (*)    All other components within normal limits  CBC WITH DIFFERENTIAL/PLATELET  URINALYSIS, ROUTINE W REFLEX MICROSCOPIC  TROPONIN I (HIGH SENSITIVITY)  TROPONIN I (HIGH SENSITIVITY)    EKG ***  RADIOLOGY ***  Official radiology report(s): CT HEAD WO CONTRAST ( )  Result Date: 08/17/2022 CLINICAL DATA:  Ground level fall EXAM: CT HEAD WITHOUT CONTRAST TECHNIQUE: Contiguous axial images were obtained from the base of the skull through the vertex without  intravenous contrast. RADIATION DOSE REDUCTION: This exam was performed according to the departmental dose-optimization program which includes automated exposure control, adjustment of the mA and/or kV according to patient size and/or use of iterative reconstruction technique. COMPARISON:  CT head 03/08/2022 FINDINGS: Brain: No intracranial hemorrhage, mass effect, or evidence of acute infarct. No hydrocephalus. No extra-axial fluid collection. Advanced generalized cerebral atrophy. Ill-defined hypoattenuation within the cerebral white matter is nonspecific but consistent with chronic small vessel ischemic disease. Vascular: No hyperdense vessel. Intracranial arterial calcification. Skull: No fracture or focal lesion. Sinuses/Orbits: No acute finding. Paranasal sinuses and mastoid air cells are well aerated. Other: None. IMPRESSION: No acute intracranial abnormality. Electronically Signed   By: Minerva Fester M.D.   On: 08/17/2022 21:43    PROCEDURES and INTERVENTIONS:  Procedures  Medications - No data to display   IMPRESSION / MDM / ASSESSMENT AND PLAN / ED COURSE  I reviewed the triage vital signs and the nursing notes.  Differential diagnosis includes, but is not limited to, ***  {Patient presents with symptoms of an acute illness or injury that is potentially  life-threatening.}      FINAL CLINICAL IMPRESSION(S) / ED DIAGNOSES   Final diagnoses:  None     Rx / DC Orders   ED Discharge Orders     None        Note:  This document was prepared using Dragon voice recognition software and may include unintentional dictation errors.

## 2022-08-18 ENCOUNTER — Emergency Department: Payer: Medicare Other

## 2022-08-18 MED ORDER — LIDOCAINE 5 % EX PTCH: 1.0000 | MEDICATED_PATCH | Freq: Two times a day (BID) | CUTANEOUS | 0 refills | Status: DC

## 2022-08-18 MED ORDER — LIDOCAINE 5 % EX PTCH
1.0000 | MEDICATED_PATCH | CUTANEOUS | Status: DC
  Administered 2022-08-18: 1 via TRANSDERMAL
  Filled 2022-08-18: qty 1

## 2022-08-18 MED ORDER — ACETAMINOPHEN 500 MG PO TABS
1000.0000 mg | ORAL_TABLET | Freq: Once | ORAL | Status: AC
  Administered 2022-08-18: 1000 mg via ORAL
  Filled 2022-08-18: qty 2

## 2022-08-18 NOTE — Discharge Instructions (Addendum)
Be careful and moves slowly changing positions to help reduce dizziness. Follow-up with your cardiologist to discuss if the pacemaker is necessary  Return to the ED with any episodes of passing out or other big falls.  Use Tylenol for pain and fevers.  Up to 1000 mg per dose, up to 4 times per day.  Do not take more than 4000 mg of Tylenol/acetaminophen within 24 hours..  Please use lidocaine patches at your site of pain.  Apply 1 patch at a time, leave on for 12 hours, then remove for 12 hours.  12 hours on, 12 hours off.  Do not apply more than 1 patch at a time.

## 2022-09-01 ENCOUNTER — Other Ambulatory Visit: Payer: Self-pay | Admitting: Surgery

## 2022-09-09 ENCOUNTER — Encounter
Admission: RE | Admit: 2022-09-09 | Discharge: 2022-09-09 | Disposition: A | Discharge status: Home / Self Care | Payer: Medicare Other | Source: Ambulatory Visit | Attending: Surgery | Admitting: Surgery

## 2022-09-09 ENCOUNTER — Other Ambulatory Visit: Payer: Self-pay

## 2022-09-09 VITALS — BP 175/87 | HR 50 | Resp 18 | Wt 222.9 lb

## 2022-09-09 DIAGNOSIS — Z01812 Encounter for preprocedural laboratory examination: Secondary | ICD-10-CM | POA: Insufficient documentation

## 2022-09-09 LAB — TYPE AND SCREEN
ABO/RH(D): O POS
Antibody Screen: NEGATIVE

## 2022-09-09 LAB — URINALYSIS, ROUTINE W REFLEX MICROSCOPIC
Bacteria, UA: NONE SEEN
Bilirubin Urine: NEGATIVE
Glucose, UA: NEGATIVE mg/dL
Hgb urine dipstick: NEGATIVE
Ketones, ur: NEGATIVE mg/dL
Leukocytes,Ua: NEGATIVE
Nitrite: NEGATIVE
Protein, ur: 100 mg/dL — AB
Specific Gravity, Urine: 1.018 (ref 1.005–1.030)
Squamous Epithelial / HPF: NONE SEEN /HPF (ref 0–5)
pH: 5 (ref 5.0–8.0)

## 2022-09-09 LAB — SURGICAL PCR SCREEN
MRSA, PCR: NEGATIVE
Staphylococcus aureus: NEGATIVE

## 2022-09-09 NOTE — Patient Instructions (Addendum)
Your procedure is scheduled on: 09/22/22 - Thursday Report to the Registration Desk on the 1st floor of the [facility]. To find out your arrival time, please call [phone] between 1PM - 3PM on: 09/21/22 - Wednesday If your arrival time is 6:00 am, do not arrive before that time as the [facility] entrance doors do not open until 6:00 am.  REMEMBER: Instructions that are not followed completely may result in serious medical risk, up to and including death; or upon the discretion of your surgeon and anesthesiologist your surgery may need to be rescheduled.  Do not eat food after midnight the night before surgery.  No gum chewing or hard candies.  You may however, drink CLEAR liquids up to 2 hours before you are scheduled to arrive for your surgery. Do not drink anything within 2 hours of your scheduled arrival time.  Clear liquids include: - water  - apple juice without pulp - gatorade (not RED colors) - black coffee or tea (Do NOT add milk or creamers to the coffee or tea) Do NOT drink anything that is not on this list.  In addition, your doctor has ordered for you to drink the provided:  Ensure Pre-Surgery Clear Carbohydrate Drink  Drinking this carbohydrate drink up to two hours before surgery helps to reduce insulin resistance and improve patient outcomes. Please complete drinking 2 hours before scheduled arrival time.  One week prior to surgery: Stop Anti-inflammatories (NSAIDS) such as Advil, Aleve, Ibuprofen, Motrin, Naproxen, Naprosyn and Aspirin based products such as Excedrin, Goody's Powder, BC Powder.  Stop taking beginning 09/15/22 , ANY OVER THE COUNTER supplements until after surgery.  You may however, continue to take Tylenol if needed for pain up until the day of surgery.  Continue taking all prescribed medications with the exception of the following:  1- apixaban (ELIQUIS) stop beginning 09/19/22.  TAKE ONLY THESE MEDICATIONS THE MORNING OF SURGERY WITH A  SIP OF WATER:  metoprolol tartrate (LOPRESSOR)  hydrALAZINE (APRESOLINE) if needed.    No Alcohol for 24 hours before or after surgery.  No Smoking including e-cigarettes for 24 hours before surgery.  No chewable tobacco products for at least 6 hours before surgery.  No nicotine patches on the day of surgery.  Do not use any "recreational" drugs for at least a week (preferably 2 weeks) before your surgery.  Please be advised that the combination of cocaine and anesthesia may have negative outcomes, up to and including death. If you test positive for cocaine, your surgery will be cancelled.  On the morning of surgery brush your teeth with toothpaste and water, you may rinse your mouth with mouthwash if you wish. Do not swallow any toothpaste or mouthwash.  Use CHG Soap or wipes as directed on instruction sheet.  Do not wear jewelry, make-up, hairpins, clips or nail polish.  Do not wear lotions, powders, or perfumes.   Do not shave body hair from the neck down 48 hours before surgery.  Contact lenses, hearing aids and dentures may not be worn into surgery.  Do not bring valuables to the hospital. [facility]  is not responsible for any missing/lost belongings or valuables.   Total Shoulder Arthroplasty:  use Benzoyl Peroxide 5% Gel as directed on instruction sheet.  Notify your doctor if there is any change in your medical condition (cold, fever, infection).  Wear comfortable clothing (specific to your surgery type) to the hospital.  After surgery, you can help prevent lung complications by doing breathing exercises.  Take deep breaths and cough every 1-2 hours. Your doctor may order a device called an Incentive Spirometer to help you take deep breaths. When coughing or sneezing, hold a pillow firmly against your incision with both hands. This is called "splinting." Doing this helps protect your incision. It also decreases belly discomfort.  If you are being admitted to the  hospital overnight, leave your suitcase in the car. After surgery it may be brought to your room.  In case of increased patient census, it may be necessary for you, the patient, to continue your postoperative care in the Same Day Surgery department.  If you are being discharged the day of surgery, you will not be allowed to drive home. You will need a responsible individual to drive you home and stay with you for 24 hours after surgery.   If you are taking public transportation, you will need to have a responsible individual with you.  Please call the Pre-admissions Testing Dept. at [phone] if you have any questions about these instructions.  Surgery Visitation Policy:  Patients having surgery or a procedure may have two visitors.  Children under the age of 19 must have an adult with them who is not the patient.  Inpatient Visitation:    Visiting hours are 7 a.m. to 8 p.m. Up to four visitors are allowed at one time in a patient room. The visitors may rotate out with other people during the day.  One visitor age 73 or older may stay with the patient overnight and must be in the room by 8 p.m.   Pre-operative 5 CHG Bath Instructions   You can play a key role in reducing the risk of infection after surgery. Your skin needs to be as free of germs as possible. You can reduce the number of germs on your skin by washing with CHG (chlorhexidine gluconate) soap before surgery. CHG is an antiseptic soap that kills germs and continues to kill germs even after washing.   DO NOT use if you have an allergy to chlorhexidine/CHG or antibacterial soaps. If your skin becomes reddened or irritated, stop using the CHG and notify one of our RNs at [phone].   Please shower with the CHG soap starting 4 days before surgery using the following schedule:     Please keep in mind the following:  DO NOT shave, including legs and underarms, starting the day of your first shower.   You may shave  your face at any point before/day of surgery.  Place clean sheets on your bed the day you start using CHG soap. Use a clean washcloth (not used since being washed) for each shower. DO NOT sleep with pets once you start using the CHG.   CHG Shower Instructions:  If you choose to wash your hair and private area, wash first with your normal shampoo/soap.  After you use shampoo/soap, rinse your hair and body thoroughly to remove shampoo/soap residue.  Turn the water OFF and apply about 3 tablespoons (45 ml) of CHG soap to a CLEAN washcloth.  Apply CHG soap ONLY FROM YOUR NECK DOWN TO YOUR TOES (washing for 3-5 minutes)  DO NOT use CHG soap on face, private areas, open wounds, or sores.  Pay special attention to the area where your surgery is being performed.  If you are having back surgery, having someone wash your back for you may be helpful. Wait 2 minutes after CHG soap is applied, then you may rinse off the CHG soap.  Pat dry with a clean towel  Put on clean clothes/pajamas   If you choose to wear lotion, please use ONLY the CHG-compatible lotions on the back of this paper.     Additional instructions for the day of surgery: DO NOT APPLY any lotions, deodorants, cologne, or perfumes.   Put on clean/comfortable clothes.  Brush your teeth.  Ask your nurse before applying any prescription medications to the skin.      CHG Compatible Lotions   Aveeno Moisturizing lotion  Cetaphil Moisturizing Cream  Cetaphil Moisturizing Lotion  Clairol Herbal Essence Moisturizing Lotion, Dry Skin  Clairol Herbal Essence Moisturizing Lotion, Extra Dry Skin  Clairol Herbal Essence Moisturizing Lotion, Normal Skin  Curel Age Defying Therapeutic Moisturizing Lotion with Alpha Hydroxy  Curel Extreme Care Body Lotion  Curel Soothing Hands Moisturizing Hand Lotion  Curel Therapeutic Moisturizing Cream, Fragrance-Free  Curel Therapeutic Moisturizing Lotion, Fragrance-Free  Curel Therapeutic Moisturizing  Lotion, Original Formula  Eucerin Daily Replenishing Lotion  Eucerin Dry Skin Therapy Plus Alpha Hydroxy Crme  Eucerin Dry Skin Therapy Plus Alpha Hydroxy Lotion  Eucerin Original Crme  Eucerin Original Lotion  Eucerin Plus Crme Eucerin Plus Lotion  Eucerin TriLipid Replenishing Lotion  Keri Anti-Bacterial Hand Lotion  Keri Deep Conditioning Original Lotion Dry Skin Formula Softly Scented  Keri Deep Conditioning Original Lotion, Fragrance Free Sensitive Skin Formula  Keri Lotion Fast Absorbing Fragrance Free Sensitive Skin Formula  Keri Lotion Fast Absorbing Softly Scented Dry Skin Formula  Keri Original Lotion  Keri Skin Renewal Lotion Keri Silky Smooth Lotion  Keri Silky Smooth Sensitive Skin Lotion  Nivea Body Creamy Conditioning Oil  Nivea Body Extra Enriched Lotion  Nivea Body Original Lotion  Nivea Body Sheer Moisturizing Lotion Nivea Crme  Nivea Skin Firming Lotion  NutraDerm 30 Skin Lotion  NutraDerm Skin Lotion  NutraDerm Therapeutic Skin Cream  NutraDerm Therapeutic Skin Lotion  ProShield Protective Hand Cream  Provon moisturizing lotion  Preparing for Total Shoulder Arthroplasty  Before surgery, you can play an important role by reducing the number of germs on your skin by using the following products:  Benzoyl Peroxide Gel  o Reduces the number of germs present on the skin  o Applied twice a day to shoulder area starting two days before surgery  Chlorhexidine Gluconate (CHG) Soap  o An antiseptic cleaner that kills germs and bonds with the skin to continue killing germs even after washing  o Used for showering the night before surgery and morning of surgery   BENZOYL PEROXIDE 5% GEL  Please do not use if you have an allergy to benzoyl peroxide. If your skin becomes reddened/irritated stop using the benzoyl peroxide.  Starting two days before surgery, apply as follows:  1. Apply benzoyl peroxide in the morning and at night. Apply after taking a  shower. If you are not taking a shower, clean entire shoulder front, back, and side along with the armpit with a clean wet washcloth.  2. Place a quarter-sized dollop on your shoulder and rub in thoroughly, making sure to cover the front, back, and side of your shoulder, along with the armpit.  2 days before ___x_ AM __x__ PM 1 day before __x__ AM ___x_ PM  3. Do this twice a day for two days. (Last application is the night before surgery, AFTER using the CHG soap).  4. Do NOT apply benzoyl peroxide gel on the day of surgery.  How to Use an Incentive Spirometer  An incentive spirometer is a tool that measures how  well you are filling your lungs with each breath. Learning to take long, deep breaths using this tool can help you keep your lungs clear and active. This may help to reverse or lessen your chance of developing breathing (pulmonary) problems, especially infection. You may be asked to use a spirometer: After a surgery. If you have a lung problem or a history of smoking. After a long period of time when you have been unable to move or be active. If the spirometer includes an indicator to show the highest number that you have reached, your health care provider or respiratory therapist will help you set a goal. Keep a log of your progress as told by your health care provider. What are the risks? Breathing too quickly may cause dizziness or cause you to pass out. Take your time so you do not get dizzy or light-headed. If you are in pain, you may need to take pain medicine before doing incentive spirometry. It is harder to take a deep breath if you are having pain. How to use your incentive spirometer  Sit up on the edge of your bed or on a chair. Hold the incentive spirometer so that it is in an upright position. Before you use the spirometer, breathe out normally. Place the mouthpiece in your mouth. Make sure your lips are closed tightly around it. Breathe in slowly and as deeply as you  can through your mouth, causing the piston or the ball to rise toward the top of the chamber. Hold your breath for 3-5 seconds, or for as long as possible. If the spirometer includes a coach indicator, use this to guide you in breathing. Slow down your breathing if the indicator goes above the marked areas. Remove the mouthpiece from your mouth and breathe out normally. The piston or ball will return to the bottom of the chamber. Rest for a few seconds, then repeat the steps 10 or more times. Take your time and take a few normal breaths between deep breaths so that you do not get dizzy or light-headed. Do this every 1-2 hours when you are awake. If the spirometer includes a goal marker to show the highest number you have reached (best effort), use this as a goal to work toward during each repetition. After each set of 10 deep breaths, cough a few times. This will help to make sure that your lungs are clear. If you have an incision on your chest or abdomen from surgery, place a pillow or a rolled-up towel firmly against the incision when you cough. This can help to reduce pain while taking deep breaths and coughing. General tips When you are able to get out of bed: Walk around often. Continue to take deep breaths and cough in order to clear your lungs. Keep using the incentive spirometer until your health care provider says it is okay to stop using it. If you have been in the hospital, you may be told to keep using the spirometer at home. Contact a health care provider if: You are having difficulty using the spirometer. You have trouble using the spirometer as often as instructed. Your pain medicine is not giving enough relief for you to use the spirometer as told. You have a fever. Get help right away if: You develop shortness of breath. You develop a cough with bloody mucus from the lungs. You have fluid or blood coming from an incision site after you cough. Summary An incentive spirometer  is a tool that can help  you learn to take long, deep breaths to keep your lungs clear and active. You may be asked to use a spirometer after a surgery, if you have a lung problem or a history of smoking, or if you have been inactive for a long period of time. Use your incentive spirometer as instructed every 1-2 hours while you are awake. If you have an incision on your chest or abdomen, place a pillow or a rolled-up towel firmly against your incision when you cough. This will help to reduce pain. Get help right away if you have shortness of breath, you cough up bloody mucus, or blood comes from your incision when you cough. This information is not intended to replace advice given to you by your health care provider. Make sure you discuss any questions you have with your health care provider. Document Revised: 06/03/2019 Document Reviewed: 06/03/2019 Elsevier Patient Education  2023 [employer]   POLAR CARE INFORMATION  MassAdvertisement.it  How to use [facility] Therapy System?  YouTube   ShippingScam.co.uk  OPERATING INSTRUCTIONS  Start the product With dry hands, connect the transformer to the electrical connection located on the top of the cooler. Next, plug the transformer into an appropriate electrical outlet. The unit will automatically start running at this point.  To stop the pump, disconnect electrical power.  Unplug to stop the product when not in use. Unplugging the Polar Care unit turns it off. Always unplug immediately after use. Never leave it plugged in while unattended. Remove pad.    FIRST ADD WATER TO FILL LINE, THEN ICE---Replace ice when existing ice is almost melted  1 Discuss Treatment with your Licensed Health Care Practitioner and Use Only as Prescribed 2 Apply Insulation Barrier & Cold Therapy Pad 3 Check for Moisture 4 Inspect Skin Regularly  Tips and Trouble Shooting Usage Tips 1. Use cubed or chunked ice for optimal  performance. 2. It is recommended to drain the Pad between uses. To drain the pad, hold the Pad upright with the hose pointed toward the ground. Depress the black plunger and allow water to drain out. 3. You may disconnect the Pad from the unit without removing the pad from the affected area by depressing the silver tabs on the hose coupling and gently pulling the hoses apart. The Pad and unit will seal itself and will not leak. Note: Some dripping during release is normal. 4. DO NOT RUN PUMP WITHOUT WATER! The pump in this unit is designed to run with water. Running the unit without water will cause permanent damage to the pump. 5. Unplug unit before removing lid.  TROUBLESHOOTING GUIDE Pump not running, Water not flowing to the pad, Pad is not getting cold 1. Make sure the transformer is plugged into the wall outlet. 2. Confirm that the ice and water are filled to the indicated levels. 3. Make sure there are no kinks in the pad. 4. Gently pull on the blue tube to make sure the tube/pad junction is straight. 5. Remove the pad from the treatment site and ll it while the pad is lying at; then reapply. 6. Confirm that the pad couplings are securely attached to the unit. Listen for the double clicks (Figure 1) to confirm the pad couplings are securely attached.  Leaks    Note: Some condensation on the lines, controller, and pads is unavoidable, especially in warmer climates. 1. If using a [employer] Polar Care Cold Therapy unit with a detachable Cold Therapy Pad, and a leak  exists (other than condensation on the lines) disconnect the pad couplings. Make sure the silver tabs on the couplings are depressed before reconnecting the pad to the pump hose; then confirm both sides of the coupling are properly clicked in. 2. If the coupling continues to leak or a leak is detected in the pad itself, stop using it and call [employer] Customer Care at [phone].  Cleaning After use, empty and dry the unit with a soft  cloth. Warm water and mild detergent may be used occasionally to clean the pump and tubes.  WARNING: The Polar Care Cube can be cold enough to cause serious injury, including full skin necrosis. Follow these Operating Instructions, and carefully read the Product Insert (see pouch on side of unit) and the Cold Therapy Pad Fitting Instructions (provided with each Cold Therapy Pad) prior to use.

## 2022-09-20 ENCOUNTER — Encounter: Payer: Self-pay | Admitting: Surgery

## 2022-09-20 NOTE — Progress Notes (Signed)
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 Clinical nursing staff has updated patient's PMH/PSHx, current medication list, and drug allergies/intolerances to ensure comprehensive history available to assist in medical decision making as it pertains to the aforementioned surgical procedure and anticipated anesthetic course. Extensive review of available clinical information personally performed. [facility] PMH and PSHx updated with any diagnoses/procedures that  may have been inadvertently omitted during his intake with the pre-admission testing department's nursing staff.  Clinical Discussion:  Steven Welch is a 82 y.o. male who is submitted for pre-surgical anesthesia review and clearance prior to him undergoing the above procedure. Patient has never been a smoker. Pertinent PMH includes: PAF, SSS, asymptomatic bradycardia, RBBB, aortic atherosclerosis, HTN, HLD, GERD (no daily Tx), recurrent falls, OA, anxiety.  Patient is followed by cardiology Darrold Junker, MD). He was last seen in the cardiology clinic on 09/06/2022; notes reviewed. At the time of his clinic visit, patient doing well overall from a cardiovascular perspective. Patient denied any chest pain, shortness of breath, PND, orthopnea, palpitations, significant peripheral edema, weakness, fatigue, vertiginous symptoms, or presyncope/syncope.  Patient was  experiencing balance issues and recurrent falls.  Patient with a past medical history significant for cardiovascular diagnoses. Documented physical exam was grossly benign, providing no evidence of acute exacerbation and/or decompensation of the patient's known cardiovascular conditions.  Most recent TTE was performed on 01/08/2021 revealing normal left ventricular systolic function with an EF of 60 to 65%.  There were no regional wall motion abnormalities.  There was mild LVH.  Left ventricular diastolic Doppler parameters indeterminate.  Right ventricular size and function normal.  There was mild mitral valve and trivial pulmonary valve regurgitation. All transvalvular gradients were noted to be normal providing no evidence suggestive of valvular stenosis.  Aorta normal in size with no evidence of aneurysmal dilatation.  Patient with an atrial fibrillation diagnosis; CHA2DS2-VASc Score = 4 (age x 2, HTN, vascular disease history). His rate and rhythm are currently being maintained on oral metoprolol tartrate. He is chronically anticoagulated using apixaban; reported to be compliant with therapy with no evidence or reports of GI bleeding.  Of note, patient with asymptomatic bradycardia.  Patient transitioning care to new cardiology team following his previous cardiologist leaving the practice. In review of notes from Dr. Gwen Pounds, patient has been counseled in the past regarding need for potential Micra leadless PPM placement.  From the notes available from 02/2022, it seems as if patient was being scheduled for this procedure, however to date patient has not had pacemaker placed, and most recent note from cardiology does not mention pacemaker placement.  Blood pressure well-controlled at 128/60 mmHg on currently prescribed diuretic (HCTZ), vasodilator (hydralazine), and beta-blocker (metoprolol tartrate) therapies.  Patient is not on any type of lipid-lowering therapies for his HLD diagnosis and ASCVD  prevention.  He is not diabetic.  He does not have an OSAH diagnosis.  Functional capacity limited by age and arthritides.  Patient  reportedly have not been as active as normal due to shoulder pain.  With that being said, patient is felt to be able to achieve at least 4 METS of physical activity without experiencing any degree of significant angina/anginal equivalent symptoms.  No changes were made to his medication regimen.  Patient to follow-up with outpatient cardiology in 6 months or sooner if needed.  [name] is scheduled for an elective REVERSE SHOULDER ARTHROPLASTY with BICEPS TENODESIS (Left: Shoulder) on 09/22/2022 with Dr. Leron Croak, MD.  Given patient's past medical history significant for cardiovascular diagnoses, presurgical cardiac clearance was sought by the PAT team. Per cardiology, "this patient is optimized for surgery and may proceed with the planned procedural course with a MODERATE risk of significant perioperative cardiovascular complications".  Again, this patient is on daily anticoagulation using a DOAC medication. He has been instructed on recommendations for holding his apixaban for 3 days prior to his procedure with plans to restart as soon as postoperative bleeding risk felt to be minimized by his attending surgeon. The patient has been instructed that his last dose of his apixaban should be on 09/18/2022.  Patient denies previous perioperative complications with anesthesia in the past. In review of the available records, it is noted that patient underwent a neuraxial anesthetic course here at [facility] (ASA II) in 12/2018 without documented complications.      09/09/2022    2:53 PM 08/18/2022    1:27 AM 08/18/2022    1:20 AM  Vitals with BMI  Weight 222 lbs 14 oz    Systolic 175  162  Diastolic 87  96  Pulse 50 55 53    Providers/Specialists:   NOTE: Primary physician provider listed below. Patient may have been seen by APP or  partner within same practice.   PROVIDER ROLE / SPECIALTY LAST OV  Poggi, Excell Seltzer, MD Orthopedics (Surgeon) 08/26/2022  Dorothey Baseman, MD Primary Care Provider 08/31/2022  Marcina Millard, MD Cardiology 09/06/2022   Allergies:  Patient has no known allergies.  Current Home Medications:   No current facility-administered medications for this encounter.    acetaminophen (TYLENOL) 500 MG tablet   apixaban (ELIQUIS) 5 MG TABS tablet   diphenhydramine-acetaminophen (TYLENOL PM) 25-500 MG TABS tablet   hydrALAZINE (APRESOLINE) 25 MG tablet   hydrochlorothiazide (HYDRODIURIL) 25 MG tablet   lidocaine (LIDODERM) 5 %   Menthol, Topical Analgesic, (ICY HOT ORIGINAL PAIN RELIEF) 2.5 % GEL   metoprolol tartrate (LOPRESSOR) 25 MG tablet   History:   Past Medical History:  Diagnosis Date   Anxiety    Aortic atherosclerosis (HCC)    Asymptomatic bradycardia    Cyst of breast, right 1981   Gastric ulcer    GERD (gastroesophageal reflux disease)    Hepatitis 1988   History of shingles    Hyperlipidemia    Hypertension    Long term current use of anticoagulant    a.) apixaban   Osteoarthritis    PAF (paroxysmal atrial fibrillation) (HCC)    a.) CHA2DS2VASc = 4 (age x2, HTN, vascular disease history);  b.) rate/rhythm maintained on oral metoprolol tartrate; chronically anticoagulated with apixaban   RBBB (right bundle branch block)    Recurrent falls    Skin cancer of face    SSS (sick sinus syndrome) (HCC)    Past Surgical History:  Procedure Laterality Date   BREAST EXCISIONAL BIOPSY Right 1981   neg   COLON SURGERY     age 50 month old  TONSILLECTOMY     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   TOTAL HIP ARTHROPLASTY Right 01/01/2019   Procedure: TOTAL HIP ARTHROPLASTY ANTERIOR APPROACH;  Surgeon: Kennedy Bucker, MD;  Location: [facility] ORS;  Service: Orthopedics;  Laterality: Right;   Family History  Problem Relation Age of Onset   CAD Mother    Lung cancer Father     Social History   Tobacco Use   Smoking status: Never   Smokeless tobacco: Never  Vaping Use   Vaping Use: Never used  Substance Use Topics   Alcohol use: Not Currently   Drug use: Never    Pertinent Clinical Results:  LABS:  Lab Results  Component Value Date   WBC 9.4 08/17/2022   HGB 16.0 08/17/2022   HCT 49.1 08/17/2022   MCV 96.1 08/17/2022   PLT 221 08/17/2022   Lab Results  Component Value Date   NA 136 08/17/2022   K 3.9 08/17/2022   CO2 25 08/17/2022   GLUCOSE 127 (H) 08/17/2022   BUN 23 08/17/2022   CREATININE 1.12 08/17/2022   CALCIUM 8.6 (L) 08/17/2022   EGFR 71 02/04/2021   GFRNONAA >60 08/17/2022   Component Date Value Ref Range Status   MRSA, PCR 09/09/2022 NEGATIVE  NEGATIVE Final   Staphylococcus aureus 09/09/2022 NEGATIVE  NEGATIVE Final   Comment: (NOTE) The Xpert SA Assay (FDA approved for NASAL specimens in patients 33 years of age and older), is one component of a comprehensive surveillance program. It is not intended to diagnose infection nor to guide or monitor treatment. Performed at [facility], [address] [place], [place] [address]    Color, Urine 09/09/2022 YELLOW (A)  YELLOW Final   APPearance 09/09/2022 CLEAR (A)  CLEAR Final   Specific Gravity, Urine 09/09/2022 1.018  1.005 - 1.030 Final   pH 09/09/2022 5.0  5.0 - 8.0 Final   Glucose, UA 09/09/2022 NEGATIVE  NEGATIVE mg/dL Final   Hgb urine dipstick 09/09/2022 NEGATIVE  NEGATIVE Final   Bilirubin Urine 09/09/2022 NEGATIVE  NEGATIVE Final   Ketones, ur 09/09/2022 NEGATIVE  NEGATIVE mg/dL Final   Protein, ur 78/46/9629 100 (A)  NEGATIVE mg/dL Final   Nitrite 52/84/1324 NEGATIVE  NEGATIVE Final   Leukocytes,Ua 09/09/2022 NEGATIVE  NEGATIVE Final   RBC / HPF 09/09/2022 0-5  0 - 5 RBC/hpf Final   WBC, UA 09/09/2022 0-5  0 - 5 WBC/hpf Final   Bacteria, UA 09/09/2022 NONE SEEN  NONE SEEN Final   Squamous Epithelial / HPF 09/09/2022 NONE SEEN  0 - 5 /HPF Final    Mucus 09/09/2022 PRESENT   Final   Performed at [facility], [address] [place], [place] [address]   ABO/RH(D) 09/09/2022 O POS   Final   Antibody Screen 09/09/2022 NEG   Final   Sample Expiration 09/09/2022 09/23/2022,2359   Final   Extend sample reason 09/09/2022    Final                   Value:NO TRANSFUSIONS OR PREGNANCY IN THE PAST 3 MONTHS Performed at [facility], [address] [place], [place] [address]     ECG: Date: 08/17/2022 Time ECG obtained: 2111 PM Rate: 46 bpm Rhythm: sinus bradycardia; RBBB Axis (leads I and aVF): Normal Intervals: PR 192 ms. QRS 124 ms. QTc 449 ms. ST segment and T wave changes: No evidence of acute ST segment elevation or depression.  Evidence of a possible age undetermined inferior infarct present. Comparison: Similar to  previous tracing obtained on 03/08/2022   IMAGING / PROCEDURES: DIAGNOSTIC RADIOGRAPHS OF CHEST 2 VIEWS performed on 08/18/2022 Mild eventration of the hemidiaphragms bilaterally, unchanged.  Lungs are clear.  No pneumothorax or pleural effusion.  Cardiac size within normal limits.  Pulmonary vascularity is normal.  No acute bone abnormality.  Degenerative changes are noted within the shoulders bilaterally.  CT SHOULDER LEFT WO CONTRAST performed on Advanced glenohumeral osteoarthritis Mild acromioclavicular osteoarthritis Small RIGHT pleural effusion  TRANSTHORACIC ECHOCARDIOGRAM performed on 01/08/2021 Left ventricular ejection fraction, by estimation, is 60 to 65%. The left ventricle has normal function. The left ventricle has no regional wall motion abnormalities. There is mild left ventricular hypertrophy. Left ventricular diastolic parameters are indeterminate.  Right ventricular systolic function is normal. The right ventricular size is normal.  The mitral valve is normal in structure. Mild mitral valve regurgitation. No evidence of mitral stenosis.  Rhythm is atrial fibrillation    Impression and Plan:  Steven Welch has been referred for pre-anesthesia review and clearance prior to him undergoing the planned anesthetic and procedural courses. Available labs, pertinent testing, and imaging results were personally reviewed by me in preparation for upcoming operative/procedural course. [facility] medical record has been updated following extensive record review and patient interview with PAT staff.   This patient has been appropriately cleared by cardiology with an overall MODERATE risk of experiencing significant perioperative cardiovascular complications. Based on clinical review performed today (09/20/22), barring any significant acute changes in the patient's overall condition, it is anticipated that he will be able to proceed with the planned surgical intervention. Any acute changes in clinical condition may necessitate his procedure being postponed and/or cancelled. Patient will meet with anesthesia team (MD and/or CRNA) on the day of his procedure for preoperative evaluation/assessment. Questions regarding anesthetic course will be fielded at that time.   Pre-surgical instructions were reviewed with the patient during his PAT appointment, and questions were fielded to satisfaction by PAT clinical staff. He has been instructed on which medications that he will need to hold prior to surgery, as well as the ones that have been deemed safe/appropriate to take on the day of his procedure. As part of the general education provided by PAT, patient made aware both verbally and in writing, that he would need to abstain from the use of any illegal substances during his perioperative course.  He was advised that failure to follow the provided instructions could necessitate case cancellation or result in serious perioperative complications up to and including death. Patient encouraged to contact PAT and/or his surgeon's office to discuss any questions or concerns that may arise prior to  surgery; verbalized understanding.   Quentin Mulling, MSN, APRN, FNP-C, CEN [facility]  Peri-operative Services Nurse Practitioner Phone: [phone] Fax: [phone] 09/20/22 8:18 AM  NOTE: This note has been prepared using Dragon dictation software. Despite my best ability to proofread, there is always the potential that unintentional transcriptional errors may still occur from this process.

## 2022-09-22 ENCOUNTER — Ambulatory Visit: Payer: Medicare Other | Admitting: Urgent Care

## 2022-09-22 ENCOUNTER — Ambulatory Visit: Payer: Medicare Other

## 2022-09-22 ENCOUNTER — Observation Stay
Admission: RE | Admit: 2022-09-22 | Discharge: 2022-09-26 | Disposition: A | Discharge status: Skilled Nursing Facility | Payer: Medicare Other | Source: Ambulatory Visit | Attending: Surgery | Admitting: Surgery

## 2022-09-22 ENCOUNTER — Other Ambulatory Visit: Payer: Self-pay

## 2022-09-22 ENCOUNTER — Encounter: Payer: Self-pay | Admitting: Surgery

## 2022-09-22 ENCOUNTER — Encounter
Admission: RE | Disposition: A | Discharge status: Skilled Nursing Facility | Payer: Self-pay | Source: Ambulatory Visit | Attending: Surgery

## 2022-09-22 DIAGNOSIS — Z96643 Presence of artificial hip joint, bilateral: Secondary | ICD-10-CM | POA: Insufficient documentation

## 2022-09-22 DIAGNOSIS — Z96612 Presence of left artificial shoulder joint: Secondary | ICD-10-CM

## 2022-09-22 DIAGNOSIS — Z85828 Personal history of other malignant neoplasm of skin: Secondary | ICD-10-CM | POA: Insufficient documentation

## 2022-09-22 DIAGNOSIS — Z7901 Long term (current) use of anticoagulants: Secondary | ICD-10-CM | POA: Diagnosis not present

## 2022-09-22 DIAGNOSIS — I480 Paroxysmal atrial fibrillation: Secondary | ICD-10-CM | POA: Insufficient documentation

## 2022-09-22 DIAGNOSIS — I10 Essential (primary) hypertension: Secondary | ICD-10-CM | POA: Diagnosis not present

## 2022-09-22 DIAGNOSIS — Z79899 Other long term (current) drug therapy: Secondary | ICD-10-CM | POA: Insufficient documentation

## 2022-09-22 DIAGNOSIS — M19012 Primary osteoarthritis, left shoulder: Principal | ICD-10-CM | POA: Insufficient documentation

## 2022-09-22 DIAGNOSIS — M7522 Bicipital tendinitis, left shoulder: Secondary | ICD-10-CM | POA: Diagnosis not present

## 2022-09-22 HISTORY — DX: Paroxysmal atrial fibrillation: I48.0

## 2022-09-22 HISTORY — DX: Anxiety disorder, unspecified: F41.9

## 2022-09-22 HISTORY — DX: Repeated falls: R29.6

## 2022-09-22 HISTORY — DX: Atherosclerosis of aorta: I70.0

## 2022-09-22 HISTORY — DX: Sick sinus syndrome: I49.5

## 2022-09-22 HISTORY — DX: Gastric ulcer, unspecified as acute or chronic, without hemorrhage or perforation: K25.9

## 2022-09-22 HISTORY — PX: REVERSE SHOULDER ARTHROPLASTY: SHX5054

## 2022-09-22 HISTORY — DX: Unspecified right bundle-branch block: I45.10

## 2022-09-22 HISTORY — DX: Bradycardia, unspecified: R00.1

## 2022-09-22 HISTORY — DX: Unspecified osteoarthritis, unspecified site: M19.90

## 2022-09-22 HISTORY — DX: Long term (current) use of anticoagulants: Z79.01

## 2022-09-22 SURGERY — ARTHROPLASTY, SHOULDER, TOTAL, REVERSE
Anesthesia: General | Site: Shoulder | Laterality: Left

## 2022-09-22 MED ORDER — CEFAZOLIN SODIUM-DEXTROSE 2-4 GM/100ML-% IV SOLN
INTRAVENOUS | Status: AC
  Filled 2022-09-22: qty 100

## 2022-09-22 MED ORDER — BUPIVACAINE LIPOSOME 1.3 % IJ SUSP
INTRAMUSCULAR | Status: AC
  Filled 2022-09-22: qty 20

## 2022-09-22 MED ORDER — DOCUSATE SODIUM 100 MG PO CAPS
100.0000 mg | ORAL_CAPSULE | Freq: Two times a day (BID) | ORAL | Status: DC
  Administered 2022-09-22 – 2022-09-26 (×8): 100 mg via ORAL
  Filled 2022-09-22 (×6): qty 1

## 2022-09-22 MED ORDER — FLEET ENEMA 7-19 GM/118ML RE ENEM: 1.0000 | ENEMA | Freq: Once | RECTAL | Status: DC | PRN

## 2022-09-22 MED ORDER — CEFAZOLIN SODIUM-DEXTROSE 2-4 GM/100ML-% IV SOLN
2.0000 g | Freq: Four times a day (QID) | INTRAVENOUS | Status: AC
  Administered 2022-09-22 – 2022-09-23 (×3): 2 g via INTRAVENOUS

## 2022-09-22 MED ORDER — LIDOCAINE HCL (PF) 2 % IJ SOLN
INTRAMUSCULAR | Status: AC
  Filled 2022-09-22: qty 5

## 2022-09-22 MED ORDER — PROPOFOL 10 MG/ML IV BOLUS
INTRAVENOUS | Status: DC | PRN
  Administered 2022-09-22: 100 mg via INTRAVENOUS

## 2022-09-22 MED ORDER — HYDROCODONE-ACETAMINOPHEN 5-325 MG PO TABS
ORAL_TABLET | ORAL | Status: AC
  Filled 2022-09-22: qty 1

## 2022-09-22 MED ORDER — BISACODYL 10 MG RE SUPP
10.0000 mg | Freq: Every day | RECTAL | Status: DC | PRN
  Administered 2022-09-25: 10 mg via RECTAL
  Filled 2022-09-22: qty 1

## 2022-09-22 MED ORDER — ONDANSETRON HCL 4 MG/2ML IJ SOLN
INTRAMUSCULAR | Status: DC | PRN
  Administered 2022-09-22: 4 mg via INTRAVENOUS

## 2022-09-22 MED ORDER — TRANEXAMIC ACID 1000 MG/10ML IV SOLN
INTRAVENOUS | Status: DC | PRN
  Administered 2022-09-22: 1000 mg via TOPICAL

## 2022-09-22 MED ORDER — HYDRALAZINE HCL 25 MG PO TABS
25.0000 mg | ORAL_TABLET | Freq: Four times a day (QID) | ORAL | Status: DC | PRN
  Administered 2022-09-25: 25 mg via ORAL
  Filled 2022-09-22: qty 1

## 2022-09-22 MED ORDER — SUCCINYLCHOLINE CHLORIDE 200 MG/10ML IV SOSY
PREFILLED_SYRINGE | INTRAVENOUS | Status: DC | PRN
  Administered 2022-09-22: 100 mg via INTRAVENOUS

## 2022-09-22 MED ORDER — BUPIVACAINE-EPINEPHRINE (PF) 0.5% -1:200000 IJ SOLN
INTRAMUSCULAR | Status: DC | PRN
  Administered 2022-09-22: 30 mL

## 2022-09-22 MED ORDER — ACETAMINOPHEN 500 MG PO TABS
ORAL_TABLET | ORAL | Status: AC
  Filled 2022-09-22: qty 2

## 2022-09-22 MED ORDER — SODIUM CHLORIDE 0.9 % IR SOLN
Status: DC | PRN
  Administered 2022-09-22: 3000 mL

## 2022-09-22 MED ORDER — LACTATED RINGERS IV SOLN: INTRAVENOUS | Status: DC

## 2022-09-22 MED ORDER — GLYCOPYRROLATE 0.2 MG/ML IJ SOLN
INTRAMUSCULAR | Status: DC | PRN
  Administered 2022-09-22: .2 mg via INTRAVENOUS

## 2022-09-22 MED ORDER — BUPIVACAINE LIPOSOME 1.3 % IJ SUSP
INTRAMUSCULAR | Status: DC | PRN
  Administered 2022-09-22: 20 mL via PERINEURAL

## 2022-09-22 MED ORDER — 0.9 % SODIUM CHLORIDE (POUR BTL) OPTIME
TOPICAL | Status: DC | PRN
  Administered 2022-09-22: 500 mL

## 2022-09-22 MED ORDER — PROMETHAZINE HCL 25 MG/ML IJ SOLN: 6.2500 mg | INTRAMUSCULAR | Status: DC | PRN

## 2022-09-22 MED ORDER — FENTANYL CITRATE PF 50 MCG/ML IJ SOSY
PREFILLED_SYRINGE | INTRAMUSCULAR | Status: AC
  Filled 2022-09-22: qty 1

## 2022-09-22 MED ORDER — LIDOCAINE HCL (PF) 1 % IJ SOLN
INTRAMUSCULAR | Status: AC
  Filled 2022-09-22: qty 5

## 2022-09-22 MED ORDER — ONDANSETRON HCL 4 MG/2ML IJ SOLN: 4.0000 mg | Freq: Four times a day (QID) | INTRAMUSCULAR | Status: DC | PRN

## 2022-09-22 MED ORDER — ACETAMINOPHEN 325 MG PO TABS
325.0000 mg | ORAL_TABLET | Freq: Four times a day (QID) | ORAL | Status: DC | PRN
  Administered 2022-09-23 – 2022-09-26 (×7): 650 mg via ORAL
  Filled 2022-09-22 (×6): qty 2

## 2022-09-22 MED ORDER — BUPIVACAINE HCL (PF) 0.5 % IJ SOLN
INTRAMUSCULAR | Status: AC
  Filled 2022-09-22: qty 10

## 2022-09-22 MED ORDER — APIXABAN 5 MG PO TABS
5.0000 mg | ORAL_TABLET | Freq: Two times a day (BID) | ORAL | Status: DC
  Administered 2022-09-23 – 2022-09-26 (×7): 5 mg via ORAL
  Filled 2022-09-22 (×6): qty 1

## 2022-09-22 MED ORDER — METOCLOPRAMIDE HCL 5 MG/ML IJ SOLN: 5.0000 mg | Freq: Three times a day (TID) | INTRAMUSCULAR | Status: DC | PRN

## 2022-09-22 MED ORDER — HYDROCODONE-ACETAMINOPHEN 5-325 MG PO TABS
1.0000 | ORAL_TABLET | ORAL | Status: DC | PRN
  Administered 2022-09-22: 1 via ORAL

## 2022-09-22 MED ORDER — LIDOCAINE HCL (PF) 1 % IJ SOLN
INTRAMUSCULAR | Status: DC | PRN
  Administered 2022-09-22: 1 mL via SUBCUTANEOUS

## 2022-09-22 MED ORDER — DOCUSATE SODIUM 100 MG PO CAPS
ORAL_CAPSULE | ORAL | Status: AC
  Filled 2022-09-22: qty 1

## 2022-09-22 MED ORDER — DIPHENHYDRAMINE-APAP (SLEEP) 25-500 MG PO TABS: 1.0000 | ORAL_TABLET | Freq: Every evening | ORAL | Status: DC | PRN

## 2022-09-22 MED ORDER — KETOROLAC TROMETHAMINE 15 MG/ML IJ SOLN
INTRAMUSCULAR | Status: AC
  Filled 2022-09-22: qty 1

## 2022-09-22 MED ORDER — BUPIVACAINE HCL (PF) 0.5 % IJ SOLN
INTRAMUSCULAR | Status: DC | PRN
  Administered 2022-09-22: 10 mL via PERINEURAL

## 2022-09-22 MED ORDER — CHLORHEXIDINE GLUCONATE 0.12 % MT SOLN
OROMUCOSAL | Status: AC
  Filled 2022-09-22: qty 15

## 2022-09-22 MED ORDER — MAGNESIUM HYDROXIDE 400 MG/5ML PO SUSP
30.0000 mL | Freq: Every day | ORAL | Status: DC | PRN
  Administered 2022-09-24: 30 mL via ORAL
  Filled 2022-09-22: qty 30

## 2022-09-22 MED ORDER — ACETAMINOPHEN 500 MG PO TABS
ORAL_TABLET | ORAL | Status: AC
  Filled 2022-09-22: qty 1

## 2022-09-22 MED ORDER — CHLORHEXIDINE GLUCONATE 0.12 % MT SOLN
15.0000 mL | Freq: Once | OROMUCOSAL | Status: AC
  Administered 2022-09-22: 15 mL via OROMUCOSAL

## 2022-09-22 MED ORDER — ORAL CARE MOUTH RINSE: 15.0000 mL | Freq: Once | OROMUCOSAL | Status: AC

## 2022-09-22 MED ORDER — PHENYLEPHRINE HCL-NACL 20-0.9 MG/250ML-% IV SOLN
INTRAVENOUS | Status: AC
  Filled 2022-09-22: qty 250

## 2022-09-22 MED ORDER — ROCURONIUM BROMIDE 100 MG/10ML IV SOLN
INTRAVENOUS | Status: DC | PRN
  Administered 2022-09-22: 50 mg via INTRAVENOUS

## 2022-09-22 MED ORDER — FENTANYL CITRATE (PF) 100 MCG/2ML IJ SOLN
INTRAMUSCULAR | Status: AC
  Filled 2022-09-22: qty 2

## 2022-09-22 MED ORDER — DIPHENHYDRAMINE HCL 12.5 MG/5ML PO ELIX: 12.5000 mg | ORAL_SOLUTION | ORAL | Status: DC | PRN

## 2022-09-22 MED ORDER — ACETAMINOPHEN 500 MG PO TABS
1000.0000 mg | ORAL_TABLET | Freq: Once | ORAL | Status: AC
  Administered 2022-09-22: 1000 mg via ORAL

## 2022-09-22 MED ORDER — BUPIVACAINE-EPINEPHRINE (PF) 0.5% -1:200000 IJ SOLN
INTRAMUSCULAR | Status: AC
  Filled 2022-09-22: qty 30

## 2022-09-22 MED ORDER — METOCLOPRAMIDE HCL 10 MG PO TABS: 5.0000 mg | ORAL_TABLET | Freq: Three times a day (TID) | ORAL | Status: DC | PRN

## 2022-09-22 MED ORDER — EPHEDRINE SULFATE (PRESSORS) 50 MG/ML IJ SOLN
INTRAMUSCULAR | Status: DC | PRN
  Administered 2022-09-22 (×3): 5 mg via INTRAVENOUS

## 2022-09-22 MED ORDER — KETOROLAC TROMETHAMINE 15 MG/ML IJ SOLN
15.0000 mg | Freq: Once | INTRAMUSCULAR | Status: AC
  Administered 2022-09-22: 15 mg via INTRAVENOUS

## 2022-09-22 MED ORDER — DEXAMETHASONE SODIUM PHOSPHATE 10 MG/ML IJ SOLN
INTRAMUSCULAR | Status: DC | PRN
  Administered 2022-09-22 (×2): 5 mg via INTRAVENOUS

## 2022-09-22 MED ORDER — METOPROLOL TARTRATE 25 MG PO TABS
25.0000 mg | ORAL_TABLET | Freq: Two times a day (BID) | ORAL | Status: DC
  Administered 2022-09-22 – 2022-09-26 (×8): 25 mg via ORAL
  Filled 2022-09-22 (×6): qty 1

## 2022-09-22 MED ORDER — METOPROLOL TARTRATE 25 MG PO TABS
ORAL_TABLET | ORAL | Status: AC
  Filled 2022-09-22: qty 1

## 2022-09-22 MED ORDER — TRANEXAMIC ACID 1000 MG/10ML IV SOLN
INTRAVENOUS | Status: AC
  Filled 2022-09-22: qty 10

## 2022-09-22 MED ORDER — CEFAZOLIN SODIUM-DEXTROSE 2-4 GM/100ML-% IV SOLN
2.0000 g | INTRAVENOUS | Status: AC
  Administered 2022-09-22: 2 g via INTRAVENOUS

## 2022-09-22 MED ORDER — HYDROCHLOROTHIAZIDE 25 MG PO TABS
25.0000 mg | ORAL_TABLET | Freq: Every day | ORAL | Status: DC
  Administered 2022-09-23 – 2022-09-26 (×4): 25 mg via ORAL
  Filled 2022-09-22 (×3): qty 1

## 2022-09-22 MED ORDER — ONDANSETRON HCL 4 MG PO TABS: 4.0000 mg | ORAL_TABLET | Freq: Four times a day (QID) | ORAL | Status: DC | PRN

## 2022-09-22 MED ORDER — SODIUM CHLORIDE 0.9 % IV SOLN: INTRAVENOUS | Status: DC

## 2022-09-22 MED ORDER — DROPERIDOL 2.5 MG/ML IJ SOLN: 0.6250 mg | Freq: Once | INTRAMUSCULAR | Status: DC | PRN

## 2022-09-22 MED ORDER — FENTANYL CITRATE (PF) 100 MCG/2ML IJ SOLN
INTRAMUSCULAR | Status: DC | PRN
  Administered 2022-09-22 (×2): 50 ug via INTRAVENOUS

## 2022-09-22 MED ORDER — MIDAZOLAM HCL 2 MG/2ML IJ SOLN
INTRAMUSCULAR | Status: AC
  Filled 2022-09-22: qty 2

## 2022-09-22 MED ORDER — ACETAMINOPHEN 500 MG PO TABS
500.0000 mg | ORAL_TABLET | Freq: Four times a day (QID) | ORAL | Status: AC
  Administered 2022-09-22: 500 mg via ORAL

## 2022-09-22 MED ORDER — SUGAMMADEX SODIUM 200 MG/2ML IV SOLN
INTRAVENOUS | Status: DC | PRN
  Administered 2022-09-22: 200 mg via INTRAVENOUS

## 2022-09-22 MED ORDER — DIPHENHYDRAMINE HCL 25 MG PO CAPS
25.0000 mg | ORAL_CAPSULE | Freq: Every evening | ORAL | Status: DC | PRN
  Administered 2022-09-23: 25 mg via ORAL
  Filled 2022-09-22: qty 1

## 2022-09-22 MED ORDER — ACETAMINOPHEN 500 MG PO TABS
500.0000 mg | ORAL_TABLET | Freq: Every evening | ORAL | Status: DC | PRN
  Administered 2022-09-23: 500 mg via ORAL
  Filled 2022-09-22: qty 1

## 2022-09-22 SURGICAL SUPPLY — 70 items
APL PRP STRL LF DISP 70% ISPRP (MISCELLANEOUS) ×1
BASEPLATE 24 10D FULL AUGM (Plate) IMPLANT
BIT DRILL FLUTED 3.0 STRL (BIT) IMPLANT
BLADE SAW SAG 25X90X1.19 (BLADE) ×1 IMPLANT
BSPLAT GLND 10D OBLQ 24 FULL (Plate) ×1 IMPLANT
CALIBRATOR GLENOID VIP 5-D (SYSTAGENIX WOUND MANAGEMENT) IMPLANT
CHLORAPREP W/TINT 26 (MISCELLANEOUS) ×1 IMPLANT
COOLER POLAR GLACIER W/PUMP (MISCELLANEOUS) ×1 IMPLANT
COVER BACK TABLE REUSABLE LG (DRAPES) ×1 IMPLANT
CUP SUT UNIV REVERS 39 NEU (Shoulder) IMPLANT
DRAPE 3/4 80X56 (DRAPES) ×1 IMPLANT
DRAPE INCISE IOBAN 66X45 STRL (DRAPES) ×1 IMPLANT
DRSG OPSITE POSTOP 4X8 (GAUZE/BANDAGES/DRESSINGS) ×1 IMPLANT
DRSG XEROFORM 1X8 (GAUZE/BANDAGES/DRESSINGS) IMPLANT
ELECT CAUTERY BLADE 6.4 (BLADE) ×1 IMPLANT
ELECT REM PT RETURN 9FT ADLT (ELECTROSURGICAL) ×1
ELECTRODE REM PT RTRN 9FT ADLT (ELECTROSURGICAL) ×1 IMPLANT
GAUZE XEROFORM 1X8 LF (GAUZE/BANDAGES/DRESSINGS) ×1 IMPLANT
GLENOSPHERE 39+4 LAT/24 UNI RV (Joint) IMPLANT
GLOVE BIO SURGEON STRL SZ7.5 (GLOVE) ×4 IMPLANT
GLOVE BIO SURGEON STRL SZ8 (GLOVE) ×4 IMPLANT
GLOVE BIOGEL PI IND STRL 8 (GLOVE) ×2 IMPLANT
GLOVE INDICATOR 8.0 STRL GRN (GLOVE) ×1 IMPLANT
GOWN STRL REUS W/ TWL LRG LVL3 (GOWN DISPOSABLE) ×1 IMPLANT
GOWN STRL REUS W/ TWL XL LVL3 (GOWN DISPOSABLE) ×1 IMPLANT
GOWN STRL REUS W/TWL LRG LVL3 (GOWN DISPOSABLE) ×1
GOWN STRL REUS W/TWL XL LVL3 (GOWN DISPOSABLE) ×1
HANDLE YANKAUER SUCT OPEN TIP (MISCELLANEOUS) ×1 IMPLANT
HOOD PEEL AWAY T7 (MISCELLANEOUS) ×3 IMPLANT
INSERT HUM REV 39 +6 (Insert) IMPLANT
IV NS IRRIG 3000ML ARTHROMATIC (IV SOLUTION) ×1 IMPLANT
KIT STABILIZATION SHOULDER (MISCELLANEOUS) ×1 IMPLANT
KIT TURNOVER KIT A (KITS) ×1 IMPLANT
MANIFOLD NEPTUNE II (INSTRUMENTS) ×1 IMPLANT
MASK FACE SPIDER DISP (MASK) ×1 IMPLANT
MAT ABSORB FLUID 56X50 GRAY (MISCELLANEOUS) ×1 IMPLANT
NDL MAYO CATGUT SZ1 (NEEDLE) IMPLANT
NDL SAFETY ECLIP 18X1.5 (MISCELLANEOUS) ×1 IMPLANT
NDL SPNL 20GX3.5 QUINCKE YW (NEEDLE) ×1 IMPLANT
NEEDLE MAYO CATGUT SZ1 (NEEDLE) ×1 IMPLANT
NEEDLE SPNL 20GX3.5 QUINCKE YW (NEEDLE) ×1 IMPLANT
NS IRRIG 500ML POUR BTL (IV SOLUTION) ×1 IMPLANT
PACK ARTHROSCOPY SHOULDER (MISCELLANEOUS) ×1 IMPLANT
PAD ARMBOARD 7.5X6 YLW CONV (MISCELLANEOUS) ×1 IMPLANT
PAD WRAPON POLAR SHDR UNIV (MISCELLANEOUS) ×1 IMPLANT
PIN NITINOL TARGETER 2.8 (PIN) IMPLANT
POST MODULAR MGS BASEPLATE 25 (Post) IMPLANT
PULSAVAC PLUS IRRIG FAN TIP (DISPOSABLE) ×1
REAMER ANGLED HEAD SMALL (DRILL) IMPLANT
SCREW PERI LOCK 5.5X16 (Screw) IMPLANT
SCREW PERI LOCK 5.5X36 (Screw) IMPLANT
SCREW PERI NL 4.5X32 (Screw) IMPLANT
SCREW PERIPHERAL 5.5X20 LOCK (Screw) IMPLANT
SLING ULTRA II M (MISCELLANEOUS) IMPLANT
SPONGE T-LAP 18X18 ~~LOC~~+RFID (SPONGE) ×2 IMPLANT
STAPLER SKIN PROX 35W (STAPLE) ×1 IMPLANT
STEM HUMERAL UNI REVERS SZ9 (Stem) IMPLANT
SUT ETHIBOND 0 MO6 C/R (SUTURE) ×1 IMPLANT
SUT FIBERWIRE #2 38 BLUE 1/2 (SUTURE) ×4
SUT VIC AB 0 CT1 36 (SUTURE) ×1 IMPLANT
SUT VIC AB 2-0 CT1 27 (SUTURE) ×2
SUT VIC AB 2-0 CT1 TAPERPNT 27 (SUTURE) ×2 IMPLANT
SUTURE FIBERWR #2 38 BLUE 1/2 (SUTURE) ×4 IMPLANT
SYR 10ML LL (SYRINGE) ×1 IMPLANT
SYR 30ML LL (SYRINGE) ×1 IMPLANT
SYR TOOMEY 50ML (SYRINGE) ×1 IMPLANT
TIP FAN IRRIG PULSAVAC PLUS (DISPOSABLE) ×1 IMPLANT
TRAP FLUID SMOKE EVACUATOR (MISCELLANEOUS) ×1 IMPLANT
WATER STERILE IRR 500ML POUR (IV SOLUTION) ×1 IMPLANT
WRAPON POLAR PAD SHDR UNIV (MISCELLANEOUS) ×1

## 2022-09-22 NOTE — Anesthesia Preprocedure Evaluation (Addendum)
Anesthesia Evaluation  Patient identified by MRN, date of birth, ID band Patient awake    Reviewed: Allergy & Precautions, H&P , NPO status , Patient's Chart, lab work & pertinent test results, reviewed documented beta blocker date and time   Airway Mallampati: III  TM Distance: >3 FB Neck ROM: full    Dental  (+) Poor Dentition, Missing   Pulmonary neg pulmonary ROS   Pulmonary exam normal        Cardiovascular Exercise Tolerance: Poor hypertension, Pt. on home beta blockers and Pt. on medications + dysrhythmias (pAF, SSS, RBBB)  Rhythm:Regular Rate:Bradycardia - Peripheral Edema ECHO:  1. Left ventricular ejection fraction, by estimation, is 60 to 65%. The  left ventricle has normal function. The left ventricle has no regional  wall motion abnormalities. There is mild left ventricular hypertrophy.  Left ventricular diastolic parameters  are indeterminate.   2. Right ventricular systolic function is normal. The right ventricular  size is normal.   3. The mitral valve is normal in structure. Mild mitral valve  regurgitation. No evidence of mitral stenosis.   4. Rhythm is atrial fibrillation     Neuro/Psych  PSYCHIATRIC DISORDERS Anxiety     negative neurological ROS     GI/Hepatic Neg liver ROS, PUD,GERD  ,,  Endo/Other  negative endocrine ROS    Renal/GU      Musculoskeletal  (+) Arthritis ,    Abdominal  (+) + obese  Peds  Hematology negative hematology ROS (+)   Anesthesia Other Findings Deconditioned with imbalance and frequent falls. Uses cane to mobilize  Past Medical History: No date: Anxiety No date: Aortic atherosclerosis (HCC) No date: Asymptomatic bradycardia 1981: Cyst of breast, right No date: Gastric ulcer No date: GERD (gastroesophageal reflux disease) 1988: Hepatitis No date: History of shingles No date: Hyperlipidemia No date: Hypertension No date: Long term current use of  anticoagulant     Comment:  a.) apixaban No date: Osteoarthritis No date: PAF (paroxysmal atrial fibrillation) (HCC)     Comment:  a.) CHA2DS2VASc = 4 (age x2, HTN, vascular disease               history);  b.) rate/rhythm maintained on oral metoprolol               tartrate; chronically anticoagulated with apixaban No date: RBBB (right bundle branch block) No date: Recurrent falls No date: Skin cancer of face No date: SSS (sick sinus syndrome) (HCC)  Past Surgical History: 1981: BREAST EXCISIONAL BIOPSY; Right     Comment:  neg No date: COLON SURGERY     Comment:  age 54 month old No date: TONSILLECTOMY 02/27/2014: TOTAL HIP ARTHROPLASTY; Left 01/01/2019: TOTAL HIP ARTHROPLASTY; Right     Comment:  Procedure: TOTAL HIP ARTHROPLASTY ANTERIOR APPROACH;                Surgeon: Kennedy Bucker, MD;  Location: [facility] ORS;                Service: Orthopedics;  Laterality: Right;     Reproductive/Obstetrics negative OB ROS                              Anesthesia Physical Anesthesia Plan  ASA: 3  Anesthesia Plan: General ETT   Post-op Pain Management: Regional block* and Tylenol PO (pre-op)*   Induction: Intravenous  PONV Risk Score and Plan: 2 and Ondansetron, Dexamethasone and Midazolam  Airway Management Planned:  Oral ETT  Additional Equipment:   Intra-op Plan:   Post-operative Plan: Extubation in OR  Informed Consent: I have reviewed the patients History and Physical, chart, labs and discussed the procedure including the risks, benefits and alternatives for the proposed anesthesia with the patient or authorized representative who has indicated his/her understanding and acceptance.     Dental Advisory Given  Plan Discussed with: CRNA and Surgeon  Anesthesia Plan Comments:          Anesthesia Quick Evaluation

## 2022-09-22 NOTE — H&P (Signed)
History of Present Illness:  Steven Welch is a 82 y.o. male who presents for follow-up of his left shoulder pain secondary to advanced degenerative joint disease. The patient was last seen by myself for these symptoms about 8 months ago. The patient was advised to undergo a total shoulder arthroplasty for his significant degenerative joint disease, but did not wish to consider this procedure at this time. The patient continues to note significant pain in his shoulder, especially with activities away from his body. He rates his pain at 5/10 on today's visit, but notes that the pain can get as high as a 9 or 10/10. He has been taking tramadol and Tylenol, and applying a lidocaine patch to the shoulder as necessary with limited benefit. He had undergone a repeat steroid injection under ultrasound guidance by Dr. Landry Mellow in August, 2023 which provided little if any relief of his symptoms. He has pain at night, especially if he lies on his left side. He also has pain with activities at or above shoulder level as well as with trying to reach behind his back. He is unable to perform any normal daily activities, including managing personal hygiene. He denies any reinjury to the shoulder, and denies any numbness or paresthesias down his arm to his hand. He is now ready to consider more aggressive treatment options.  Current Outpatient Medications:  acetaminophen (TYLENOL) 500 MG tablet Take 1,000 mg by mouth 2 (two) times daily as needed for Pain  apixaban (ELIQUIS) 5 mg tablet Take 1 tablet (5 mg total) by mouth 2 (two) times daily 60 tablet 2  hydrALAZINE (APRESOLINE) 25 MG tablet Take 1 tablet (25 mg total) by mouth 3 (three) times daily 270 tablet 1  hydroCHLOROthiazide (HYDRODIURIL) 25 MG tablet TAKE 1 TABLET(25 MG) BY MOUTH EVERY DAY 30 tablet 2  lidocaine (LIDODERM) 5 % patch Place onto the skin once as needed  losartan (COZAAR) 100 MG tablet Take 100 mg by mouth once daily  metoprolol tartrate  (LOPRESSOR) 25 MG tablet Take 1 tablet (25 mg total) by mouth 2 (two) times daily 60 tablet 11  traMADoL (ULTRAM) 50 mg tablet Take 1 tablet (50 mg total) by mouth 2 (two) times daily as needed 1 po bid prn 14 tablet 0   Allergies: No Known Allergies  Past Medical History:  Anxiety  History of cyst of breast  History of hepatitis 1988  History of shingles  Hyperlipidemia  Hypertension  Stomach ulcer   Past Surgical History:  Procedure Laterality Date  TONSILLECTOMY 1949  ASPIRATION CYST BREAST Right 1981  Left total hip replacement, 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  Diet: Red meat twice a week. Fast foods 3 times a week. Fried foods 3 times a week.   Social Determinants of Health:   Financial Resource Strain: Patient Declined (02/22/2022)  Overall Financial Resource Strain (CARDIA)  Difficulty of Paying Living Expenses: Patient declined  Food Insecurity: Patient Declined (  02/22/2022)  Hunger Vital Sign  Worried About Running Out of Food in the Last Year: Patient declined  Ran Out of Food in the Last Year: Patient declined  Transportation Needs: Patient Declined (02/22/2022)  PRAPARE - Risk analyst (Medical): Patient declined  Lack of Transportation (Non-Medical): Patient declined   Review of Systems:  A comprehensive 14 point ROS was performed, reviewed, and the pertinent orthopaedic findings  are documented in the HPI.  Physical Exam: Vitals:  08/26/22 1001  BP: 124/84  Weight: 100.2 kg (220 lb 12.8 oz)  Height: 177.8 cm (5\' 10" )  PainSc: 5  PainLoc: Shoulder   General/Constitutional: The patient appears to be well-nourished, well-developed, and in no acute distress. Neuro/Psych: Normal mood and affect, oriented to person, place and time. Eyes: Non-icteric. Pupils are equal, round, and reactive to light, and exhibit synchronous movement. ENT: Unremarkable. Lymphatic: No palpable adenopathy. Respiratory: Lungs clear to auscultation, Normal chest excursion, No wheezes, and Non-labored breathing Cardiovascular: Regular rate and rhythm. No murmurs. and No edema, swelling or tenderness, except as noted in detailed exam. Integumentary: No impressive skin lesions present, except as noted in detailed exam. Musculoskeletal: Unremarkable, except as noted in detailed exam.  Left shoulder exam: SKIN: Normal SWELLING: None WARMTH: None LYMPH NODES: No adenopathy palpable CREPITUS: Moderate crepitance of the glenohumeral joint TENDERNESS: Mild tenderness over anterolateral shoulder ROM (active):  Forward flexion: 30 degrees Abduction: 20 degrees Internal rotation: L4 ROM (passive):  Forward flexion: 70 degrees Abduction: 60 degrees  ER/IR at 90 abd: Not evaluated  He appearances significant pain and crepitance with all motions.  STRENGTH: Forward flexion: 3/5 Abduction: 3/5 External rotation: 4/5 Internal rotation: 4/5 Pain with RC testing: Moderate pain with resisted strength testing in all planes.  STABILITY: Normal  SPECIAL TESTS: Juanetta Gosling' test: Not evaluated Speed's test: Not evaluated Capsulitis - pain w/ passive ER: No Crossed arm test: Not evaluated Crank: Not evaluated Anterior apprehension: Negative Posterior apprehension: Not evaluated  He remains neurovascularly intact to the left upper extremity and hand.  X-rays/MRI/Lab data:  A recent CT scan of  the right shoulder has been obtained and is available for review. By report, this study demonstrates severe degenerative changes of the glenohumeral joint with moderate postero-central wear of the glenoid. The humeral head remains reasonably well centered in the glenoid. The rotator cuff appears to be grossly intact. Both the films and report were reviewed by myself and discussed with the patient and his wife.  Assessment: 1. Primary osteoarthritis of left shoulder.   Plan: The treatment options were discussed with the patient and his wife. In addition, patient educational materials were provided regarding the diagnosis and treatment options. The patient is quite frustrated by his symptoms and function limitations, and is ready to consider more aggressive treatment options. Therefore, I have recommended a surgical procedure, specifically a reverse left total shoulder arthroplasty. The procedure was discussed with the patient, as were the potential risks (including bleeding, infection, nerve and/or blood vessel injury, persistent or recurrent pain, loosening and/or failure of the components, dislocation, need for further surgery, blood clots, strokes, heart attacks and/or arhythmias, pneumonia, etc.) and benefits. The patient states his understanding and wishes to proceed. All of the patient's questions and concerns were answered. He can call any time with further concerns. He will follow up post-surgery, routine.    H&P reviewed and patient re-examined. No changes.

## 2022-09-22 NOTE — Evaluation (Addendum)
Physical Therapy Evaluation Patient Details Name: Steven Welch MRN: [id number] DOB: [birth year] Today's Date: 09/22/2022  History of Present Illness  Pt is an 82 y.o. 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  PMH includes anxiety, h/o shingles, HLD, htn, stomach ulcer, B THR, frequent falls, PAF.  Clinical Impression  Prior to surgery, pt reports being ambulatory (occasional use of SPC in/outside of home); reports living with his wife in 1 level home with 2 STE (no railing).  Pt oriented to person, place, month/year but pt did not know he had surgery today.  Pt reports he has been falling backwards for about the past week but his wife has been preventing him from falling (last actual fall reported was 2 weeks ago).  Currently pt is mod to max assist semi-supine to sitting edge of bed; 1-2 assist to stand from bed (d/t posterior lean); and 2 assist to take steps bed to recliner (shuffling type steps noted and pt requiring assist for balance).  Pt demonstrating posterior lean with standing activities and posterior lean/push when sitting requiring increased assist for safety.  Pt would currently benefit from skilled PT to address noted impairments and functional limitations (see below for any additional details).  Upon hospital discharge, pt would benefit from ongoing therapy.    Recommendations for follow up therapy are one component of a multi-disciplinary discharge planning process, led by the attending physician.  Recommendations may be updated based on patient status, additional functional criteria and insurance authorization.  Follow Up Recommendations Can patient physically be transported by private vehicle: No     Assistance Recommended at Discharge Frequent or constant Supervision/Assistance  Patient can return home with the following  Two people to help with walking and/or transfers;Two  people to help with bathing/dressing/bathroom;Assistance with cooking/housework;Assist for transportation;Help with stairs or ramp for entrance    Equipment Recommendations Other (comment) (TBD at next facility)  Recommendations for Other Services  OT consult    Functional Status Assessment Patient has had a recent decline in their functional status and demonstrates the ability to make significant improvements in function in a reasonable and predictable amount of time.     Precautions / Restrictions Precautions Precautions: Shoulder;Fall Shoulder Interventions: Shoulder sling/immobilizer;Shoulder abduction pillow;At all times Restrictions Weight Bearing Restrictions: Yes LUE Weight Bearing: Non weight bearing Other Position/Activity Restrictions: NWB'ing through L shoulder or UE      Mobility  Bed Mobility Overal bed mobility: Needs Assistance Bed Mobility: Supine to Sit     Supine to sit: Mod assist, Max assist, HOB elevated     General bed mobility comments: assist for LE's and trunk; vc's for technique    Transfers Overall transfer level: Needs assistance Equipment used: 1 person hand held assist Transfers: Sit to/from Stand, Bed to chair/wheelchair/BSC Sit to Stand: Mod assist, Max assist           General transfer comment: assist to initiate stand and maintain balance d/t posterior lean; vc's for LE placement and overall technique to stand; assist to control descent sitting (pt tending to have posterior lean/push when sitting)    Ambulation/Gait Ambulation/Gait assistance: Min assist, Mod assist, +2 physical assistance Gait Distance (Feet): 3 Feet (bed to recliner) Assistive device: 1 person hand held assist   Gait velocity: decreased     General Gait Details: shuffling type steps; assist to steady  Stairs  Wheelchair Mobility    Modified Rankin (Stroke Patients Only)       Balance Overall balance assessment: Needs  assistance Sitting-balance support: No upper extremity supported, Feet supported Sitting balance-Leahy Scale: Fair Sitting balance - Comments: steady static sitting (posterior lean/push initially when sitting) Postural control: Posterior lean Standing balance support: Single extremity supported Standing balance-Leahy Scale: Poor Standing balance comment: posterior lean in standing requiring assist and cueing for balance                             Pertinent Vitals/Pain Pain Assessment Pain Assessment: Faces Faces Pain Scale: No hurt Pain Location: L shoulder Pain Intervention(s): Limited activity within patient's tolerance, Monitored during session Vitals (HR and SpO2 on room air) stable and WFL throughout treatment session.    Home Living Family/patient expects to be discharged to:: Private residence Living Arrangements: Spouse/significant other Available Help at Discharge: Family;Available PRN/intermittently Type of Home: House Home Access: Stairs to enter Entrance Stairs-Rails: None Entrance Stairs-Number of Steps: 2   Home Layout: One level Home Equipment: Cane - single Librarian, academic (2 wheels)      Prior Function Prior Level of Function : Needs assist             Mobility Comments: Occasional use of SPC in/outside of home; last fall about 2 weeks ago (pt reports he has been falling backwards in the last week but his wife has helped him maintain his balance).       Hand Dominance        Extremity/Trunk Assessment   Upper Extremity Assessment Upper Extremity Assessment: LUE deficits/detail LUE Deficits / Details: L UE in shoulder immobilizer LUE: Unable to fully assess due to immobilization    Lower Extremity Assessment Lower Extremity Assessment: Generalized weakness (at least 4/5 B hip flexion, knee flexion/extension, and DF)    Cervical / Trunk Assessment Cervical / Trunk Assessment: Other exceptions Cervical / Trunk Exceptions:  forward head/shoulders  Communication   Communication: HOH  Cognition Arousal/Alertness: Awake/alert Behavior During Therapy: Flat affect Overall Cognitive Status: No family/caregiver present to determine baseline cognitive functioning                                 General Comments: Oriented to person, place, and month/year (reported it was the 30th); pt did not know he had surgery today.  Increased cues required for safety with mobility; increased time to follow cues        General Comments General comments (skin integrity, edema, etc.): L UE in shoulder sling/immobilizer.  Nursing cleared pt for participation in physical therapy.  Pt agreeable to PT session.    Exercises     Assessment/Plan    PT Assessment Patient needs continued PT services  PT Problem List Decreased strength;Decreased range of motion;Decreased activity tolerance;Decreased balance;Decreased mobility;Decreased cognition;Decreased knowledge of use of DME;Decreased safety awareness;Decreased knowledge of precautions;Decreased skin integrity;Pain       PT Treatment Interventions DME instruction;Gait training;Stair training;Functional mobility training;Therapeutic activities;Therapeutic exercise;Balance training;Patient/family education    PT Goals (Current goals can be found in the Care Plan section)  Acute Rehab PT Goals Patient Stated Goal: to improve balance and mobility PT Goal Formulation: With patient Time For Goal Achievement: 10/06/22 Potential to Achieve Goals: Fair    Frequency BID     Co-evaluation  AM-PAC PT "6 Clicks" Mobility  Outcome Measure Help needed turning from your back to your side while in a flat bed without using bedrails?: A Lot Help needed moving from lying on your back to sitting on the side of a flat bed without using bedrails?: A Lot Help needed moving to and from a bed to a chair (including a wheelchair)?: Total Help needed standing up from  a chair using your arms (e.g., wheelchair or bedside chair)?: Total Help needed to walk in hospital room?: Total Help needed climbing 3-5 steps with a railing? : Total 6 Click Score: 8    End of Session Equipment Utilized During Treatment: Gait belt;Other (comment) (L UE sling/immobilizer) Activity Tolerance: Patient tolerated treatment well Patient left: in chair;with call bell/phone within reach;Other (comment) (pt in view of nursing area) Nurse Communication: Mobility status;Precautions;Weight bearing status;Other (comment) (pt did not know he had surgery today) PT Visit Diagnosis: Unsteadiness on feet (R26.81);Other abnormalities of gait and mobility (R26.89);Muscle weakness (generalized) (M62.81);History of falling (Z91.81);Pain Pain - Right/Left: Left Pain - part of body: Shoulder    Time: 1191-4782 PT Time Calculation (min) (ACUTE ONLY): 30 min   Charges:   PT Evaluation $PT Eval Low Complexity: 1 Low PT Treatments $Therapeutic Activity: 8-22 mins       Hendricks Limes, PT 09/22/22, 5:46 PM  Addendum:  MD Poggi updated on pt's status via secure message; nurse updated in person.

## 2022-09-22 NOTE — Op Note (Signed)
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  Post-Op Diagnosis:   Same  Procedure:   Reverse left total shoulder arthroplasty with biceps tenodesis.  Surgeon:   Maryagnes Amos, MD  Assistant:   Horris Latino, PA-C  Anesthesia:   General endotracheal with an interscalene block using Exparel placed preoperatively by the anesthesiologist.  Findings:   As above.  Complications:   None  EBL:   75 cc  Fluids:   600 cc crystalloid  UOP:   None  TT:   None  Drains:   None  Closure:   Staples  Implants:   All press-fit Arthrex system with a #9 Apex humeral stem, a 39 mm SutureCup with a +6 deep dish insert, a 10 degree augmented 24 mm base plate with a 25 mm post, and a 39 mm +4 lateralized glenosphere.  Brief Clinical Note:   The patient is an 82 year old male with a long history of progressively worsening pain and stiffness of his left shoulder. His symptoms have progressed despite medications, activity modification, etc. His history and examination are consistent with advanced degenerative joint disease with significant posterior glenoid wear and rotator cuff tendinopathy. The patient presents at this time for a reverse left total shoulder arthroplasty with biceps tenodesis.  Procedure:   The patient underwent placement of an interscalene block using Exparel by the anesthesiologist in the preoperative holding area before being brought into the operating room and lain in the supine position. The patient then underwent general endotracheal intubation and anesthesia before the patient was repositioned in the beach chair position using the beach chair positioner. The left shoulder and upper extremity were prepped with ChloraPrep solution before being draped sterilely. Preoperative antibiotics were administered. A timeout was performed to verify the appropriate surgical  site.    A standard anterior approach to the shoulder was made through an approximately 4-5 inch incision. The incision was carried down through the subcutaneous tissues to expose the deltopectoral fascia. The interval between the deltoid and pectoralis muscles was identified and this plane developed, retracting the cephalic vein laterally with the deltoid muscle. The conjoined tendon was identified. Its lateral margin was dissected and the Kolbel self-retraining retractor inserted. The "three sisters" were identified and cauterized. Bursal tissues were removed to improve visualization.   The biceps tendon was identified near the inferior aspect of the bicipital groove. A soft tissue tenodesis was performed by attaching the biceps tendon to the adjacent pectoralis major tendon using two #0 Ethibond interrupted sutures. The biceps tendon was then transected just proximal to the tenodesis site. The subscapularis tendon was released from its attachment to the lesser tuberosity 1 cm proximal to its insertion and several tagging sutures placed. The inferior capsule was released with care after identifying and protecting the axillary nerve. The proximal humeral cut was made at approximately 30 of retroversion using the extra-medullary guide.   Attention was redirected to the glenoid. The labrum was debrided circumferentially before the center of the glenoid was marked with electrocautery. The guidewire was drilled into the glenoid neck using the appropriate guide. After verifying its position, it was overreamed with the 10 degree augmented baseplate reamer to create a flat surface before the peripheral reamer was introduced to clean off any peripheral soft tissues. The central 25 mm post reamer was then inserted to complete the glenoid preparation. The permanent mini-baseplate construct with a 25  mm post attachment was impacted into place. The baseplate was then further secured using four peripheral screws. Locking  screws were placed superiorly, anteriorly, and posteriorly while one non-locking screw was placed inferiorly. The permanent 39 mm +4 mm lateralized glenosphere was then impacted into place and its Morse taper locking mechanism verified using manual distraction. Finally, the glenosphere locking screw was inserted and tightened securely.  Attention was directed to the humeral side. The humeral canal was reamed with first the 5 mm and then the 6 mm reamer. The canal was broached beginning with a #5 broach and progressing to a #9 broach. This was left in place and the metaphyseal inset reamer was used to create the metaphyseal socket.  A trial reduction performed using the 39 mm trial humeral platform with first the +3 mm insert and then the +6 mm insert.  With the +6 mm deep dish insert, the arm demonstrated excellent range of motion as the hand could be brought across the chest to the opposite shoulder and brought to the top of the patient's head and to the patient's ear. The shoulder appeared stable throughout this range of motion. The joint was dislocated and the trial components removed. The permanent #9 Apex stem was impacted into place with care taken to maintain the appropriate version. The permanent 39 mm SutureCup humeral platform was then impacted into place before the +6 mm deep dish insert was snapped into place. The shoulder was relocated using two finger pressure and again placed through a range of motion with the findings as described above.  The wound was copiously irrigated with sterile saline solution using the jet lavage system before a total of 30 cc of 0.5% Sensorcaine with epinephrine was injected into the pericapsular and peri-incisional tissues to help with postoperative analgesia. The subscapularis tendon was reapproximated using #2 FiberWire interrupted sutures. The deltopectoral interval was closed using #0 Vicryl interrupted sutures before the subcutaneous tissues were closed using 2-0  Vicryl interrupted sutures. The skin was closed using staples. Prior to closing the skin, 1 g of transexemic acid in 10 cc of normal saline was injected intra-articularly to help with postoperative bleeding. A sterile occlusive dressing was applied to the wound before the arm was placed into a shoulder immobilizer with an abduction pillow. A Polar Care system also was applied to the shoulder. The patient was then transferred back to a hospital bed before being awakened, extubated, and returned to the recovery room in satisfactory condition after tolerating the procedure well.

## 2022-09-22 NOTE — Anesthesia Procedure Notes (Signed)
Anesthesia Regional Block: Interscalene brachial plexus block   Pre-Anesthetic Checklist: , timeout performed,  Correct Patient, Correct Site, Correct Laterality,  Correct Procedure, Correct Position, site marked,  Risks and benefits discussed,  Surgical consent,  Pre-op evaluation,  At surgeon's request and post-op pain management  Laterality: Upper and Left  Prep: chloraprep       Needles:  Injection technique: Single-shot  Needle Type: Stimiplex     Needle Length: 9cm  Needle Gauge: 22     Additional Needles:   Procedures:,,,, ultrasound used (permanent image in chart),,    Narrative:  Start time: 09/22/2022 9:35 AM End time: 09/22/2022 9:40 AM Injection made incrementally with aspirations every 5 mL.  Performed by: Personally  Anesthesiologist: Foye Deer, MD  Additional Notes: Patient consented for risk and benefits of nerve block including but not limited to nerve damage, failed block, bleeding and infection.  Patient voiced understanding.  Functioning IV was confirmed and monitors were applied.  Timeout done prior to procedure and prior to any sedation being given to the patient.  Patient confirmed procedure site prior to any sedation given to the patient. Sterile prep,hand hygiene and sterile gloves were used.  Minimal sedation used for procedure.  No paresthesia endorsed by patient during the procedure.  Negative aspiration and negative test dose prior to incremental administration of local anesthetic. The patient tolerated the procedure well with no immediate complications.

## 2022-09-22 NOTE — Plan of Care (Signed)
  Problem: Pain Managment: Goal: General experience of comfort will improve Outcome: Progressing   Problem: Safety: Goal: Ability to remain free from injury will improve Outcome: Progressing   

## 2022-09-22 NOTE — Anesthesia Procedure Notes (Cosign Needed)
Procedure Name: Intubation Date/Time: 09/22/2022 10:54 AM  Performed by: Foye Deer, MDPre-anesthesia Checklist: Patient identified, Emergency Drugs available, Suction available and Patient being monitored Patient Re-evaluated:Patient Re-evaluated prior to induction Oxygen Delivery Method: Circle system utilized Preoxygenation: Pre-oxygenation with 100% oxygen Induction Type: IV induction Ventilation: Mask ventilation without difficulty Laryngoscope Size: McGraph and 4 Grade View: Grade I Tube type: Oral Tube size: 7.5 mm Number of attempts: 1 Airway Equipment and Method: Stylet and Video-laryngoscopy Placement Confirmation: ETT inserted through vocal cords under direct vision, positive ETCO2 and breath sounds checked- equal and bilateral Secured at: 24 cm Tube secured with: Tape Dental Injury: Teeth and Oropharynx as per pre-operative assessment  Comments: Atraumatic intubation. Zana Biancardi, SRNA plaecd ETT under direct supervision. CRNA and MDA at bedside.

## 2022-09-22 NOTE — Transfer of Care (Cosign Needed)
Immediate Anesthesia Transfer of Care Note  Patient: Steven Welch  Procedure(s) Performed: REVERSE SHOULDER ARTHROPLASTY with BICEPS TENODESIS (Left: Shoulder)  Patient Location: PACU  Anesthesia Type:General and Regional  Level of Consciousness: drowsy and patient cooperative  Airway & Oxygen Therapy: Patient Spontanous Breathing and Patient connected to nasal cannula oxygen  Post-op Assessment: Report given to RN and Post -op Vital signs reviewed and stable  Post vital signs: Reviewed and stable  Last Vitals:  Vitals Value Taken Time  BP 155/109 09/22/22 1324  Temp    Pulse 55 09/22/22 1326  Resp    SpO2 99 % 09/22/22 1326  Vitals shown include unvalidated device data.  Last Pain:  Vitals:   09/22/22 0809  TempSrc: Temporal  PainSc: 0-No pain      Patients Stated Pain Goal: 0 (09/22/22 0809)  Complications: No notable events documented.

## 2022-09-23 ENCOUNTER — Encounter: Payer: Self-pay | Admitting: Surgery

## 2022-09-23 DIAGNOSIS — M19012 Primary osteoarthritis, left shoulder: Secondary | ICD-10-CM | POA: Diagnosis not present

## 2022-09-23 LAB — BASIC METABOLIC PANEL WITH GFR
Anion gap: 10 (ref 5–15)
BUN: 26 mg/dL — ABNORMAL HIGH (ref 8–23)
CO2: 27 mmol/L (ref 22–32)
Calcium: 8.5 mg/dL — ABNORMAL LOW (ref 8.9–10.3)
Chloride: 97 mmol/L — ABNORMAL LOW (ref 98–111)
Creatinine, Ser: 1.17 mg/dL (ref 0.61–1.24)
GFR, Estimated: >60 mL/min
Glucose, Bld: 132 mg/dL — ABNORMAL HIGH (ref 70–99)
Potassium: 4.1 mmol/L (ref 3.5–5.1)
Sodium: 134 mmol/L — ABNORMAL LOW (ref 135–145)

## 2022-09-23 LAB — CBC
HCT: 44.1 % (ref 39.0–52.0)
Hemoglobin: 14.7 g/dL (ref 13.0–17.0)
MCH: 31.5 pg (ref 26.0–34.0)
MCHC: 33.3 g/dL (ref 30.0–36.0)
MCV: 94.4 fL (ref 80.0–100.0)
Platelets: 237 10*3/uL (ref 150–400)
RBC: 4.67 MIL/uL (ref 4.22–5.81)
RDW: 12.1 % (ref 11.5–15.5)
WBC: 17.7 10*3/uL — ABNORMAL HIGH (ref 4.0–10.5)
nRBC: 0 % (ref 0.0–0.2)

## 2022-09-23 MED ORDER — ACETAMINOPHEN 325 MG PO TABS
ORAL_TABLET | ORAL | Status: AC
  Filled 2022-09-23: qty 2

## 2022-09-23 MED ORDER — CEFAZOLIN SODIUM-DEXTROSE 2-4 GM/100ML-% IV SOLN
INTRAVENOUS | Status: AC
  Filled 2022-09-23: qty 100

## 2022-09-23 MED ORDER — DOCUSATE SODIUM 100 MG PO CAPS
ORAL_CAPSULE | ORAL | Status: AC
  Filled 2022-09-23: qty 1

## 2022-09-23 MED ORDER — ORAL CARE MOUTH RINSE: 15.0000 mL | OROMUCOSAL | Status: DC | PRN

## 2022-09-23 MED ORDER — METOPROLOL TARTRATE 25 MG PO TABS
ORAL_TABLET | ORAL | Status: AC
  Filled 2022-09-23: qty 1

## 2022-09-23 MED ORDER — APIXABAN 2.5 MG PO TABS
ORAL_TABLET | ORAL | Status: AC
  Filled 2022-09-23: qty 2

## 2022-09-23 MED ORDER — HYDROCODONE-ACETAMINOPHEN 5-325 MG PO TABS: 0.5000 | ORAL_TABLET | ORAL | 0 refills | Status: DC | PRN

## 2022-09-23 MED ORDER — HYDROCHLOROTHIAZIDE 25 MG PO TABS
ORAL_TABLET | ORAL | Status: AC
  Filled 2022-09-23: qty 1

## 2022-09-23 MED ORDER — BACLOFEN 10 MG PO TABS
5.0000 mg | ORAL_TABLET | Freq: Three times a day (TID) | ORAL | Status: DC | PRN
  Administered 2022-09-24 (×2): 5 mg via ORAL
  Filled 2022-09-23 (×2): qty 1

## 2022-09-23 MED ORDER — ONDANSETRON HCL 4 MG PO TABS: 4.0000 mg | ORAL_TABLET | Freq: Four times a day (QID) | ORAL | 0 refills | Status: DC | PRN

## 2022-09-23 NOTE — Discharge Instructions (Signed)
Diet: As you were doing prior to hospitalization  ° °Shower:  May shower but keep the wounds dry, use an occlusive plastic wrap, NO SOAKING IN TUB.  If the bandage gets wet, change with a clean dry gauze. ° °Dressing:  You may change your dressing as needed. Change the dressing with sterile gauze dressing.   ° °Activity:  Increase activity slowly as tolerated, but follow the weight bearing instructions below.  No lifting or driving for 6 weeks. ° °Weight Bearing:   Non-weightbearing to the left arm. ° °To prevent constipation: you may use a stool softener such as - ° °Colace (over the counter) 100 mg by mouth twice a day  °Drink plenty of fluids (prune juice may be helpful) and high fiber foods °Miralax (over the counter) for constipation as needed.   ° °Itching:  If you experience itching with your medications, try taking only a single pain pill, or even half a pain pill at a time.  You may take up to 10 pain pills per day, and you can also use benadryl over the counter for itching or also to help with sleep.  ° °Precautions:  If you experience chest pain or shortness of breath - call 911 immediately for transfer to the hospital emergency department!! ° °If you develop a fever greater that 101 F, purulent drainage from wound, increased redness or drainage from wound, or calf pain-Call [facility]                                              °Follow- Up Appointment:  Please call for an appointment to be seen in 2 weeks at [facility] °

## 2022-09-23 NOTE — Progress Notes (Signed)
Physical Therapy Treatment Patient Details Name: Steven Welch MRN: [id number] DOB: [birth year] Today's Date: 09/23/2022   History of Present Illness Pt is an 82 y.o. 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  PMH includes anxiety, h/o shingles, HLD, htn, stomach ulcer, B THR, frequent falls, PAF.    PT Comments    Pt was supine in bed with HOB minimally elevated. He is alert but pleasantly confused. Poor insight of current situation. He is however able to consistently follow commands and is willing to perform all desired task requested of him. Pt does require extensive assistance to safely exit bed, stand with +1 HHA, and take steps from EOB to recliner. Pt presents with severe posterior push. Constant assistance throughout to prevent LOB posteriorly. Overall though, pt tolerated session well and is progressing towards PT goals. Chartered loss adjuster discussed post acute POC with spouse via phone. All parties in agreement with current PT recs.    Recommendations for follow up therapy are one component of a multi-disciplinary discharge planning process, led by the attending physician.  Recommendations may be updated based on patient status, additional functional criteria and insurance authorization.     Assistance Recommended at Discharge Frequent or constant Supervision/Assistance  Patient can return home with the following A lot of help with walking and/or transfers;A lot of help with bathing/dressing/bathroom;Assistance with cooking/housework;Direct supervision/assist for medications management;Direct supervision/assist for financial management;Assist for transportation;Help with stairs or ramp for entrance   Equipment Recommendations  Other (comment) (Defer to next level of care)       Precautions / Restrictions Precautions Precautions: Shoulder;Fall Shoulder Interventions: Shoulder  sling/immobilizer;Shoulder abduction pillow;At all times Precaution Booklet Issued: No Restrictions Weight Bearing Restrictions: Yes LUE Weight Bearing: Non weight bearing Other Position/Activity Restrictions: NWB'ing through L shoulder or UE     Mobility  Bed Mobility Overal bed mobility: Needs Assistance Bed Mobility: Supine to Sit  Supine to sit: Mod assist, Max assist, HOB elevated  General bed mobility comments: pt was able to progress LEs toEOB/ floor but required min-mod of 1 + vcs to achieve full upright short sit. sat EOB x 2 minutes prior to standing to urinate in urinal. currently unsafe to ambulate to BR.    Transfers Overall transfer level: Needs assistance Equipment used: 1 person hand held assist Transfers: Sit to/from Stand Sit to Stand: Mod assist  General transfer comment: MOd assist of one to stand form EOB. pt has severe posterior push. is able to correct with vcs however quickly reverts to posterior push. RN in room during transfer EOB > recliner    Ambulation/Gait Ambulation/Gait assistance: Min assist, Mod assist Gait Distance (Feet): 3 Feet Assistive device: 1 person hand held assist Gait Pattern/deviations: Step-to pattern, Leaning posteriorly Gait velocity: decreased  General Gait Details: Pt has severe posterior lean with all standing activity. min-mod of one to safely take steps form EOB to recliner. VCs for sequencing, fwd wt shift, and to orient pt to chair prior to sitting.   Balance Overall balance assessment: Needs assistance Sitting-balance support: No upper extremity supported, Feet supported Sitting balance-Leahy Scale: Fair Sitting balance - Comments: slight posterior lean at times but mostly close SBA   Standing balance support: Single extremity supported, During functional activity Standing balance-Leahy Scale: Poor Standing balance comment: posterior lean in standing requiring assist and cueing for balance        Cognition  Arousal/Alertness: Awake/alert Behavior During Therapy: [facility] for tasks  assessed/performed Overall Cognitive Status: No family/caregiver present to determine baseline cognitive functioning    General Comments: Pt is alert but pleasantly confused. Does follow commands but has poor insight of current situation/ overall awareness of surgery/ deficits.               Pertinent Vitals/Pain Pain Assessment Pain Assessment: No/denies pain Faces Pain Scale: No hurt Pain Location: L shoulder Pain Intervention(s): Limited activity within patient's tolerance, Monitored during session, Premedicated before session, Repositioned, Ice applied     PT Goals (current goals can now be found in the care plan section) Acute Rehab PT Goals Patient Stated Goal: none stated Progress towards PT goals: Progressing toward goals    Frequency    BID      PT Plan Current plan remains appropriate       AM-PAC PT "6 Clicks" Mobility   Outcome Measure  Help needed turning from your back to your side while in a flat bed without using bedrails?: A Lot Help needed moving from lying on your back to sitting on the side of a flat bed without using bedrails?: A Lot Help needed moving to and from a bed to a chair (including a wheelchair)?: A Lot Help needed standing up from a chair using your arms (e.g., wheelchair or bedside chair)?: A Lot Help needed to walk in hospital room?: A Lot Help needed climbing 3-5 steps with a railing? : Total 6 Click Score: 11    End of Session   Activity Tolerance: Patient tolerated treatment well Patient left: in chair;with call bell/phone within reach;Other (comment) Nurse Communication: Mobility status PT Visit Diagnosis: Unsteadiness on feet (R26.81);Other abnormalities of gait and mobility (R26.89);Muscle weakness (generalized) (M62.81);History of falling (Z91.81);Pain Pain - Right/Left: Left Pain - part of body: Shoulder     Time: 4540-9811 PT Time Calculation  (min) (ACUTE ONLY): 24 min  Charges:  $Therapeutic Activity: 23-37 mins                     Jetta Lout PTA 09/23/22, 8:26 AM

## 2022-09-23 NOTE — Evaluation (Signed)
Occupational Therapy Evaluation Patient Details Name: Steven Welch MRN: [id number] DOB: 1940-04-26 Today's Date: 09/23/2022   History of Present Illness Pt is an 82 y.o. 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  PMH includes anxiety, h/o shingles, HLD, htn, stomach ulcer, B THR, frequent falls, PAF.   Clinical Impression   Chart reviewed, pt greeted in chair agreeable to OT evaluation. Pt is alert and oriented to self, place, grossly to situation (I got shoulder sx); not oriented to date. Increased time for processing and ?awareness of current functional status. PTA pt required assist for LB dressing, bathing and IADLs; Pt amb with a cane PRN. Pt provided education re: in polar care mgt, compression stockings mgt, sling/immobilizer mgt,, strategies bathing/dressing/toileting/grooming, positioning and considerations for sleep, and home/routines modifications to maximize falls prevention, safety, and independence. Handout provided. Caregiver/family would benefit from further education to ensure carry over. OT adjusted sling/immobilizer and polar care to improve comfort, optimize positioning, and to maximize skin integrity/safety. Pt will benefit from skilled OT services to address these limitations and improve independence in daily tasks. Pt will benefit from post acute OT to facilitate improved ADL status. OT will follow acutely.    Recommendations for follow up therapy are one component of a multi-disciplinary discharge planning process, led by the attending physician.  Recommendations may be updated based on patient status, additional functional criteria and insurance authorization.   Assistance Recommended at Discharge Frequent or constant Supervision/Assistance  Patient can return home with the following A lot of help with walking and/or transfers;A lot of help with bathing/dressing/bathroom     Functional Status Assessment  Patient has had a recent decline in their functional status and demonstrates the ability to make significant improvements in function in a reasonable and predictable amount of time.  Equipment Recommendations  Other (comment) (per next venue of care)    Recommendations for Other Services       Precautions / Restrictions Precautions Shoulder Interventions: Shoulder sling/immobilizer;Shoulder abduction pillow Restrictions Weight Bearing Restrictions: Yes LUE Weight Bearing: Non weight bearing      Mobility Bed Mobility               General bed mobility comments: NT in recliner pre/post session    Transfers Overall transfer level: Needs assistance Equipment used: 1 person hand held assist Transfers: Sit to/from Stand Sit to Stand: Mod assist           General transfer comment: multiple attempts, heavy posterior pushing backwards. Pt required MOD A to redistribute weight and stand for approx 1 minute      Balance Overall balance assessment: Needs assistance Sitting-balance support: No upper extremity supported, Feet supported Sitting balance-Leahy Scale: Fair   Postural control: Posterior lean Standing balance support: Single extremity supported, During functional activity Standing balance-Leahy Scale: Poor                             ADL either performed or assessed with clinical judgement   ADL Overall ADL's : Needs assistance/impaired                 Upper Body Dressing : Moderate assistance;Sitting   Lower Body Dressing: Maximal assistance   Toilet Transfer: Minimal assistance;Moderate assistance Toilet Transfer Details (indicate cue type and reason): simulated Toileting- Clothing Manipulation and Hygiene: Maximal assistance         General ADL  Comments: step by step vcs throughout for technique with NWBing status     Vision Patient Visual Report: No change from baseline       Perception      Praxis      Pertinent Vitals/Pain Pain Assessment Pain Assessment: No/denies pain     Hand Dominance Right   Extremity/Trunk Assessment Upper Extremity Assessment Upper Extremity Assessment: LUE deficits/detail LUE Deficits / Details: shoulder flexion/extension NT; pt able to wiggle fingers, flex/extend wrist, unable to perform AROM elbow; LUE Sensation: decreased light touch LUE Coordination: decreased fine motor;decreased gross motor   Lower Extremity Assessment Lower Extremity Assessment: Generalized weakness;Defer to PT evaluation       Communication Communication Communication: No difficulties   Cognition Arousal/Alertness: Awake/alert Behavior During Therapy: WFL for tasks assessed/performed Overall Cognitive Status: No family/caregiver present to determine baseline cognitive functioning Area of Impairment: Attention, Memory, Following commands, Orientation, Awareness                 Orientation Level: Disoriented to, Time Current Attention Level: Sustained Memory: Decreased short-term memory Following Commands: Follows one step commands with increased time   Awareness: Emergent         General Comments  BP 132/88, HR 58 bpm spo2 98% on RA prior to mobility, 140/90 (MAP 106) HR in the 60s after standing; pt reports some dizziness while standing    Exercises     Shoulder Instructions      Home Living Family/patient expects to be discharged to:: Private residence Living Arrangements: Spouse/significant other Available Help at Discharge: Family;Available PRN/intermittently Type of Home: House Home Access: Stairs to enter [employer] of Steps: 2 Entrance Stairs-Rails: None Home Layout: One level     Bathroom Shower/Tub: Chief Strategy Officer: Standard     Home Equipment: Cane - single Librarian, academic (2 wheels)          Prior Functioning/Environment Prior Level of Function : Needs assist              Mobility Comments: PRN use of SPC per pt report, ?fall history ADLs Comments: wife assists with ADL/IADL as needed; pt requires assist for LB dressing, bathing at baseline; assist for IADLs from wife        OT Problem List: Decreased strength;Decreased range of motion;Decreased activity tolerance;Decreased knowledge of use of DME or AE;Decreased coordination;Decreased safety awareness;Decreased knowledge of precautions      OT Treatment/Interventions: Self-care/ADL training;DME and/or AE instruction;Therapeutic activities;Therapeutic exercise;Patient/family education    OT Goals(Current goals can be found in the care plan section) Acute Rehab OT Goals Patient Stated Goal: get stronger OT Goal Formulation: With patient Time For Goal Achievement: 10/07/22 Potential to Achieve Goals: Good ADL Goals Pt Will Perform Grooming: with set-up;with supervision;sitting Pt Will Perform Lower Body Dressing: with min assist;sitting/lateral leans Pt Will Transfer to Toilet: ambulating;with supervision Pt Will Perform Toileting - Clothing Manipulation and hygiene: with supervision;sitting/lateral leans Pt/caregiver will Perform Home Exercise Program: Left upper extremity (per MD protocol)  OT Frequency: Min 3X/week    Co-evaluation              AM-PAC OT "6 Clicks" Daily Activity     Outcome Measure Help from another person eating meals?: None Help from another person taking care of personal grooming?: A Little Help from another person toileting, which includes using toliet, bedpan, or urinal?: A Lot Help from another person bathing (including washing, rinsing, drying)?: A Lot Help from another person to put on and taking  off regular upper body clothing?: A Lot Help from another person to put on and taking off regular lower body clothing?: A Lot 6 Click Score: 15   End of Session    Activity Tolerance: Patient tolerated treatment well Patient left: in chair;with call bell/phone within  reach  OT Visit Diagnosis: Other abnormalities of gait and mobility (R26.89);Muscle weakness (generalized) (M62.81)                Time: 2956-2130 OT Time Calculation (min): 24 min Charges:  OT General Charges $OT Visit: 1 Visit OT Evaluation $OT Eval Moderate Complexity: 1 Mod  Oleta Mouse, OTD OTR/L  09/23/22, 12:47 PM

## 2022-09-23 NOTE — [place] FL2 (Signed)
[place] MEDICAID FL2 LEVEL OF CARE FORM     IDENTIFICATION  Patient Name: Steven Welch Birthdate: 02-26-1941 Sex: male Admission Date (Current Location): 09/22/2022  [facility] and [place] Number:  Chiropodist and Address:  [facility], [address], [place], [place] [address]      Provider Number: [id number]  Attending Physician Name and Address:  Christena Flake, MD  Relative Name and Phone Number:  [place], Moynihan (Spouse) [phone] [facility])    Current Level of Care: Hospital Recommended Level of Care: Skilled Nursing Facility Prior Approval Number:    Date Approved/Denied:   PASRR Number: [id number]  Discharge Plan:      Current Diagnoses: Patient Active Problem List   Diagnosis Date Noted   Status post reverse arthroplasty of shoulder, 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   Elevated troponin 04/04/2021   Hypertension    Recurrent falls    Unsteady gait    Sepsis (HCC) 01/07/2021   Atrial fibrillation with rapid ventricular response (HCC) 01/07/2021   Status post total hip replacement, 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    Orientation RESPIRATION BLADDER Height & Weight     Self, Time, Place, Situation  Normal Incontinent Weight: 222 lb 14.2 oz (101.1 kg) Height:  5\' 10"  (177.8 cm)  BEHAVIORAL SYMPTOMS/MOOD NEUROLOGICAL BOWEL NUTRITION STATUS        Diet (heart)  AMBULATORY STATUS COMMUNICATION OF NEEDS Skin   Extensive Assist Verbally Surgical wounds (honeycomb - L shoulder)                       Personal Care Assistance Level of Assistance  Bathing, Feeding, Dressing Bathing Assistance: Maximum assistance Feeding assistance: Maximum assistance Dressing Assistance: Maximum assistance     Functional Limitations Info             SPECIAL CARE FACTORS FREQUENCY  PT (By licensed PT), OT (By licensed OT)     PT Frequency: 5 times per week OT Frequency: 5 times per week            Contractures      Additional Factors Info  Code Status,  Allergies Code Status Info: full Allergies Info: nka           Current Medications (09/23/2022):  This is the current hospital active medication list Current Facility-Administered Medications  Medication Dose Route Frequency Provider Last Rate Last Admin   0.9 %  sodium chloride infusion   Intravenous Continuous Poggi, Excell Seltzer, MD   Stopped at 09/23/22 0801   acetaminophen (TYLENOL) tablet 325-650 mg  325-650 mg Oral Q6H PRN Poggi, Excell Seltzer, MD       acetaminophen (TYLENOL) tablet 500 mg  500 mg Oral Q6H Poggi, Excell Seltzer, MD   500 mg at 09/22/22 1823   diphenhydrAMINE (BENADRYL) capsule 25 mg  25 mg Oral QHS PRN Orson Aloe, [facility]       And   acetaminophen (TYLENOL) tablet 500 mg  500 mg Oral QHS PRN Orson Aloe, RPH       apixaban Everlene Balls) tablet 5 mg  5 mg Oral BID Poggi, Excell Seltzer, MD       bisacodyl (DULCOLAX) suppository 10 mg  10 mg Rectal Daily PRN Poggi, Excell Seltzer, MD       diphenhydrAMINE (BENADRYL) 12.5 MG/5ML elixir 12.5-25 mg  12.5-25 mg Oral Q4H PRN Poggi, Excell Seltzer, MD       docusate sodium (COLACE) capsule 100 mg  100 mg Oral BID Poggi, Excell Seltzer, MD  100 mg at 09/22/22 2049   hydrALAZINE (APRESOLINE) tablet 25 mg  25 mg Oral Q6H PRN Poggi, Excell Seltzer, MD       hydrochlorothiazide (HYDRODIURIL) tablet 25 mg  25 mg Oral Daily Poggi, Excell Seltzer, MD       HYDROcodone-acetaminophen (NORCO/VICODIN) 5-325 MG per tablet 1 tablet  1 tablet Oral Q4H PRN Poggi, Excell Seltzer, MD   1 tablet at 09/22/22 2050   magnesium hydroxide (MILK OF MAGNESIA) suspension 30 mL  30 mL Oral Daily PRN Poggi, Excell Seltzer, MD       metoCLOPramide (REGLAN) tablet 5-10 mg  5-10 mg Oral Q8H PRN Poggi, Excell Seltzer, MD       Or   metoCLOPramide (REGLAN) injection 5-10 mg  5-10 mg Intravenous Q8H PRN Poggi, Excell Seltzer, MD       metoprolol tartrate (LOPRESSOR) tablet 25 mg  25 mg Oral BID Poggi, Excell Seltzer, MD   25 mg at 09/22/22 2049   ondansetron (ZOFRAN) tablet 4 mg  4 mg Oral Q6H PRN Poggi, Excell Seltzer, MD       Or   ondansetron (ZOFRAN)  injection 4 mg  4 mg Intravenous Q6H PRN Poggi, Excell Seltzer, MD       sodium phosphate (FLEET) 7-19 GM/118ML enema 1 enema  1 enema Rectal Once PRN Poggi, Excell Seltzer, MD         Discharge Medications: Please see discharge summary for a list of discharge medications.  Relevant Imaging Results:  Relevant Lab Results:   Additional Information SS #: [id number]  Pepe Mineau E Zahara Rembert, LCSW

## 2022-09-23 NOTE — Anesthesia Postprocedure Evaluation (Signed)
Anesthesia Post Note  Patient: Steven Welch  Procedure(s) Performed: REVERSE SHOULDER ARTHROPLASTY with BICEPS TENODESIS (Left: Shoulder)  Patient location during evaluation: PACU Anesthesia Type: General Level of consciousness: awake and alert Pain management: pain level controlled Vital Signs Assessment: post-procedure vital signs reviewed and stable Respiratory status: spontaneous breathing, nonlabored ventilation and respiratory function stable Cardiovascular status: blood pressure returned to baseline and stable Postop Assessment: no apparent nausea or vomiting Anesthetic complications: no   No notable events documented.   Last Vitals:  Vitals:   09/22/22 2336 09/23/22 0336  BP: (!) 143/77 139/71  Pulse: 66 65  Resp: 16 18  Temp: 36.5 C (!) 36.3 C  SpO2: 95% 95%    Last Pain:  Vitals:   09/22/22 2135  TempSrc:   PainSc: 3                  Foye Deer

## 2022-09-23 NOTE — Progress Notes (Signed)
Subjective: 1 Day Post-Op Procedure(s) (LRB): REVERSE SHOULDER ARTHROPLASTY with BICEPS TENODESIS (Left) Patient reports pain as mild.   Patient is  well, nursing staff does report some confusion overnight but oriented this morning. PT and care management to assist with discharge planning. Negative for chest pain and shortness of breath Fever: no Gastrointestinal:Negative for nausea and vomiting  Objective: Vital signs in last 24 hours: Temp:  [96.9 F (36.1 C)-98.5 F (36.9 C)] 97.4 F (36.3 C) (06/28 0336) Pulse Rate:  [43-76] 65 (06/28 0336) Resp:  [15-23] 18 (06/28 0336) BP: (114-168)/(66-109) 139/71 (06/28 0336) SpO2:  [91 %-100 %] 95 % (06/28 0336) Weight:  [101.1 kg] 101.1 kg (06/27 0809)  Intake/Output from previous day:  Intake/Output Summary (Last 24 hours) at 09/23/2022 0721 Last data filed at 09/22/2022 1754 Gross per 24 hour  Intake 866.26 ml  Output 75 ml  Net 791.26 ml    Intake/Output this shift: No intake/output data recorded.  Labs: Recent Labs    09/23/22 0550  HGB 14.7   Recent Labs    09/23/22 0550  WBC 17.7*  RBC 4.67  HCT 44.1  PLT 237   Recent Labs    09/23/22 0550  NA 134*  K 4.1  CL 97*  CO2 27  BUN 26*  CREATININE 1.17  GLUCOSE 132*  CALCIUM 8.5*   No results for input(s): "LABPT", "INR" in the last 72 hours.   EXAM General - Patient is Alert, Appropriate, and Oriented Extremity - ABD soft Sling applied to the left shoulder. Patient with decreased sensation to the left arm from recent interscalene block Able to flex and extend fingers and wrist without pain. Reports intact sensation to touch over the hand. Dressing/Incision - clean, dry, no drainage noted to the left shoulder honeycomb dressing. Motor Function - intact, moving foot and toes well on exam.  Abdomen with some distention but intact bowel sounds and no pain with palpation.  Past Medical History:  Diagnosis Date   Anxiety    Aortic atherosclerosis (HCC)     Asymptomatic bradycardia    Cyst of breast, right 1981   Gastric ulcer    GERD (gastroesophageal reflux disease)    Hepatitis 1988   History of shingles    Hyperlipidemia    Hypertension    Long term current use of anticoagulant    a.) apixaban   Osteoarthritis    PAF (paroxysmal atrial fibrillation) (HCC)    a.) CHA2DS2VASc = 4 (age x2, HTN, vascular disease history);  b.) rate/rhythm maintained on oral metoprolol tartrate; chronically anticoagulated with apixaban   RBBB (right bundle branch block)    Recurrent falls    Skin cancer of face    SSS (sick sinus syndrome) (HCC)     Assessment/Plan: 1 Day Post-Op Procedure(s) (LRB): REVERSE SHOULDER ARTHROPLASTY with BICEPS TENODESIS (Left) Principal Problem:   Status post reverse arthroplasty of shoulder, left  Estimated body mass index is 31.98 kg/m as calculated from the following:   Height as of this encounter: 5\' 10"  (1.778 m).   Weight as of this encounter: 101.1 kg. Advance diet Up with therapy D/C IV fluids when tolerating po intake.  Labs reviewed, WBC 17.7, likely reactive to surgery.  No recent fevers.  Recent UA negative for UTI. Reports he is passing gas this morning.  Urinating well. Up with therapy today.  Patient may need SNF depending on performance with PT. Possible d/c today depending on progression with PT.  DVT Prophylaxis - TED hose and Eliquis Non-weightbearing  to the left arm.  Valeria Batman, PA-C [facility] Orthopaedic Surgery 09/23/2022, 7:21 AM

## 2022-09-23 NOTE — Progress Notes (Signed)
PT Cancellation Note  Patient Details Name: Steven Welch MRN: [id number] DOB: 1940/06/03   Cancelled Treatment:     PT attempt. Pt recently transferred to floor. Supportive spouse and daughter at bedside but requesting to hold PT at this time. Pt is lethargic and struggling to stay awake. Author will return tomorrow morning to progress OOB activity while improving safety with ADLs.    [name] 09/23/2022, 4:44 PM

## 2022-09-23 NOTE — TOC Initial Note (Addendum)
Transition of Care [facility]) - Initial/Assessment Note    Patient Details  Name: Steven Welch MRN: [id number] Date of Birth: 03/26/1941  Transition of Care [facility]) CM/SW Contact:    Liliana Cline, LCSW Phone Number: 09/23/2022, 8:47 AM  Clinical Narrative:                 CSW spoke to patient's wife and daughter by phone regarding PT recommending SNF.  They are agreeable to SNF. Patient went to [facility] in the past. They prefer [facility] but understand it is based on avialability. They are going to review the other local SNFs to see what other preferences they have.  SNF work up started, Immunologist at [facility] to review referral.  8:56- Call from daughter who states their second choice in [place] is [facility].  She asked for information about SNFs in [place] as well, provided list - she is going to see if she wants referral sent there.   11:00- [facility] unable to accept due to bed availability. Daughter updated. Requested Tiffany at [facility] review.   3:30- Call from daughter requesting update. Called Tiffany again to ask her to review referral.   4:30- Reached out to [facility] again.  Expected Discharge Plan: Skilled Nursing Facility Barriers to Discharge: Continued Medical Work up   Patient Goals and CMS Choice Patient states their goals for this hospitalization and ongoing recovery are:: SNF per family CMS Medicare.gov Compare Post Acute Care list provided to:: Patient Represenative (must comment) Choice offered to / list presented to : Adult Children, Spouse      Expected Discharge Plan and Services       Living arrangements for the past 2 months: Single Family Home                                      Prior Living Arrangements/Services Living arrangements for the past 2 months: Single Family Home Lives with:: Spouse Patient language and need for interpreter reviewed:: Yes Do you feel safe going back to the place where you live?: Yes       Need for Family Participation in Patient Care: Yes (Comment) Care giver support system in place?: Yes (comment)   Criminal Activity/Legal Involvement Pertinent to Current Situation/Hospitalization: No - Comment as needed  Activities of Daily Living Home Assistive Devices/Equipment: Eyeglasses, Wheelchair, Medical laboratory scientific officer (specify quad or straight) ADL Screening (condition at time of admission) Patient's cognitive ability adequate to safely complete daily activities?: Yes Is the patient deaf or have difficulty hearing?: Yes Does the patient have difficulty seeing, even when wearing glasses/contacts?: Yes Does the patient have difficulty concentrating, remembering, or making decisions?: No Patient able to express need for assistance with ADLs?: Yes Does the patient have difficulty dressing or bathing?: No Independently performs ADLs?: Yes (appropriate for developmental age) Does the patient have difficulty walking or climbing stairs?: Yes (cane) Weakness of Legs: Both Weakness of Arms/Hands: Both  Permission Sought/Granted Permission sought to share information with : Facility Industrial/product designer granted to share information with : Yes, Verbal Permission Granted (by spouse and daughter)     Permission granted to share info w AGENCY: SNFs        Emotional Assessment         Alcohol / Substance Use: Not Applicable Psych Involvement: No (comment)  Admission diagnosis:  Status post reverse arthroplasty of shoulder, left [Z96.612] Patient Active Problem List  Diagnosis Date Noted   Status post reverse arthroplasty of shoulder, 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   Elevated troponin 04/04/2021   Hypertension    Recurrent falls    Unsteady gait    Sepsis (HCC) 01/07/2021   Atrial fibrillation with rapid ventricular response (HCC) 01/07/2021   Status post total hip replacement, 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   PCP:  Dorothey Baseman, MD Pharmacy:   [id number] [place], [place] - [address] AT [facility] [address] [place] [address] Phone: [phone] Fax: [phone]     Social Determinants of Health (SDOH) Social History: SDOH Screenings   Food Insecurity: No Food Insecurity (09/22/2022)  Housing: Low Risk  (09/22/2022)  Transportation Needs: No Transportation Needs (09/22/2022)  Utilities: Not At Risk (09/22/2022)  Tobacco Use: Low Risk  (09/22/2022)   SDOH Interventions:     Readmission Risk Interventions     No data to display

## 2022-09-23 NOTE — Discharge Summary (Signed)
Physician Discharge Summary  Patient ID: Steven Welch MRN: [id number] DOB/AGE: 1940/05/14 82 y.o.  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    Past Medical History:  Diagnosis Date   Anxiety    Aortic atherosclerosis (HCC)    Asymptomatic bradycardia    Cyst of breast, right 1981   Gastric ulcer    GERD (gastroesophageal reflux disease)    Hepatitis 1988   History of shingles    Hyperlipidemia    Hypertension    Long term current use of anticoagulant    a.) apixaban   Osteoarthritis    PAF (paroxysmal atrial fibrillation) (HCC)    a.) CHA2DS2VASc = 4 (age x2, HTN, vascular disease history);  b.) rate/rhythm maintained on oral metoprolol tartrate; chronically anticoagulated with apixaban   RBBB (right bundle branch block)    Recurrent falls    Skin cancer of face    SSS (sick sinus syndrome) (HCC)      Transfusion: None.   Consultants (if any):   Discharged Condition: Improved  Hospital Course: Steven Welch is an 82 y.o. male who was admitted 09/22/2022 with a diagnosis of  advanced degenerative joint disease with rotator cuff and biceps tendinopathy of the left shoulder and went to the operating room on 09/22/2022 and underwent the above named procedures.    Surgeries: Procedure(s): REVERSE SHOULDER ARTHROPLASTY with BICEPS TENODESIS on 09/22/2022 Patient tolerated the surgery well. Taken to PACU where he was  stabilized and then transferred to the orthopedic floor.  Continued on Eliquis 5mg  twice daily. Heels elevated on bed with rolled towels. No evidence of DVT. Negative Homan. Physical therapy started on day #1 for gait training and transfer. OT started day #1 for ADL and assisted devices.  Patient's IV was removed on POD4  Implants: All press-fit Arthrex system with a #9 Apex humeral stem, a 39 mm SutureCup with a +6 deep dish insert, a 10 degree augmented 24 mm base plate with a 25 mm post, and a 39 mm +4 lateralized glenosphere.   He was given perioperative antibiotics:  Anti-infectives (From admission, onward)    Start     Dose/Rate Route Frequency Ordered Stop   09/22/22 1700  ceFAZolin (ANCEF) IVPB 2g/100 mL premix        2 g 200 mL/hr over 30 Minutes Intravenous Every 6 hours 09/22/22 1542 09/23/22 0609   09/22/22 0800  ceFAZolin (ANCEF) IVPB 2g/100 mL premix        2 g 200 mL/hr over 30 Minutes Intravenous On call to O.R. 09/22/22 7829 09/22/22 1107     .  He was given sequential compression devices, early ambulation, and Eliquis for DVT prophylaxis.  He benefited maximally from the hospital stay and there were no complications.    Recent vital signs:  Vitals:   09/25/22 2136 09/26/22 0005  BP: (!) 153/89 (!) 147/87  Pulse: 66 (!) 55  Resp: 16 19  Temp: 99.3 F (37.4 C) 98.8 F (37.1 C)  SpO2: 94%  94%    Recent laboratory studies:  Lab Results  Component Value Date   HGB 14.7 09/24/2022   HGB 14.7 09/23/2022   HGB 16.0 08/17/2022   Lab Results  Component Value Date   WBC 13.1 (H) 09/24/2022   PLT 231 09/24/2022   Lab Results  Component Value Date   INR 1.1 04/03/2021   Lab Results  Component Value Date   NA 134 (L) 09/23/2022   K 4.1 09/23/2022   CL 97 (L) 09/23/2022   CO2 27 09/23/2022   BUN 26 (H) 09/23/2022   CREATININE 1.17 09/23/2022   GLUCOSE 132 (H) 09/23/2022    Discharge Medications:   Allergies as of 09/26/2022   No Known Allergies       Medication List     STOP taking these medications    diphenhydramine-acetaminophen 25-500 MG Tabs tablet Commonly known as: TYLENOL PM       TAKE these medications    acetaminophen 500 MG tablet Commonly known as: TYLENOL Take 2 tablets (1,000 mg total) by mouth every 6 (six) hours as needed (pain). What changed:  how much to take when to take this reasons to take this   apixaban 5 MG Tabs tablet Commonly known as: ELIQUIS Take 1 tablet (5 mg total) by mouth 2 (two) times daily.   hydrALAZINE 25 MG tablet Commonly known as: APRESOLINE Take 1 tablet (25 mg total) by mouth every 6 (six) hours as needed (BP > 160/100).   hydrochlorothiazide 25 MG tablet Commonly known as: HYDRODIURIL Take 25 mg by mouth daily.   Icy Hot Original Pain Relief 2.5 % Gel Generic drug: Menthol (Topical Analgesic) Apply topically as needed.   lidocaine 5 % Commonly known as: Lidoderm Place 1 patch onto the skin every 12 (twelve) hours. Remove & Discard patch within 12 hours or as directed by MD   metoprolol tartrate 25 MG tablet Commonly known as: LOPRESSOR Take 25 mg by mouth 2 (two) times daily.   ondansetron 4 MG tablet Commonly known as: ZOFRAN Take 1 tablet (4 mg total) by mouth every 6 (six) hours as needed for nausea.        Diagnostic Studies: DG Shoulder Left Port  Result Date: 09/22/2022 CLINICAL DATA:  Status post reverse arthroplasty of left shoulder. EXAM: LEFT SHOULDER COMPARISON:  None. FINDINGS: Reverse left shoulder arthroplasty in expected alignment. No periprosthetic lucency or fracture. Recent postsurgical change includes air and edema in the soft tissues with skin staples in place. IMPRESSION: Reverse left shoulder arthroplasty without immediate postoperative complication. Electronically Signed   By: Narda Rutherford M.D.   On: 09/22/2022 16:09   [place] OR NERVE BLOCK-IMAGE ONLY [facility])  Result Date: 09/22/2022 There is no interpretation for this exam.  This  order is for images obtained during a surgical procedure.  Please See "Surgeries" Tab for more information regarding the procedure.    Disposition: Plan is for discharge to [facility] today.   Contact information for follow-up providers     Anson Oregon, PA-C Follow up in 14 day(s).   Specialty: Physician Assistant Why: Mindi Slicker information: [address] [place] [address] [phone]              Contact information for after-discharge care     Destination     [facility] Preferred SNF .   Service: Skilled Paramedic information: [address] [place] [address] [phone]  Signed: Meriel Pica PA-C 09/26/2022, 7:41 AM

## 2022-09-24 DIAGNOSIS — M19012 Primary osteoarthritis, left shoulder: Secondary | ICD-10-CM | POA: Diagnosis not present

## 2022-09-24 LAB — CBC
HCT: 44.4 % (ref 39.0–52.0)
Hemoglobin: 14.7 g/dL (ref 13.0–17.0)
MCH: 31.5 pg (ref 26.0–34.0)
MCHC: 33.1 g/dL (ref 30.0–36.0)
MCV: 95.1 fL (ref 80.0–100.0)
Platelets: 231 10*3/uL (ref 150–400)
RBC: 4.67 MIL/uL (ref 4.22–5.81)
RDW: 12.2 % (ref 11.5–15.5)
WBC: 13.1 10*3/uL — ABNORMAL HIGH (ref 4.0–10.5)
nRBC: 0 % (ref 0.0–0.2)

## 2022-09-24 NOTE — Plan of Care (Signed)
  Problem: Education: Goal: Knowledge of General Education information will improve Description: Including pain rating scale, medication(s)/side effects and non-pharmacologic comfort measures Outcome: Progressing   Problem: Education: Goal: Knowledge of the prescribed therapeutic regimen will improve Outcome: Progressing Goal: Understanding of activity limitations/precautions following surgery will improve Outcome: Progressing Goal: Individualized Educational Video(s) Outcome: Progressing   Problem: Activity: Goal: Ability to tolerate increased activity will improve Outcome: Progressing   Problem: Pain Management: Goal: Pain level will decrease with appropriate interventions Outcome: Progressing   Problem: Education: Goal: Knowledge of General Education information will improve Description: Including pain rating scale, medication(s)/side effects and non-pharmacologic comfort measures Outcome: Progressing   Problem: Health Behavior/Discharge Planning: Goal: Ability to manage health-related needs will improve Outcome: Progressing   Problem: Clinical Measurements: Goal: Ability to maintain clinical measurements within normal limits will improve Outcome: Progressing Goal: Will remain free from infection Outcome: Progressing Goal: Diagnostic test results will improve Outcome: Progressing Goal: Respiratory complications will improve Outcome: Progressing Goal: Cardiovascular complication will be avoided Outcome: Progressing   Problem: Activity: Goal: Risk for activity intolerance will decrease Outcome: Progressing   Problem: Nutrition: Goal: Adequate nutrition will be maintained Outcome: Progressing   Problem: Coping: Goal: Level of anxiety will decrease Outcome: Progressing   Problem: Elimination: Goal: Will not experience complications related to bowel motility Outcome: Progressing Goal: Will not experience complications related to urinary retention Outcome:  Progressing   Problem: Pain Managment: Goal: General experience of comfort will improve Outcome: Progressing   Problem: Safety: Goal: Ability to remain free from injury will improve Outcome: Progressing   Problem: Skin Integrity: Goal: Risk for impaired skin integrity will decrease Outcome: Progressing

## 2022-09-24 NOTE — Progress Notes (Signed)
Subjective: 2 Days Post-Op Procedure(s) (LRB): REVERSE SHOULDER ARTHROPLASTY with BICEPS TENODESIS (Left) Patient reports pain as mild.   Patient is  well, nursing staff does report some confusion overnight but oriented this morning. PT and care management to assist with discharge planning. Negative for chest pain and shortness of breath Fever: no Gastrointestinal:Negative for nausea and vomiting  Objective: Vital signs in last 24 hours: Temp:  [97.9 F (36.6 C)-98.6 F (37 C)] 98 F (36.7 C) (06/29 0008) Pulse Rate:  [56-70] 70 (06/29 0008) Resp:  [16-20] 20 (06/29 0008) BP: (122-139)/(56-81) 133/80 (06/29 0008) SpO2:  [94 %-99 %] 97 % (06/29 0008)  Intake/Output from previous day:  Intake/Output Summary (Last 24 hours) at 09/24/2022 8295 Last data filed at 09/24/2022 0500 Gross per 24 hour  Intake 120 ml  Output 1005 ml  Net -885 ml    Intake/Output this shift: No intake/output data recorded.  Labs: Recent Labs    09/23/22 0550 09/24/22 0521  HGB 14.7 14.7   Recent Labs    09/23/22 0550 09/24/22 0521  WBC 17.7* 13.1*  RBC 4.67 4.67  HCT 44.1 44.4  PLT 237 231   Recent Labs    09/23/22 0550  NA 134*  K 4.1  CL 97*  CO2 27  BUN 26*  CREATININE 1.17  GLUCOSE 132*  CALCIUM 8.5*   No results for input(s): "LABPT", "INR" in the last 72 hours.   EXAM General - Patient is Alert, Appropriate, and Oriented Extremity - ABD soft Sling applied to the left shoulder. Patient with improved sensation to the left arm from recent interscalene block Able to flex and extend fingers and wrist without pain. Reports intact sensation to touch over the hand. Dressing/Incision - clean, dry, no drainage noted to the left shoulder honeycomb dressing. Motor Function - intact, moving foot and toes well on exam.  Abdomen with some distention but intact bowel sounds and no pain with palpation.  Past Medical History:  Diagnosis Date   Anxiety    Aortic atherosclerosis  (HCC)    Asymptomatic bradycardia    Cyst of breast, right 1981   Gastric ulcer    GERD (gastroesophageal reflux disease)    Hepatitis 1988   History of shingles    Hyperlipidemia    Hypertension    Long term current use of anticoagulant    a.) apixaban   Osteoarthritis    PAF (paroxysmal atrial fibrillation) (HCC)    a.) CHA2DS2VASc = 4 (age x2, HTN, vascular disease history);  b.) rate/rhythm maintained on oral metoprolol tartrate; chronically anticoagulated with apixaban   RBBB (right bundle branch block)    Recurrent falls    Skin cancer of face    SSS (sick sinus syndrome) (HCC)     Assessment/Plan: 2 Days Post-Op Procedure(s) (LRB): REVERSE SHOULDER ARTHROPLASTY with BICEPS TENODESIS (Left) Principal Problem:   Status post reverse arthroplasty of shoulder, left  Estimated body mass index is 31.98 kg/m as calculated from the following:   Height as of this encounter: 5\' 10"  (1.778 m).   Weight as of this encounter: 101.1 kg. Advance diet Up with therapy D/C IV fluids when tolerating po intake.  Labs reviewed, WBC 13.1, likely reactive to surgery.  Improving.  No recent fevers.  Recent UA negative for UTI. Reports he is passing gas this morning.  Urinating well. Up with therapy today.  Patient may need SNF depending on performance with PT. Possible d/c to rehab, depending on progression with PT. awaiting bed offer.  DVT  Prophylaxis - TED hose and Eliquis Non-weightbearing to the left arm.  Dedra Skeens, PA-C [facility] Orthopaedic Surgery 09/24/2022, 7:12 AM

## 2022-09-24 NOTE — Progress Notes (Signed)
Occupational Therapy Treatment Patient Details Name: Steven Welch MRN: [id number] DOB: 1940/08/19 Today's Date: 09/24/2022   History of present illness Pt is an 82 y.o. 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  PMH includes anxiety, h/o shingles, HLD, htn, stomach ulcer, B THR, frequent falls, PAF.   OT comments  Chart reviewed to date, nurse cleared pt for participation in OT tx sessoin.Tx session targeted family education to faciliatte carry over of LUE care and safe ADL completion. Wife Ki and her daughter Kenney Houseman present. They report pt has been requiring assist for all ADLs PTA, was ambulatory with a fall history and his performing mobility below PLOF. Pt and family provided education re: polar care mgt, compression stockings mgt, sling/immobilizer mgt, ROM exercises for LUE, LUE precautions, adaptive strategies for bathing/dressing/toileting/grooming, positioning and considerations for sleep, and home/routines modifications to maximize falls prevention, safety, and independence. Handout provided. OT adjusted sling/immobilizer and polar care to improve comfort, optimize positioning, and to maximize skin integrity/safety. All verbalized understanding of all education/training provided. Pt will benefit from skilled OT services to address these limitations and improve independence in daily tasks. OT will continue to follow acutley.    Recommendations for follow up therapy are one component of a multi-disciplinary discharge planning process, led by the attending physician.  Recommendations may be updated based on patient status, additional functional criteria and insurance authorization.    Assistance Recommended at Discharge Frequent or constant Supervision/Assistance  Patient can return home with the following  A lot of help with walking and/or transfers;A lot of help with  bathing/dressing/bathroom   Equipment Recommendations  Other (comment) (per next venue of care)    Recommendations for Other Services      Precautions / Restrictions Precautions Precautions: Shoulder;Fall Shoulder Interventions: Shoulder sling/immobilizer;Shoulder abduction pillow Precaution Booklet Issued: No Restrictions Weight Bearing Restrictions: Yes LUE Weight Bearing: Non weight bearing Other Position/Activity Restrictions: NWB'ing through L shoulder or UE       Mobility Bed Mobility Overal bed mobility: Needs Assistance Bed Mobility: Supine to Sit, Sit to Supine     Supine to sit: Mod assist, Max assist, HOB elevated Sit to supine: Max assist   General bed mobility comments: step by step vcs required    Transfers Overall transfer level: Needs assistance Equipment used: 1 person hand held assist Transfers: Sit to/from Stand Sit to Stand: Mod assist                 Balance Overall balance assessment: Needs assistance Sitting-balance support: No upper extremity supported, Feet supported Sitting balance-Leahy Scale: Poor Sitting balance - Comments: posterior lean with at least MIN A required at all times to maintain with step by step vcs Postural control: Posterior lean Standing balance support: Single extremity supported, During functional activity Standing balance-Leahy Scale: Poor Standing balance comment: heavy posterior lean                           ADL either performed or assessed with clinical judgement   ADL Overall ADL's : Needs assistance/impaired Eating/Feeding: Supervision/ safety;Sitting               Upper Body Dressing : Maximal assistance;Bed level Upper Body Dressing Details (indicate cue type and reason): sling in bed- family provided education re: donning/doffing, sling care Lower Body Dressing: Maximal assistance       Toileting- Clothing Manipulation and Hygiene:  Maximal assistance       Functional  mobility during ADLs: Moderate assistance (four lateral steps up the bed to the L with MOD A, heavy posterior lean) General ADL Comments: step by step vcs throughout for technique with NWBing status    Extremity/Trunk Assessment              Vision       Perception     Praxis      Cognition Arousal/Alertness: Awake/alert Behavior During Therapy: WFL for tasks assessed/performed Overall Cognitive Status: Impaired/Different from baseline Area of Impairment: Attention, Memory, Following commands, Orientation, Awareness (pt with baseline cognitive deficits per family report however appears altered from baseline)                 Orientation Level: Disoriented to, Time, Situation Current Attention Level: Sustained Memory: Decreased short-term memory, Decreased recall of precautions Following Commands: Follows one step commands with increased time Safety/Judgement: Decreased awareness of safety, Decreased awareness of deficits Awareness: Emergent            Exercises Other Exercises Other Exercises: edu re: elbow, wrist, hand HEP; elbow flexion/extension 5x, forearm pronation/supination, wrist flexion/extension 5x, AROM or AAROM    Shoulder Instructions       General Comments vss throughout    Pertinent Vitals/ Pain       Pain Assessment Pain Assessment: No/denies pain Pain Location: L shoulder  Home Living                                          Prior Functioning/Environment              Frequency  Min 3X/week        Progress Toward Goals  OT Goals(current goals can now be found in the care plan section)  Progress towards OT goals: Progressing toward goals     Plan Discharge plan remains appropriate    Co-evaluation                 AM-PAC OT "6 Clicks" Daily Activity     Outcome Measure   Help from another person eating meals?: None Help from another person taking care of personal grooming?: A Little Help  from another person toileting, which includes using toliet, bedpan, or urinal?: A Lot Help from another person bathing (including washing, rinsing, drying)?: A Lot Help from another person to put on and taking off regular upper body clothing?: A Lot Help from another person to put on and taking off regular lower body clothing?: A Lot 6 Click Score: 15    End of Session Equipment Utilized During Treatment: Gait belt;Other (comment) (sling)  OT Visit Diagnosis: Other abnormalities of gait and mobility (R26.89);Muscle weakness (generalized) (M62.81)   Activity Tolerance Patient tolerated treatment well   Patient Left with call bell/phone within reach;in bed;with bed alarm set;with family/visitor present   Nurse Communication Mobility status        Time: 1005-1031 OT Time Calculation (min): 26 min  Charges: OT General Charges $OT Visit: 1 Visit OT Treatments $Self Care/Home Management : 8-22 mins $Therapeutic Exercise: 8-22 mins  Oleta Mouse, OTD OTR/L  09/24/22, 11:05 AM

## 2022-09-24 NOTE — TOC Progression Note (Signed)
Transition of Care [facility]) - Progression Note    Patient Details  Name: Steven Welch MRN: [id number] Date of Birth: April 27, 1940  [facility] CM/SW Contact  Liliana Cline, LCSW Phone Number: 09/24/2022, 10:00 AM  Clinical Narrative:    [facility] made a bed offer. Updated patient's daughter. Plan for [facility] Monday. Will need to start insurance auth tomorrow in preparation.   Expected Discharge Plan: Skilled Nursing Facility Barriers to Discharge: Continued Medical Work up  Expected Discharge Plan and Services       Living arrangements for the past 2 months: Single Family Home                                       Social Determinants of Health (SDOH) Interventions SDOH Screenings   Food Insecurity: No Food Insecurity (09/22/2022)  Housing: Low Risk  (09/22/2022)  Transportation Needs: No Transportation Needs (09/22/2022)  Utilities: Not At Risk (09/22/2022)  Tobacco Use: Low Risk  (09/23/2022)    Readmission Risk Interventions     No data to display

## 2022-09-24 NOTE — Progress Notes (Signed)
Physical Therapy Treatment Patient Details Name: Steven Welch MRN: [id number] DOB: 11-21-1940 Today's Date: 09/24/2022   History of Present Illness Pt is an 82 y.o. 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  PMH includes anxiety, h/o shingles, HLD, htn, stomach ulcer, B THR, frequent falls, PAF.    PT Comments    Author returned for pt's BID/PM session. He remains alert but disoriented. Only oriented to self but pleasantly confused. Follows simple commands throughout. He requires extensive assistance to exit L side of bed, stand, and ambulate to doorway of room and return. Pt has severe balance deficits and remains a high fall risk. Poor overall gait quality with short shuffling narrow step pattern. Limited by fatigue. He will benefit form continued skilled pT at DC to maximize independence and safety with all ADLs. Spouse was present throughout session and states that pt was not very active/ mobile prior to admission. " He spent most of the day in his electric chair." (Referring to lift chair)   Recommendations for follow up therapy are one component of a multi-disciplinary discharge planning process, led by the attending physician.  Recommendations may be updated based on patient status, additional functional criteria and insurance authorization.     Assistance Recommended at Discharge Frequent or constant Supervision/Assistance  Patient can return home with the following A lot of help with walking and/or transfers;A lot of help with bathing/dressing/bathroom;Assistance with cooking/housework;Direct supervision/assist for medications management;Direct supervision/assist for financial management;Assist for transportation;Help with stairs or ramp for entrance   Equipment Recommendations  Other (comment) (Defer to next level of care)       Precautions / Restrictions Precautions Precautions:  Shoulder;Fall Shoulder Interventions: Shoulder sling/immobilizer;Shoulder abduction pillow Precaution Booklet Issued: No Restrictions Weight Bearing Restrictions: Yes LUE Weight Bearing: Non weight bearing Other Position/Activity Restrictions: NWB'ing through L shoulder or UE     Mobility  Bed Mobility Overal bed mobility: Needs Assistance Bed Mobility: Supine to Sit  Supine to sit: Mod assist, Max assist Sit to supine: Max assist General bed mobility comments: pt required increased time and vcs but still requires extensive assistance to safely achieve EOB short sit. Pt has overall less posterior push today however still noticable    Transfers Overall transfer level: Needs assistance Equipment used: 1 person hand held assist Transfers: Sit to/from Stand Sit to Stand: Min assist, Mod assist, From elevated surface     Ambulation/Gait Ambulation/Gait assistance: Min assist, Mod assist Gait Distance (Feet): 15 Feet Assistive device: 1 person hand held assist Gait Pattern/deviations: Step-to pattern, Narrow base of support Gait velocity: decreased  General Gait Details: Pt was able to ambulate to doorway of rooma nd back to bed. poor gait quality with short shuffling steps. Pts does endorse fatigue and wanting to return to bed. per spouse, pt was not very mobile/activeprior to admission   Balance Overall balance assessment: Needs assistance Sitting-balance support: No upper extremity supported, Feet supported Sitting balance-Leahy Scale: Fair Sitting balance - Comments: still has occasional posterior LOB   Standing balance support: Single extremity supported, During functional activity Standing balance-Leahy Scale: Poor Standing balance comment: pt is high fall risk. Needs constant assistance during standing activity to prevent falling      Cognition Arousal/Alertness: Awake/alert Behavior During Therapy: Flat affect Overall Cognitive Status: History of cognitive impairments -  at baseline Area of Impairment: Attention, Memory, Following commands, Orientation, Awareness    Orientation Level: Disoriented to, Place, Time,  Situation    General Comments: Pt continues to be A but disoriented and pleasantly confused. per spouse, at baseline cognitively.           General Comments General comments (skin integrity, edema, etc.): vss throughout      Pertinent Vitals/Pain Pain Assessment Pain Assessment: No/denies pain Faces Pain Scale: No hurt Pain Location: L shoulder Pain Intervention(s): Limited activity within patient's tolerance, Monitored during session, Premedicated before session, Repositioned     PT Goals (current goals can now be found in the care plan section) Acute Rehab PT Goals Patient Stated Goal: none stated Progress towards PT goals: Progressing toward goals    Frequency    BID      PT Plan Current plan remains appropriate       AM-PAC PT "6 Clicks" Mobility   Outcome Measure  Help needed turning from your back to your side while in a flat bed without using bedrails?: A Lot Help needed moving from lying on your back to sitting on the side of a flat bed without using bedrails?: A Lot Help needed moving to and from a bed to a chair (including a wheelchair)?: A Lot Help needed standing up from a chair using your arms (e.g., wheelchair or bedside chair)?: A Lot Help needed to walk in hospital room?: A Lot Help needed climbing 3-5 steps with a railing? : A Lot 6 Click Score: 12    End of Session Equipment Utilized During Treatment: Gait belt Activity Tolerance: Patient tolerated treatment well;Patient limited by fatigue Patient left: in bed;with call bell/phone within reach;with bed alarm set;with family/visitor present Nurse Communication: Mobility status PT Visit Diagnosis: Unsteadiness on feet (R26.81);Other abnormalities of gait and mobility (R26.89);Muscle weakness (generalized) (M62.81);History of falling (Z91.81);Pain Pain -  Right/Left: Left Pain - part of body: Shoulder     Time: 1350-1403 PT Time Calculation (min) (ACUTE ONLY): 13 min  Charges:  $Gait Training: 8-22 mins                    Jetta Lout PTA 09/24/22, 2:13 PM

## 2022-09-24 NOTE — Progress Notes (Signed)
Physical Therapy Treatment Patient Details Name: Steven Welch MRN: [id number] DOB: May 23, 1940 Today's Date: 09/24/2022   History of Present Illness Pt is an 82 y.o. 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  PMH includes anxiety, h/o shingles, HLD, htn, stomach ulcer, B THR, frequent falls, PAF.    PT Comments    Pt was long sitting in bed, wake but disoriented. Pleasantly confused throughout but does consistently follow commands. Poor insight of situation. Reviewed NWB of LUE and that he had recent TSA. Pt was able to exit R side of bed with extensive assistance. Less posterior push today but still has poor overall balance. He stood and ambulated short distance with HHA +1. Distance limited by fatigue and pt safety. Becomes more unsteady after ~ 5 ft. He was repositioned in recliner at conclusion of session with breakfast set up for him to be able to independently feed himself. Pt is progressing however will continue to benefit form skilled PT at DC to maximize independence and safety with all ADLs.    Recommendations for follow up therapy are one component of a multi-disciplinary discharge planning process, led by the attending physician.  Recommendations may be updated based on patient status, additional functional criteria and insurance authorization.     Assistance Recommended at Discharge Frequent or constant Supervision/Assistance  Patient can return home with the following A lot of help with walking and/or transfers;A lot of help with bathing/dressing/bathroom;Assistance with cooking/housework;Direct supervision/assist for medications management;Direct supervision/assist for financial management;Assist for transportation;Help with stairs or ramp for entrance   Equipment Recommendations  Other (comment) (Defer to next level of care)       Precautions / Restrictions  Precautions Precautions: Shoulder;Fall Shoulder Interventions: Shoulder sling/immobilizer;Shoulder abduction pillow Precaution Booklet Issued: No Restrictions Weight Bearing Restrictions: Yes LUE Weight Bearing: Non weight bearing Other Position/Activity Restrictions: NWB'ing through L shoulder or UE     Mobility  Bed Mobility Overal bed mobility: Needs Assistance Bed Mobility: Supine to Sit  Supine to sit: Mod assist, Max assist  General bed mobility comments: pt required increased time and vcs but still requires extensive assistance to safely achieve EOB short sit. Pt has overall less posterior push today however still noticable    Transfers Overall transfer level: Needs assistance Equipment used: 1 person hand held assist Transfers: Sit to/from Stand Sit to Stand: Mod assist   Ambulation/Gait Ambulation/Gait assistance: Min assist, Mod assist Gait Distance (Feet): 10 Feet Assistive device: 1 person hand held assist Gait Pattern/deviations: Step-to pattern, Narrow base of support Gait velocity: decreased  General Gait Details: Pt was able to advance to ambulation ~10 ft in room with HHA +1. Distance limited by fatigue.   Balance Overall balance assessment: Needs assistance Sitting-balance support: No upper extremity supported, Feet supported Sitting balance-Leahy Scale: Fair     Standing balance support: Single extremity supported, During functional activity Standing balance-Leahy Scale: Poor Standing balance comment: pt remains high fall risk       Cognition Arousal/Alertness: Awake/alert Behavior During Therapy: WFL for tasks assessed/performed Overall Cognitive Status: History of cognitive impairments - at baseline Area of Impairment: Attention, Memory, Following commands, Orientation, Awareness    Orientation Level: Disoriented to, Time, Situation, Place    General Comments: Pt was alert but overall disoriented. Pleasantly confused           General  Comments General comments (skin integrity, edema, etc.): vss throughout  Pertinent Vitals/Pain Pain Assessment Pain Assessment: No/denies pain Pain Location: L shoulder Pain Intervention(s): Limited activity within patient's tolerance, Monitored during session, Premedicated before session, Repositioned     PT Goals (current goals can now be found in the care plan section) Acute Rehab PT Goals Patient Stated Goal: none stated    Frequency    BID      PT Plan Current plan remains appropriate       AM-PAC PT "6 Clicks" Mobility   Outcome Measure  Help needed turning from your back to your side while in a flat bed without using bedrails?: A Lot Help needed moving from lying on your back to sitting on the side of a flat bed without using bedrails?: A Lot Help needed moving to and from a bed to a chair (including a wheelchair)?: A Lot Help needed standing up from a chair using your arms (e.g., wheelchair or bedside chair)?: A Lot Help needed to walk in hospital room?: A Lot Help needed climbing 3-5 steps with a railing? : Total 6 Click Score: 11    End of Session Equipment Utilized During Treatment: Gait belt (LUE shoulder sling/abduction pillow) Activity Tolerance: Patient tolerated treatment well;Patient limited by fatigue Patient left: in chair;with call bell/phone within reach;Other (comment) Nurse Communication: Mobility status PT Visit Diagnosis: Unsteadiness on feet (R26.81);Other abnormalities of gait and mobility (R26.89);Muscle weakness (generalized) (M62.81);History of falling (Z91.81);Pain Pain - Right/Left: Left Pain - part of body: Shoulder     Time:  -     Charges:                        Jetta Lout PTA 09/24/22, 1:07 PM

## 2022-09-25 DIAGNOSIS — M19012 Primary osteoarthritis, left shoulder: Secondary | ICD-10-CM | POA: Diagnosis not present

## 2022-09-25 NOTE — TOC Progression Note (Signed)
Transition of Care [facility]) - Progression Note    Patient Details  Name: Steven Welch MRN: [id number] Date of Birth: Jun 30, 1940  [facility] CM/SW Contact  Liliana Cline, LCSW Phone Number: 09/25/2022, 11:26 AM  Clinical Narrative:    Attempted to start auth in [facility] portal - unable to pull patient up. Called [place] and started auth by phone. Faxed clinicals.  Provided weekday RNCM number to call tomorrow with auth updates.    Expected Discharge Plan: Skilled Nursing Facility Barriers to Discharge: Continued Medical Work up  Expected Discharge Plan and Services       Living arrangements for the past 2 months: Single Family Home                                       Social Determinants of Health (SDOH) Interventions SDOH Screenings   Food Insecurity: No Food Insecurity (09/22/2022)  Housing: Low Risk  (09/22/2022)  Transportation Needs: No Transportation Needs (09/22/2022)  Utilities: Not At Risk (09/22/2022)  Tobacco Use: Low Risk  (09/23/2022)    Readmission Risk Interventions     No data to display

## 2022-09-25 NOTE — Progress Notes (Signed)
Subjective: 3 Days Post-Op Procedure(s) (LRB): REVERSE SHOULDER ARTHROPLASTY with BICEPS TENODESIS (Left) Patient reports pain as mild.  Did not sleep well last night. Patient is  well, nursing staff does report some confusion overnight but oriented this morning. PT and care management to assist with discharge planning. Negative for chest pain and shortness of breath Fever: no Gastrointestinal:Negative for nausea and vomiting  Objective: Vital signs in last 24 hours: Temp:  [98.3 F (36.8 C)-99.2 F (37.3 C)] 98.8 F (37.1 C) (06/30 0132) Pulse Rate:  [60-67] 62 (06/30 0132) Resp:  [14-20] 16 (06/30 0132) BP: (142-170)/(82-102) 155/82 (06/30 0132) SpO2:  [93 %-96 %] 93 % (06/30 0132)  Intake/Output from previous day:  Intake/Output Summary (Last 24 hours) at 09/25/2022 0647 Last data filed at 09/25/2022 0200 Gross per 24 hour  Intake 360 ml  Output 1250 ml  Net -890 ml    Intake/Output this shift: Total I/O In: 240 [P.O.:240] Out: 650 [Urine:650]  Labs: Recent Labs    09/23/22 0550 09/24/22 0521  HGB 14.7 14.7   Recent Labs    09/23/22 0550 09/24/22 0521  WBC 17.7* 13.1*  RBC 4.67 4.67  HCT 44.1 44.4  PLT 237 231   Recent Labs    09/23/22 0550  NA 134*  K 4.1  CL 97*  CO2 27  BUN 26*  CREATININE 1.17  GLUCOSE 132*  CALCIUM 8.5*   No results for input(s): "LABPT", "INR" in the last 72 hours.   EXAM General - Patient is Alert, Appropriate, and Oriented Extremity - ABD soft Sling applied to the left shoulder. Patient with improved sensation to the left arm from recent interscalene block Able to flex and extend fingers and wrist without pain. Reports intact sensation to touch over the hand. Dressing/Incision - clean, dry, no drainage noted to the left shoulder honeycomb dressing. Motor Function - intact, moving foot and toes well on exam.  Abdomen with some distention but intact bowel sounds and no pain with palpation.  Ambulated 15 feet with  physical therapy.  Past Medical History:  Diagnosis Date   Anxiety    Aortic atherosclerosis (HCC)    Asymptomatic bradycardia    Cyst of breast, right 1981   Gastric ulcer    GERD (gastroesophageal reflux disease)    Hepatitis 1988   History of shingles    Hyperlipidemia    Hypertension    Long term current use of anticoagulant    a.) apixaban   Osteoarthritis    PAF (paroxysmal atrial fibrillation) (HCC)    a.) CHA2DS2VASc = 4 (age x2, HTN, vascular disease history);  b.) rate/rhythm maintained on oral metoprolol tartrate; chronically anticoagulated with apixaban   RBBB (right bundle branch block)    Recurrent falls    Skin cancer of face    SSS (sick sinus syndrome) (HCC)     Assessment/Plan: 3 Days Post-Op Procedure(s) (LRB): REVERSE SHOULDER ARTHROPLASTY with BICEPS TENODESIS (Left) Principal Problem:   Status post reverse arthroplasty of shoulder, left  Estimated body mass index is 31.98 kg/m as calculated from the following:   Height as of this encounter: 5\' 10"  (1.778 m).   Weight as of this encounter: 101.1 kg. Advance diet Up with therapy D/C IV fluids when tolerating po intake.  Labs reviewed, WBC 13.1, likely reactive to surgery.  Improving.  No recent fevers.  Recent UA negative for UTI. Reports he is passing gas this morning.  Urinating well. Up with therapy today.  Patient will need SNF based on performance  with PT. Possible d/c to rehab, depending on progression with PT. [facility] bed offer.  Plan for Monday.Marland Kitchen  DVT Prophylaxis - TED hose and Eliquis Non-weightbearing to the left arm.  Dedra Skeens, PA-C [facility] 09/25/2022, 6:47 AM

## 2022-09-25 NOTE — Progress Notes (Signed)
Physical Therapy Treatment Patient Details Name: Steven Welch MRN: [id number] DOB: 03-21-1941 Today's Date: 09/25/2022   History of Present Illness Pt is an 82 y.o. 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  PMH includes anxiety, h/o shingles, HLD, htn, stomach ulcer, B THR, frequent falls, PAF.    PT Comments    Pt in chair wanting to get back to bed.  Was up with nursing this AM who reported struggling with transfers due to post lean and poor mobility.  Stood initially from chair with mod a x 1 on 2nd attempt but pt pushes backwards and is unsafe to attempt steps with +1 assist.  He sits in chair but does not bend or flex trunk in transition.  Worked on trunk flexion in sitting x 8 while awaiting tech to assist.  Stood with min/mod a +2 and while standing he is inc small loose BM.  He is transferred to [facility] but no further BM.  While on [facility] he continues to have excessive post push and is cued to try to sit more upright.  Stands for care and transitions back to bed with very small hesitant steps.  Returns to supine with mod a x 2 and positioned for comfort.   Pt does voice and is aware of post lean and questions why.  Stated he has not had that issue prior.     Recommendations for follow up therapy are one component of a multi-disciplinary discharge planning process, led by the attending physician.  Recommendations may be updated based on patient status, additional functional criteria and insurance authorization.  Follow Up Recommendations       Assistance Recommended at Discharge Frequent or constant Supervision/Assistance  Patient can return home with the following Assistance with cooking/housework;Direct supervision/assist for medications management;Direct supervision/assist for financial management;Assist for transportation;Help with stairs or ramp for entrance;Two people to help with  walking and/or transfers;Two people to help with bathing/dressing/bathroom   Equipment Recommendations       Recommendations for Other Services       Precautions / Restrictions Precautions Precautions: Shoulder;Fall Shoulder Interventions: Shoulder sling/immobilizer;Shoulder abduction pillow Precaution Booklet Issued: No Restrictions Weight Bearing Restrictions: Yes LUE Weight Bearing: Non weight bearing Other Position/Activity Restrictions: NWB'ing through L shoulder or UE     Mobility  Bed Mobility Overal bed mobility: Needs Assistance Bed Mobility: Sit to Supine       Sit to supine: Mod assist, +2 for physical assistance     Patient Response: Cooperative, Flat affect  Transfers Overall transfer level: Needs assistance Equipment used: 2 person hand held assist Transfers: Sit to/from Stand Sit to Stand: Mod assist, +2 physical assistance   Step pivot transfers: Mod assist, +2 physical assistance       General transfer comment: multiple attempts, heavy posterior pushing backwards. Pt required MOD A to redistribute weight    Ambulation/Gait Ambulation/Gait assistance: Min assist, Mod assist, +2 physical assistance Gait Distance (Feet): 3 Feet Assistive device: 2 person hand held assist Gait Pattern/deviations: Step-to pattern, Wide base of support, Leaning posteriorly Gait velocity: decreased     General Gait Details: unable to safely progress gait away from chair today despite +2 assist   Stairs             Wheelchair Mobility    Modified Rankin (Stroke Patients Only)       Balance Overall balance assessment: Needs assistance Sitting-balance support: No upper extremity supported,  Feet supported Sitting balance-Leahy Scale: Poor Sitting balance - Comments: post lean and LOB's close supervision needed at all times. Postural control: Posterior lean Standing balance support: Single extremity supported, During functional activity Standing  balance-Leahy Scale: Poor Standing balance comment: pt is high fall risk. Needs constant assistance during standing activity to prevent falling                            Cognition Arousal/Alertness: Awake/alert Behavior During Therapy: Flat affect Overall Cognitive Status: History of cognitive impairments - at baseline                                          Exercises Other Exercises Other Exercises: focus on trunk flexion during session as post lean remains a barrier Other Exercises: to [facility] for small loose BM    General Comments        Pertinent Vitals/Pain Pain Assessment Pain Assessment: Faces Faces Pain Scale: Hurts little more Pain Location: L shoulder Pain Descriptors / Indicators: Sore Pain Intervention(s): Limited activity within patient's tolerance, Monitored during session, Repositioned    Home Living                          Prior Function            PT Goals (current goals can now be found in the care plan section) Progress towards PT goals: Progressing toward goals    Frequency    BID      PT Plan Current plan remains appropriate    Co-evaluation              AM-PAC PT "6 Clicks" Mobility   Outcome Measure  Help needed turning from your back to your side while in a flat bed without using bedrails?: A Lot Help needed moving from lying on your back to sitting on the side of a flat bed without using bedrails?: A Lot Help needed moving to and from a bed to a chair (including a wheelchair)?: A Lot Help needed standing up from a chair using your arms (e.g., wheelchair or bedside chair)?: A Lot Help needed to walk in hospital room?: A Lot Help needed climbing 3-5 steps with a railing? : Total 6 Click Score: 11    End of Session Equipment Utilized During Treatment: Gait belt Activity Tolerance: Patient tolerated treatment well;Patient limited by fatigue Patient left: in bed;with call bell/phone within  reach;with bed alarm set Nurse Communication: Mobility status PT Visit Diagnosis: Unsteadiness on feet (R26.81);Other abnormalities of gait and mobility (R26.89);Muscle weakness (generalized) (M62.81);History of falling (Z91.81);Pain Pain - Right/Left: Left Pain - part of body: Shoulder     Time: 1610-9604 PT Time Calculation (min) (ACUTE ONLY): 17 min  Charges:  $Therapeutic Activity: 8-22 mins                   Danielle Dess, PTA 09/25/22, 12:00 PM

## 2022-09-26 DIAGNOSIS — M19012 Primary osteoarthritis, left shoulder: Secondary | ICD-10-CM | POA: Diagnosis not present

## 2022-09-26 MED ORDER — ACETAMINOPHEN 500 MG PO TABS: 1000.0000 mg | ORAL_TABLET | Freq: Four times a day (QID) | ORAL | 0 refills | Status: DC | PRN

## 2022-09-26 NOTE — TOC Progression Note (Signed)
Transition of Care [facility]) - Progression Note    Patient Details  Name: Steven Welch MRN: [id number] Date of Birth: November 14, 1940  Transition of Care [facility]) CM/SW Contact  Marlowe Sax, RN Phone Number: 09/26/2022, 8:46 AM  Clinical Narrative:    [employer] Ins pending, ref number [id number], Will check on it again   Expected Discharge Plan: Skilled Nursing Facility Barriers to Discharge: Continued Medical Work up  Expected Discharge Plan and Services       Living arrangements for the past 2 months: Single Family Home Expected Discharge Date: 09/26/22                                     Social Determinants of Health (SDOH) Interventions SDOH Screenings   Food Insecurity: No Food Insecurity (09/22/2022)  Housing: Low Risk  (09/22/2022)  Transportation Needs: No Transportation Needs (09/22/2022)  Utilities: Not At Risk (09/22/2022)  Tobacco Use: Low Risk  (09/23/2022)    Readmission Risk Interventions     No data to display

## 2022-09-26 NOTE — TOC Progression Note (Signed)
Transition of Care [facility]) - Progression Note    Patient Details  Name: Steven Welch MRN: [id number] Date of Birth: February 20, 1941  Transition of Care [facility]) CM/SW Contact  Marlowe Sax, RN Phone Number: 09/26/2022, 9:14 AM  Clinical Narrative:     [facility] called and provided a Approval Auth number has not populated yet, ref number [id number], going to [employer], approved 7/1-7/3   Expected Discharge Plan: Skilled Nursing Facility Barriers to Discharge: Continued Medical Work up  Expected Discharge Plan and Services       Living arrangements for the past 2 months: Single Family Home Expected Discharge Date: 09/26/22                                     Social Determinants of Health (SDOH) Interventions SDOH Screenings   Food Insecurity: No Food Insecurity (09/22/2022)  Housing: Low Risk  (09/22/2022)  Transportation Needs: No Transportation Needs (09/22/2022)  Utilities: Not At Risk (09/22/2022)  Tobacco Use: Low Risk  (09/23/2022)    Readmission Risk Interventions     No data to display

## 2022-09-26 NOTE — Plan of Care (Signed)
Patient discharged per MD orders at this time.All dc instructions, education and medications reviewed with the patient.Pt expressed understanding and will comply with dc instructions.follow up appointments was also communicated to the Pt.no verbal c/o or any ssx pf distress.patient was dc to the [facility] and rehabilitation facility for STR/PT/OT services per order.report was called to staff nurse [facility] before transport.Pt was transported by 2 [facility] personnel on a stretcher.

## 2022-09-26 NOTE — TOC Progression Note (Signed)
Transition of Care [facility]) - Progression Note    Patient Details  Name: Steven Welch MRN: [id number] Date of Birth: [birth year]  Transition of Care [facility]) CM/SW Contact  Marlowe Sax, RN Phone Number: 09/26/2022, 10:03 AM  Clinical Narrative:    Spoke with the patient in the room with his spouse and made aware of the room number at [facility] He will Welch to room 503 at [facility] called he is on the list  Expected Discharge Plan: Skilled Nursing Facility Barriers to Discharge: Continued Medical Work up  Expected Discharge Plan and Services       Living arrangements for the past 2 months: Single Family Home Expected Discharge Date: 09/26/22                                     Social Determinants of Health (SDOH) Interventions SDOH Screenings   Food Insecurity: No Food Insecurity (09/22/2022)  Housing: Low Risk  (09/22/2022)  Transportation Needs: No Transportation Needs (09/22/2022)  Utilities: Not At Risk (09/22/2022)  Tobacco Use: Low Risk  (09/23/2022)    Readmission Risk Interventions     No data to display

## 2022-09-26 NOTE — Progress Notes (Signed)
  Subjective: 4 Days Post-Op Procedure(s) (LRB): REVERSE SHOULDER ARTHROPLASTY with BICEPS TENODESIS (Left) Patient is sleeping well this morning.  Reports mild pain in the shoulder. Patient is  well, sleeping well but he is able to be aroused and answers my questions. Plan is for discharge to [facility], possibly today. Negative for chest pain and shortness of breath Fever: no Gastrointestinal:Negative for nausea and vomiting  Objective: Vital signs in last 24 hours: Temp:  [97.8 F (36.6 C)-99.9 F (37.7 C)] 98.8 F (37.1 C) (07/01 0005) Pulse Rate:  [55-66] 55 (07/01 0005) Resp:  [14-19] 19 (07/01 0005) BP: (133-153)/(76-103) 147/87 (07/01 0005) SpO2:  [92 %-95 %] 94 % (07/01 0005)  Intake/Output from previous day:  Intake/Output Summary (Last 24 hours) at 09/26/2022 0737 Last data filed at 09/25/2022 1600 Gross per 24 hour  Intake 600 ml  Output 501 ml  Net 99 ml    Intake/Output this shift: No intake/output data recorded.  Labs: Recent Labs    09/24/22 0521  HGB 14.7   Recent Labs    09/24/22 0521  WBC 13.1*  RBC 4.67  HCT 44.4  PLT 231   No results for input(s): "NA", "K", "CL", "CO2", "BUN", "CREATININE", "GLUCOSE", "CALCIUM" in the last 72 hours.  No results for input(s): "LABPT", "INR" in the last 72 hours.   EXAM General - Patient is Alert and Appropriate Extremity - ABD soft Sling applied to the left shoulder. Patient with improved sensation to the left arm from recent interscalene block Able to flex and extend fingers and wrist without pain. Reports intact sensation to touch over the hand. Dressing/Incision - clean, dry, no drainage noted to the left shoulder honeycomb dressing. Motor Function - intact, moving foot and toes well on exam.  Abdomen with improved distention but intact bowel sounds and no pain with palpation.  Ambulated 15 feet with physical therapy.  Past Medical History:  Diagnosis Date   Anxiety    Aortic atherosclerosis  (HCC)    Asymptomatic bradycardia    Cyst of breast, right 1981   Gastric ulcer    GERD (gastroesophageal reflux disease)    Hepatitis 1988   History of shingles    Hyperlipidemia    Hypertension    Long term current use of anticoagulant    a.) apixaban   Osteoarthritis    PAF (paroxysmal atrial fibrillation) (HCC)    a.) CHA2DS2VASc = 4 (age x2, HTN, vascular disease history);  b.) rate/rhythm maintained on oral metoprolol tartrate; chronically anticoagulated with apixaban   RBBB (right bundle branch block)    Recurrent falls    Skin cancer of face    SSS (sick sinus syndrome) (HCC)     Assessment/Plan: 4 Days Post-Op Procedure(s) (LRB): REVERSE SHOULDER ARTHROPLASTY with BICEPS TENODESIS (Left) Principal Problem:   Status post reverse arthroplasty of shoulder, left  Estimated body mass index is 31.98 kg/m as calculated from the following:   Height as of this encounter: 5\' 10"  (1.778 m).   Weight as of this encounter: 101.1 kg. Advance diet Up with therapy D/C IV fluids when tolerating po intake.  Vitals reviewed this AM. Urinating well, he has had a BM since surgery. Up with therapy today.  Patient will need SNF based on performance with PT. Plan for possible d/c today to [facility].  DVT Prophylaxis - TED hose and Eliquis Non-weightbearing to the left arm.  Valeria Batman, PA-C [facility] 09/26/2022, 7:37 AM

## 2022-10-04 ENCOUNTER — Encounter: Payer: Self-pay | Admitting: Surgery

## 2022-10-13 ENCOUNTER — Emergency Department: Payer: Medicare Other

## 2022-10-13 ENCOUNTER — Inpatient Hospital Stay
Admission: EM | Admit: 2022-10-13 | Discharge: 2022-10-21 | DRG: 308 | Disposition: A | Discharge status: Skilled Nursing Facility | Payer: Medicare Other | Attending: Internal Medicine | Admitting: Internal Medicine

## 2022-10-13 ENCOUNTER — Other Ambulatory Visit: Payer: Self-pay

## 2022-10-13 DIAGNOSIS — Z801 Family history of malignant neoplasm of trachea, bronchus and lung: Secondary | ICD-10-CM

## 2022-10-13 DIAGNOSIS — R7989 Other specified abnormal findings of blood chemistry: Secondary | ICD-10-CM | POA: Diagnosis present

## 2022-10-13 DIAGNOSIS — R001 Bradycardia, unspecified: Secondary | ICD-10-CM | POA: Diagnosis not present

## 2022-10-13 DIAGNOSIS — I959 Hypotension, unspecified: Secondary | ICD-10-CM | POA: Diagnosis present

## 2022-10-13 DIAGNOSIS — Z8619 Personal history of other infectious and parasitic diseases: Secondary | ICD-10-CM

## 2022-10-13 DIAGNOSIS — I4510 Unspecified right bundle-branch block: Secondary | ICD-10-CM | POA: Diagnosis present

## 2022-10-13 DIAGNOSIS — D62 Acute posthemorrhagic anemia: Secondary | ICD-10-CM | POA: Diagnosis not present

## 2022-10-13 DIAGNOSIS — I480 Paroxysmal atrial fibrillation: Secondary | ICD-10-CM

## 2022-10-13 DIAGNOSIS — K625 Hemorrhage of anus and rectum: Secondary | ICD-10-CM

## 2022-10-13 DIAGNOSIS — Z96612 Presence of left artificial shoulder joint: Secondary | ICD-10-CM

## 2022-10-13 DIAGNOSIS — R55 Syncope and collapse: Secondary | ICD-10-CM | POA: Diagnosis present

## 2022-10-13 DIAGNOSIS — I482 Chronic atrial fibrillation, unspecified: Secondary | ICD-10-CM | POA: Diagnosis present

## 2022-10-13 DIAGNOSIS — Z8249 Family history of ischemic heart disease and other diseases of the circulatory system: Secondary | ICD-10-CM

## 2022-10-13 DIAGNOSIS — E785 Hyperlipidemia, unspecified: Secondary | ICD-10-CM | POA: Diagnosis present

## 2022-10-13 DIAGNOSIS — T447X5A Adverse effect of beta-adrenoreceptor antagonists, initial encounter: Secondary | ICD-10-CM | POA: Diagnosis present

## 2022-10-13 DIAGNOSIS — R296 Repeated falls: Secondary | ICD-10-CM | POA: Diagnosis present

## 2022-10-13 DIAGNOSIS — K5900 Constipation, unspecified: Secondary | ICD-10-CM | POA: Diagnosis not present

## 2022-10-13 DIAGNOSIS — N170 Acute kidney failure with tubular necrosis: Secondary | ICD-10-CM | POA: Diagnosis present

## 2022-10-13 DIAGNOSIS — G4700 Insomnia, unspecified: Secondary | ICD-10-CM | POA: Insufficient documentation

## 2022-10-13 DIAGNOSIS — R42 Dizziness and giddiness: Secondary | ICD-10-CM

## 2022-10-13 DIAGNOSIS — R5381 Other malaise: Secondary | ICD-10-CM | POA: Diagnosis present

## 2022-10-13 DIAGNOSIS — Z8711 Personal history of peptic ulcer disease: Secondary | ICD-10-CM

## 2022-10-13 DIAGNOSIS — N1330 Unspecified hydronephrosis: Secondary | ICD-10-CM | POA: Diagnosis present

## 2022-10-13 DIAGNOSIS — I10 Essential (primary) hypertension: Secondary | ICD-10-CM | POA: Diagnosis present

## 2022-10-13 DIAGNOSIS — Z79899 Other long term (current) drug therapy: Secondary | ICD-10-CM

## 2022-10-13 DIAGNOSIS — F419 Anxiety disorder, unspecified: Secondary | ICD-10-CM | POA: Diagnosis present

## 2022-10-13 DIAGNOSIS — Z7901 Long term (current) use of anticoagulants: Secondary | ICD-10-CM

## 2022-10-13 DIAGNOSIS — N179 Acute kidney failure, unspecified: Principal | ICD-10-CM

## 2022-10-13 DIAGNOSIS — I700 Atherosclerosis of aorta: Secondary | ICD-10-CM | POA: Diagnosis present

## 2022-10-13 DIAGNOSIS — I495 Sick sinus syndrome: Secondary | ICD-10-CM

## 2022-10-13 DIAGNOSIS — Z96643 Presence of artificial hip joint, bilateral: Secondary | ICD-10-CM | POA: Diagnosis present

## 2022-10-13 DIAGNOSIS — Z85828 Personal history of other malignant neoplasm of skin: Secondary | ICD-10-CM

## 2022-10-13 DIAGNOSIS — K219 Gastro-esophageal reflux disease without esophagitis: Secondary | ICD-10-CM | POA: Diagnosis present

## 2022-10-13 DIAGNOSIS — E876 Hypokalemia: Secondary | ICD-10-CM | POA: Insufficient documentation

## 2022-10-13 DIAGNOSIS — L03113 Cellulitis of right upper limb: Secondary | ICD-10-CM | POA: Diagnosis not present

## 2022-10-13 DIAGNOSIS — R066 Hiccough: Secondary | ICD-10-CM | POA: Diagnosis not present

## 2022-10-13 DIAGNOSIS — H9190 Unspecified hearing loss, unspecified ear: Secondary | ICD-10-CM | POA: Diagnosis present

## 2022-10-13 LAB — CBC WITH DIFFERENTIAL/PLATELET
Abs Immature Granulocytes: 0.07 10*3/uL (ref 0.00–0.07)
Basophils Absolute: 0.1 10*3/uL (ref 0.0–0.1)
Basophils Relative: 1 %
Eosinophils Absolute: 0.3 10*3/uL (ref 0.0–0.5)
Eosinophils Relative: 4 %
HCT: 42 % (ref 39.0–52.0)
Hemoglobin: 13.8 g/dL (ref 13.0–17.0)
Immature Granulocytes: 1 %
Lymphocytes Relative: 15 %
Lymphs Abs: 1.3 10*3/uL (ref 0.7–4.0)
MCH: 31.2 pg (ref 26.0–34.0)
MCHC: 32.9 g/dL (ref 30.0–36.0)
MCV: 95 fL (ref 80.0–100.0)
Monocytes Absolute: 0.9 10*3/uL (ref 0.1–1.0)
Monocytes Relative: 10 %
Neutro Abs: 6.3 10*3/uL (ref 1.7–7.7)
Neutrophils Relative %: 69 %
Platelets: 366 10*3/uL (ref 150–400)
RBC: 4.42 MIL/uL (ref 4.22–5.81)
RDW: 12.3 % (ref 11.5–15.5)
WBC: 8.9 10*3/uL (ref 4.0–10.5)
nRBC: 0 % (ref 0.0–0.2)

## 2022-10-13 LAB — COMPREHENSIVE METABOLIC PANEL
ALT: 13 U/L (ref 0–44)
AST: 15 U/L (ref 15–41)
Albumin: 3.5 g/dL (ref 3.5–5.0)
Alkaline Phosphatase: 65 U/L (ref 38–126)
Anion gap: 10 (ref 5–15)
BUN: 54 mg/dL — ABNORMAL HIGH (ref 8–23)
CO2: 28 mmol/L (ref 22–32)
Calcium: 8.7 mg/dL — ABNORMAL LOW (ref 8.9–10.3)
Chloride: 98 mmol/L (ref 98–111)
Creatinine, Ser: 3.23 mg/dL — ABNORMAL HIGH (ref 0.61–1.24)
GFR, Estimated: 19 mL/min — ABNORMAL LOW (ref >60)
Glucose, Bld: 113 mg/dL — ABNORMAL HIGH (ref 70–99)
Potassium: 3.3 mmol/L — ABNORMAL LOW (ref 3.5–5.1)
Sodium: 136 mmol/L (ref 135–145)
Total Bilirubin: 0.5 mg/dL (ref 0.3–1.2)
Total Protein: 7.5 g/dL (ref 6.5–8.1)

## 2022-10-13 LAB — TSH: TSH: 1.096 u[IU]/mL (ref 0.350–4.500)

## 2022-10-13 LAB — MAGNESIUM: Magnesium: 2.7 mg/dL — ABNORMAL HIGH (ref 1.7–2.4)

## 2022-10-13 LAB — TROPONIN I (HIGH SENSITIVITY): Troponin I (High Sensitivity): 19 ng/L — ABNORMAL HIGH (ref <18)

## 2022-10-13 MED ORDER — GLUCAGON HCL RDNA (DIAGNOSTIC) 1 MG IJ SOLR
1.0000 mg | Freq: Once | INTRAMUSCULAR | Status: AC
  Administered 2022-10-13: 1 mg via INTRAVENOUS
  Filled 2022-10-13: qty 1

## 2022-10-13 MED ORDER — SODIUM CHLORIDE 0.9 % IV BOLUS
1000.0000 mL | Freq: Once | INTRAVENOUS | Status: AC
  Administered 2022-10-13: 1000 mL via INTRAVENOUS

## 2022-10-13 MED ORDER — ONDANSETRON HCL 4 MG/2ML IJ SOLN
4.0000 mg | Freq: Once | INTRAMUSCULAR | Status: AC
  Administered 2022-10-13: 4 mg via INTRAVENOUS
  Filled 2022-10-13: qty 2

## 2022-10-13 NOTE — ED Provider Notes (Signed)
[facility] Provider Note    Event Date/Time   First MD Initiated Contact with Patient 10/13/22 2051     (approximate)   History   Near Syncope   HPI  Steven Welch is a 82 y.o. male  here with weakness. Pt just returned home from recent stay for shoulder replacement. He did not eat/drink much when at rehab. He returned home and has been very weak today. He stood up and passed out, and has been dizzy any time he attempts to stand since then. No chest pain. No shortness of breath. His shoulder pain has been improving.       Physical Exam   Triage Vital Signs: ED Triage Vitals [10/13/22 2053]  Encounter Vitals Group     BP      Systolic BP Percentile      Diastolic BP Percentile      Pulse      Resp      Temp 98 F (36.7 C)     Temp Source Oral     SpO2      Weight      Height      Head Circumference      Peak Flow      Pain Score 0     Pain Loc      Pain Education      Exclude from Growth Chart     Most recent vital signs: Vitals:   10/13/22 2315 10/13/22 2330  BP:  (!) 144/73  Pulse: (!) 46 (!) 46  Resp: 15 16  Temp:    SpO2: 97% 90%     General: Awake, no distress.  CV:  Good peripheral perfusion. RRR but bradycardic. Resp:  Normal work of breathing. Lungs clear. Abd:  No distention. No tenderness. Other:  Dry MM.   ED Results / Procedures / Treatments   Labs (all labs ordered are listed, but only abnormal results are displayed) Labs Reviewed  COMPREHENSIVE METABOLIC PANEL - Abnormal; Notable for the following components:      Result Value   Potassium 3.3 (*)    Glucose, Bld 113 (*)    BUN 54 (*)    Creatinine, Ser 3.23 (*)    Calcium 8.7 (*)    GFR, Estimated 19 (*)    All other components within normal limits  MAGNESIUM - Abnormal; Notable for the following components:   Magnesium 2.7 (*)    All other components within normal limits  TROPONIN I (HIGH SENSITIVITY) - Abnormal; Notable for the following  components:   Troponin I (High Sensitivity) 19 (*)    All other components within normal limits  CBC WITH DIFFERENTIAL/PLATELET  TSH     EKG Marked sinus bradycardia, VR 43. PR 190, QRS 154, QTc 473. No acute st elevations or depression.   RADIOLOGY CXR: Clear   I also independently reviewed and agree with radiologist interpretations.   PROCEDURES:  Critical Care performed: No   MEDICATIONS ORDERED IN ED: Medications  glucagon (human recombinant) (GLUCAGEN) injection 1 mg (1 mg Intravenous Given 10/13/22 2140)  ondansetron (ZOFRAN) injection 4 mg (4 mg Intravenous Given 10/13/22 2139)  sodium chloride 0.9 % bolus 1,000 mL (1,000 mLs Intravenous New Bag/Given 10/13/22 2322)     IMPRESSION / MDM / ASSESSMENT AND PLAN / ED COURSE  I reviewed the triage vital signs and the nursing notes.  Differential diagnosis includes, but is not limited to, symptomatic bradycardia, anemia, dehydration, ACS, polypharmacy  Patient's presentation is most consistent with acute presentation with potential threat to life or bodily function.  The patient is on the cardiac monitor to evaluate for evidence of arrhythmia and/or significant heart rate changes  82 yo M here with weakness, bradycardia. Clinically, suspect relative dehydration with medication effect from his medications for Afib. Pt has not been eating/drinking much at facility. Labs confirm this with significant AKI. EKG nonischemic and he has no CP.  CBC unremarkable. Trop 19. TSH normal. CXR shows no active disease.  Minimal improvement in HR with glucagon for beta blocker use. Will admit for IVF and monitoring.    FINAL CLINICAL IMPRESSION(S) / ED DIAGNOSES   Final diagnoses:  AKI (acute kidney injury) (HCC)  Sinus bradycardia     Rx / DC Orders   ED Discharge Orders     None        Note:  This document was prepared using Dragon voice recognition software and may include unintentional  dictation errors.   Shaune Pollack, MD 10/13/22 [phone]

## 2022-10-13 NOTE — ED Triage Notes (Signed)
Pt was BIB [facility] from home d/t call from Daughter and Wife for concerns of "stroke s/s" No stroke s/s noted by EMS. Pt sts he had a near syncopal epsiode earlier today. Felt like he was going to pass out. Eased self down. Did not sustain fall. Denies dizziness at this time. EMS noted pt to be bradycardic in route HR 40s.

## 2022-10-13 NOTE — ED Triage Notes (Addendum)
Per daughter at bedside, pt discharge from rehab earlier today s/t left shoulder surgery. Pt went to get up to get ready for bed. Stated he felt like he was going to pass out. Began to "shake." He was alert but would not respond. Was eased to the floor. Pt did not fall. Episode lasted several mins, less than 5 mins. Daughter sts concern for stroke. No hx seizures. No cp/Sob. No cough/fever/chills/NV. Has had poor appetite and fluid intake. Per daughter pt now at baseline.

## 2022-10-14 ENCOUNTER — Observation Stay (HOSPITAL_COMMUNITY)
Admit: 2022-10-14 | Discharge: 2022-10-14 | Disposition: A | Discharge status: Home / Self Care | Payer: Medicare Other | Attending: Internal Medicine | Admitting: Internal Medicine

## 2022-10-14 DIAGNOSIS — Z79899 Other long term (current) drug therapy: Secondary | ICD-10-CM | POA: Diagnosis not present

## 2022-10-14 DIAGNOSIS — Z96612 Presence of left artificial shoulder joint: Secondary | ICD-10-CM | POA: Diagnosis present

## 2022-10-14 DIAGNOSIS — E876 Hypokalemia: Secondary | ICD-10-CM | POA: Diagnosis present

## 2022-10-14 DIAGNOSIS — N1330 Unspecified hydronephrosis: Secondary | ICD-10-CM | POA: Diagnosis present

## 2022-10-14 DIAGNOSIS — N170 Acute kidney failure with tubular necrosis: Secondary | ICD-10-CM | POA: Diagnosis present

## 2022-10-14 DIAGNOSIS — E785 Hyperlipidemia, unspecified: Secondary | ICD-10-CM | POA: Diagnosis present

## 2022-10-14 DIAGNOSIS — R55 Syncope and collapse: Secondary | ICD-10-CM

## 2022-10-14 DIAGNOSIS — Z8711 Personal history of peptic ulcer disease: Secondary | ICD-10-CM | POA: Diagnosis not present

## 2022-10-14 DIAGNOSIS — I700 Atherosclerosis of aorta: Secondary | ICD-10-CM | POA: Diagnosis present

## 2022-10-14 DIAGNOSIS — I959 Hypotension, unspecified: Secondary | ICD-10-CM | POA: Diagnosis present

## 2022-10-14 DIAGNOSIS — G4700 Insomnia, unspecified: Secondary | ICD-10-CM | POA: Diagnosis present

## 2022-10-14 DIAGNOSIS — I10 Essential (primary) hypertension: Secondary | ICD-10-CM | POA: Diagnosis present

## 2022-10-14 DIAGNOSIS — Z7901 Long term (current) use of anticoagulants: Secondary | ICD-10-CM | POA: Diagnosis not present

## 2022-10-14 DIAGNOSIS — R42 Dizziness and giddiness: Secondary | ICD-10-CM | POA: Diagnosis not present

## 2022-10-14 DIAGNOSIS — N179 Acute kidney failure, unspecified: Principal | ICD-10-CM

## 2022-10-14 DIAGNOSIS — I480 Paroxysmal atrial fibrillation: Secondary | ICD-10-CM

## 2022-10-14 DIAGNOSIS — D62 Acute posthemorrhagic anemia: Secondary | ICD-10-CM | POA: Diagnosis not present

## 2022-10-14 DIAGNOSIS — L03113 Cellulitis of right upper limb: Secondary | ICD-10-CM | POA: Diagnosis not present

## 2022-10-14 DIAGNOSIS — R296 Repeated falls: Secondary | ICD-10-CM | POA: Diagnosis present

## 2022-10-14 DIAGNOSIS — I482 Chronic atrial fibrillation, unspecified: Secondary | ICD-10-CM | POA: Diagnosis present

## 2022-10-14 DIAGNOSIS — Z8249 Family history of ischemic heart disease and other diseases of the circulatory system: Secondary | ICD-10-CM | POA: Diagnosis not present

## 2022-10-14 DIAGNOSIS — I4510 Unspecified right bundle-branch block: Secondary | ICD-10-CM | POA: Diagnosis present

## 2022-10-14 DIAGNOSIS — R001 Bradycardia, unspecified: Secondary | ICD-10-CM | POA: Diagnosis present

## 2022-10-14 DIAGNOSIS — K922 Gastrointestinal hemorrhage, unspecified: Secondary | ICD-10-CM | POA: Diagnosis not present

## 2022-10-14 DIAGNOSIS — I495 Sick sinus syndrome: Secondary | ICD-10-CM | POA: Diagnosis present

## 2022-10-14 DIAGNOSIS — Z85828 Personal history of other malignant neoplasm of skin: Secondary | ICD-10-CM | POA: Diagnosis not present

## 2022-10-14 DIAGNOSIS — K625 Hemorrhage of anus and rectum: Secondary | ICD-10-CM | POA: Diagnosis not present

## 2022-10-14 LAB — MAGNESIUM: Magnesium: 2.4 mg/dL (ref 1.7–2.4)

## 2022-10-14 LAB — BASIC METABOLIC PANEL
Anion gap: 9 (ref 5–15)
BUN: 47 mg/dL — ABNORMAL HIGH (ref 8–23)
CO2: 26 mmol/L (ref 22–32)
Calcium: 7.9 mg/dL — ABNORMAL LOW (ref 8.9–10.3)
Chloride: 105 mmol/L (ref 98–111)
Creatinine, Ser: 2.7 mg/dL — ABNORMAL HIGH (ref 0.61–1.24)
GFR, Estimated: 23 mL/min — ABNORMAL LOW (ref >60)
Glucose, Bld: 92 mg/dL (ref 70–99)
Potassium: 3.1 mmol/L — ABNORMAL LOW (ref 3.5–5.1)
Sodium: 140 mmol/L (ref 135–145)

## 2022-10-14 LAB — ECHOCARDIOGRAM COMPLETE: S' Lateral: 2.6 cm

## 2022-10-14 LAB — CBG MONITORING, ED: Glucose-Capillary: 83 mg/dL (ref 70–99)

## 2022-10-14 MED ORDER — ONDANSETRON HCL 4 MG PO TABS: 4.0000 mg | ORAL_TABLET | Freq: Four times a day (QID) | ORAL | Status: DC | PRN

## 2022-10-14 MED ORDER — OXYCODONE HCL 5 MG PO TABS: 5.0000 mg | ORAL_TABLET | Freq: Four times a day (QID) | ORAL | Status: DC | PRN

## 2022-10-14 MED ORDER — HYDROCHLOROTHIAZIDE 25 MG PO TABS
ORAL_TABLET | ORAL | Status: AC
  Filled 2022-10-14: qty 1

## 2022-10-14 MED ORDER — SODIUM CHLORIDE 0.9% FLUSH
3.0000 mL | Freq: Two times a day (BID) | INTRAVENOUS | Status: DC
  Administered 2022-10-14 – 2022-10-21 (×12): 3 mL via INTRAVENOUS

## 2022-10-14 MED ORDER — APIXABAN 2.5 MG PO TABS
2.5000 mg | ORAL_TABLET | Freq: Two times a day (BID) | ORAL | Status: DC
  Administered 2022-10-14 – 2022-10-19 (×11): 2.5 mg via ORAL
  Filled 2022-10-14 (×11): qty 1

## 2022-10-14 MED ORDER — APIXABAN 5 MG PO TABS
ORAL_TABLET | ORAL | Status: AC
  Filled 2022-10-14: qty 1

## 2022-10-14 MED ORDER — SODIUM CHLORIDE 0.9 % IV SOLN: INTRAVENOUS | Status: AC

## 2022-10-14 MED ORDER — ONDANSETRON HCL 4 MG/2ML IJ SOLN: 4.0000 mg | Freq: Four times a day (QID) | INTRAMUSCULAR | Status: DC | PRN

## 2022-10-14 MED ORDER — ATROPINE SULFATE 1 MG/10ML IJ SOSY: 0.5000 mg | PREFILLED_SYRINGE | INTRAMUSCULAR | Status: DC | PRN

## 2022-10-14 MED ORDER — HYDRALAZINE HCL 25 MG PO TABS
25.0000 mg | ORAL_TABLET | Freq: Three times a day (TID) | ORAL | Status: DC
  Administered 2022-10-14 – 2022-10-16 (×7): 25 mg via ORAL
  Filled 2022-10-14 (×5): qty 1

## 2022-10-14 MED ORDER — ACETAMINOPHEN 325 MG PO TABS
650.0000 mg | ORAL_TABLET | Freq: Four times a day (QID) | ORAL | Status: DC | PRN
  Administered 2022-10-16 – 2022-10-20 (×3): 650 mg via ORAL
  Filled 2022-10-14 (×4): qty 2

## 2022-10-14 MED ORDER — HYDRALAZINE HCL 25 MG PO TABS
ORAL_TABLET | ORAL | Status: AC
  Filled 2022-10-14: qty 1

## 2022-10-14 MED ORDER — ACETAMINOPHEN 650 MG RE SUPP: 650.0000 mg | Freq: Four times a day (QID) | RECTAL | Status: DC | PRN

## 2022-10-14 NOTE — ED Notes (Signed)
Report received from [facility], pt moved to 36. Pt alert and awake with no complaints at this time. Pt awaiting hospital room availability.

## 2022-10-14 NOTE — Assessment & Plan Note (Signed)
Symptomatic bradycardia with history of sick sinus syndrome Patient's heart rate in the 30s to 40s while in the ED, asymptomatic Not currently on rate control agents Cardiology consulted, appreciate further recommendation Staff message sent to neurology for consultation, Dr. Selina Cooley

## 2022-10-14 NOTE — Assessment & Plan Note (Signed)
Continue Eliquis Not on any rate control agents

## 2022-10-14 NOTE — Assessment & Plan Note (Signed)
Avoid rate lowering agents Will continue hydralazine Holding metoprolol and hydrochlorothiazide

## 2022-10-14 NOTE — Assessment & Plan Note (Signed)
Worsening, suspect secondary to poor p.o. intake Echo from 10/14/2022: Read estimated ejection fraction is 60 to 65%.  Right ventricular systolic function is normal.  There is mild left ventricular hypertrophy.  Left ventricular systolic function could not be evaluated. Status post sodium chloride 1 L bolus on admission on 7/18 and no fluids on 7/19 LR 100 mL/h, 1 day ordered Ultrasound of the kidneys ordered Recheck BMP in a.m.

## 2022-10-14 NOTE — Consult Note (Signed)
CARDIOLOGY CONSULT NOTE               Patient ID: Steven Welch MRN: [id number] DOB/AGE: [birth year] 82 y.o.  Admit date: 10/13/2022 Referring Physician Dr. Lindajo Royal hospitalist Primary Physician Dr. Terance Hart primary Primary Cardiologist: [facility] Reason for Consultation near syncope  HPI: Patient is a 82 year old male history of paroxysmal atrial fibrillation sick sinus syndrome hypertension frequent falls seen by cardiology oh months ago patient had shoulder procedure done reportedly had a near syncopal episode at home he had significant bradycardia in the past rates in the high 40s low 50s no previous blockers for the syncope has had renal insufficiency patient would appear to be dehydrated given fluids cardiology was then consulted for further recommendation patient has been maintained on low-dose metoprolol as well as other blood pressure medications heart rate in the 40s on presentation  Review of systems complete and found to be negative unless listed above     Past Medical History:  Diagnosis Date   Anxiety    Aortic atherosclerosis (HCC)    Asymptomatic bradycardia    Cyst of breast, right 1981   Gastric ulcer    GERD (gastroesophageal reflux disease)    Hepatitis 1988   History of shingles    Hyperlipidemia    Hypertension    Long term current use of anticoagulant    a.) apixaban   Osteoarthritis    PAF (paroxysmal atrial fibrillation) (HCC)    a.) CHA2DS2VASc = 4 (age x2, HTN, vascular disease history);  b.) rate/rhythm maintained on oral metoprolol tartrate; chronically anticoagulated with apixaban   RBBB (right bundle branch block)    Recurrent falls    Skin cancer of face    SSS (sick sinus syndrome) (HCC)     Past Surgical History:  Procedure Laterality Date   BREAST EXCISIONAL BIOPSY Right 1981   neg   COLON SURGERY     age 48 month old   REVERSE SHOULDER ARTHROPLASTY Left 09/22/2022   Procedure: REVERSE SHOULDER ARTHROPLASTY with  BICEPS TENODESIS;  Surgeon: Christena Flake, MD;  Location: [facility] ORS;  Service: Orthopedics;  Laterality: Left;   TONSILLECTOMY     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   TOTAL HIP ARTHROPLASTY Right 01/01/2019   Procedure: TOTAL HIP ARTHROPLASTY ANTERIOR APPROACH;  Surgeon: Kennedy Bucker, MD;  Location: [facility] ORS;  Service: Orthopedics;  Laterality: Right;    Medications Prior to Admission  Medication Sig Dispense Refill Last Dose   acetaminophen (TYLENOL) 500 MG tablet Take 2 tablets (1,000 mg total) by mouth every 6 (six) hours as needed (pain). 60 tablet 0 prn at prn   apixaban (ELIQUIS) 5 MG TABS tablet Take 1 tablet (5 mg total) by mouth 2 (two) times daily. 60 tablet 6 10/13/2022 at 2000   hydrALAZINE (APRESOLINE) 25 MG tablet Take 1 tablet by mouth 3 (three) times daily.   10/13/2022   hydrochlorothiazide (HYDRODIURIL) 25 MG tablet Take 25 mg by mouth daily.   10/13/2022   melatonin 3 MG TABS tablet Take 6 mg by mouth at bedtime.   10/12/2022   Menthol, Topical Analgesic, (ICY HOT ORIGINAL PAIN RELIEF) 2.5 % GEL Apply topically as needed.   prn at unk   metoprolol tartrate (LOPRESSOR) 25 MG tablet Take 25 mg by mouth 2 (two) times daily.   10/13/2022   ondansetron (ZOFRAN) 4 MG tablet Take 1 tablet (4 mg total) by mouth every 6 (six) hours as needed for nausea. 30 tablet 0 prn at unk  lidocaine (LIDODERM) 5 % Place 1 patch onto the skin every 12 (twelve) hours. Remove & Discard patch within 12 hours or as directed by MD (Patient not taking: Reported on 10/14/2022) 10 patch 0 Not Taking   Social History   Socioeconomic History   Marital status: Married    Spouse name: Chiropodist   Number of children: Not on file   Years of education: Not on file   Highest education level: Not on file  Occupational History   Not on file  Tobacco Use   Smoking status: Never   Smokeless tobacco: Never  Vaping Use   Vaping status: Never Used  Substance and Sexual Activity   Alcohol use: Not Currently   Drug  use: Never   Sexual activity: Not on file  Other Topics Concern   Not on file  Social History Narrative   Not on file   Social Determinants of Health   Financial Resource Strain: Patient Declined (02/22/2022)   Received from [facility], [facility]   Overall Financial Resource Strain (CARDIA)    Difficulty of Paying Living Expenses: Patient declined  Food Insecurity: No Food Insecurity (10/14/2022)   Hunger Vital Sign    Worried About Running Out of Food in the Last Year: Never true    Ran Out of Food in the Last Year: Never true  Transportation Needs: No Transportation Needs (10/14/2022)   PRAPARE - Administrator, Civil Service (Medical): No    Lack of Transportation (Non-Medical): No  Physical Activity: Not on file  Stress: Not on file  Social Connections: Not on file  Intimate Partner Violence: Not At Risk (10/14/2022)   Humiliation, Afraid, Rape, and Kick questionnaire    Fear of Current or Ex-Partner: No    Emotionally Abused: No    Physically Abused: No    Sexually Abused: No    Family History  Problem Relation Age of Onset   CAD Mother    Lung cancer Father       Review of systems complete and found to be negative unless listed above      PHYSICAL EXAM  General: Well developed, well nourished, in no acute distress HEENT:  Normocephalic and atramatic Neck:  No JVD.  Lungs: Clear bilaterally to auscultation and percussion. Heart: Bradycardia. Normal S1 and S2 without gallops or murmurs.  Abdomen: Bowel sounds are positive, abdomen soft and non-tender  Msk:  Back normal, normal gait. Normal strength and tone for age. Extremities: No clubbing, cyanosis or edema.   Neuro: Alert and oriented X 3. Psych:  Good affect, responds appropriately  Labs:   Lab Results  Component Value Date   WBC 8.9 10/13/2022   HGB 13.8 10/13/2022   HCT 42.0 10/13/2022   MCV 95.0 10/13/2022   PLT 366 10/13/2022    Recent Labs   Lab 10/13/22 2109 10/14/22 0830  NA 136 140  K 3.3* 3.1*  CL 98 105  CO2 28 26  BUN 54* 47*  CREATININE 3.23* 2.70*  CALCIUM 8.7* 7.9*  PROT 7.5  --   BILITOT 0.5  --   ALKPHOS 65  --   ALT 13  --   AST 15  --   GLUCOSE 113* 92   No results found for: "CKTOTAL", "CKMB", "CKMBINDEX", "TROPONINI" No results found for: "CHOL" No results found for: "HDL" No results found for: "LDLCALC" No results found for: "TRIG" No results found for: "CHOLHDL" No results found for: "LDLDIRECT"  Radiology: ECHOCARDIOGRAM COMPLETE  Result Date: 10/14/2022    ECHOCARDIOGRAM REPORT   Patient Name:   ZURI BRADWAY Date of Exam: 10/14/2022 Medical Rec #:  [id number]    Height:       70.0 in Accession #:    [id number]   Weight:       222.9 lb Date of Birth:  October 24, 1940     BSA:          2.186 m Patient Age:    82 years     BP:           Not listed in chart/Not listed in                                            chart mmHg Patient Gender: M            HR:           Not listed in chart bpm. Exam Location:  [facility] Procedure: 2D Echo, Cardiac Doppler and Color Doppler Indications:     Syncope R55  History:         Patient has prior history of Echocardiogram examinations, most                  recent 01/08/2021. Arrythmias:RBBB; Risk Factors:Hypertension                  and Dyslipidemia.  Sonographer:     Cristela Blue Referring Phys:  [id number] Andris Baumann Diagnosing Phys: Debbe Odea MD  Sonographer Comments: Technically challenging study due to limited acoustic windows, no apical window and no subcostal window. Pt left arm is in a sling--kept supine. IMPRESSIONS  1. Left ventricular ejection fraction, by estimation, is 60 to 65%. Left ventricular ejection fraction by PLAX is 60 %. The left ventricle has normal function. The left ventricle has no regional wall motion abnormalities. There is mild left ventricular hypertrophy. Left ventricular diastolic function could not be evaluated.  2. Right ventricular systolic  function is normal. The right ventricular size is not well visualized.  3. The mitral valve is normal in structure. No evidence of mitral valve regurgitation.  4. The aortic valve is grossly normal. Aortic valve regurgitation is not visualized. FINDINGS  Left Ventricle: Left ventricular ejection fraction, by estimation, is 60 to 65%. Left ventricular ejection fraction by PLAX is 60 %. The left ventricle has normal function. The left ventricle has no regional wall motion abnormalities. The left ventricular internal cavity size was normal in size. There is mild left ventricular hypertrophy. Left ventricular diastolic function could not be evaluated. Right Ventricle: The right ventricular size is not well visualized. No increase in right ventricular wall thickness. Right ventricular systolic function is normal. Left Atrium: Left atrial size was normal in size. Right Atrium: Right atrial size was not well visualized. Pericardium: There is no evidence of pericardial effusion. Mitral Valve: The mitral valve is normal in structure. No evidence of mitral valve regurgitation. Tricuspid Valve: The tricuspid valve is not well visualized. Tricuspid valve regurgitation is not demonstrated. Aortic Valve: The aortic valve is grossly normal. Aortic valve regurgitation is not visualized. Pulmonic Valve: The pulmonic valve was not well visualized. Pulmonic valve regurgitation is not visualized. Aorta: The aortic root is normal in size and structure. Venous: The inferior vena cava was not well visualized. IAS/Shunts: No atrial level shunt detected by color  flow Doppler.  LEFT VENTRICLE PLAX 2D LV EF:         Left ventricular ejection fraction by PLAX is 60 %. LVIDd:         3.80 cm LVIDs:         2.60 cm LV PW:         1.10 cm LV IVS:        1.10 cm LVOT diam:     2.10 cm LVOT Area:     3.46 cm  LEFT ATRIUM         Index LA diam:    2.40 cm 1.10 cm/m   AORTA Ao Root diam: 3.30 cm  SHUNTS Systemic Diam: 2.10 cm Debbe Odea MD  Electronically signed by Debbe Odea MD Signature Date/Time: 10/14/2022/10:12:14 AM    Final    DG Chest Portable 1 View  Result Date: 10/13/2022 CLINICAL DATA:  Weakness EXAM: PORTABLE CHEST 1 VIEW COMPARISON:  Chest x-ray 08/18/2022 FINDINGS: The heart and mediastinal contours are within normal limits. No focal consolidation. No pulmonary edema. No pleural effusion. No pneumothorax. No acute osseous abnormality. Partially visualize reversed left shoulder arthroplasty. Severe degenerative changes of the right glenohumeral joint. IMPRESSION: No active disease. Electronically Signed   By: Tish Frederickson M.D.   On: 10/13/2022 21:42   DG Shoulder Left Port  Result Date: 09/22/2022 CLINICAL DATA:  Status post reverse arthroplasty of left shoulder. EXAM: LEFT SHOULDER COMPARISON:  None. FINDINGS: Reverse left shoulder arthroplasty in expected alignment. No periprosthetic lucency or fracture. Recent postsurgical change includes air and edema in the soft tissues with skin staples in place. IMPRESSION: Reverse left shoulder arthroplasty without immediate postoperative complication. Electronically Signed   By: Narda Rutherford M.D.   On: 09/22/2022 16:09   [place] OR NERVE BLOCK-IMAGE ONLY [facility])  Result Date: 09/22/2022 There is no interpretation for this exam.  This order is for images obtained during a surgical procedure.  Please See "Surgeries" Tab for more information regarding the procedure.    EKG: Sinus bradycardia right bundle branch block rate of 45 nonspecific ST-T wave changes  ASSESSMENT AND PLAN:  Near syncope Bradycardia Paroxysmal A-fib Acute renal insufficiency Hypertension Shoulder surgery . Plan Recommend discontinue metoprolol because of bradycardia.  Will consider resuming at a lower dose once the rate improved Patient has any evidence of tachycardia will consider permanent pacemaker and then resuming rate control agent Continue blood pressure control with  hydralazine Continue anticoagulation for atrial fibrillation with Eliquis Recommend hydration for what appears to be acute renal insufficiency consult nephrology Correct electrolytes Do not recommend permanent pacemaker placement at this point  Signed: Alwyn Pea MD 10/14/2022, 4:17 PM

## 2022-10-14 NOTE — Progress Notes (Signed)
*  PRELIMINARY RESULTS* Echocardiogram 2D Echocardiogram has been performed.  Cristela Blue 10/14/2022, 7:44 AM

## 2022-10-14 NOTE — H&P (Signed)
History and Physical    Patient: Steven Welch WUJ:[id number] DOB: Aug 24, 1940 DOA: 10/13/2022 DOS: the patient was seen and examined on 10/14/2022 PCP: Dorothey Baseman, MD  Patient coming from: Home  Chief Complaint:  Chief Complaint  Patient presents with   Near Syncope    HPI: Steven Welch is a 82 y.o. male with medical history significant for paroxysmal atrial fibrillation, sick sinus syndrome, hypertension, frequent falls, last seen by cardiology 08/2022 who is s/p left shoulder surgery who presents following a syncopal event at home. He had no preceding symptoms.  He did not get hurt when he fell.  He had been recovering well from his surgery. ED course and data reviewed: Patient bradycardic to the mid to high 40s with otherwise normal vitals.  Labs notable for creatinine of 3.23 up from baseline of 1.  Troponin 19. Chest x-ray nonacute EKG with bradycardia Patient was treated with an IV fluid bolus.  He was placed on O2 at 2 L due to O2 sat into the mid 80s while asleep. Hospitalist consulted for admission.   Review of Systems: As mentioned in the history of present illness. All other systems reviewed and are negative.  Past Medical History:  Diagnosis Date   Anxiety    Aortic atherosclerosis (HCC)    Asymptomatic bradycardia    Cyst of breast, right 1981   Gastric ulcer    GERD (gastroesophageal reflux disease)    Hepatitis 1988   History of shingles    Hyperlipidemia    Hypertension    Long term current use of anticoagulant    a.) apixaban   Osteoarthritis    PAF (paroxysmal atrial fibrillation) (HCC)    a.) CHA2DS2VASc = 4 (age x2, HTN, vascular disease history);  b.) rate/rhythm maintained on oral metoprolol tartrate; chronically anticoagulated with apixaban   RBBB (right bundle branch block)    Recurrent falls    Skin cancer of face    SSS (sick sinus syndrome) (HCC)    Past Surgical History:  Procedure Laterality Date   BREAST EXCISIONAL BIOPSY Right 1981    neg   COLON SURGERY     age 26 month old   REVERSE SHOULDER ARTHROPLASTY Left 09/22/2022   Procedure: REVERSE SHOULDER ARTHROPLASTY with BICEPS TENODESIS;  Surgeon: Christena Flake, MD;  Location: [facility] ORS;  Service: Orthopedics;  Laterality: Left;   TONSILLECTOMY     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   TOTAL HIP ARTHROPLASTY Right 01/01/2019   Procedure: TOTAL HIP ARTHROPLASTY ANTERIOR APPROACH;  Surgeon: Kennedy Bucker, MD;  Location: [facility] ORS;  Service: Orthopedics;  Laterality: Right;   Social History:  reports that he has never smoked. He has never used smokeless tobacco. He reports that he does not currently use alcohol. He reports that he does not use drugs.  No Known Allergies  Family History  Problem Relation Age of Onset   CAD Mother    Lung cancer Father     Prior to Admission medications   Medication Sig Start Date End Date Taking? Authorizing Provider  acetaminophen (TYLENOL) 500 MG tablet Take 2 tablets (1,000 mg total) by mouth every 6 (six) hours as needed (pain). 09/26/22  Yes Anson Oregon, PA-C  apixaban (ELIQUIS) 5 MG TABS tablet Take 1 tablet (5 mg total) by mouth 2 (two) times daily. 03/01/21  Yes Antonieta Iba, MD  hydrALAZINE (APRESOLINE) 25 MG tablet Take 1 tablet by mouth 3 (three) times daily. 12/28/21  Yes [provider]  hydrochlorothiazide (HYDRODIURIL) 25 MG  tablet Take 25 mg by mouth daily.   Yes [provider]  metoprolol tartrate (LOPRESSOR) 25 MG tablet Take 25 mg by mouth 2 (two) times daily.   Yes [provider]  lidocaine (LIDODERM) 5 % Place 1 patch onto the skin every 12 (twelve) hours. Remove & Discard patch within 12 hours or as directed by MD 08/18/22 08/18/23  Delton Prairie, MD  Menthol, Topical Analgesic, (ICY HOT ORIGINAL PAIN RELIEF) 2.5 % GEL Apply topically as needed.    [provider]  ondansetron (ZOFRAN) 4 MG tablet Take 1 tablet (4 mg total) by mouth every 6 (six) hours as needed for nausea.  09/23/22   Anson Oregon, PA-C    Physical Exam: Vitals:   10/14/22 0145 10/14/22 0200 10/14/22 0215 10/14/22 0230  BP:      Pulse: (!) 42 (!) 47 (!) 46 (!) 45  Resp: (!) 5 11 14 16   Temp:      TempSrc:      SpO2: 97% 98% 99% 100%   Physical Exam Vitals and nursing note reviewed.  Constitutional:      General: He is not in acute distress. HENT:     Head: Normocephalic and atraumatic.  Cardiovascular:     Rate and Rhythm: Regular rhythm. Bradycardia present.     Heart sounds: Normal heart sounds.  Pulmonary:     Effort: Pulmonary effort is normal.     Breath sounds: Normal breath sounds.  Abdominal:     Palpations: Abdomen is soft.     Tenderness: There is no abdominal tenderness.  Neurological:     Mental Status: Mental status is at baseline.     Labs on Admission: I have personally reviewed following labs and imaging studies  CBC: Recent Labs  Lab 10/13/22 2109  WBC 8.9  NEUTROABS 6.3  HGB 13.8  HCT 42.0  MCV 95.0  PLT 366   Basic Metabolic Panel: Recent Labs  Lab 10/13/22 2109  NA 136  K 3.3*  CL 98  CO2 28  GLUCOSE 113*  BUN 54*  CREATININE 3.23*  CALCIUM 8.7*  MG 2.7*   GFR: CrCl cannot be calculated (Unknown ideal weight.). Liver Function Tests: Recent Labs  Lab 10/13/22 2109  AST 15  ALT 13  ALKPHOS 65  BILITOT 0.5  PROT 7.5  ALBUMIN 3.5   No results for input(s): "LIPASE", "AMYLASE" in the last 168 hours. No results for input(s): "AMMONIA" in the last 168 hours. Coagulation Profile: No results for input(s): "INR", "PROTIME" in the last 168 hours. Cardiac Enzymes: No results for input(s): "CKTOTAL", "CKMB", "CKMBINDEX", "TROPONINI" in the last 168 hours. BNP (last 3 results) No results for input(s): "PROBNP" in the last 8760 hours. HbA1C: No results for input(s): "HGBA1C" in the last 72 hours. CBG: No results for input(s): "GLUCAP" in the last 168 hours. Lipid Profile: No results for input(s): "CHOL", "HDL", "LDLCALC",  "TRIG", "CHOLHDL", "LDLDIRECT" in the last 72 hours. Thyroid Function Tests: Recent Labs    10/13/22 2109  TSH 1.096   Anemia Panel: No results for input(s): "VITAMINB12", "FOLATE", "FERRITIN", "TIBC", "IRON", "RETICCTPCT" in the last 72 hours. Urine analysis:    Component Value Date/Time   COLORURINE YELLOW (A) 09/09/2022 1442   APPEARANCEUR CLEAR (A) 09/09/2022 1442   APPEARANCEUR Clear 02/12/2014 1357   LABSPEC 1.018 09/09/2022 1442   LABSPEC 1.018 02/12/2014 1357   PHURINE 5.0 09/09/2022 1442   GLUCOSEU NEGATIVE 09/09/2022 1442   GLUCOSEU Negative 02/12/2014 1357   HGBUR NEGATIVE  09/09/2022 1442   BILIRUBINUR NEGATIVE 09/09/2022 1442   BILIRUBINUR Negative 02/12/2014 1357   KETONESUR NEGATIVE 09/09/2022 1442   PROTEINUR 100 (A) 09/09/2022 1442   NITRITE NEGATIVE 09/09/2022 1442   LEUKOCYTESUR NEGATIVE 09/09/2022 1442   LEUKOCYTESUR Negative 02/12/2014 1357    Radiological Exams on Admission: DG Chest Portable 1 View  Result Date: 10/13/2022 CLINICAL DATA:  Weakness EXAM: PORTABLE CHEST 1 VIEW COMPARISON:  Chest x-ray 08/18/2022 FINDINGS: The heart and mediastinal contours are within normal limits. No focal consolidation. No pulmonary edema. No pleural effusion. No pneumothorax. No acute osseous abnormality. Partially visualize reversed left shoulder arthroplasty. Severe degenerative changes of the right glenohumeral joint. IMPRESSION: No active disease. Electronically Signed   By: Tish Frederickson M.D.   On: 10/13/2022 21:42     Data Reviewed: Relevant notes from primary care and specialist visits, past discharge summaries as available in EHR, including Care Everywhere. Prior diagnostic testing as pertinent to current admission diagnoses Updated medications and problem lists for reconciliation ED course, including vitals, labs, imaging, treatment and response to treatment Triage notes, nursing and pharmacy notes and ED provider's notes Notable results as noted in  HPI   Assessment and Plan: * Postural dizziness with presyncope Symptomatic bradycardia with history of sick sinus syndrome Patient's heart rate in the 30s to 40s while in the ED, asymptomatic Not currently on rate control agents Cardiology consult Pacer paddles at bedside  PAF (paroxysmal atrial fibrillation) (HCC) Continue Eliquis Not on any rate control agents  AKI (acute kidney injury) (HCC) Creatinine above 3 with baseline of 1 IV hydration Holding home thiazide diuretic  Status post reverse arthroplasty of shoulder, left Patient denies pain and had no injury from fall  Hypertension Avoid rate lowering agents Will continue hydralazine Holding metoprolol and hydrochlorothiazide        DVT prophylaxis: Eliquis  Consults: cardiology Dr Juliann Pares  Advance Care Planning:   Code Status: Full Code   Family Communication: none  Disposition Plan: Back to previous home environment  Severity of Illness: The appropriate patient status for this patient is INPATIENT. Inpatient status is judged to be reasonable and necessary in order to provide the required intensity of service to ensure the patient's safety. The patient's presenting symptoms, physical exam findings, and initial radiographic and laboratory data in the context of their chronic comorbidities is felt to place them at high risk for further clinical deterioration. Furthermore, it is not anticipated that the patient will be medically stable for discharge from the hospital within 2 midnights of admission.   * I certify that at the point of admission it is my clinical judgment that the patient will require inpatient hospital care spanning beyond 2 midnights from the point of admission due to high intensity of service, high risk for further deterioration and high frequency of surveillance required.*  Author: Andris Baumann, MD 10/14/2022 2:35 AM  For on call review [email or web address].

## 2022-10-14 NOTE — ED Notes (Signed)
Sec notified of needing transport arranged

## 2022-10-14 NOTE — ED Notes (Signed)
Pt SpO2 intermittently dropping to mid 80% while sleeping. When awake pts spo2 increased to 95-99% on room air. Pt placed on 2L nasal cannula while sleeping for comfort.

## 2022-10-14 NOTE — ED Notes (Signed)
Discussed pts HR management with admitting Para March. Informed pt HR is now sustaining low 40s upper 30s. BP remains stable Per MD, PRN orders to be placed.

## 2022-10-14 NOTE — Progress Notes (Signed)
  INTERVAL PROGRESS NOTE    Steven Welch- 82 y.o. male  LOS: 0 __________________________________________________________________  SUBJECTIVE: Admitted 10/13/2022 with cc of  Chief Complaint  Patient presents with   Near Syncope   Since admission, remains stable  OBJECTIVE: Blood pressure (!) 161/79, pulse (!) 42, temperature 97.7 F (36.5 C), temperature source Oral, resp. rate 12, SpO2 99%.  General: NAD, pleasant, able to participate in exam. Appears to have memory issues and hard of hearing Cardiac: RRR, normal heart sounds, no murmurs. 2+ radial and PT pulses bilaterally Respiratory: CTAB, normal effort, No wheezes, rales or rhonchi Extremities: R arm in sling Skin: warm and dry, no rashes noted Neuro: alert and oriented, no focal deficits Psych: Normal affect and mood   ASSESSMENT/PLAN:  I have reviewed the full H&P by Dr. Para March, and I agree with the assessment and plan as outlined therein. In addition:  Awaiting cardiology consult.  Continue to hold metoprolol    Principal Problem:   Postural dizziness with presyncope Active Problems:   Symptomatic bradycardia   SSS (sick sinus syndrome) (HCC)   PAF (paroxysmal atrial fibrillation) (HCC)   AKI (acute kidney injury) (HCC)   Hypertension   Status post reverse arthroplasty of shoulder, left   Leeroy Bock, DO [facility] 10/14/2022, 8:52 AM    [email or web address] Available by Epic secure chat 7AM-7PM. If 7PM-7AM, please contact night-coverage   No Charge

## 2022-10-14 NOTE — Assessment & Plan Note (Signed)
Patient denies pain and had no injury from fall

## 2022-10-15 ENCOUNTER — Encounter: Payer: Self-pay | Admitting: Internal Medicine

## 2022-10-15 ENCOUNTER — Inpatient Hospital Stay: Payer: Medicare Other

## 2022-10-15 DIAGNOSIS — R42 Dizziness and giddiness: Secondary | ICD-10-CM | POA: Diagnosis not present

## 2022-10-15 DIAGNOSIS — E876 Hypokalemia: Secondary | ICD-10-CM | POA: Insufficient documentation

## 2022-10-15 DIAGNOSIS — G4700 Insomnia, unspecified: Secondary | ICD-10-CM | POA: Insufficient documentation

## 2022-10-15 DIAGNOSIS — R55 Syncope and collapse: Secondary | ICD-10-CM | POA: Diagnosis not present

## 2022-10-15 LAB — BASIC METABOLIC PANEL
Anion gap: 8 (ref 5–15)
BUN: 49 mg/dL — ABNORMAL HIGH (ref 8–23)
CO2: 28 mmol/L (ref 22–32)
Calcium: 8.5 mg/dL — ABNORMAL LOW (ref 8.9–10.3)
Chloride: 103 mmol/L (ref 98–111)
Creatinine, Ser: 3.08 mg/dL — ABNORMAL HIGH (ref 0.61–1.24)
GFR, Estimated: 20 mL/min — ABNORMAL LOW (ref >60)
Glucose, Bld: 105 mg/dL — ABNORMAL HIGH (ref 70–99)
Potassium: 3.4 mmol/L — ABNORMAL LOW (ref 3.5–5.1)
Sodium: 139 mmol/L (ref 135–145)

## 2022-10-15 LAB — CBC
HCT: 39.8 % (ref 39.0–52.0)
Hemoglobin: 13.1 g/dL (ref 13.0–17.0)
MCH: 31 pg (ref 26.0–34.0)
MCHC: 32.9 g/dL (ref 30.0–36.0)
MCV: 94.3 fL (ref 80.0–100.0)
Platelets: 322 10*3/uL (ref 150–400)
RBC: 4.22 MIL/uL (ref 4.22–5.81)
RDW: 12.6 % (ref 11.5–15.5)
WBC: 7.4 10*3/uL (ref 4.0–10.5)
nRBC: 0 % (ref 0.0–0.2)

## 2022-10-15 LAB — GLUCOSE, CAPILLARY: Glucose-Capillary: 89 mg/dL (ref 70–99)

## 2022-10-15 MED ORDER — LACTATED RINGERS IV SOLN: INTRAVENOUS | Status: AC

## 2022-10-15 MED ORDER — TRAZODONE HCL 50 MG PO TABS
50.0000 mg | ORAL_TABLET | Freq: Every evening | ORAL | Status: AC | PRN
  Administered 2022-10-15: 50 mg via ORAL
  Filled 2022-10-15: qty 1

## 2022-10-15 NOTE — Progress Notes (Addendum)
PROGRESS NOTE    Steven Welch  VHQ:[id number] DOB: 29-Jan-1941 DOA: 10/13/2022 PCP: Dorothey Baseman, MD   No notes on file  Brief Narrative:   [name] is an 82 year old male   Patient has poor PO intake, didn't want to eat food at bedside today.  Social History: he lives at home with his wife.  Assessment & Plan:   Principal Problem:   Postural dizziness with presyncope Active Problems:   Symptomatic bradycardia   SSS (sick sinus syndrome) (HCC)   PAF (paroxysmal atrial fibrillation) (HCC)   AKI (acute kidney injury) (HCC)   Hypertension   Status post reverse arthroplasty of shoulder, left   Hypokalemia   Insomnia   Assessment and Plan:  * Postural dizziness with presyncope Symptomatic bradycardia with history of sick sinus syndrome Patient's heart rate in the 30s to 40s while in the ED, asymptomatic Not currently on rate control agents Cardiology consulted, appreciate further recommendation Staff message sent to neurology for consultation, Dr. Selina Cooley  PAF (paroxysmal atrial fibrillation) (HCC) Continue Eliquis Not on any rate control agents  AKI (acute kidney injury) (HCC) Worsening, suspect secondary to poor p.o. intake Echo from 10/14/2022: Read estimated ejection fraction is 60 to 65%.  Right ventricular systolic function is normal.  There is mild left ventricular hypertrophy.  Left ventricular systolic function could not be evaluated. Status post sodium chloride 1 L bolus on admission on 7/18 and no fluids on 7/19 LR 100 mL/h, 1 day ordered Ultrasound of the kidneys ordered Recheck BMP in a.m.  Insomnia Patient does not remember prior sleep aids Trazodone 50 mg nightly as needed for sleep, 1 dose ordered as a trial  Hypokalemia Mild, suspect secondary to poor p.o. intake Serum magnesium on admission was within normal limits at 2.4 LR 100 mL/h, 1 day ordered Recheck BMP in a.m.  Status post reverse arthroplasty of shoulder, left Patient denies  pain and had no injury from fall  Hypertension Avoid rate lowering agents with syncopal events, currently avoiding beta blockade/heart rate lowering antihypertensive medication Hydralazine 25 mg 3 times daily Cardiology consulted  DVT prophylaxis: Eliquis Code Status: Full code Family Communication: No, nursing states that patient was updated Disposition Plan: Pending neurology consultation, pending ultrasound of the kidneys, pending PT, OT Level of care: Progressive  Consultants:  Cardiology, neurology  Procedures:  None at this time  Antimicrobials: Not indicated at this time  Subjective:  Patient states that he had trouble sleeping last night and requesting sleeping medication.  He denies chest pain, shortness of breath.  He denies being weak.  Nursing at bedside states that he was able to sit up in his chair a little bit today and he appeared weak to her.  I was at bedside when dinner was brought in and patient declined to eat it stating that he was not feeling hungry.  Objective: Vitals:   10/15/22 0310 10/15/22 0845 10/15/22 1158 10/15/22 1715  BP: 111/67 (!) 136/95 (!) 158/85 (!) 160/84  Pulse: (!) 52 (!) 50 62 94  Resp: 18 16 16 20   Temp: 98.1 F (36.7 C) 98.1 F (36.7 C) 97.8 F (36.6 C) 97.9 F (36.6 C)  TempSrc:    Oral  SpO2: 100% 100% 100% 96%    Intake/Output Summary (Last 24 hours) at 10/15/2022 1937 Last data filed at 10/15/2022 1000 Gross per 24 hour  Intake 103 ml  Output 400 ml  Net -297 ml   There were no vitals filed for this visit.  Examination:  General exam: Appears calm and comfortable  Respiratory system: Clear to auscultation. Respiratory effort normal. Cardiovascular system: S1 & S2 heard, RRR. No JVD, murmurs, rubs, gallops or clicks. No pedal edema. Gastrointestinal system: Abdomen is nondistended, soft and nontender. No organomegaly or masses felt. Normal bowel sounds heard. Central nervous system: Alert and oriented. No focal  neurological deficits. Extremities: Symmetric 5 x 5 power. Skin: No rashes, lesions or ulcers Psychiatry: Judgement and insight appear normal. Mood & affect appropriate.   Data Reviewed: I have personally reviewed following labs and imaging studies  CBC: Recent Labs  Lab 10/13/22 2109 10/15/22 0348  WBC 8.9 7.4  NEUTROABS 6.3  --   HGB 13.8 13.1  HCT 42.0 39.8  MCV 95.0 94.3  PLT 366 322   Basic Metabolic Panel: Recent Labs  Lab 10/13/22 2109 10/14/22 0830 10/15/22 0348  NA 136 140 139  K 3.3* 3.1* 3.4*  CL 98 105 103  CO2 28 26 28   GLUCOSE 113* 92 105*  BUN 54* 47* 49*  CREATININE 3.23* 2.70* 3.08*  CALCIUM 8.7* 7.9* 8.5*  MG 2.7* 2.4  --    GFR: CrCl cannot be calculated (Unknown ideal weight.). Liver Function Tests: Recent Labs  Lab 10/13/22 2109  AST 15  ALT 13  ALKPHOS 65  BILITOT 0.5  PROT 7.5  ALBUMIN 3.5   No results for input(s): "LIPASE", "AMYLASE" in the last 168 hours. No results for input(s): "AMMONIA" in the last 168 hours. Coagulation Profile: No results for input(s): "INR", "PROTIME" in the last 168 hours. Cardiac Enzymes: No results for input(s): "CKTOTAL", "CKMB", "CKMBINDEX", "TROPONINI" in the last 168 hours. BNP (last 3 results) No results for input(s): "PROBNP" in the last 8760 hours. HbA1C: No results for input(s): "HGBA1C" in the last 72 hours. CBG: Recent Labs  Lab 10/14/22 0503 10/15/22 0518  GLUCAP 83 89   Lipid Profile: No results for input(s): "CHOL", "HDL", "LDLCALC", "TRIG", "CHOLHDL", "LDLDIRECT" in the last 72 hours. Thyroid Function Tests: Recent Labs    10/13/22 2109  TSH 1.096   Anemia Panel: No results for input(s): "VITAMINB12", "FOLATE", "FERRITIN", "TIBC", "IRON", "RETICCTPCT" in the last 72 hours. Sepsis Labs: No results for input(s): "PROCALCITON", "LATICACIDVEN" in the last 168 hours.  No results found for this or any previous visit (from the past 240 hour(s)).   Radiology  Studies: ECHOCARDIOGRAM COMPLETE  Result Date: 10/14/2022    ECHOCARDIOGRAM REPORT   Patient Name:   Steven Welch Date of Exam: 10/14/2022 Medical Rec #:  [id number]    Height:       70.0 in Accession #:    [id number]   Weight:       222.9 lb Date of Birth:  Aug 07, 1940     BSA:          2.186 m Patient Age:    82 years     BP:           Not listed in chart/Not listed in                                            chart mmHg Patient Gender: M            HR:           Not listed in chart bpm. Exam Location:  [facility] Procedure: 2D Echo, Cardiac Doppler and Color Doppler Indications:  Syncope R55  History:         Patient has prior history of Echocardiogram examinations, most                  recent 01/08/2021. Arrythmias:RBBB; Risk Factors:Hypertension                  and Dyslipidemia.  Sonographer:     Cristela Blue Referring Phys:  [id number] Andris Baumann Diagnosing Phys: Debbe Odea MD  Sonographer Comments: Technically challenging study due to limited acoustic windows, no apical window and no subcostal window. Pt left arm is in a sling--kept supine. IMPRESSIONS  1. Left ventricular ejection fraction, by estimation, is 60 to 65%. Left ventricular ejection fraction by PLAX is 60 %. The left ventricle has normal function. The left ventricle has no regional wall motion abnormalities. There is mild left ventricular hypertrophy. Left ventricular diastolic function could not be evaluated.  2. Right ventricular systolic function is normal. The right ventricular size is not well visualized.  3. The mitral valve is normal in structure. No evidence of mitral valve regurgitation.  4. The aortic valve is grossly normal. Aortic valve regurgitation is not visualized. FINDINGS  Left Ventricle: Left ventricular ejection fraction, by estimation, is 60 to 65%. Left ventricular ejection fraction by PLAX is 60 %. The left ventricle has normal function. The left ventricle has no regional wall motion abnormalities. The left ventricular  internal cavity size was normal in size. There is mild left ventricular hypertrophy. Left ventricular diastolic function could not be evaluated. Right Ventricle: The right ventricular size is not well visualized. No increase in right ventricular wall thickness. Right ventricular systolic function is normal. Left Atrium: Left atrial size was normal in size. Right Atrium: Right atrial size was not well visualized. Pericardium: There is no evidence of pericardial effusion. Mitral Valve: The mitral valve is normal in structure. No evidence of mitral valve regurgitation. Tricuspid Valve: The tricuspid valve is not well visualized. Tricuspid valve regurgitation is not demonstrated. Aortic Valve: The aortic valve is grossly normal. Aortic valve regurgitation is not visualized. Pulmonic Valve: The pulmonic valve was not well visualized. Pulmonic valve regurgitation is not visualized. Aorta: The aortic root is normal in size and structure. Venous: The inferior vena cava was not well visualized. IAS/Shunts: No atrial level shunt detected by color flow Doppler.  LEFT VENTRICLE PLAX 2D LV EF:         Left ventricular ejection fraction by PLAX is 60 %. LVIDd:         3.80 cm LVIDs:         2.60 cm LV PW:         1.10 cm LV IVS:        1.10 cm LVOT diam:     2.10 cm LVOT Area:     3.46 cm  LEFT ATRIUM         Index LA diam:    2.40 cm 1.10 cm/m   AORTA Ao Root diam: 3.30 cm  SHUNTS Systemic Diam: 2.10 cm Debbe Odea MD Electronically signed by Debbe Odea MD Signature Date/Time: 10/14/2022/10:12:14 AM    Final    DG Chest Portable 1 View  Result Date: 10/13/2022 CLINICAL DATA:  Weakness EXAM: PORTABLE CHEST 1 VIEW COMPARISON:  Chest x-ray 08/18/2022 FINDINGS: The heart and mediastinal contours are within normal limits. No focal consolidation. No pulmonary edema. No pleural effusion. No pneumothorax. No acute osseous abnormality. Partially visualize reversed left shoulder arthroplasty. Severe degenerative changes  of the right glenohumeral joint. IMPRESSION: No active disease. Electronically Signed   By: Tish Frederickson M.D.   On: 10/13/2022 21:42    Scheduled Meds:  apixaban  2.5 mg Oral BID   hydrALAZINE  25 mg Oral TID   sodium chloride flush  3 mL Intravenous Q12H   Continuous Infusions:  lactated ringers 100 mL/hr at 10/15/22 1845     LOS: 1 day   Time spent: 58  Dr. Sedalia Muta [facility]   If 7PM-7AM, please contact night-coverage  10/15/2022, 7:37 PM

## 2022-10-15 NOTE — Assessment & Plan Note (Addendum)
Patient does not remember prior sleep aids Trazodone 50 mg nightly as needed for sleep, 1 dose ordered as a trial

## 2022-10-15 NOTE — Plan of Care (Signed)

## 2022-10-15 NOTE — Assessment & Plan Note (Signed)
Mild, suspect secondary to poor p.o. intake Serum magnesium on admission was within normal limits at 2.4 LR 100 mL/h, 1 day ordered Recheck BMP in a.m.

## 2022-10-15 NOTE — Plan of Care (Signed)
Patient has cognitive decline, teaching is done with daughter and wife

## 2022-10-15 NOTE — Progress Notes (Signed)
[facility]    SUBJECTIVE: Patient resting comfortably denies any worsening chest pain no worsening vertigo dizziness denies shortness of breath   Vitals:   10/14/22 2154 10/14/22 2305 10/15/22 0310 10/15/22 0845  BP: 126/68 126/70 111/67 (!) 136/95  Pulse: 63 (!) 57 (!) 52 (!) 50  Resp: 20 18 18 16   Temp:  97.7 F (36.5 C) 98.1 F (36.7 C) 98.1 F (36.7 C)  TempSrc:      SpO2: 96% 95% 100% 100%     Intake/Output Summary (Last 24 hours) at 10/15/2022 1140 Last data filed at 10/15/2022 1000 Gross per 24 hour  Intake 1103 ml  Output 400 ml  Net 703 ml      PHYSICAL EXAM  General: Well developed, well nourished, in no acute distress HEENT:  Normocephalic and atramatic Neck:  No JVD.  Lungs: Clear bilaterally to auscultation and percussion. Heart: Irregular bradycardic  S1 and S2 without gallops or murmurs.  Abdomen: Bowel sounds are positive, abdomen soft and non-tender  Msk:  Back normal, normal gait. Normal strength and tone for age. Extremities: No clubbing, cyanosis or edema.   Neuro: Alert and oriented X 3. Psych:  Good affect, responds appropriately   LABS: Basic Metabolic Panel: Recent Labs    10/13/22 2109 10/14/22 0830 10/15/22 0348  NA 136 140 139  K 3.3* 3.1* 3.4*  CL 98 105 103  CO2 28 26 28   GLUCOSE 113* 92 105*  BUN 54* 47* 49*  CREATININE 3.23* 2.70* 3.08*  CALCIUM 8.7* 7.9* 8.5*  MG 2.7* 2.4  --    Liver Function Tests: Recent Labs    10/13/22 2109  AST 15  ALT 13  ALKPHOS 65  BILITOT 0.5  PROT 7.5  ALBUMIN 3.5   No results for input(s): "LIPASE", "AMYLASE" in the last 72 hours. CBC: Recent Labs    10/13/22 2109 10/15/22 0348  WBC 8.9 7.4  NEUTROABS 6.3  --   HGB 13.8 13.1  HCT 42.0 39.8  MCV 95.0 94.3  PLT 366 322   Cardiac Enzymes: No results for input(s): "CKTOTAL", "CKMB", "CKMBINDEX", "TROPONINI" in the last 72 hours. BNP: Invalid input(s): "POCBNP" D-Dimer: No results for input(s): "DDIMER" in the last 72  hours. Hemoglobin A1C: No results for input(s): "HGBA1C" in the last 72 hours. Fasting Lipid Panel: No results for input(s): "CHOL", "HDL", "LDLCALC", "TRIG", "CHOLHDL", "LDLDIRECT" in the last 72 hours. Thyroid Function Tests: Recent Labs    10/13/22 2109  TSH 1.096   Anemia Panel: No results for input(s): "VITAMINB12", "FOLATE", "FERRITIN", "TIBC", "IRON", "RETICCTPCT" in the last 72 hours.  ECHOCARDIOGRAM COMPLETE  Result Date: 10/14/2022    ECHOCARDIOGRAM REPORT   Patient Name:   DIN BOOKWALTER Date of Exam: 10/14/2022 Medical Rec #:  [id number]    Height:       70.0 in Accession #:    [id number]   Weight:       222.9 lb Date of Birth:  27-Oct-1940     BSA:          2.186 m Patient Age:    82 years     BP:           Not listed in chart/Not listed in                                            chart mmHg Patient Gender: M  HR:           Not listed in chart bpm. Exam Location:  [facility] Procedure: 2D Echo, Cardiac Doppler and Color Doppler Indications:     Syncope R55  History:         Patient has prior history of Echocardiogram examinations, most                  recent 01/08/2021. Arrythmias:RBBB; Risk Factors:Hypertension                  and Dyslipidemia.  Sonographer:     Cristela Blue Referring Phys:  [id number] Andris Baumann Diagnosing Phys: Debbe Odea MD  Sonographer Comments: Technically challenging study due to limited acoustic windows, no apical window and no subcostal window. Pt left arm is in a sling--kept supine. IMPRESSIONS  1. Left ventricular ejection fraction, by estimation, is 60 to 65%. Left ventricular ejection fraction by PLAX is 60 %. The left ventricle has normal function. The left ventricle has no regional wall motion abnormalities. There is mild left ventricular hypertrophy. Left ventricular diastolic function could not be evaluated.  2. Right ventricular systolic function is normal. The right ventricular size is not well visualized.  3. The mitral valve is normal in  structure. No evidence of mitral valve regurgitation.  4. The aortic valve is grossly normal. Aortic valve regurgitation is not visualized. FINDINGS  Left Ventricle: Left ventricular ejection fraction, by estimation, is 60 to 65%. Left ventricular ejection fraction by PLAX is 60 %. The left ventricle has normal function. The left ventricle has no regional wall motion abnormalities. The left ventricular internal cavity size was normal in size. There is mild left ventricular hypertrophy. Left ventricular diastolic function could not be evaluated. Right Ventricle: The right ventricular size is not well visualized. No increase in right ventricular wall thickness. Right ventricular systolic function is normal. Left Atrium: Left atrial size was normal in size. Right Atrium: Right atrial size was not well visualized. Pericardium: There is no evidence of pericardial effusion. Mitral Valve: The mitral valve is normal in structure. No evidence of mitral valve regurgitation. Tricuspid Valve: The tricuspid valve is not well visualized. Tricuspid valve regurgitation is not demonstrated. Aortic Valve: The aortic valve is grossly normal. Aortic valve regurgitation is not visualized. Pulmonic Valve: The pulmonic valve was not well visualized. Pulmonic valve regurgitation is not visualized. Aorta: The aortic root is normal in size and structure. Venous: The inferior vena cava was not well visualized. IAS/Shunts: No atrial level shunt detected by color flow Doppler.  LEFT VENTRICLE PLAX 2D LV EF:         Left ventricular ejection fraction by PLAX is 60 %. LVIDd:         3.80 cm LVIDs:         2.60 cm LV PW:         1.10 cm LV IVS:        1.10 cm LVOT diam:     2.10 cm LVOT Area:     3.46 cm  LEFT ATRIUM         Index LA diam:    2.40 cm 1.10 cm/m   AORTA Ao Root diam: 3.30 cm  SHUNTS Systemic Diam: 2.10 cm Debbe Odea MD Electronically signed by Debbe Odea MD Signature Date/Time: 10/14/2022/10:12:14 AM    Final    DG  Chest Portable 1 View  Result Date: 10/13/2022 CLINICAL DATA:  Weakness EXAM: PORTABLE CHEST 1 VIEW COMPARISON:  Chest x-ray 08/18/2022  FINDINGS: The heart and mediastinal contours are within normal limits. No focal consolidation. No pulmonary edema. No pleural effusion. No pneumothorax. No acute osseous abnormality. Partially visualize reversed left shoulder arthroplasty. Severe degenerative changes of the right glenohumeral joint. IMPRESSION: No active disease. Electronically Signed   By: Tish Frederickson M.D.   On: 10/13/2022 21:42     Echo preserved left ventricular function EF around 60%  TELEMETRY: Atrial fibrillation bradycardia rate 60  ASSESSMENT AND PLAN:  Principal Problem:   Postural dizziness with presyncope Active Problems:   Hypertension   Status post reverse arthroplasty of shoulder, left   PAF (paroxysmal atrial fibrillation) (HCC)   SSS (sick sinus syndrome) (HCC)   AKI (acute kidney injury) (HCC)   Symptomatic bradycardia    Plan Intermittent recurrent vertigo consider meclizine consider evaluation by neurology or ENT Hypertension reasonably controlled HCTZ hydralazine metoprolol Atrial fibrillation chronic stable continue Eliquis metoprolol Bradycardia while in atrial fibrillation would recommend discontinue metoprolol and then consider resuming at a much lower dose Acute renal sufficiency consider hydration as necessary Continue conservative management for now no indication for permanent pacemaker Renal insufficiency recommend nephrology input for management   Alwyn Pea, MD, 10/15/2022 11:40 AM

## 2022-10-16 DIAGNOSIS — R55 Syncope and collapse: Secondary | ICD-10-CM | POA: Diagnosis not present

## 2022-10-16 DIAGNOSIS — R42 Dizziness and giddiness: Secondary | ICD-10-CM

## 2022-10-16 LAB — CBC
HCT: 38.8 % — ABNORMAL LOW (ref 39.0–52.0)
Hemoglobin: 13.1 g/dL (ref 13.0–17.0)
MCH: 31.3 pg (ref 26.0–34.0)
MCHC: 33.8 g/dL (ref 30.0–36.0)
MCV: 92.6 fL (ref 80.0–100.0)
Platelets: 308 10*3/uL (ref 150–400)
RBC: 4.19 MIL/uL — ABNORMAL LOW (ref 4.22–5.81)
RDW: 12.9 % (ref 11.5–15.5)
WBC: 7.8 10*3/uL (ref 4.0–10.5)
nRBC: 0 % (ref 0.0–0.2)

## 2022-10-16 LAB — BASIC METABOLIC PANEL
Anion gap: 9 (ref 5–15)
BUN: 44 mg/dL — ABNORMAL HIGH (ref 8–23)
CO2: 25 mmol/L (ref 22–32)
Calcium: 8.8 mg/dL — ABNORMAL LOW (ref 8.9–10.3)
Chloride: 105 mmol/L (ref 98–111)
Creatinine, Ser: 2.97 mg/dL — ABNORMAL HIGH (ref 0.61–1.24)
GFR, Estimated: 20 mL/min — ABNORMAL LOW (ref >60)
Glucose, Bld: 115 mg/dL — ABNORMAL HIGH (ref 70–99)
Potassium: 3.3 mmol/L — ABNORMAL LOW (ref 3.5–5.1)
Sodium: 139 mmol/L (ref 135–145)

## 2022-10-16 LAB — GLUCOSE, CAPILLARY: Glucose-Capillary: 99 mg/dL (ref 70–99)

## 2022-10-16 MED ORDER — HYDRALAZINE HCL 50 MG PO TABS
ORAL_TABLET | ORAL | Status: AC
  Filled 2022-10-16: qty 1

## 2022-10-16 MED ORDER — POTASSIUM CHLORIDE 20 MEQ PO PACK
40.0000 meq | PACK | Freq: Once | ORAL | Status: AC
  Administered 2022-10-16: 40 meq via ORAL
  Filled 2022-10-16: qty 2

## 2022-10-16 MED ORDER — HYDRALAZINE HCL 20 MG/ML IJ SOLN
10.0000 mg | Freq: Four times a day (QID) | INTRAMUSCULAR | Status: DC | PRN
  Administered 2022-10-16: 10 mg via INTRAVENOUS
  Filled 2022-10-16: qty 1

## 2022-10-16 MED ORDER — HYDRALAZINE HCL 50 MG PO TABS
50.0000 mg | ORAL_TABLET | Freq: Three times a day (TID) | ORAL | Status: DC
  Administered 2022-10-16 – 2022-10-21 (×16): 50 mg via ORAL
  Filled 2022-10-16 (×15): qty 1

## 2022-10-16 MED ORDER — HYDRALAZINE HCL 50 MG PO TABS: 50.0000 mg | ORAL_TABLET | Freq: Three times a day (TID) | ORAL | Status: DC

## 2022-10-16 NOTE — TOC Progression Note (Addendum)
Transition of Care [facility]) - Progression Note    Patient Details  Name: Steven Welch MRN: [id number] Date of Birth: [birth year]  Transition of Care [facility]) CM/SW Contact  Bing Quarry, RN Phone Number: 10/16/2022, 11:39 AM  Clinical Narrative: 7/21: Patient was discharged from [facility] on 10/13/22. Spouse took patient home but around 8 pm she states patient became disoriented and confused. Spouse called EMS and was admitted via [facility] ED for dx of postural dizziness with syncope with noted bradycardia. No fall. Placed on acute oxygen a 2L/[place]   PMH noted: Htx of aFib/Eliquis, AKI with worsening values and rehydration, SSS, and s/p reverse arthroplasty of LEFT shoulder 09/22/22 with subsequent discharge to [facility] for STR.   PT evaluations this admission as of this am recommending STR. Spoke with spouse and she would like patient to return to [facility]. Messaged AC at [facility] about potential of returning and Medicare days left. Will submit referral via electronic Hub.   PCP: Dr. Dorothey Baseman Contacts:  Ronak, Duquette (Spouse) [phone] ([address]  Geraldo Docker, (Daughter) [phone] ([place])  Gabriel Cirri MSN RN CM  Transitions of Care Department [facility] [phone] Weekends Only          Expected Discharge Plan and Services                                               Social Determinants of Health (SDOH) Interventions SDOH Screenings   Food Insecurity: No Food Insecurity (10/14/2022)  Housing: Low Risk  (10/14/2022)  Transportation Needs: No Transportation Needs (10/14/2022)  Utilities: Not At Risk (10/14/2022)  Financial Resource Strain: Patient Declined (02/22/2022)   Received from [facility], [facility]  Tobacco Use: Low Risk  (09/22/2022)    Readmission Risk Interventions     No data to display

## 2022-10-16 NOTE — Plan of Care (Signed)

## 2022-10-16 NOTE — Progress Notes (Signed)
Patient ID: Steven Welch, male   DOB: [birth year], 82 y.o.   MRN: [id number] [facility] Cardiology    SUBJECTIVE: Patient has no complaints today no [place] or syncope no nausea vomiting no leg swelling no significant shortness of breath no worsening chest pain   Vitals:   10/15/22 2337 10/16/22 0439 10/16/22 0756 10/16/22 1227  BP: (!) 164/75 (!) 165/88 (!) 182/80 (!) 156/79  Pulse: 66 (!) 58 (!) 59 66  Resp: 18 18 16 16   Temp: 98.1 F (36.7 C) 98.2 F (36.8 C) 97.9 F (36.6 C) 98.3 F (36.8 C)  TempSrc: Oral Oral    SpO2: 98% 97% 100% 100%     Intake/Output Summary (Last 24 hours) at 10/16/2022 1326 Last data filed at 10/16/2022 0505 Gross per 24 hour  Intake 3 ml  Output 1200 ml  Net -1197 ml      PHYSICAL EXAM  General: Well developed, well nourished, in no acute distress HEENT:  Normocephalic and atramatic Neck:  No JVD.  Lungs: Clear bilaterally to auscultation and percussion. Heart: HRRR . Normal S1 and S2 without gallops or murmurs.  Abdomen: Bowel sounds are positive, abdomen soft and non-tender  Msk:  Back normal, normal gait. Normal strength and tone for age. Extremities: No clubbing, cyanosis or edema.   Neuro: Alert and oriented X 3. Psych:  Good affect, responds appropriately   LABS: Basic Metabolic Panel: Recent Labs    10/13/22 2109 10/14/22 0830 10/15/22 0348 10/16/22 0409  NA 136 140 139 139  K 3.3* 3.1* 3.4* 3.3*  CL 98 105 103 105  CO2 28 26 28 25   GLUCOSE 113* 92 105* 115*  BUN 54* 47* 49* 44*  CREATININE 3.23* 2.70* 3.08* 2.97*  CALCIUM 8.7* 7.9* 8.5* 8.8*  MG 2.7* 2.4  --   --    Liver Function Tests: Recent Labs    10/13/22 2109  AST 15  ALT 13  ALKPHOS 65  BILITOT 0.5  PROT 7.5  ALBUMIN 3.5   No results for input(s): "LIPASE", "AMYLASE" in the last 72 hours. CBC: Recent Labs    10/13/22 2109 10/15/22 0348 10/16/22 0409  WBC 8.9 7.4 7.8  NEUTROABS 6.3  --   --   HGB 13.8 13.1 13.1  HCT 42.0 39.8 38.8*  MCV 95.0 94.3  92.6  PLT 366 322 308   Cardiac Enzymes: No results for input(s): "CKTOTAL", "CKMB", "CKMBINDEX", "TROPONINI" in the last 72 hours. BNP: Invalid input(s): "POCBNP" D-Dimer: No results for input(s): "DDIMER" in the last 72 hours. Hemoglobin A1C: No results for input(s): "HGBA1C" in the last 72 hours. Fasting Lipid Panel: No results for input(s): "CHOL", "HDL", "LDLCALC", "TRIG", "CHOLHDL", "LDLDIRECT" in the last 72 hours. Thyroid Function Tests: Recent Labs    10/13/22 2109  TSH 1.096   Anemia Panel: No results for input(s): "VITAMINB12", "FOLATE", "FERRITIN", "TIBC", "IRON", "RETICCTPCT" in the last 72 hours.  US RENAL  Result Date: 10/15/2022 CLINICAL DATA:  Acute renal insufficiency. EXAM: RENAL / URINARY TRACT ULTRASOUND COMPLETE COMPARISON:  None Available. FINDINGS: Evaluation is very limited due to body habitus and overlying bowel gas. Right Kidney: Renal measurements: 12.2 x 6.9 x 6.6 cm = volume: 291 mL. Normal echogenicity. There is mild right hydronephrosis. No shadowing stone. Left Kidney: Renal measurements: 11.0 x 6.0 x 6.3 cm = volume: 213 mL. Normal echogenicity. Mild left hydronephrosis. No shadowing stone. Bladder: Appears normal for degree of bladder distention. Other: None. IMPRESSION: Mild bilateral hydronephrosis.  No shadowing stone. Electronically Signed  By: Elgie Collard M.D.   On: 10/15/2022 21:23     Echo of left ventricular function of 60%  TELEMETRY: Atrial fibrillation rate of 65 nonspecific ST-T wave changes:  ASSESSMENT AND PLAN:  Principal Problem:   Postural dizziness with presyncope Active Problems:   Hypertension   Status post reverse arthroplasty of shoulder, left   PAF (paroxysmal atrial fibrillation) (HCC)   SSS (sick sinus syndrome) (HCC)   AKI (acute kidney injury) (HCC)   Symptomatic bradycardia   Hypokalemia   Insomnia    Plan Postural hypotension dizziness and weakness stable recommend continued hydration Intermittent  recurrent vertigo consider meclizine consider evaluation by neurology or ENT Hypertension reasonably controlled HCTZ hydralazine metoprolol Atrial fibrillation chronic stable continue Eliquis metoprolol Bradycardia while in atrial fibrillation would recommend discontinue metoprolol and then consider resuming at a much lower dose Acute renal sufficiency consider hydration as necessary Continue conservative management for now no indication for permanent pacemaker Renal insufficiency recommend nephrology input for management Sick sinus syndrome no clinic is for permanent pacemaker at this stage recommend conservative management   Alwyn Pea, MD 10/16/2022 1:26 PM

## 2022-10-16 NOTE — NC FL2 (Signed)
[place] MEDICAID FL2 LEVEL OF CARE FORM     IDENTIFICATION  Patient Name: Steven Welch Birthdate: 1940-07-22 Sex: male Admission Date (Current Location): 10/13/2022  [facility] and [place] Number:  Chiropodist and Address:  [facility], [address], [place], [place] [address]      Provider Number: [id number]  Attending Physician Name and Address:  Darlin Priestly, MD  Relative Name and Phone Number:  Dajuan, Turnley (Spouse)  [phone] [facility])    Current Level of Care: Hospital Recommended Level of Care: Skilled Nursing Facility Prior Approval Number:    Date Approved/Denied:   PASRR Number: [id number]  Discharge Plan:      Current Diagnoses: Patient Active Problem List   Diagnosis Date Noted   Hypokalemia 10/15/2022   Insomnia 10/15/2022   Postural dizziness with presyncope 10/14/2022   AKI (acute kidney injury) (HCC) 10/14/2022   Symptomatic bradycardia 10/14/2022   PAF (paroxysmal atrial fibrillation) (HCC)    SSS (sick sinus syndrome) (HCC)    Status post reverse arthroplasty of shoulder, 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   Elevated troponin 04/04/2021   Hypertension    Recurrent falls    Unsteady gait    Sepsis (HCC) 01/07/2021   Atrial fibrillation with rapid ventricular response (HCC) 01/07/2021   Status post total hip replacement, 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    Orientation RESPIRATION BLADDER Height & Weight     Self, Time, Situation, Place  Normal Incontinent Weight:   Height:     BEHAVIORAL SYMPTOMS/MOOD NEUROLOGICAL BOWEL NUTRITION STATUS      Continent Diet (heart)  AMBULATORY STATUS COMMUNICATION OF NEEDS Skin   Limited Assist Verbally Surgical wounds (L shoulder)                       Personal Care Assistance Level of Assistance  Bathing, Feeding, Dressing Bathing Assistance: Limited assistance Feeding assistance: Limited assistance Dressing Assistance: Limited assistance     Functional Limitations Info             SPECIAL  CARE FACTORS FREQUENCY  PT (By licensed PT), OT (By licensed OT)     PT Frequency: 5 times per week OT Frequency: 5 times per week            Contractures      Additional Factors Info  Code Status, Allergies Code Status Info: full Allergies Info: nka           Current Medications (10/16/2022):  This is the current hospital active medication list Current Facility-Administered Medications  Medication Dose Route Frequency Provider Last Rate Last Admin   acetaminophen (TYLENOL) tablet 650 mg  650 mg Oral Q6H PRN Andris Baumann, MD       Or   acetaminophen (TYLENOL) suppository 650 mg  650 mg Rectal Q6H PRN Andris Baumann, MD       apixaban Everlene Balls) tablet 2.5 mg  2.5 mg Oral BID Lindajo Royal V, MD   2.5 mg at 10/16/22 0909   atropine 1 MG/10ML injection 0.5 mg  0.5 mg Intravenous PRN Andris Baumann, MD       hydrALAZINE (APRESOLINE) injection 10 mg  10 mg Intravenous Q6H PRN Darlin Priestly, MD   10 mg at 10/16/22 1216   hydrALAZINE (APRESOLINE) tablet 50 mg  50 mg Oral TID Darlin Priestly, MD       lactated ringers infusion   Intravenous Continuous Darlin Priestly, MD 75 mL/hr at 10/16/22 1216 Rate Change at 10/16/22 1216   ondansetron (ZOFRAN)  tablet 4 mg  4 mg Oral Q6H PRN Andris Baumann, MD       Or   ondansetron [facility]) injection 4 mg  4 mg Intravenous Q6H PRN Andris Baumann, MD       oxyCODONE (Oxy IR/ROXICODONE) immediate release tablet 5 mg  5 mg Oral Q6H PRN Leeroy Bock, MD       sodium chloride flush (NS) 0.9 % injection 3 mL  3 mL Intravenous Q12H Andris Baumann, MD   3 mL at 10/15/22 2233     Discharge Medications: Please see discharge summary for a list of discharge medications.  Relevant Imaging Results:  Relevant Lab Results:   Additional Information SS #: [id number]  Geanette Buonocore E Samyra Limb, LCSW

## 2022-10-16 NOTE — Evaluation (Addendum)
Physical Therapy Evaluation Patient Details Name: Steven Welch MRN: [id number] DOB: 04-20-1940 Today's Date: 10/16/2022  History of Present Illness  Pt is an 82 y/o male admitted after sustaining a fall at home secondary to a syncopal episode. Pt with no injuries from the fall. Pt found to have intermittent recurrent vertigo. 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   Clinical Impression  Pt presented supine in bed with HOB elevated, awake and willing to participate in therapy session. Prior to admission, pt reported that he was using a cane PRN to ambulate and did not require any assistance for ADLs. However, per chart review from previous recent admission, pt required assistance from wife with ADL/IADL as needed, assist for LB dressing, bathing at baseline, and assistance for IADLs. Pt also stating that his wife was assisting him with bed mobility and transfers. Pt lives with his wife in a single level home with two steps to enter (no rail). At the time of evaluation, pt required mod A for bed mobility, min A for transfers and min A to ambulate a very short distance with 1HHA on R. Pt acknowledging that his wife may need assistance from others to care for him upon d/c; however, PT recommending short-term rehab at a SNF to maximize his independence and safety with functional mobility prior to returning home with family support. Unsure if pt/family would be agreeable to this plan. Pt would continue to benefit from skilled physical therapy services at this time while admitted and after d/c to address the below listed limitations in order to improve overall safety and independence with functional mobility.       Assistance Recommended at Discharge Frequent or constant Supervision/Assistance  If plan is discharge home, recommend the following:  Can travel by private vehicle  A lot of help with walking and/or  transfers;A lot of help with bathing/dressing/bathroom;Assistance with cooking/housework;Assist for transportation;Help with stairs or ramp for entrance   No    Equipment Recommendations Other (comment) (defer to next venue of care)  Recommendations for Other Services       Functional Status Assessment Patient has had a recent decline in their functional status and demonstrates the ability to make significant improvements in function in a reasonable and predictable amount of time.     Precautions / Restrictions Precautions Precautions: Fall Precaution Comments: recent L RSA (6/27) Restrictions Weight Bearing Restrictions: Yes Other Position/Activity Restrictions: no orders in chart, but maintained NWB L UE due to recent L RSA; pt unable to accurately report on the status of his L shoulder with no family/caregivers present during evaluation      Mobility  Bed Mobility Overal bed mobility: Needs Assistance Bed Mobility: Supine to Sit, Sit to Supine     Supine to sit: Mod assist, HOB elevated Sit to supine: Mod assist   General bed mobility comments: increased time and effort, HOB elevated, pt able to manage his bilateral LEs off of bed, required mod A for trunk elevation to achieve an upright sitting position at EOB, mod A to return bilateral LEs onto bed    Transfers Overall transfer level: Needs assistance Equipment used: 1 person hand held assist Transfers: Sit to/from Stand Sit to Stand: From elevated surface, Min assist           General transfer comment: PT providing HHA on pt's R, min A for stability with transitional movements    Ambulation/Gait Ambulation/Gait assistance: Min assist Gait  Distance (Feet): 3 Feet Assistive device: 1 person hand held assist Gait Pattern/deviations: Step-to pattern, Decreased step length - right, Decreased step length - left, Decreased stride length, Shuffle Gait velocity: decreased     General Gait Details: pt initially able  to take 4 side steps bilaterally at EOB with PT providing min A with 1HHA. pt was then able to progress to taking several steps forwards and backwards (3' x2) with 1HHA and min A for stability; pt very cautious with his movement and confirming that he feels unsteady  Stairs            Wheelchair Mobility     Tilt Bed    Modified Rankin (Stroke Patients Only)       Balance Overall balance assessment: Needs assistance, History of Falls Sitting-balance support: Feet supported Sitting balance-Leahy Scale: Fair     Standing balance support: During functional activity, Single extremity supported Standing balance-Leahy Scale: Poor                               Pertinent Vitals/Pain Pain Assessment Pain Assessment: Faces Faces Pain Scale: Hurts little more Pain Location: R wrist Pain Descriptors / Indicators: Sore, Guarding Pain Intervention(s): Monitored during session    Home Living Family/patient expects to be discharged to:: Private residence Living Arrangements: Spouse/significant other Available Help at Discharge: Family;Available PRN/intermittently Type of Home: House Home Access: Stairs to enter Entrance Stairs-Rails: None Entrance Stairs-Number of Steps: 2   Home Layout: One level Home Equipment: Cane - single Librarian, academic (2 wheels)      Prior Function Prior Level of Function : Needs assist             Mobility Comments: PRN use of SPC per pt report. pt also stating that his wife has to assist him with bed mobility and sit<>stand transfers from the bed, but no other assistance from wife needed per pt report (no family/caregivers present to confirm this information and pt with cognitive decline per chart) ADLs Comments: pt reporting that he does not require assistance with ADLs; however, per previous recent admission hx - wife assists with ADL/IADL as needed; pt requires assist for LB dressing, bathing at baseline; assist for IADLs from  wife     Hand Dominance        Extremity/Trunk Assessment   Upper Extremity Assessment Upper Extremity Assessment: Difficult to assess due to impaired cognition;LUE deficits/detail LUE Deficits / Details: maintained NWB'ing throughout, no orders in chart and pt unable to give any information regarding his precautions or what he is allowed to do at this time with no family members present to give any information either    Lower Extremity Assessment Lower Extremity Assessment: Generalized weakness       Communication   Communication: No difficulties  Cognition Arousal/Alertness: Awake/alert Behavior During Therapy: WFL for tasks assessed/performed Overall Cognitive Status: History of cognitive impairments - at baseline Area of Impairment: Orientation, Attention, Memory, Following commands, Safety/judgement, Awareness, Problem solving                 Orientation Level: Disoriented to, Situation Current Attention Level: Sustained Memory: Decreased short-term memory, Decreased recall of precautions Following Commands: Follows one step commands with increased time Safety/Judgement: Decreased awareness of deficits, Decreased awareness of safety Awareness: Emergent Problem Solving: Requires verbal cues, Requires tactile cues          General Comments      Exercises  Assessment/Plan    PT Assessment Patient needs continued PT services  PT Problem List Decreased strength;Decreased range of motion;Decreased activity tolerance;Decreased balance;Decreased mobility;Decreased coordination;Decreased cognition;Decreased safety awareness;Decreased knowledge of use of DME;Decreased knowledge of precautions       PT Treatment Interventions DME instruction;Gait training;Functional mobility training;Stair training;Therapeutic activities;Therapeutic exercise;Balance training;Neuromuscular re-education;Cognitive remediation;Patient/family education    PT Goals (Current goals can  be found in the Care Plan section)  Acute Rehab PT Goals Patient Stated Goal: to get his health issues addressed PT Goal Formulation: With patient Time For Goal Achievement: 10/30/22 Potential to Achieve Goals: Fair    Frequency Min 3X/week     Co-evaluation               AM-PAC PT "6 Clicks" Mobility  Outcome Measure Help needed turning from your back to your side while in a flat bed without using bedrails?: A Little Help needed moving from lying on your back to sitting on the side of a flat bed without using bedrails?: A Little Help needed moving to and from a bed to a chair (including a wheelchair)?: A Little Help needed standing up from a chair using your arms (e.g., wheelchair or bedside chair)?: A Little Help needed to walk in hospital room?: A Lot Help needed climbing 3-5 steps with a railing? : Total 6 Click Score: 15    End of Session Equipment Utilized During Treatment: Gait belt Activity Tolerance: Patient tolerated treatment well Patient left: in bed;with call bell/phone within reach;with bed alarm set Nurse Communication: Mobility status PT Visit Diagnosis: Other abnormalities of gait and mobility (R26.89)    Time: 7829-5621 PT Time Calculation (min) (ACUTE ONLY): 23 min   Charges:   PT Evaluation $PT Eval Moderate Complexity: 1 Mod PT Treatments $Therapeutic Activity: 8-22 mins PT General Charges $$ ACUTE PT VISIT: 1 Visit         Arletta Bale, DPT  Acute Rehabilitation Services Office [phone]   Alessandra Bevels Kensly Bowmer 10/16/2022, 10:35 AM

## 2022-10-16 NOTE — Progress Notes (Signed)
  PROGRESS NOTE    Steven Welch  ZHY:[id number] DOB: 1940-07-16 DOA: 10/13/2022 PCP: Dorothey Baseman, MD  [id number]  LOS: 2 days   Brief hospital course:   Assessment & Plan: Steven Welch is a 82 y.o. male with medical history significant for paroxysmal atrial fibrillation, sick sinus syndrome, hypertension, frequent falls, last seen by cardiology 08/2022 who is s/p left shoulder surgery who presents following a syncopal event at home. He had no preceding symptoms.   * Postural dizziness with presyncope Symptomatic bradycardia with history of sick sinus syndrome Patient's heart rate in the 30s to 40s while in the ED, asymptomatic Not currently on rate control agents Cardiology consulted   PAF (paroxysmal atrial fibrillation) (HCC) Continue Eliquis Not on any rate control agents   AKI (acute kidney injury) (HCC) Worsening, suspect secondary to poor p.o. intake --s/p MIVF   Hypokalemia --monitor and replete PRN   Status post reverse arthroplasty of shoulder, left Patient denies pain and had no injury from fall   Hypertension Avoid rate lowering agents with syncopal events, currently avoiding beta blockade/heart rate lowering antihypertensive medication --cont hydralazine 50 TID    DVT prophylaxis: [id number] Code Status: Full code  Family Communication:  Level of care: Med-Surg Dispo:   The patient is from: home Anticipated d/c is to: SNF Anticipated d/c date is: whenever bed available   Subjective and Interval History:  Pt reported his arm not working well.  No dyspnea.   Objective: Vitals:   10/16/22 0756 10/16/22 1227 10/16/22 1654 10/16/22 1654  BP: (!) 182/80 (!) 156/79 (!) 172/89 (!) 172/89  Pulse: (!) 59 66 85 89  Resp: 16 16 18 18   Temp: 97.9 F (36.6 C) 98.3 F (36.8 C) 98 F (36.7 C) 98 F (36.7 C)  TempSrc:      SpO2: 100% 100% 95% 96%    Intake/Output Summary (Last 24 hours) at 10/16/2022 2007 Last data filed at 10/16/2022 1919 Gross per  24 hour  Intake 953 ml  Output 1200 ml  Net -247 ml   There were no vitals filed for this visit.  Examination:   Constitutional: NAD, AAOx3 HEENT: conjunctivae and lids normal, EOMI CV: No cyanosis.   RESP: normal respiratory effort Extremities: No effusions, edema in BLE, both hands swollen. SKIN: warm, dry Neuro: II - XII grossly intact.     Data Reviewed: I have personally reviewed labs and imaging studies  Time spent: 35 minutes  Darlin Priestly, MD [facility] If 7PM-7AM, please contact night-coverage 10/16/2022, 8:07 PM

## 2022-10-16 NOTE — Evaluation (Signed)
Occupational Therapy Evaluation Patient Details Name: Steven Welch MRN: [id number] DOB: Mar 30, 1940 Today's Date: 10/16/2022   History of Present Illness Pt is an 82 y/o male admitted after sustaining a fall at home secondary to a syncopal episode. Pt with no injuries from the fall. Pt found to have intermittent recurrent vertigo. 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   Clinical Impression   Patient received for OT evaluation. See flowsheet below for details of function. Generally, patient requiring MOD A for bed mobility, MOD A for sit to stand with handheld assistance, very poor standing balance, and set up-MAX A for ADLs. Patient will benefit from continued OT while in acute care.       Recommendations for follow up therapy are one component of a multi-disciplinary discharge planning process, led by the attending physician.  Recommendations may be updated based on patient status, additional functional criteria and insurance authorization.   Assistance Recommended at Discharge Frequent or constant Supervision/Assistance  Patient can return home with the following A lot of help with bathing/dressing/bathroom;Two people to help with walking and/or transfers;Assistance with cooking/housework;Direct supervision/assist for medications management;Direct supervision/assist for financial management;Assist for transportation;Help with stairs or ramp for entrance    Functional Status Assessment  Patient has had a recent decline in their functional status and demonstrates the ability to make significant improvements in function in a reasonable and predictable amount of time.  Equipment Recommendations  Other (comment) (defer to next venue of care)    Recommendations for Other Services       Precautions / Restrictions Precautions Precautions: Fall Precaution Comments: recent L RSA  (6/27) Restrictions Weight Bearing Restrictions: Yes LUE Weight Bearing: Non weight bearing Other Position/Activity Restrictions: no orders in chart, but maintained NWB L UE due to recent L RSA; wife stating that he is not supposed to use L arm.      Mobility Bed Mobility Overal bed mobility: Needs Assistance Bed Mobility: Supine to Sit, Sit to Supine     Supine to sit: Mod assist, HOB elevated Sit to supine: Mod assist   General bed mobility comments: lots of cues, increased time    Transfers Overall transfer level: Needs assistance Equipment used: 1 person hand held assist Transfers: Sit to/from Stand Sit to Stand: From elevated surface, Mod assist           General transfer comment: Pt very unsteady in standing; did not appear safe to take sidesteps to reclienr chair next to bed at this time with only +1 assist; returned to sitting after approx 15 seconds standing; pt continued to report dizziness; no BP machine available.      Balance Overall balance assessment: Needs assistance, History of Falls Sitting-balance support: Feet supported Sitting balance-Leahy Scale: Fair Sitting balance - Comments: post lean and LOB's close supervision needed at all times.   Standing balance support: During functional activity, Single extremity supported Standing balance-Leahy Scale: Poor Standing balance comment: high falls risk                           ADL either performed or assessed with clinical judgement   ADL Overall ADL's : Needs assistance/impaired                                       General ADL Comments: unable  to attempt ADLs today at EOB due to pt posterior lean and pt reporting dizziness. 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), but pt would need back support for ADLs; anticipate significant assistance with LB dressing, bathing, toileting, UB dressing (due to recent shoulder surgery)     Vision Baseline  Vision/History: 1 Wears glasses Patient Visual Report: No change from baseline       Perception     Praxis      Pertinent Vitals/Pain Pain Assessment Pain Assessment: 0-10 Pain Score:  (unrated) Pain Location: R wrist where IV inserted Pain Descriptors / Indicators: Sore, Guarding Pain Intervention(s): Limited activity within patient's tolerance, Monitored during session     Hand Dominance Right   Extremity/Trunk Assessment Upper Extremity Assessment Upper Extremity Assessment: LUE deficits/detail;Generalized weakness LUE Deficits / Details: NWB LUE 2/2 recent shoulde surgery   Lower Extremity Assessment Lower Extremity Assessment: Generalized weakness       Communication Communication Communication: No difficulties   Cognition Arousal/Alertness: Awake/alert Behavior During Therapy: WFL for tasks assessed/performed Overall Cognitive Status: History of cognitive impairments - at baseline                                 General Comments: Pt is A+Ox4, however slow to respond to OT cues and having difficulty explaining how he's feeling. (For example, instead of reporting "I'm dizzy", he reports "I need to get back to the bed" and when OT asks how he's feeling, unable to provide details until OT asks a specific question such as "are you dizzy?") Wife providing PLOF information today.     General Comments  Pt on room air throughout session.    Exercises     Shoulder Instructions      Home Living Family/patient expects to be discharged to:: Private residence Living Arrangements: Spouse/significant other Available Help at Discharge: Family;Available PRN/intermittently Type of Home: House Home Access: Stairs to enter [employer] of Steps: 2 Entrance Stairs-Rails: None Home Layout: One level     Bathroom Shower/Tub: Chief Strategy Officer: Standard     Home Equipment: Cane - single Librarian, academic (2 wheels);Wheelchair -  power   Additional Comments: Wife states that HHOT/HHPT was ordered, but she did not want them to come, so she sent them away; now knows that she would've benefitted from that assistance. Pt uses incontinence briefs at baseline per wife.      Prior Functioning/Environment Prior Level of Function : Needs assist             Mobility Comments: Wife stating that pt using SPC and her assist holding onto him for mobility at home since d/c from rehab (unclear how long that was; wife stated a few days; TOC note states pt came to hospital same day of d/c from SNF). 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        OT Problem List: Decreased strength;Decreased range of motion;Decreased activity tolerance;Decreased knowledge of use of DME or AE;Decreased coordination;Decreased safety awareness;Decreased knowledge of precautions      OT Treatment/Interventions: Self-care/ADL training;Therapeutic exercise;Therapeutic activities;Patient/family education    OT Goals(Current goals can be found in the care plan section) Acute Rehab OT Goals Patient Stated Goal: Get better OT Goal Formulation: With patient/family Time For Goal Achievement: 10/30/22 Potential to Achieve Goals: Fair  ADL Goals Pt Will Perform Grooming: standing;with min guard assist Pt Will Perform Lower Body Dressing: with min assist;sitting/lateral leans Pt Will Transfer to Toilet: with supervision;ambulating;regular height toilet Pt Will Perform Toileting - Clothing Manipulation and hygiene: with supervision;sitting/lateral leans  OT Frequency: Min 1X/week    Co-evaluation              AM-PAC OT "6 Clicks" Daily Activity     Outcome Measure Help from another person eating meals?: None Help from another person taking care of personal grooming?: A Little Help from another person toileting, which includes  using toliet, bedpan, or urinal?: Total Help from another person bathing (including washing, rinsing, drying)?: A Lot Help from another person to put on and taking off regular upper body clothing?: A Lot Help from another person to put on and taking off regular lower body clothing?: Total 6 Click Score: 13   End of Session Nurse Communication: Mobility status  Activity Tolerance: Patient limited by fatigue;Other (comment) (limited by dizziness) Patient left: with call bell/phone within reach;in bed;with bed alarm set;with family/visitor present  OT Visit Diagnosis: Other abnormalities of gait and mobility (R26.89);Muscle weakness (generalized) (M62.81)                Time: 1610-9604 OT Time Calculation (min): 31 min Charges:  OT General Charges $OT Visit: 1 Visit OT Evaluation $OT Eval Moderate Complexity: 1 Mod OT Treatments $Therapeutic Activity: 8-22 mins  Linward Foster, MS, OTR/L  Alvester Morin 10/16/2022, 1:29 PM

## 2022-10-17 ENCOUNTER — Inpatient Hospital Stay: Payer: Medicare Other

## 2022-10-17 DIAGNOSIS — R42 Dizziness and giddiness: Secondary | ICD-10-CM | POA: Diagnosis not present

## 2022-10-17 DIAGNOSIS — R55 Syncope and collapse: Secondary | ICD-10-CM | POA: Diagnosis not present

## 2022-10-17 LAB — BASIC METABOLIC PANEL
Anion gap: 10 (ref 5–15)
BUN: 41 mg/dL — ABNORMAL HIGH (ref 8–23)
CO2: 25 mmol/L (ref 22–32)
Calcium: 8.6 mg/dL — ABNORMAL LOW (ref 8.9–10.3)
Chloride: 104 mmol/L (ref 98–111)
Creatinine, Ser: 3.18 mg/dL — ABNORMAL HIGH (ref 0.61–1.24)
GFR, Estimated: 19 mL/min — ABNORMAL LOW (ref >60)
Glucose, Bld: 105 mg/dL — ABNORMAL HIGH (ref 70–99)
Potassium: 3.7 mmol/L (ref 3.5–5.1)
Sodium: 139 mmol/L (ref 135–145)

## 2022-10-17 LAB — CBC
HCT: 39 % (ref 39.0–52.0)
Hemoglobin: 13.1 g/dL (ref 13.0–17.0)
MCH: 31 pg (ref 26.0–34.0)
MCHC: 33.6 g/dL (ref 30.0–36.0)
MCV: 92.4 fL (ref 80.0–100.0)
Platelets: 299 10*3/uL (ref 150–400)
RBC: 4.22 MIL/uL (ref 4.22–5.81)
RDW: 13.2 % (ref 11.5–15.5)
WBC: 8.2 10*3/uL (ref 4.0–10.5)
nRBC: 0 % (ref 0.0–0.2)

## 2022-10-17 LAB — MAGNESIUM: Magnesium: 2.3 mg/dL (ref 1.7–2.4)

## 2022-10-17 MED ORDER — POLYETHYLENE GLYCOL 3350 17 G PO PACK
34.0000 g | PACK | Freq: Two times a day (BID) | ORAL | Status: DC
  Administered 2022-10-17 – 2022-10-18 (×2): 34 g via ORAL
  Filled 2022-10-17 (×2): qty 2

## 2022-10-17 MED ORDER — ENSURE ENLIVE PO LIQD
237.0000 mL | Freq: Two times a day (BID) | ORAL | Status: DC
  Administered 2022-10-17 – 2022-10-21 (×7): 237 mL via ORAL

## 2022-10-17 MED ORDER — METOPROLOL SUCCINATE ER 25 MG PO TB24
12.5000 mg | ORAL_TABLET | Freq: Every day | ORAL | Status: DC
  Administered 2022-10-17 – 2022-10-21 (×4): 12.5 mg via ORAL
  Filled 2022-10-17 (×5): qty 1

## 2022-10-17 NOTE — Care Management Important Message (Signed)
Important Message  Patient Details  Name: Steven Welch MRN: [id number] Date of Birth: [birth year]   Medicare Important Message Given:  N/A - LOS <3 / Initial given by admissions     [name] 10/17/2022, 9:53 AM

## 2022-10-17 NOTE — Plan of Care (Signed)
  Problem: Education: Goal: Knowledge of condition and prescribed therapy will improve 10/17/2022 0754 by Mila Homer, RN Outcome: Progressing 10/17/2022 0753 by Mila Homer, RN Outcome: Progressing   Problem: Cardiac: Goal: Will achieve and/or maintain adequate cardiac output 10/17/2022 0754 by Mila Homer, RN Outcome: Progressing 10/17/2022 0753 by Mila Homer, RN Outcome: Progressing   Problem: Physical Regulation: Goal: Complications related to the disease process, condition or treatment will be avoided or minimized 10/17/2022 0754 by Mila Homer, RN Outcome: Progressing 10/17/2022 0753 by Mila Homer, RN Outcome: Progressing   Problem: Education: Goal: Knowledge of General Education information will improve Description: Including pain rating scale, medication(s)/side effects and non-pharmacologic comfort measures 10/17/2022 0754 by Mila Homer, RN Outcome: Progressing 10/17/2022 0753 by Mila Homer, RN Outcome: Progressing   Problem: Health Behavior/Discharge Planning: Goal: Ability to manage health-related needs will improve 10/17/2022 0754 by Mila Homer, RN Outcome: Progressing 10/17/2022 0753 by Mila Homer, RN Outcome: Progressing   Problem: Clinical Measurements: Goal: Ability to maintain clinical measurements within normal limits will improve 10/17/2022 0754 by Mila Homer, RN Outcome: Progressing 10/17/2022 0753 by Mila Homer, RN Outcome: Progressing Goal: Will remain free from infection 10/17/2022 0754 by Mila Homer, RN Outcome: Progressing 10/17/2022 0753 by Mila Homer, RN Outcome: Progressing Goal: Diagnostic test results will improve 10/17/2022 0754 by Mila Homer, RN Outcome: Progressing 10/17/2022 0753 by Mila Homer, RN Outcome: Progressing Goal: Respiratory complications will improve 10/17/2022 0754 by Mila Homer, RN Outcome: Progressing 10/17/2022 0753 by Mila Homer, RN Outcome:  Progressing Goal: Cardiovascular complication will be avoided 10/17/2022 0754 by Mila Homer, RN Outcome: Progressing 10/17/2022 0753 by Mila Homer, RN Outcome: Progressing   Problem: Activity: Goal: Risk for activity intolerance will decrease 10/17/2022 0754 by Mila Homer, RN Outcome: Progressing 10/17/2022 0753 by Mila Homer, RN Outcome: Progressing   Problem: Nutrition: Goal: Adequate nutrition will be maintained 10/17/2022 0754 by Mila Homer, RN Outcome: Progressing 10/17/2022 0753 by Mila Homer, RN Outcome: Progressing   Problem: Coping: Goal: Level of anxiety will decrease 10/17/2022 0754 by Mila Homer, RN Outcome: Progressing 10/17/2022 0753 by Mila Homer, RN Outcome: Progressing   Problem: Elimination: Goal: Will not experience complications related to bowel motility 10/17/2022 0754 by Mila Homer, RN Outcome: Progressing 10/17/2022 0753 by Mila Homer, RN Outcome: Progressing Goal: Will not experience complications related to urinary retention 10/17/2022 0754 by Mila Homer, RN Outcome: Progressing 10/17/2022 0753 by Mila Homer, RN Outcome: Progressing   Problem: Pain Managment: Goal: General experience of comfort will improve 10/17/2022 0754 by Mila Homer, RN Outcome: Progressing 10/17/2022 0753 by Mila Homer, RN Outcome: Progressing   Problem: Safety: Goal: Ability to remain free from injury will improve 10/17/2022 0754 by Mila Homer, RN Outcome: Progressing 10/17/2022 0753 by Mila Homer, RN Outcome: Progressing   Problem: Skin Integrity: Goal: Risk for impaired skin integrity will decrease 10/17/2022 0754 by Mila Homer, RN Outcome: Progressing 10/17/2022 0753 by Mila Homer, RN Outcome: Progressing

## 2022-10-17 NOTE — Progress Notes (Signed)
Patient refused lunch and dinner, he also does not feel good when sitting upright and states he feels better flat. Abdomen distended, distant bowel sounds. No flatus. Informed MD. Kub ordered.

## 2022-10-17 NOTE — TOC Progression Note (Signed)
Transition of Care [facility]) - Progression Note    Patient Details  Name: Steven Welch MRN: [id number] Date of Birth: April 21, 1940  [facility]) CM/SW Contact  Truddie Hidden, RN Phone Number: 10/17/2022, 4:08 PM  Clinical Narrative:    Attempt to contact patient's daughter per her request. No answer. Left a message.         Expected Discharge Plan and Services                                               Social Determinants of Health (SDOH) Interventions SDOH Screenings   Food Insecurity: No Food Insecurity (10/14/2022)  Housing: Low Risk  (10/14/2022)  Transportation Needs: No Transportation Needs (10/14/2022)  Utilities: Not At Risk (10/14/2022)  Financial Resource Strain: Patient Declined (02/22/2022)   Received from [facility], [facility]  Tobacco Use: Low Risk  (09/22/2022)    Readmission Risk Interventions     No data to display

## 2022-10-17 NOTE — TOC Progression Note (Signed)
Transition of Care [facility]) - Progression Note    Patient Details  Name: Steven Welch MRN: [id number] Date of Birth: March 13, 1941  [facility]) CM/SW Contact  Truddie Hidden, RN Phone Number: 10/17/2022, 12:00 PM  Clinical Narrative:    Message left for Tiffany at [facility] regarding update on status of referral sent.         Expected Discharge Plan and Services                                               Social Determinants of Health (SDOH) Interventions SDOH Screenings   Food Insecurity: No Food Insecurity (10/14/2022)  Housing: Low Risk  (10/14/2022)  Transportation Needs: No Transportation Needs (10/14/2022)  Utilities: Not At Risk (10/14/2022)  Financial Resource Strain: Patient Declined (02/22/2022)   Received from [facility], [facility]  Tobacco Use: Low Risk  (09/22/2022)    Readmission Risk Interventions     No data to display

## 2022-10-17 NOTE — Progress Notes (Signed)
Physical Therapy Treatment Patient Details Name: Shady Bradish MRN: [id number] DOB: 10-17-1940 Today's Date: 10/17/2022   History of Present Illness Pt is an 82 y/o male admitted after sustaining a fall at home secondary to a syncopal episode. Pt with no injuries from the fall. Pt found to have intermittent recurrent vertigo. 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    PT Comments  Pt is making limited progress towards goals. Pt confused and once seated at EOB, heavy post lean once seated at EOB, not safe to further perform mobility attempts. Rolling performed in bed and bed linens changed. Pt asking therapist to wipe his nose and give his drink. Makes no effort to use R UE to self feed. Educated to work on ROM of R UE. No sling on L UE. Also performed B SLR x 10 with supervision. Will continue to progress as able.    Assistance Recommended at Discharge Frequent or constant Supervision/Assistance  If plan is discharge home, recommend the following:  Can travel by private vehicle    A lot of help with walking and/or transfers;A lot of help with bathing/dressing/bathroom;Assistance with cooking/housework;Assist for transportation;Help with stairs or ramp for entrance   No  Equipment Recommendations   (TBD)    Recommendations for Other Services       Precautions / Restrictions Precautions Precautions: Fall Precaution Comments: recent L RSA (6/27) Restrictions Weight Bearing Restrictions: No Other Position/Activity Restrictions: no orders in chart, but maintained NWB L UE due to recent L RSA; wife stating that he is not supposed to use L arm.     Mobility  Bed Mobility Overal bed mobility: Needs Assistance Bed Mobility: Rolling, Supine to Sit Rolling: Min assist   Supine to sit: Max assist Sit to supine: Max assist   General bed mobility comments: heavy cues and takes increased time. Heavy  assist for B UE and trunkal elevation. Once seated, unable to maintain support at EOB with heavy post leaning. Returned back to bed. B rolling performed for bed change    Transfers                   General transfer comment: would need +2 for safe mobility. PUshed call bell, staff unavailable for assistance at this time. Returned to bed    Ambulation/Gait                   Stairs             Wheelchair Mobility     Tilt Bed    Modified Rankin (Stroke Patients Only)       Balance Overall balance assessment: Needs assistance, History of Falls Sitting-balance support: Feet supported Sitting balance-Leahy Scale: Zero                                      Cognition Arousal/Alertness: Awake/alert Behavior During Therapy: WFL for tasks assessed/performed Overall Cognitive Status: History of cognitive impairments - at baseline                                 General Comments: appears confused. Lacks safety awareness- i.e. asking to drink his water completely supine; wanting to lie in urine soaked bed despite education        Exercises Other Exercises Other  Exercises: pt soiled upon arrival and resistant to linen change. Able to roll and scoot towards EOB with min assist.    General Comments        Pertinent Vitals/Pain Pain Assessment Pain Assessment: No/denies pain    Home Living                          Prior Function            PT Goals (current goals can now be found in the care plan section) Acute Rehab PT Goals Patient Stated Goal: to get his health issues addressed PT Goal Formulation: With patient Time For Goal Achievement: 10/30/22 Potential to Achieve Goals: Fair Progress towards PT goals: Progressing toward goals    Frequency    Min 1X/week      PT Plan Current plan remains appropriate    Co-evaluation              AM-PAC PT "6 Clicks" Mobility   Outcome Measure  Help  needed turning from your back to your side while in a flat bed without using bedrails?: A Little Help needed moving from lying on your back to sitting on the side of a flat bed without using bedrails?: A Lot Help needed moving to and from a bed to a chair (including a wheelchair)?: Total Help needed standing up from a chair using your arms (e.g., wheelchair or bedside chair)?: Total Help needed to walk in hospital room?: Total Help needed climbing 3-5 steps with a railing? : Total 6 Click Score: 9    End of Session   Activity Tolerance: Patient tolerated treatment well Patient left: in bed;with call bell/phone within reach;with bed alarm set Nurse Communication: Mobility status PT Visit Diagnosis: Other abnormalities of gait and mobility (R26.89)     Time: 1455-1516 PT Time Calculation (min) (ACUTE ONLY): 21 min  Charges:    $Therapeutic Activity: 8-22 mins PT General Charges $$ ACUTE PT VISIT: 1 Visit                     Elizabeth Palau, PT, DPT, GCS [phone]    Zaidyn Claire 10/17/2022, 4:21 PM

## 2022-10-17 NOTE — Progress Notes (Signed)
  PROGRESS NOTE    Bryson Gavia  ZOX:[id number] DOB: 05-01-1940 DOA: 10/13/2022 PCP: Dorothey Baseman, MD  [id number]  LOS: 3 days   Brief hospital course:   Assessment & Plan: Connie Hilgert is a 82 y.o. male with medical history significant for paroxysmal atrial fibrillation, sick sinus syndrome, hypertension, frequent falls, last seen by cardiology 08/2022 who is s/p left shoulder surgery who presents following a syncopal event at home. He had no preceding symptoms.   * Postural dizziness with presyncope Symptomatic bradycardia Patient's heart rate in the 30s to 40s while in the ED.  2/2 metoprolol tartrate 50mg  BID, HR improved after holding. In NSR on tele.  --cardiology consulted --resume metop XL at 12.5 mg daily to prevent high HR with hx of parax AF, per cardio   PAF (paroxysmal atrial fibrillation) (HCC) Continue Eliquis --resume metop XL at 12.5 mg daily to prevent high HR with hx of parax AF, per cardio --cardio to arrange for follow up in clinic with Dr. Darrold Junker in 1-2 weeks.     AKI (acute kidney injury) (HCC) --Cr 3/23 on presentation.  Was 1.17 about 3 weeks ago.  Suspect secondary to poor p.o. intake, however, did not improve with IVF.  Bladder scan done showed no retention. --nephro consult today   Hypokalemia --monitor and replete PRN   Status post reverse arthroplasty of shoulder, left Patient denies pain and had no injury from fall   Hypertension Avoid rate lowering agents with syncopal events, currently avoiding beta blockade/heart rate lowering antihypertensive medication --cont hydralazine 50 TID --resume metop XL at 12.5 mg daily   Poor oral intake --order Ensure --bowel regimen for constipation --KUB    DVT prophylaxis: WJ:[id number] Code Status: Full code  Family Communication: wife updated at bedside today Level of care: Med-Surg Dispo:   The patient is from: home Anticipated d/c is to: SNF Anticipated d/c date is: whenever bed  available   Subjective and Interval History:  Pt reported pain in his right lateral thigh that's palpable.  Still reported not wanting to eat.  Has not had BM in several days.   Objective: Vitals:   10/17/22 0749 10/17/22 1149 10/17/22 1150 10/17/22 1703  BP: (!) 149/89 (!) 146/80 (!) 149/93 (!) 169/99  Pulse: 73 86 75 98  Resp: 18 19 18 19   Temp: 98.1 F (36.7 C) 98.6 F (37 C) 98.6 F (37 C) 98.7 F (37.1 C)  TempSrc:      SpO2: 97% 98% 97% 98%    Intake/Output Summary (Last 24 hours) at 10/17/2022 1816 Last data filed at 10/17/2022 0900 Gross per 24 hour  Intake 185.93 ml  Output 1100 ml  Net -914.07 ml   There were no vitals filed for this visit.  Examination:   Constitutional: NAD, AAOx3, slow responding HEENT: conjunctivae and lids normal, EOMI CV: No cyanosis.   RESP: normal respiratory effort, on RA SKIN: warm, dry Neuro: II - XII grossly intact.     Data Reviewed: I have personally reviewed labs and imaging studies  Time spent: 50 minutes  Darlin Priestly, MD [facility] If 7PM-7AM, please contact night-coverage 10/17/2022, 6:16 PM

## 2022-10-17 NOTE — Plan of Care (Signed)

## 2022-10-17 NOTE — Progress Notes (Signed)
[facility] CLINIC CARDIOLOGY CONSULT NOTE       Patient ID: Steven Welch MRN: [id number] DOB/AGE: [birth year] 82 y.o.  Admit date: 10/13/2022 Referring Physician Dr Lindajo Royal  Primary Physician Dr. Terance Hart Primary Cardiologist Gale Journey, PA-C / Dr. Darrold Junker  Reason for Consultation bradycardia  HPI: Steven Welch is an 82yoM with a PMH of paroxysmal AF (eliquis), HTN, s/p recent L shoulder reverse arthroplasty who presented to [facility] ED 10/13/2022 with dizziness and a possible syncopal event at home. In sinus bradycardia in 40s by admission EKG, HR normalized after holding home metoprolol tartrate.   Interval History:  - no complaints - laying flat in bed without dyspnea, chest pain, dizziness, or lightheadedness - in NSR with rate in 60s-70s on tele   Review of systems complete and found to be negative unless listed above     Past Medical History:  Diagnosis Date   Anxiety    Aortic atherosclerosis (HCC)    Asymptomatic bradycardia    Cyst of breast, right 1981   Gastric ulcer    GERD (gastroesophageal reflux disease)    Hepatitis 1988   History of shingles    Hyperlipidemia    Hypertension    Long term current use of anticoagulant    a.) apixaban   Osteoarthritis    PAF (paroxysmal atrial fibrillation) (HCC)    a.) CHA2DS2VASc = 4 (age x2, HTN, vascular disease history);  b.) rate/rhythm maintained on oral metoprolol tartrate; chronically anticoagulated with apixaban   RBBB (right bundle branch block)    Recurrent falls    Skin cancer of face    SSS (sick sinus syndrome) (HCC)     Past Surgical History:  Procedure Laterality Date   BREAST EXCISIONAL BIOPSY Right 1981   neg   COLON SURGERY     age 78 month old   REVERSE SHOULDER ARTHROPLASTY Left 09/22/2022   Procedure: REVERSE SHOULDER ARTHROPLASTY with BICEPS TENODESIS;  Surgeon: Christena Flake, MD;  Location: [facility] ORS;  Service: Orthopedics;  Laterality: Left;   TONSILLECTOMY     TOTAL HIP ARTHROPLASTY Left  02/27/2014   TOTAL HIP ARTHROPLASTY Right 01/01/2019   Procedure: TOTAL HIP ARTHROPLASTY ANTERIOR APPROACH;  Surgeon: Kennedy Bucker, MD;  Location: [facility] ORS;  Service: Orthopedics;  Laterality: Right;    Medications Prior to Admission  Medication Sig Dispense Refill Last Dose   acetaminophen (TYLENOL) 500 MG tablet Take 2 tablets (1,000 mg total) by mouth every 6 (six) hours as needed (pain). 60 tablet 0 prn at prn   apixaban (ELIQUIS) 5 MG TABS tablet Take 1 tablet (5 mg total) by mouth 2 (two) times daily. 60 tablet 6 10/13/2022 at 2000   hydrALAZINE (APRESOLINE) 25 MG tablet Take 1 tablet by mouth 3 (three) times daily.   10/13/2022   hydrochlorothiazide (HYDRODIURIL) 25 MG tablet Take 25 mg by mouth daily.   10/13/2022   melatonin 3 MG TABS tablet Take 6 mg by mouth at bedtime.   10/12/2022   Menthol, Topical Analgesic, (ICY HOT ORIGINAL PAIN RELIEF) 2.5 % GEL Apply topically as needed.   prn at unk   metoprolol tartrate (LOPRESSOR) 25 MG tablet Take 25 mg by mouth 2 (two) times daily.   10/13/2022   ondansetron (ZOFRAN) 4 MG tablet Take 1 tablet (4 mg total) by mouth every 6 (six) hours as needed for nausea. 30 tablet 0 prn at unk   lidocaine (LIDODERM) 5 % Place 1 patch onto the skin every 12 (twelve) hours. Remove & Discard patch within  12 hours or as directed by MD (Patient not taking: Reported on 10/14/2022) 10 patch 0 Not Taking   Social History   Socioeconomic History   Marital status: Married    Spouse name: Ki   Number of children: Not on file   Years of education: Not on file   Highest education level: Not on file  Occupational History   Not on file  Tobacco Use   Smoking status: Never   Smokeless tobacco: Never  Vaping Use   Vaping status: Never Used  Substance and Sexual Activity   Alcohol use: Not Currently   Drug use: Never   Sexual activity: Not on file  Other Topics Concern   Not on file  Social History Narrative   Not on file   Social Determinants of Health    Financial Resource Strain: Patient Declined (02/22/2022)   Received [facility], [facility]   Overall Financial Resource Strain (CARDIA)    Difficulty of Paying Living Expenses: Patient declined  Food Insecurity: No Food Insecurity (10/14/2022)   Hunger Vital Sign    Worried About Running Out of Food in the Last Year: Never true    Ran Out of Food in the Last Year: Never true  Transportation Needs: No Transportation Needs (10/14/2022)   PRAPARE - Administrator, Civil Service (Medical): No    Lack of Transportation (Non-Medical): No  Physical Activity: Not on file  Stress: Not on file  Social Connections: Not on file  Intimate Partner Violence: Not At Risk (10/14/2022)   Humiliation, Afraid, Rape, and Kick questionnaire    Fear of Current or Ex-Partner: No    Emotionally Abused: No    Physically Abused: No    Sexually Abused: No    Family History  Problem Relation Age of Onset   CAD Mother    Lung cancer Father       Intake/Output Summary (Last 24 hours) at 10/17/2022 1304 Last data filed at 10/17/2022 0900 Gross per 24 hour  Intake 1248.56 ml  Output 1100 ml  Net 148.56 ml    Vitals:   10/17/22 0331 10/17/22 0749 10/17/22 1149 10/17/22 1150  BP: (!) 157/74 (!) 149/89 (!) 146/80 (!) 149/93  Pulse: 79 73 86 75  Resp: (!) 22 18 19 18   Temp: 98.2 F (36.8 C) 98.1 F (36.7 C) 98.6 F (37 C) 98.6 F (37 C)  TempSrc: Oral     SpO2: 95% 97% 98% 97%    PHYSICAL EXAM General: elderly appearing male , well nourished, in no acute distress. Laying flat in bed. HEENT:  Normocephalic and atraumatic. Neck:  No JVD.  Lungs: Normal respiratory effort on room air. Clear bilaterally to auscultation. No wheezes, crackles, rhonchi.  Heart: HRRR . Normal S1 and S2 without gallops or murmurs.  Abdomen: Non-distended appearing.  Msk: Normal strength and tone for age. Steri strips on L anterior shoulder post recent  operation Extremities: Warm and well perfused. No clubbing, cyanosis. No peripheral edema.  Neuro: Alert and oriented X 3. Psych:  Answers questions appropriately.   Labs: Basic Metabolic Panel: Recent Labs    10/16/22 0409 10/17/22 0340  NA 139 139  K 3.3* 3.7  CL 105 104  CO2 25 25  GLUCOSE 115* 105*  BUN 44* 41*  CREATININE 2.97* 3.18*  CALCIUM 8.8* 8.6*  MG  --  2.3   Liver Function Tests: No results for input(s): "AST", "ALT", "ALKPHOS", "BILITOT", "PROT", "ALBUMIN" in the  last 72 hours. No results for input(s): "LIPASE", "AMYLASE" in the last 72 hours. CBC: Recent Labs    10/16/22 0409 10/17/22 0340  WBC 7.8 8.2  HGB 13.1 13.1  HCT 38.8* 39.0  MCV 92.6 92.4  PLT 308 299   Cardiac Enzymes: No results for input(s): "CKTOTAL", "CKMB", "CKMBINDEX", "TROPONINIHS" in the last 72 hours. BNP: No results for input(s): "BNP" in the last 72 hours. D-Dimer: No results for input(s): "DDIMER" in the last 72 hours. Hemoglobin A1C: No results for input(s): "HGBA1C" in the last 72 hours. Fasting Lipid Panel: No results for input(s): "CHOL", "HDL", "LDLCALC", "TRIG", "CHOLHDL", "LDLDIRECT" in the last 72 hours. Thyroid Function Tests: No results for input(s): "TSH", "T4TOTAL", "T3FREE", "THYROIDAB" in the last 72 hours.  Invalid input(s): "FREET3" Anemia Panel: No results for input(s): "VITAMINB12", "FOLATE", "FERRITIN", "TIBC", "IRON", "RETICCTPCT" in the last 72 hours.   Radiology: US RENAL  Result Date: 10/15/2022 CLINICAL DATA:  Acute renal insufficiency. EXAM: RENAL / URINARY TRACT ULTRASOUND COMPLETE COMPARISON:  None Available. FINDINGS: Evaluation is very limited due to body habitus and overlying bowel gas. Right Kidney: Renal measurements: 12.2 x 6.9 x 6.6 cm = volume: 291 mL. Normal echogenicity. There is mild right hydronephrosis. No shadowing stone. Left Kidney: Renal measurements: 11.0 x 6.0 x 6.3 cm = volume: 213 mL. Normal echogenicity. Mild left  hydronephrosis. No shadowing stone. Bladder: Appears normal for degree of bladder distention. Other: None. IMPRESSION: Mild bilateral hydronephrosis.  No shadowing stone. Electronically Signed   By: Elgie Collard M.D.   On: 10/15/2022 21:23   ECHOCARDIOGRAM COMPLETE  Result Date: 10/14/2022    ECHOCARDIOGRAM REPORT   Patient Name:   BRAXTYN BOJARSKI Date of Exam: 10/14/2022 Medical Rec #:  [id number]    Height:       70.0 in Accession #:    [id number]   Weight:       222.9 lb Date of Birth:  Sep 12, 1940     BSA:          2.186 m Patient Age:    82 years     BP:           Not listed in chart/Not listed in                                            chart mmHg Patient Gender: M            HR:           Not listed in chart bpm. Exam Location:  [facility] Procedure: 2D Echo, Cardiac Doppler and Color Doppler Indications:     Syncope R55  History:         Patient has prior history of Echocardiogram examinations, most                  recent 01/08/2021. Arrythmias:RBBB; Risk Factors:Hypertension                  and Dyslipidemia.  Sonographer:     Cristela Blue Referring Phys:  [id number] Andris Baumann Diagnosing Phys: Debbe Odea MD  Sonographer Comments: Technically challenging study due to limited acoustic windows, no apical window and no subcostal window. Pt left arm is in a sling--kept supine. IMPRESSIONS  1. Left ventricular ejection fraction, by estimation, is 60 to 65%. Left ventricular ejection fraction by PLAX is 60 %. The left ventricle  has normal function. The left ventricle has no regional wall motion abnormalities. There is mild left ventricular hypertrophy. Left ventricular diastolic function could not be evaluated.  2. Right ventricular systolic function is normal. The right ventricular size is not well visualized.  3. The mitral valve is normal in structure. No evidence of mitral valve regurgitation.  4. The aortic valve is grossly normal. Aortic valve regurgitation is not visualized. FINDINGS  Left Ventricle:  Left ventricular ejection fraction, by estimation, is 60 to 65%. Left ventricular ejection fraction by PLAX is 60 %. The left ventricle has normal function. The left ventricle has no regional wall motion abnormalities. The left ventricular internal cavity size was normal in size. There is mild left ventricular hypertrophy. Left ventricular diastolic function could not be evaluated. Right Ventricle: The right ventricular size is not well visualized. No increase in right ventricular wall thickness. Right ventricular systolic function is normal. Left Atrium: Left atrial size was normal in size. Right Atrium: Right atrial size was not well visualized. Pericardium: There is no evidence of pericardial effusion. Mitral Valve: The mitral valve is normal in structure. No evidence of mitral valve regurgitation. Tricuspid Valve: The tricuspid valve is not well visualized. Tricuspid valve regurgitation is not demonstrated. Aortic Valve: The aortic valve is grossly normal. Aortic valve regurgitation is not visualized. Pulmonic Valve: The pulmonic valve was not well visualized. Pulmonic valve regurgitation is not visualized. Aorta: The aortic root is normal in size and structure. Venous: The inferior vena cava was not well visualized. IAS/Shunts: No atrial level shunt detected by color flow Doppler.  LEFT VENTRICLE PLAX 2D LV EF:         Left ventricular ejection fraction by PLAX is 60 %. LVIDd:         3.80 cm LVIDs:         2.60 cm LV PW:         1.10 cm LV IVS:        1.10 cm LVOT diam:     2.10 cm LVOT Area:     3.46 cm  LEFT ATRIUM         Index LA diam:    2.40 cm 1.10 cm/m   AORTA Ao Root diam: 3.30 cm  SHUNTS Systemic Diam: 2.10 cm Debbe Odea MD Electronically signed by Debbe Odea MD Signature Date/Time: 10/14/2022/10:12:14 AM    Final    DG Chest Portable 1 View  Result Date: 10/13/2022 CLINICAL DATA:  Weakness EXAM: PORTABLE CHEST 1 VIEW COMPARISON:  Chest x-ray 08/18/2022 FINDINGS: The heart and  mediastinal contours are within normal limits. No focal consolidation. No pulmonary edema. No pleural effusion. No pneumothorax. No acute osseous abnormality. Partially visualize reversed left shoulder arthroplasty. Severe degenerative changes of the right glenohumeral joint. IMPRESSION: No active disease. Electronically Signed   By: Tish Frederickson M.D.   On: 10/13/2022 21:42   DG Shoulder Left Port  Result Date: 09/22/2022 CLINICAL DATA:  Status post reverse arthroplasty of left shoulder. EXAM: LEFT SHOULDER COMPARISON:  None. FINDINGS: Reverse left shoulder arthroplasty in expected alignment. No periprosthetic lucency or fracture. Recent postsurgical change includes air and edema in the soft tissues with skin staples in place. IMPRESSION: Reverse left shoulder arthroplasty without immediate postoperative complication. Electronically Signed   By: Narda Rutherford M.D.   On: 09/22/2022 16:09   [place] OR NERVE BLOCK-IMAGE ONLY [facility])  Result Date: 09/22/2022 There is no interpretation for this exam.  This order is for images obtained during a surgical procedure.  Please See "Surgeries" Tab for more information regarding the procedure.    ECHO 10/14/2022  1. Left ventricular ejection fraction, by estimation, is 60 to 65%. Left  ventricular ejection fraction by PLAX is 60 %. The left ventricle has  normal function. The left ventricle has no regional wall motion  abnormalities. There is mild left ventricular  hypertrophy. Left ventricular diastolic function could not be evaluated.   2. Right ventricular systolic function is normal. The right ventricular  size is not well visualized.   3. The mitral valve is normal in structure. No evidence of mitral valve  regurgitation.   4. The aortic valve is grossly normal. Aortic valve regurgitation is not  visualized.   TELEMETRY reviewed by me (LT) 10/17/2022 : NSR 60-80s  EKG reviewed by me: sinus brady RBBB rate 43  Data reviewed by me (LT) 10/17/2022: ed  ntoe, admission H&P, last 24h vitals tele labs imaging I/O    Principal Problem:   Postural dizziness with presyncope Active Problems:   Hypertension   Status post reverse arthroplasty of shoulder, left   PAF (paroxysmal atrial fibrillation) (HCC)   SSS (sick sinus syndrome) (HCC)   AKI (acute kidney injury) (HCC)   Symptomatic bradycardia   Hypokalemia   Insomnia    ASSESSMENT AND PLAN:  Quaron Delacruz is an 34yoM with a PMH of paroxysmal AF (eliquis), HTN, s/p recent L shoulder reverse arthroplasty who presented to [facility] ED 10/13/2022 with dizziness and a possible syncopal event at home. In sinus bradycardia in 40s by admission EKG, HR normalized after holding home metoprolol tartrate.   # symptomatic bradycardia # paroxysmal AF  2/2 metoprolol tartrate 50mg  BID, HR improved after holding. In NSR on tele. Will restart low dose metoprolol XL 12.5mg  daily to prevent high HR with hx parox AF. Eliquis 2.5mg  BID for stroke prevention.   Cardiology will sign off. Please haiku with questions or re-engage if needed.  Ok for discharge today from a cardiac perspective. Will arrange for follow up in clinic with Dr. Darrold Junker in 1-2 weeks.    This patient's plan of care was discussed and created with Dr. Juliann Pares and he is in agreement.  Signed: Rebeca Allegra , PA-C 10/17/2022, 1:04 PM [facility]

## 2022-10-18 DIAGNOSIS — R42 Dizziness and giddiness: Secondary | ICD-10-CM | POA: Diagnosis not present

## 2022-10-18 DIAGNOSIS — R55 Syncope and collapse: Secondary | ICD-10-CM | POA: Diagnosis not present

## 2022-10-18 LAB — CBC
HCT: 35.5 % — ABNORMAL LOW (ref 39.0–52.0)
Hemoglobin: 12.1 g/dL — ABNORMAL LOW (ref 13.0–17.0)
MCH: 31.3 pg (ref 26.0–34.0)
MCHC: 34.1 g/dL (ref 30.0–36.0)
MCV: 92 fL (ref 80.0–100.0)
Platelets: 241 10*3/uL (ref 150–400)
RBC: 3.86 MIL/uL — ABNORMAL LOW (ref 4.22–5.81)
RDW: 13.2 % (ref 11.5–15.5)
WBC: 6.9 10*3/uL (ref 4.0–10.5)
nRBC: 0 % (ref 0.0–0.2)

## 2022-10-18 LAB — URINALYSIS, COMPLETE (UACMP) WITH MICROSCOPIC
Bilirubin Urine: NEGATIVE
Glucose, UA: NEGATIVE mg/dL
Hgb urine dipstick: NEGATIVE
Ketones, ur: NEGATIVE mg/dL
Leukocytes,Ua: NEGATIVE
Nitrite: POSITIVE — AB
Protein, ur: NEGATIVE mg/dL
Specific Gravity, Urine: 1.008 (ref 1.005–1.030)
pH: 6 (ref 5.0–8.0)

## 2022-10-18 LAB — BASIC METABOLIC PANEL
Anion gap: 7 (ref 5–15)
BUN: 42 mg/dL — ABNORMAL HIGH (ref 8–23)
CO2: 26 mmol/L (ref 22–32)
Calcium: 8.4 mg/dL — ABNORMAL LOW (ref 8.9–10.3)
Chloride: 103 mmol/L (ref 98–111)
Creatinine, Ser: 3.28 mg/dL — ABNORMAL HIGH (ref 0.61–1.24)
GFR, Estimated: 18 mL/min — ABNORMAL LOW (ref >60)
Glucose, Bld: 100 mg/dL — ABNORMAL HIGH (ref 70–99)
Potassium: 3.5 mmol/L (ref 3.5–5.1)
Sodium: 136 mmol/L (ref 135–145)

## 2022-10-18 LAB — GLUCOSE, CAPILLARY: Glucose-Capillary: 98 mg/dL (ref 70–99)

## 2022-10-18 LAB — MAGNESIUM: Magnesium: 2.2 mg/dL (ref 1.7–2.4)

## 2022-10-18 MED ORDER — CEPHALEXIN 500 MG PO CAPS
500.0000 mg | ORAL_CAPSULE | Freq: Three times a day (TID) | ORAL | Status: DC
  Administered 2022-10-18 – 2022-10-21 (×10): 500 mg via ORAL
  Filled 2022-10-18 (×10): qty 1

## 2022-10-18 MED ORDER — POLYETHYLENE GLYCOL 3350 17 G PO PACK
17.0000 g | PACK | Freq: Two times a day (BID) | ORAL | Status: DC
  Administered 2022-10-18 – 2022-10-21 (×5): 17 g via ORAL
  Filled 2022-10-18 (×5): qty 1

## 2022-10-18 NOTE — Progress Notes (Signed)
PHARMACY NOTE:  ANTIMICROBIAL RENAL DOSAGE ADJUSTMENT  Current antimicrobial regimen includes a mismatch between antimicrobial dosage and estimated renal function.  As per policy approved by the Pharmacy & Therapeutics and Medical Executive Committees, the antimicrobial dosage will be adjusted accordingly.  Current antimicrobial dosage: Keflex 500 mg q6h  Indication: SSTI  Renal Function:  Estimated Creatinine Clearance: 21.1 mL/min (A) (by C-G formula based on SCr of 3.28 mg/dL (H)).    Antimicrobial dosage has been changed to:  Keflex 500 mg q8h  Thank you for allowing pharmacy to be a part of this patient's care.  Tressie Ellis, [place] 10/18/2022 1:49 PM

## 2022-10-18 NOTE — Progress Notes (Signed)
Pts r wrist swollen, red/warm. Scab noted. There was an IV here previously that was removed yesterday. Informed. MD

## 2022-10-18 NOTE — Progress Notes (Signed)
  PROGRESS NOTE    Steven Welch  ZOX:[id number] DOB: 03-Nov-1940 DOA: 10/13/2022 PCP: Dorothey Baseman, MD  210A/210A-AA  LOS: 4 days   Brief hospital course:   Assessment & Plan: Steven Welch is a 82 y.o. male with medical history significant for paroxysmal atrial fibrillation, sick sinus syndrome, hypertension, frequent falls, s/p recent left shoulder surgery who presented following a syncopal event at home. He had no preceding symptoms.   * Postural dizziness with presyncope Symptomatic bradycardia Patient's heart rate in the 30s to 40s while in the ED.  2/2 metoprolol tartrate 50mg  BID, HR improved after holding. In NSR on tele.  --cardiology consulted --cont Toprol 12.5 mg daily to prevent high HR with hx of parax AF, per cardio   PAF (paroxysmal atrial fibrillation) (HCC) Continue Eliquis --cont Toprol 12.5 mg daily to prevent high HR with hx of parax AF, per cardio --cardio to arrange for follow up in clinic with Dr. Darrold Junker in 1-2 weeks.     AKI (acute kidney injury) (HCC) --Cr 3.23 on presentation.  Was 1.17 about 3 weeks ago.  Suspect secondary to poor p.o. intake, however, did not improve with IVF.  Bladder scan done showed no retention. --nephro consult today   Hypokalemia --monitor and replete PRN   Status post reverse arthroplasty of shoulder, left Patient denies pain and had no injury from fall   Hypertension Avoid rate lowering agents with syncopal events, currently avoiding beta blockade/heart rate lowering antihypertensive medication --cont hydralazine 50 TID --cont Toprol 12.5 mg daily --hold HCTZ  Poor oral intake --KUB showed Moderate amount of gas and stool  --Ensure --scheduled Miralax  Cellulitis of right dorsal hand --first noted erythema and edema today around a previous IV site. --start Keflex today    DVT prophylaxis: [id number] Code Status: Full code  Family Communication:  Level of care: Med-Surg Dispo:   The patient is from:  home Anticipated d/c is to: SNF rehab Anticipated d/c date is: undetermined, AKI not improving, need to be cleared for discharge by nephro   Subjective and Interval History:  Pt said he ate breakfast this morning.   Objective: Vitals:   10/18/22 0500 10/18/22 0812 10/18/22 1100 10/18/22 1354  BP:  125/85 134/65 124/71  Pulse:  (!) 55 65 (!) 57  Resp:  20 20 18   Temp:  97.6 F (36.4 C) 97.8 F (36.6 C) 98 F (36.7 C)  TempSrc:   Oral Oral  SpO2:  99% 99% 100%  Weight: 101.9 kg       Intake/Output Summary (Last 24 hours) at 10/18/2022 1908 Last data filed at 10/18/2022 9562 Gross per 24 hour  Intake 103 ml  Output 350 ml  Net -247 ml   Filed Weights   10/18/22 0500  Weight: 101.9 kg    Examination:   Constitutional: NAD, alert, oriented, slow response HEENT: conjunctivae and lids normal, EOMI CV: No cyanosis.   RESP: normal respiratory effort, on RA Extremities: erythema and edema over right hand dorsum/wrist. SKIN: warm, dry   Data Reviewed: I have personally reviewed labs and imaging studies  Time spent: 50 minutes  Darlin Priestly, MD [facility] If 7PM-7AM, please contact night-coverage 10/18/2022, 7:08 PM

## 2022-10-18 NOTE — Consult Note (Signed)
[facility]  CONSULT NOTE    Date: 10/18/2022                  Patient Name:  Steven Welch  MRN: [id number]  DOB: [birth year]  Age / Sex: 82 y.o., male         PCP: Dorothey Baseman, MD                 Service Requesting Consult: TRH                 Reason for Consult: Acute kidney injury            History of Present Illness: [name] is a 82 y.o.  male with past medical conditions including sick sinus syndrome, hypertension, paroxysmal atrial fibrillation, frequent falls, who was admitted to [facility] on 10/13/2022 for Sinus bradycardia [R00.1] AKI (acute kidney injury) (HCC) [N17.9] Postural dizziness with presyncope [R42, R55]  Patient presents to the emergency department for near syncopal event.  Patient seen sitting up in chair.  Patient states he still not at home but was unable to ambulate.  Family contacted EMS for evaluation.  Patient denies nausea, vomiting, or diarrhea prior to admission.  Denies NSAID use.  On ED arrival, patient found to be bradycardic with elevated creatinine of 3.23.  Renal ultrasound shows bilateral mild hydronephrosis.  No obstruction.  Medications: Outpatient medications: Medications Prior to Admission  Medication Sig Dispense Refill Last Dose   acetaminophen (TYLENOL) 500 MG tablet Take 2 tablets (1,000 mg total) by mouth every 6 (six) hours as needed (pain). 60 tablet 0 prn at prn   apixaban (ELIQUIS) 5 MG TABS tablet Take 1 tablet (5 mg total) by mouth 2 (two) times daily. 60 tablet 6 10/13/2022 at 2000   hydrALAZINE (APRESOLINE) 25 MG tablet Take 1 tablet by mouth 3 (three) times daily.   10/13/2022   hydrochlorothiazide (HYDRODIURIL) 25 MG tablet Take 25 mg by mouth daily.   10/13/2022   melatonin 3 MG TABS tablet Take 6 mg by mouth at bedtime.   10/12/2022   Menthol, Topical Analgesic, (ICY HOT ORIGINAL PAIN RELIEF) 2.5 % GEL Apply topically as needed.   prn at unk   metoprolol tartrate (LOPRESSOR) 25 MG tablet Take 25  mg by mouth 2 (two) times daily.   10/13/2022   ondansetron (ZOFRAN) 4 MG tablet Take 1 tablet (4 mg total) by mouth every 6 (six) hours as needed for nausea. 30 tablet 0 prn at unk   lidocaine (LIDODERM) 5 % Place 1 patch onto the skin every 12 (twelve) hours. Remove & Discard patch within 12 hours or as directed by MD (Patient not taking: Reported on 10/14/2022) 10 patch 0 Not Taking    Current medications: Current Facility-Administered Medications  Medication Dose Route Frequency Provider Last Rate Last Admin   acetaminophen (TYLENOL) tablet 650 mg  650 mg Oral Q6H PRN Andris Baumann, MD   650 mg at 10/17/22 2112   Or   acetaminophen (TYLENOL) suppository 650 mg  650 mg Rectal Q6H PRN Andris Baumann, MD       apixaban Everlene Balls) tablet 2.5 mg  2.5 mg Oral BID Lindajo Royal V, MD   2.5 mg at 10/18/22 0920   atropine 1 MG/10ML injection 0.5 mg  0.5 mg Intravenous PRN Andris Baumann, MD       cephALEXin (KEFLEX) capsule 500 mg  500 mg Oral Q8H Darlin Priestly, MD   500 mg at 10/18/22  1407   feeding supplement (ENSURE ENLIVE / ENSURE PLUS) liquid 237 mL  237 mL Oral BID BM Darlin Priestly, MD   237 mL at 10/18/22 1410   hydrALAZINE (APRESOLINE) injection 10 mg  10 mg Intravenous Q6H PRN Darlin Priestly, MD   10 mg at 10/16/22 1216   hydrALAZINE (APRESOLINE) tablet 50 mg  50 mg Oral TID Darlin Priestly, MD   50 mg at 10/18/22 0920   metoprolol succinate (TOPROL-XL) 24 hr tablet 12.5 mg  12.5 mg Oral Daily Rebeca Allegra, PA-C   12.5 mg at 10/18/22 0920   ondansetron (ZOFRAN) tablet 4 mg  4 mg Oral Q6H PRN Andris Baumann, MD       Or   ondansetron [facility]) injection 4 mg  4 mg Intravenous Q6H PRN Andris Baumann, MD       oxyCODONE (Oxy IR/ROXICODONE) immediate release tablet 5 mg  5 mg Oral Q6H PRN Leeroy Bock, MD       polyethylene glycol (MIRALAX / GLYCOLAX) packet 34 g  34 g Oral BID Darlin Priestly, MD   34 g at 10/18/22 0920   sodium chloride flush (NS) 0.9 % injection 3 mL  3 mL Intravenous Q12H Andris Baumann, MD   3 mL at 10/18/22 1610      Allergies: No Known Allergies    Past Medical History: Past Medical History:  Diagnosis Date   Anxiety    Aortic atherosclerosis (HCC)    Asymptomatic bradycardia    Cyst of breast, right 1981   Gastric ulcer    GERD (gastroesophageal reflux disease)    Hepatitis 1988   History of shingles    Hyperlipidemia    Hypertension    Long term current use of anticoagulant    a.) apixaban   Osteoarthritis    PAF (paroxysmal atrial fibrillation) (HCC)    a.) CHA2DS2VASc = 4 (age x2, HTN, vascular disease history);  b.) rate/rhythm maintained on oral metoprolol tartrate; chronically anticoagulated with apixaban   RBBB (right bundle branch block)    Recurrent falls    Skin cancer of face    SSS (sick sinus syndrome) (HCC)      Past Surgical History: Past Surgical History:  Procedure Laterality Date   BREAST EXCISIONAL BIOPSY Right 1981   neg   COLON SURGERY     age 50 month old   REVERSE SHOULDER ARTHROPLASTY Left 09/22/2022   Procedure: REVERSE SHOULDER ARTHROPLASTY with BICEPS TENODESIS;  Surgeon: Christena Flake, MD;  Location: [facility] ORS;  Service: Orthopedics;  Laterality: Left;   TONSILLECTOMY     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   TOTAL HIP ARTHROPLASTY Right 01/01/2019   Procedure: TOTAL HIP ARTHROPLASTY ANTERIOR APPROACH;  Surgeon: Kennedy Bucker, MD;  Location: [facility] ORS;  Service: Orthopedics;  Laterality: Right;     Family History: Family History  Problem Relation Age of Onset   CAD Mother    Lung cancer Father      Social History: Social History   Socioeconomic History   Marital status: Married    Spouse name: Ki   Number of children: Not on file   Years of education: Not on file   Highest education level: Not on file  Occupational History   Not on file  Tobacco Use   Smoking status: Never   Smokeless tobacco: Never  Vaping Use   Vaping status: Never Used  Substance and Sexual Activity   Alcohol use: Not  Currently   Drug use:  Never   Sexual activity: Not on file  Other Topics Concern   Not on file  Social History Narrative   Not on file   Social Determinants of Health   Financial Resource Strain: Patient Declined (02/22/2022)   Received [facility], [facility]   Overall Financial Resource Strain (CARDIA)    Difficulty of Paying Living Expenses: Patient declined  Food Insecurity: No Food Insecurity (10/14/2022)   Hunger Vital Sign    Worried About Running Out of Food in the Last Year: Never true    Ran Out of Food in the Last Year: Never true  Transportation Needs: No Transportation Needs (10/14/2022)   PRAPARE - Administrator, Civil Service (Medical): No    Lack of Transportation (Non-Medical): No  Physical Activity: Not on file  Stress: Not on file  Social Connections: Not on file  Intimate Partner Violence: Not At Risk (10/14/2022)   Humiliation, Afraid, Rape, and Kick questionnaire    Fear of Current or Ex-Partner: No    Emotionally Abused: No    Physically Abused: No    Sexually Abused: No     Review of Systems: Review of Systems  Constitutional:  Negative for chills, fever and malaise/fatigue.  HENT:  Negative for congestion, sore throat and tinnitus.   Eyes:  Negative for blurred vision and redness.  Respiratory:  Negative for cough, shortness of breath and wheezing.   Cardiovascular:  Negative for chest pain, palpitations, claudication and leg swelling.  Gastrointestinal:  Negative for abdominal pain, blood in stool, diarrhea, nausea and vomiting.  Genitourinary:  Negative for flank pain, frequency and hematuria.  Musculoskeletal:  Negative for back pain, falls and myalgias.  Skin:  Negative for rash.  Neurological:  Positive for loss of consciousness. Negative for dizziness, weakness and headaches.  Endo/Heme/Allergies:  Does not bruise/bleed easily.  Psychiatric/Behavioral:  Negative for depression. The patient  is not nervous/anxious and does not have insomnia.     Vital Signs: Blood pressure 124/71, pulse (!) 57, temperature 98 F (36.7 C), resp. rate 18, weight 101.9 kg, SpO2 100%.  Weight trends: Filed Weights   10/18/22 0500  Weight: 101.9 kg    Physical Exam: General: NAD  Head: Normocephalic, atraumatic. Moist oral mucosal membranes  Eyes: Anicteric  Lungs:  Clear to auscultation  Heart: Regular rate and rhythm  Abdomen:  Soft, nontender  Extremities:  No peripheral edema.  Neurologic: Nonfocal, moving all four extremities  Skin: No lesions  Access: None     Lab results: Basic Metabolic Panel: Recent Labs  Lab 10/13/22 2109 10/14/22 0830 10/15/22 0348 10/16/22 0409 10/17/22 0340 10/18/22 0523  NA 136 140   < > 139 139 136  K 3.3* 3.1*   < > 3.3* 3.7 3.5  CL 98 105   < > 105 104 103  CO2 28 26   < > 25 25 26   GLUCOSE 113* 92   < > 115* 105* 100*  BUN 54* 47*   < > 44* 41* 42*  CREATININE 3.23* 2.70*   < > 2.97* 3.18* 3.28*  CALCIUM 8.7* 7.9*   < > 8.8* 8.6* 8.4*  MG 2.7* 2.4  --   --  2.3 2.2   < > = values in this interval not displayed.    Liver Function Tests: Recent Labs  Lab 10/13/22 2109  AST 15  ALT 13  ALKPHOS 65  BILITOT 0.5  PROT 7.5  ALBUMIN 3.5   No  results for input(s): "LIPASE", "AMYLASE" in the last 168 hours. No results for input(s): "AMMONIA" in the last 168 hours.  CBC: Recent Labs  Lab 10/13/22 2109 10/15/22 0348 10/16/22 0409 10/17/22 0340 10/18/22 0523  WBC 8.9 7.4 7.8 8.2 6.9  NEUTROABS 6.3  --   --   --   --   HGB 13.8 13.1 13.1 13.1 12.1*  HCT 42.0 39.8 38.8* 39.0 35.5*  MCV 95.0 94.3 92.6 92.4 92.0  PLT 366 322 308 299 241    Cardiac Enzymes: No results for input(s): "CKTOTAL", "CKMB", "CKMBINDEX", "TROPONINI" in the last 168 hours.  BNP: Invalid input(s): "POCBNP"  CBG: Recent Labs  Lab 10/14/22 0503 10/15/22 0518 10/16/22 0628 10/18/22 0424  GLUCAP 83 89 99 98    Microbiology: Results for orders  placed or performed during the hospital encounter of 09/09/22  Surgical pcr screen     Status: None   Collection Time: 09/09/22  2:42 PM   Specimen: Nasal Mucosa; Nasal Swab  Result Value Ref Range Status   MRSA, PCR NEGATIVE NEGATIVE Final   Staphylococcus aureus NEGATIVE NEGATIVE Final    Comment: (NOTE) The Xpert SA Assay (FDA approved for NASAL specimens in patients 37 years of age and older), is one component of a comprehensive surveillance program. It is not intended to diagnose infection nor to guide or monitor treatment. Performed at [facility], [address] [place], [place] [address]     Coagulation Studies: No results for input(s): "LABPROT", "INR" in the last 72 hours.  Urinalysis: Recent Labs    10/18/22 0940  COLORURINE YELLOW*  LABSPEC 1.008  PHURINE 6.0  GLUCOSEU NEGATIVE  HGBUR NEGATIVE  BILIRUBINUR NEGATIVE  KETONESUR NEGATIVE  PROTEINUR NEGATIVE  NITRITE POSITIVE*  LEUKOCYTESUR NEGATIVE      Imaging: DG Abd 1 View  Result Date: 10/17/2022 CLINICAL DATA:  Abdominal distention EXAM: ABDOMEN - 1 VIEW COMPARISON:  None Available. FINDINGS: Bowel gas pattern is nonspecific. A moderate amount of gas is present in colon. There is no significant small bowel dilation. Stomach is not distended. Left hemidiaphragm is elevated. There is previous arthroplasty in both hips. Degenerative changes are noted in lumbar spine. IMPRESSION: Nonspecific bowel gas pattern. Moderate amount of gas and stool are present in right colon. There is no fecal impaction in rectosigmoid. Electronically Signed   By: Ernie Avena M.D.   On: 10/17/2022 20:32     Assessment & Plan: Mr. Jamichael Knotts is a 82 y.o.  male with past medical conditions including sick sinus syndrome, hypertension, paroxysmal atrial fibrillation, frequent falls, who was admitted to [facility] on 10/13/2022 for Sinus bradycardia [R00.1] AKI (acute kidney injury) (HCC) [N17.9] Postural dizziness with  presyncope [R42, R55]  Acute kidney injury likely secondary to hypotension and bradycardia.  Baseline creatinine appears to be 1.17 on 09/23/2022.  Renal ultrasound shows a mild bilateral hydronephrosis without obstruction.  Patient noted to be bradycardic on admission.  No IV contrast exposure.  Creatinine on admission 3.23.  Creatinine initially improved however increasing once again.  Bladder scan negative for retention.  Creatinine 3.28 today.  Recommend avoiding hypotension.  No acute indication for dialysis.  2.  Hypertension, essential.  Home regimen includes hydrochlorothiazide, hydralazine, and metoprolol.  Hydrochlorothiazide held.  LOS: 4   7/23/20242:52 PM

## 2022-10-18 NOTE — Plan of Care (Signed)
  Problem: Cardiac: Goal: Will achieve and/or maintain adequate cardiac output Outcome: Progressing   Problem: Physical Regulation: Goal: Complications related to the disease process, condition or treatment will be avoided or minimized Outcome: Progressing   Problem: Education: Goal: Knowledge of General Education information will improve Description: Including pain rating scale, medication(s)/side effects and non-pharmacologic comfort measures Outcome: Progressing   Problem: Health Behavior/Discharge Planning: Goal: Ability to manage health-related needs will improve Outcome: Progressing   Problem: Clinical Measurements: Goal: Ability to maintain clinical measurements within normal limits will improve Outcome: Progressing Goal: Will remain free from infection Outcome: Progressing Goal: Diagnostic test results will improve Outcome: Progressing Goal: Respiratory complications will improve Outcome: Progressing Goal: Cardiovascular complication will be avoided Outcome: Progressing   Problem: Pain Managment: Goal: General experience of comfort will improve Outcome: Progressing

## 2022-10-19 ENCOUNTER — Inpatient Hospital Stay: Payer: Medicare Other

## 2022-10-19 DIAGNOSIS — R55 Syncope and collapse: Secondary | ICD-10-CM | POA: Diagnosis not present

## 2022-10-19 DIAGNOSIS — K922 Gastrointestinal hemorrhage, unspecified: Secondary | ICD-10-CM

## 2022-10-19 DIAGNOSIS — K625 Hemorrhage of anus and rectum: Secondary | ICD-10-CM

## 2022-10-19 DIAGNOSIS — R42 Dizziness and giddiness: Secondary | ICD-10-CM | POA: Diagnosis not present

## 2022-10-19 LAB — CBC
HCT: 36.8 % — ABNORMAL LOW (ref 39.0–52.0)
Hemoglobin: 12.5 g/dL — ABNORMAL LOW (ref 13.0–17.0)
MCH: 31.4 pg (ref 26.0–34.0)
MCHC: 34 g/dL (ref 30.0–36.0)
MCV: 92.5 fL (ref 80.0–100.0)
Platelets: 260 K/uL (ref 150–400)
RBC: 3.98 MIL/uL — ABNORMAL LOW (ref 4.22–5.81)
RDW: 13.2 % (ref 11.5–15.5)
WBC: 7.6 K/uL (ref 4.0–10.5)
nRBC: 0 % (ref 0.0–0.2)

## 2022-10-19 LAB — HEMOGLOBIN AND HEMATOCRIT, BLOOD
HCT: 38.1 % — ABNORMAL LOW (ref 39.0–52.0)
Hemoglobin: 12.6 g/dL — ABNORMAL LOW (ref 13.0–17.0)

## 2022-10-19 LAB — BASIC METABOLIC PANEL
Anion gap: 5 (ref 5–15)
BUN: 44 mg/dL — ABNORMAL HIGH (ref 8–23)
CO2: 26 mmol/L (ref 22–32)
Calcium: 8.2 mg/dL — ABNORMAL LOW (ref 8.9–10.3)
Chloride: 105 mmol/L (ref 98–111)
Creatinine, Ser: 3.28 mg/dL — ABNORMAL HIGH (ref 0.61–1.24)
GFR, Estimated: 18 mL/min — ABNORMAL LOW (ref >60)
Glucose, Bld: 102 mg/dL — ABNORMAL HIGH (ref 70–99)
Potassium: 3.6 mmol/L (ref 3.5–5.1)
Sodium: 136 mmol/L (ref 135–145)

## 2022-10-19 LAB — MAGNESIUM: Magnesium: 2.4 mg/dL (ref 1.7–2.4)

## 2022-10-19 MED ORDER — ENSURE ENLIVE PO LIQD
237.0000 mL | Freq: Two times a day (BID) | ORAL | Status: DC
  Administered 2022-10-20: 237 mL via ORAL

## 2022-10-19 NOTE — TOC Progression Note (Signed)
Transition of Care [facility]) - Progression Note    Patient Details  Name: Taseen Marasigan MRN: [id number] Date of Birth: 09/30/1940  Transition [facility]) CM/SW Contact  Truddie Hidden, RN Phone Number: 10/19/2022, 10:55 AM  Clinical Narrative:    Retrieved message from patient's daughter, Kenney Houseman daughter 7/22 @5 :01pm Returned call to [place]. Discharge plan discussed. Kenney Houseman is aware placement from  [facility] is for STR.  Patient's wife has previously appliled for [facility] and is pending approval. Kenney Houseman stated patient would have to spend down. Patient was previously at [facility] and used full coverage medicare days. She is aware a copayment from facility will be required. Patient wife initally requested for patient to return to [facility]. Per Kenney Houseman patient did not have favorable outcomes and would prefer another facility. RNCM gave current bed offers for [facility] and [facility]. Kenney Houseman chose [facility]. She was advised insurance Berkley Harvey is required.         Expected Discharge Plan and Services                                               Social Determinants of Health (SDOH) Interventions SDOH Screenings   Food Insecurity: No Food Insecurity (10/14/2022)  Housing: Low Risk  (10/14/2022)  Transportation Needs: No Transportation Needs (10/14/2022)  Utilities: Not At Risk (10/14/2022)  Financial Resource Strain: Patient Declined (02/22/2022)   Received from [facility], [facility]  Tobacco Use: Low Risk  (09/22/2022)    Readmission Risk Interventions     No data to display

## 2022-10-19 NOTE — TOC Progression Note (Signed)
Transition of Care [facility]) - Progression Note    Patient Details  Name: Steven Welch MRN: [id number] Date of Birth: [birth year]  Transition of Care [facility]) CM/SW Contact  [name], RN Phone Number: 10/19/2022, 1:27 PM  Clinical Narrative:      Spoke with daughter Kenney Houseman, she inquires what the copay per day would be per day, and if the facilities could potentially transition to LTC    - [facility] - is no longer able to offer a bed due to patient being in copay days - [facility] is no longer able to offer a bed - [facility] - unable to offer a bed - [facility] - If insurance approves copay would be $203 per day and would require 7 days up front.  If insurance denies rates would be $340 per day for private room, and $310 per day for semi private and would require 30 days upfront, then could potentially transition to LTC  Daughter Kenney Houseman updated. Her and patient's wife to discuss this evening and follow up with [facility] tomorrow about SNF vs home  Expected Discharge Plan and Services                                               Social Determinants of Health (SDOH) Interventions SDOH Screenings   Food Insecurity: No Food Insecurity (10/14/2022)  Housing: Low Risk  (10/14/2022)  Transportation Needs: No Transportation Needs (10/14/2022)  Utilities: Not At Risk (10/14/2022)  Financial Resource Strain: Patient Declined (02/22/2022)   Received from [facility], [facility]  Tobacco Use: Low Risk  (09/22/2022)    Readmission Risk Interventions     No data to display

## 2022-10-19 NOTE — Progress Notes (Signed)
Patient had a change in status, patient had a incontinent bm which had frank red blood. Patients vitals were taken and were stable, MD notified immediately and responded with interventions. Patients lower abdomen is notably firm and overall distended. Patient is a poor historian and I am unable to distinguish the baseline of his abdomen and if he has history of bloody stools.

## 2022-10-19 NOTE — Plan of Care (Signed)

## 2022-10-19 NOTE — Progress Notes (Signed)
PROGRESS NOTE    Steven Welch  OVF:[id number] DOB: 07-30-1940 DOA: 10/13/2022 PCP: Dorothey Baseman, MD   Assessment & Plan:   Principal Problem:   Postural dizziness with presyncope Active Problems:   Symptomatic bradycardia   SSS (sick sinus syndrome) (HCC)   PAF (paroxysmal atrial fibrillation) (HCC)   AKI (acute kidney injury) (HCC)   Hypertension   Status post reverse arthroplasty of shoulder, left   Hypokalemia   Insomnia  Assessment and Plan: Possible GI bleed: red blood w/ BM today. Holding eliquis. Takes advil daily as per pt. Never had EGD or colonoscopy as per pt. GI consulted. XR abd shows no acute abnormality noted. No contrast can be given currently secondary to AKI   Postural dizziness: with presyncope. Possibly secondary to bradycardia. Continue on reduced metoprolol dose as per cardio. Continue on tele    PAF: holding eliquis for possible GI bleed. Continue on reduced dose of metoprolol. Continue on tele. Follow up in clinic with Dr. Darrold Junker in 1-2 weeks.     AKI: Cr is still elevated but stable from day prior. Nephro following and recs apprec    Hypokalemia: WNL today    S/p reverse left arthroplasty of shoulder: continue w/ supportive care    HTN: continue on reduced dose of metoprolol. Continue on hydralazine. Holding HCTZ  Poor oral intake: continue on ensure. Encourage po intake    Cellulitis of right dorsal hand: continue on keflex       DVT prophylaxis: SCDs Code Status: full  Family Communication: Disposition Plan: PT/OT recs SNF  Level of care: Med-Surg  Status is: Inpatient Remains inpatient appropriate because: severity of illness   Consultants:  GI   Procedures:   Antimicrobials: keflex   Subjective: Pt c/o blood w/ BM today   Objective: Vitals:   10/18/22 1354 10/18/22 2129 10/19/22 0500 10/19/22 0557  BP: 124/71 (!) 140/86  137/73  Pulse: (!) 57 66  (!) 54  Resp: 18     Temp: 98 F (36.7 C) 98.9 F (37.2 C)   97.8 F (36.6 C)  TempSrc: Oral Oral  Oral  SpO2: 100% 97%  98%  Weight:   97.1 kg     Intake/Output Summary (Last 24 hours) at 10/19/2022 0727 Last data filed at 10/19/2022 0600 Gross per 24 hour  Intake 100 ml  Output 1100 ml  Net -1000 ml   Filed Weights   10/18/22 0500 10/19/22 0500  Weight: 101.9 kg 97.1 kg    Examination:  General exam: Appears calm and comfortable  Respiratory system: Clear to auscultation. Respiratory effort normal. Cardiovascular system: S1 & S2 +. No  rubs, gallops or clicks.  Gastrointestinal system: Abdomen is distended, soft and nontender. Normal bowel sounds heard. Central nervous system: Alert and awake Psychiatry: Judgement and insight appears at baseline. Flat mood and affect     Data Reviewed: I have personally reviewed following labs and imaging studies  CBC: Recent Labs  Lab 10/13/22 2109 10/15/22 0348 10/16/22 0409 10/17/22 0340 10/18/22 0523 10/19/22 0512  WBC 8.9 7.4 7.8 8.2 6.9 7.6  NEUTROABS 6.3  --   --   --   --   --   HGB 13.8 13.1 13.1 13.1 12.1* 12.5*  HCT 42.0 39.8 38.8* 39.0 35.5* 36.8*  MCV 95.0 94.3 92.6 92.4 92.0 92.5  PLT 366 322 308 299 241 260   Basic Metabolic Panel: Recent Labs  Lab 10/13/22 2109 10/14/22 0830 10/15/22 0348 10/16/22 0409 10/17/22 0340 10/18/22 0523 10/19/22 8416  NA 136 140 139 139 139 136 136  K 3.3* 3.1* 3.4* 3.3* 3.7 3.5 3.6  CL 98 105 103 105 104 103 105  CO2 28 26 28 25 25 26 26   GLUCOSE 113* 92 105* 115* 105* 100* 102*  BUN 54* 47* 49* 44* 41* 42* 44*  CREATININE 3.23* 2.70* 3.08* 2.97* 3.18* 3.28* 3.28*  CALCIUM 8.7* 7.9* 8.5* 8.8* 8.6* 8.4* 8.2*  MG 2.7* 2.4  --   --  2.3 2.2 2.4   GFR: Estimated Creatinine Clearance: 20.6 mL/min (A) (by C-G formula based on SCr of 3.28 mg/dL (H)). Liver Function Tests: Recent Labs  Lab 10/13/22 2109  AST 15  ALT 13  ALKPHOS 65  BILITOT 0.5  PROT 7.5  ALBUMIN 3.5   No results for input(s): "LIPASE", "AMYLASE" in the last 168  hours. No results for input(s): "AMMONIA" in the last 168 hours. Coagulation Profile: No results for input(s): "INR", "PROTIME" in the last 168 hours. Cardiac Enzymes: No results for input(s): "CKTOTAL", "CKMB", "CKMBINDEX", "TROPONINI" in the last 168 hours. BNP (last 3 results) No results for input(s): "PROBNP" in the last 8760 hours. HbA1C: No results for input(s): "HGBA1C" in the last 72 hours. CBG: Recent Labs  Lab 10/14/22 0503 10/15/22 0518 10/16/22 0628 10/18/22 0424  GLUCAP 83 89 99 98   Lipid Profile: No results for input(s): "CHOL", "HDL", "LDLCALC", "TRIG", "CHOLHDL", "LDLDIRECT" in the last 72 hours. Thyroid Function Tests: No results for input(s): "TSH", "T4TOTAL", "FREET4", "T3FREE", "THYROIDAB" in the last 72 hours. Anemia Panel: No results for input(s): "VITAMINB12", "FOLATE", "FERRITIN", "TIBC", "IRON", "RETICCTPCT" in the last 72 hours. Sepsis Labs: No results for input(s): "PROCALCITON", "LATICACIDVEN" in the last 168 hours.  No results found for this or any previous visit (from the past 240 hour(s)).       Radiology Studies: DG Abd 1 View  Result Date: 10/17/2022 CLINICAL DATA:  Abdominal distention EXAM: ABDOMEN - 1 VIEW COMPARISON:  None Available. FINDINGS: Bowel gas pattern is nonspecific. A moderate amount of gas is present in colon. There is no significant small bowel dilation. Stomach is not distended. Left hemidiaphragm is elevated. There is previous arthroplasty in both hips. Degenerative changes are noted in lumbar spine. IMPRESSION: Nonspecific bowel gas pattern. Moderate amount of gas and stool are present in right colon. There is no fecal impaction in rectosigmoid. Electronically Signed   By: Ernie Avena M.D.   On: 10/17/2022 20:32        Scheduled Meds:  apixaban  2.5 mg Oral BID   cephALEXin  500 mg Oral Q8H   feeding supplement  237 mL Oral BID BM   hydrALAZINE  50 mg Oral TID   metoprolol succinate  12.5 mg Oral Daily    polyethylene glycol  17 g Oral BID   sodium chloride flush  3 mL Intravenous Q12H   Continuous Infusions:   LOS: 5 days    Time spent: 35 mins     Charise Killian, MD [facility] Pager [phone]  If 7PM-7AM, please contact night-coverage [email or web address] 10/19/2022, 7:27 AM

## 2022-10-19 NOTE — Consult Note (Signed)
Arlyss Repress, MD [address]  [address]  [place], [place] [address]  Main: [phone]  Fax: [phone] Pager: [phone]   Consultation  Referring Provider:     No ref. provider found Primary Care Physician:  Dorothey Baseman, MD Primary Gastroenterologist: Gentry Fitz        Reason for Consultation: Rectal bleeding Date of Admission:  10/13/2022 Date of Consultation:  10/19/2022         HPI:   Steven Welch is a 82 y.o. male with history of paroxysmal A-fib, on Eliquis, sick sinus syndrome, hypertension, recent left shoulder surgery who is admitted on 7/18 secondary to syncopal episode at home.  Patient is being managed for symptomatic bradycardia, cardiology on board.  He is found to have AKI, nephrology is on board, workup ongoing.  He is currently being treated for cellulitis of right dorsum, started on Keflex yesterday.  GI is consulted due to frank red blood that was noticed by nurse this morning. His hemoglobin at baseline is 13.8, down to 12.6 today.  Eliquis is held today after the episode of rectal bleeding. Patient is not fully coherent to answer my questions in detail He is concerned about hiccups and burping  NSAIDs: Advil  Antiplts/Anticoagulants/Anti thrombotics: Eliquis for history of A-fib  GI Procedures: None  Past Medical History:  Diagnosis Date   Anxiety    Aortic atherosclerosis (HCC)    Asymptomatic bradycardia    Cyst of breast, right 1981   Gastric ulcer    GERD (gastroesophageal reflux disease)    Hepatitis 1988   History of shingles    Hyperlipidemia    Hypertension    Long term current use of anticoagulant    a.) apixaban   Osteoarthritis    PAF (paroxysmal atrial fibrillation) (HCC)    a.) CHA2DS2VASc = 4 (age x2, HTN, vascular disease history);  b.) rate/rhythm maintained on oral metoprolol tartrate; chronically anticoagulated with apixaban   RBBB (right bundle branch block)    Recurrent falls    Skin cancer of face    SSS  (sick sinus syndrome) (HCC)     Past Surgical History:  Procedure Laterality Date   BREAST EXCISIONAL BIOPSY Right 1981   neg   COLON SURGERY     age 3 month old   REVERSE SHOULDER ARTHROPLASTY Left 09/22/2022   Procedure: REVERSE SHOULDER ARTHROPLASTY with BICEPS TENODESIS;  Surgeon: Christena Flake, MD;  Location: [facility];  Service: Orthopedics;  Laterality: Left;   TONSILLECTOMY     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   TOTAL HIP ARTHROPLASTY Right 01/01/2019   Procedure: TOTAL HIP ARTHROPLASTY ANTERIOR APPROACH;  Surgeon: Kennedy Bucker, MD;  Location: [facility];  Service: Orthopedics;  Laterality: Right;     Current Facility-Administered Medications:    acetaminophen (TYLENOL) tablet 650 mg, 650 mg, Oral, Q6H PRN, 650 mg at 10/17/22 2112 **OR** acetaminophen (TYLENOL) suppository 650 mg, 650 mg, Rectal, Q6H PRN, Andris Baumann, MD   atropine 1 MG/10ML injection 0.5 mg, 0.5 mg, Intravenous, PRN, Andris Baumann, MD   cephALEXin (KEFLEX) capsule 500 mg, 500 mg, Oral, Q8H, Darlin Priestly, MD, 500 mg at 10/19/22 1653   feeding supplement (ENSURE ENLIVE / ENSURE PLUS) liquid 237 mL, 237 mL, Oral, BID BM, Darlin Priestly, MD, 237 mL at 10/19/22 1652   feeding supplement (ENSURE ENLIVE / ENSURE PLUS) liquid 237 mL, 237 mL, Oral, BID BM, Breeze, Shantelle, NP   hydrALAZINE (APRESOLINE) injection 10 mg, 10 mg, Intravenous, Q6H PRN,  Darlin Priestly, MD, 10 mg at 10/16/22 1216   hydrALAZINE (APRESOLINE) tablet 50 mg, 50 mg, Oral, TID, Darlin Priestly, MD, 50 mg at 10/19/22 1653   metoprolol succinate (TOPROL-XL) 24 hr tablet 12.5 mg, 12.5 mg, Oral, Daily, Tang, Lily Michelle, PA-C, 12.5 mg at 10/19/22 0958   ondansetron (ZOFRAN) tablet 4 mg, 4 mg, Oral, Q6H PRN **OR** ondansetron (ZOFRAN) injection 4 mg, 4 mg, Intravenous, Q6H PRN, Andris Baumann, MD   oxyCODONE (Oxy IR/ROXICODONE) immediate release tablet 5 mg, 5 mg, Oral, Q6H PRN, Leeroy Bock, MD   polyethylene glycol (MIRALAX / GLYCOLAX) packet 17 g, 17  g, Oral, BID, Darlin Priestly, MD, 17 g at 10/19/22 0959   sodium chloride flush (NS) 0.9 % injection 3 mL, 3 mL, Intravenous, Q12H, Andris Baumann, MD, 3 mL at 10/19/22 2536   Family History  Problem Relation Age of Onset   CAD Mother    Lung cancer Father      Social History   Tobacco Use   Smoking status: Never   Smokeless tobacco: Never  Vaping Use   Vaping status: Never Used  Substance Use Topics   Alcohol use: Not Currently   Drug use: Never    Allergies as of 10/13/2022   (No Known Allergies)    Review of Systems:    All systems reviewed and negative except where noted in HPI.   Physical Exam:  Vital signs in last 24 hours: Temp:  [97.8 F (36.6 C)-98.9 F (37.2 C)] 98.4 F (36.9 C) (07/24 1119) Pulse Rate:  [54-66] 61 (07/24 1119) Resp:  [16-18] 18 (07/24 1119) BP: (137-144)/(73-92) 144/92 (07/24 1119) SpO2:  [97 %-100 %] 100 % (07/24 1119) Weight:  [97.1 kg] 97.1 kg (07/24 0500) Last BM Date : 10/19/22 (please see notes abnormal) General:   Pleasant, cooperative in NAD Head:  Normocephalic and atraumatic. Eyes:   No icterus.   Conjunctiva pink. PERRLA. Ears:  Normal auditory acuity. Neck:  Supple; no masses or thyroidomegaly Lungs: Respirations even and unlabored. Lungs clear to auscultation bilaterally.   No wheezes, crackles, or rhonchi.  Heart:  Regular rate and rhythm;  Without murmur, clicks, rubs or gallops Abdomen:  Soft, nondistended, nontender. Normal bowel sounds. No appreciable masses or hepatomegaly.  No rebound or guarding.  Rectal:  Not performed. Msk:  Symmetrical without gross deformities.  Strength generalized weakness Extremities:  Without edema, cyanosis or clubbing. Neurologic:  Alert and oriented x2;  grossly normal neurologically. Skin:  Intact without significant lesions or rashes. Psych:  Alert and cooperative. Normal affect.  LAB RESULTS:    Latest Ref Rng & Units 10/19/2022   12:45 PM 10/19/2022    5:12 AM 10/18/2022    5:23 AM   CBC  WBC 4.0 - 10.5 K/uL  7.6  6.9   Hemoglobin 13.0 - 17.0 g/dL 64.4  03.4  74.2   Hematocrit 39.0 - 52.0 % 38.1  36.8  35.5   Platelets 150 - 400 K/uL  260  241     BMET    Latest Ref Rng & Units 10/19/2022    5:12 AM 10/18/2022    5:23 AM 10/17/2022    3:40 AM  BMP  Glucose 70 - 99 mg/dL 595  638  756   BUN 8 - 23 mg/dL 44  42  41   Creatinine 0.61 - 1.24 mg/dL 4.33  2.95  1.88   Sodium 135 - 145 mmol/L 136  136  139   Potassium 3.5 -  5.1 mmol/L 3.6  3.5  3.7   Chloride 98 - 111 mmol/L 105  103  104   CO2 22 - 32 mmol/L 26  26  25    Calcium 8.9 - 10.3 mg/dL 8.2  8.4  8.6     LFT    Latest Ref Rng & Units 10/13/2022    9:09 PM 08/17/2022    9:21 PM 04/03/2021    7:05 PM  Hepatic Function  Total Protein 6.5 - 8.1 g/dL 7.5  7.1  6.6   Albumin 3.5 - 5.0 g/dL 3.5  3.9  3.3   AST 15 - 41 U/L 15  23  18    ALT 0 - 44 U/L 13  24  10    Alk Phosphatase 38 - 126 U/L 65  50  52   Total Bilirubin 0.3 - 1.2 mg/dL 0.5  0.7  0.9      STUDIES: No results found.    Impression / Plan:   Rayn Enderson is a 82 y.o. male with history of paroxysmal A-fib on Eliquis, sick sinus syndrome, presented with syncope, new onset of AKI, symptomatic bradycardia.  GI is consulted for an episode of rectal bleeding witnessed by RN today  Rectal bleeding: Isolated episode Hemoglobin is overall stable within last 24 hours With his underlying cardiac history and workup for AKI, unless patient developed several episodes of rectal bleeding, I do not recommend any further workup at this time If patient develops further episodes of rectal bleeding with significant drop in hemoglobin, recommend tagged RBC scan as the next step Okay to hold Eliquis for next 24 hours and if no further episodes of rectal bleeding, Eliquis can be resumed  Symptomatic bradycardia was thought to be secondary to high-dose metoprolol.  This has been improved, currently on low-dose metoprolol, cardiology is on board Nephrology is on  board for management of AKI, workup in progress  Thank you for involving me in the care of this patient.      LOS: 5 days   Lannette Donath, MD  10/19/2022, 5:46 PM    Note: This dictation was prepared with Dragon dictation along with smaller phrase technology. Any transcriptional errors that result from this process are unintentional.

## 2022-10-19 NOTE — Progress Notes (Addendum)
[facility]  ROUNDING NOTE   Subjective:   Patient laying in bed Has hiccups Appetite poor, denies nausea or vomiting  Creatinine 3.28 Urine output 1.1L in proceeding 24 hours  Objective:  Vital signs in last 24 hours:  Temp:  [97.8 F (36.6 C)-98.9 F (37.2 C)] 97.8 F (36.6 C) (07/24 0906) Pulse Rate:  [54-66] 54 (07/24 0906) Resp:  [16-20] 16 (07/24 0906) BP: (124-144)/(65-86) 144/84 (07/24 0906) SpO2:  [97 %-100 %] 99 % (07/24 0906) Weight:  [97.1 kg] 97.1 kg (07/24 0500)  Weight change: -4.8 kg Filed Weights   10/18/22 0500 10/19/22 0500  Weight: 101.9 kg 97.1 kg    Intake/Output: I/O last 3 completed shifts: In: 103 [P.O.:100; I.V.:3] Out: 1100 [Urine:1100]   Intake/Output this shift:  No intake/output data recorded.  Physical Exam: General: NAD,   Head: Normocephalic, atraumatic. Moist oral mucosal membranes  Eyes: Anicteric  Lungs:  Clear to auscultation, normal effort  Heart: Regular rate and rhythm  Abdomen:  Soft, nontender, distended  Extremities:  No peripheral edema.  Neurologic: Alert and oriented, moving all four extremities  Skin: No lesions  Access: None     Basic Metabolic Panel: Recent Labs  Lab 10/13/22 2109 10/14/22 0830 10/15/22 0348 10/16/22 0409 10/17/22 0340 10/18/22 0523 10/19/22 0512  NA 136 140 139 139 139 136 136  K 3.3* 3.1* 3.4* 3.3* 3.7 3.5 3.6  CL 98 105 103 105 104 103 105  CO2 28 26 28 25 25 26 26   GLUCOSE 113* 92 105* 115* 105* 100* 102*  BUN 54* 47* 49* 44* 41* 42* 44*  CREATININE 3.23* 2.70* 3.08* 2.97* 3.18* 3.28* 3.28*  CALCIUM 8.7* 7.9* 8.5* 8.8* 8.6* 8.4* 8.2*  MG 2.7* 2.4  --   --  2.3 2.2 2.4    Liver Function Tests: Recent Labs  Lab 10/13/22 2109  AST 15  ALT 13  ALKPHOS 65  BILITOT 0.5  PROT 7.5  ALBUMIN 3.5   No results for input(s): "LIPASE", "AMYLASE" in the last 168 hours. No results for input(s): "AMMONIA" in the last 168 hours.  CBC: Recent Labs  Lab  10/13/22 2109 10/15/22 0348 10/16/22 0409 10/17/22 0340 10/18/22 0523 10/19/22 0512  WBC 8.9 7.4 7.8 8.2 6.9 7.6  NEUTROABS 6.3  --   --   --   --   --   HGB 13.8 13.1 13.1 13.1 12.1* 12.5*  HCT 42.0 39.8 38.8* 39.0 35.5* 36.8*  MCV 95.0 94.3 92.6 92.4 92.0 92.5  PLT 366 322 308 299 241 260    Cardiac Enzymes: No results for input(s): "CKTOTAL", "CKMB", "CKMBINDEX", "TROPONINI" in the last 168 hours.  BNP: Invalid input(s): "POCBNP"  CBG: Recent Labs  Lab 10/14/22 0503 10/15/22 0518 10/16/22 0628 10/18/22 0424  GLUCAP 83 89 99 98    Microbiology: Results for orders placed or performed during the hospital encounter of 09/09/22  Surgical pcr screen     Status: None   Collection Time: 09/09/22  2:42 PM   Specimen: Nasal Mucosa; Nasal Swab  Result Value Ref Range Status   MRSA, PCR NEGATIVE NEGATIVE Final   Staphylococcus aureus NEGATIVE NEGATIVE Final    Comment: (NOTE) The Xpert SA Assay (FDA approved for NASAL specimens in patients 31 years of age and older), is one component of a comprehensive surveillance program. It is not intended to diagnose infection nor to guide or monitor treatment. Performed at [facility], [address], [place], [place] [address]  Coagulation Studies: No results for input(s): "LABPROT", "INR" in the last 72 hours.  Urinalysis: Recent Labs    10/18/22 0940  COLORURINE YELLOW*  LABSPEC 1.008  PHURINE 6.0  GLUCOSEU NEGATIVE  HGBUR NEGATIVE  BILIRUBINUR NEGATIVE  KETONESUR NEGATIVE  PROTEINUR NEGATIVE  NITRITE POSITIVE*  LEUKOCYTESUR NEGATIVE      Imaging: DG Abd 1 View  Result Date: 10/17/2022 CLINICAL DATA:  Abdominal distention EXAM: ABDOMEN - 1 VIEW COMPARISON:  None Available. FINDINGS: Bowel gas pattern is nonspecific. A moderate amount of gas is present in colon. There is no significant small bowel dilation. Stomach is not distended. Left hemidiaphragm is elevated. There is previous arthroplasty in  both hips. Degenerative changes are noted in lumbar spine. IMPRESSION: Nonspecific bowel gas pattern. Moderate amount of gas and stool are present in right colon. There is no fecal impaction in rectosigmoid. Electronically Signed   By: Ernie Avena M.D.   On: 10/17/2022 20:32     Medications:     apixaban  2.5 mg Oral BID   cephALEXin  500 mg Oral Q8H   feeding supplement  237 mL Oral BID BM   hydrALAZINE  50 mg Oral TID   metoprolol succinate  12.5 mg Oral Daily   polyethylene glycol  17 g Oral BID   sodium chloride flush  3 mL Intravenous Q12H   acetaminophen **OR** acetaminophen, atropine, hydrALAZINE, ondansetron **OR** ondansetron (ZOFRAN) IV, oxyCODONE  Assessment/ Plan:  Steven Welch is a 82 y.o.  male with past medical conditions including sick sinus syndrome, hypertension, paroxysmal atrial fibrillation, frequent falls, who was admitted to [facility] on 10/13/2022 for Sinus bradycardia [R00.1] AKI (acute kidney injury) (HCC) [N17.9] Postural dizziness with presyncope [R42, R55]   Acute kidney injury likely secondary to hypotension and bradycardia. Baseline creatinine appears to be 1.17 on 09/23/2022. Renal ultrasound shows a mild bilateral hydronephrosis without obstruction. Differential diagnosis of glomerulonephritis less likely due to negative UA. Creatinine on admission 3.23.  - Creatinine stable today. Decent urine output noted.  -Will consider additional serologic workup Lab Results  Component Value Date   CREATININE 3.28 (H) 10/19/2022   CREATININE 3.28 (H) 10/18/2022   CREATININE 3.18 (H) 10/17/2022    Intake/Output Summary (Last 24 hours) at 10/19/2022 1040 Last data filed at 10/19/2022 0600 Gross per 24 hour  Intake 0 ml  Output 750 ml  Net -750 ml   2.  Hypertension, essential.  Home regimen includes hydrochlorothiazide, hydralazine, and metoprolol.  Hydrochlorothiazide held.   - Blood pressure stable   LOS: 5   7/24/202410:40 AM

## 2022-10-19 NOTE — Progress Notes (Addendum)
Physical Therapy Treatment Patient Details Name: Steven Welch MRN: [id number] DOB: [birth year] Today's Date: 10/19/2022   History of Present Illness Pt is an 82 y/o male admitted after sustaining a fall at home secondary to a syncopal episode. Pt with no injuries from the fall. Pt found to have intermittent recurrent vertigo. 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    PT Comments  Pt presented laying bed, A&Ox3, not oriented to situation. Orthostatics performed from supine>sit due to complaints of dizziness, but were unremarkable. Pt required maxAx2 for supine<>sit, limited UE assist due to recent shoulder surgery. Pt able to perform sit<>stand with modAx2, single UE assist on RW, and requiring rocking to stand up. Pt would benefit from continued skilled therapy to address activity intolerance and maximize functional abilities.   Current WB precautions for L Shoulder not noted in chart, MD contacted and confirmed NWB in LUE with continued sling use.     Assistance Recommended at Discharge Frequent or constant Supervision/Assistance  If plan is discharge home, recommend the following:  Can travel by private vehicle    Assistance with cooking/housework;Assist for transportation;Help with stairs or ramp for entrance;Two people to help with walking and/or transfers;Two people to help with bathing/dressing/bathroom;Direct supervision/assist for medications management;Direct supervision/assist for financial management   No  Equipment Recommendations       Recommendations for Other Services       Precautions / Restrictions Precautions Precautions: Fall Shoulder Interventions: Shoulder sling/immobilizer Precaution Booklet Issued: No Precaution Comments: recent L RSA (6/27), shoulder sling currently at home. PT contacted wife to bring next time she visits Restrictions Weight Bearing Restrictions:  Yes LUE Weight Bearing: Non weight bearing Other Position/Activity Restrictions: Ortho contacted: continue sling use, "He should remain nonweightbearing through the left upper extremity or hand.  He may continue to progress with his passive range of motion exercises and may also begin some gentle isometric external rotation, forward flexion, and extension exercises.  At the 6-week mark, he may begin active assisted range of motion exercises and more aggressive strengthening exercises."     Mobility  Bed Mobility Overal bed mobility: Needs Assistance Bed Mobility: Supine to Sit     Supine to sit: Max assist, +2 for physical assistance Sit to supine: Max assist, +2 for physical assistance   General bed mobility comments: Pt limited contribution due to WB restrictions of L shoulder. Required repeated cueing to maintain NWB    Transfers Overall transfer level: Needs assistance Equipment used: Rolling walker (2 wheels) Transfers: Sit to/from Stand Sit to Stand: Mod assist, +2 physical assistance, From elevated surface                Ambulation/Gait                   Stairs             Wheelchair Mobility     Tilt Bed    Modified Rankin (Stroke Patients Only)       Balance Overall balance assessment: History of Falls, Needs assistance Sitting-balance support: Feet supported, Single extremity supported Sitting balance-Leahy Scale: Poor Sitting balance - Comments: Pt required therapist assist to initially maintain upright balance, but able to maintain over time and perform LAQ   Standing balance support: Single extremity supported, Reliant on assistive device for balance Standing balance-Leahy Scale: Poor  Cognition Arousal/Alertness: Awake/alert Behavior During Therapy: WFL for tasks assessed/performed Overall Cognitive Status: No family/caregiver present to determine baseline cognitive functioning Area of  Impairment: Orientation, Safety/judgement, Memory                 Orientation Level: Disoriented to, Situation   Memory: Decreased short-term memory, Decreased recall of precautions   Safety/Judgement: Decreased awareness of deficits     General Comments: Pt unable to recall shoulder surgery and precautions/sling.        Exercises General Exercises - Lower Extremity Long Arc Quad: AROM, Both, 5 reps, Seated    General Comments        Pertinent Vitals/Pain Pain Assessment Pain Assessment: Faces Faces Pain Scale: Hurts a little bit Pain Location: back pain with sitting Pain Descriptors / Indicators: Discomfort, Grimacing Pain Intervention(s): Monitored during session    Home Living                          Prior Function            PT Goals (current goals can now be found in the care plan section) Acute Rehab PT Goals Patient Stated Goal: to get his health issues addressed PT Goal Formulation: With patient Time For Goal Achievement: 10/30/22 Potential to Achieve Goals: Fair Progress towards PT goals: Progressing toward goals    Frequency    Min 1X/week      PT Plan Current plan remains appropriate    Co-evaluation              AM-PAC PT "6 Clicks" Mobility   Outcome Measure  Help needed turning from your back to your side while in a flat bed without using bedrails?: A Little Help needed moving from lying on your back to sitting on the side of a flat bed without using bedrails?: A Lot Help needed moving to and from a bed to a chair (including a wheelchair)?: Total Help needed standing up from a chair using your arms (e.g., wheelchair or bedside chair)?: Total Help needed to walk in hospital room?: Total Help needed climbing 3-5 steps with a railing? : Total 6 Click Score: 9    End of Session Equipment Utilized During Treatment: Gait belt Activity Tolerance: Patient tolerated treatment well Patient left: in bed;with call  bell/phone within reach;with bed alarm set   PT Visit Diagnosis: Other abnormalities of gait and mobility (R26.89) Pain - Right/Left: Left Pain - part of body: Shoulder     Time: 4098-1191 PT Time Calculation (min) (ACUTE ONLY): 23 min  Charges:    $Therapeutic Activity: 23-37 mins PT General Charges $$ ACUTE PT VISIT: 1 Visit                    Justen Fonda, PT, SPT 3:17 PM,10/19/22

## 2022-10-19 NOTE — Progress Notes (Signed)
Patient was not mobilized today due to active rectal bleeding which caused a slight drop in his hemoglobin. MD notified and assessed patient and consulted GI which also assessed the patient. Patient was turned and off loaded for skin breakdown prevention.

## 2022-10-20 ENCOUNTER — Encounter: Payer: Self-pay | Admitting: Internal Medicine

## 2022-10-20 DIAGNOSIS — R42 Dizziness and giddiness: Secondary | ICD-10-CM | POA: Diagnosis not present

## 2022-10-20 DIAGNOSIS — K625 Hemorrhage of anus and rectum: Secondary | ICD-10-CM | POA: Diagnosis not present

## 2022-10-20 DIAGNOSIS — I480 Paroxysmal atrial fibrillation: Secondary | ICD-10-CM | POA: Diagnosis not present

## 2022-10-20 DIAGNOSIS — R55 Syncope and collapse: Secondary | ICD-10-CM | POA: Diagnosis not present

## 2022-10-20 LAB — BASIC METABOLIC PANEL
Anion gap: 13 (ref 5–15)
BUN: 49 mg/dL — ABNORMAL HIGH (ref 8–23)
CO2: 24 mmol/L (ref 22–32)
Calcium: 8.8 mg/dL — ABNORMAL LOW (ref 8.9–10.3)
Creatinine, Ser: 3.29 mg/dL — ABNORMAL HIGH (ref 0.61–1.24)
GFR, Estimated: 18 mL/min — ABNORMAL LOW (ref >60)
Glucose, Bld: 97 mg/dL (ref 70–99)
Potassium: 3.5 mmol/L (ref 3.5–5.1)
Sodium: 138 mmol/L (ref 135–145)

## 2022-10-20 LAB — CBC
HCT: 40.7 % (ref 39.0–52.0)
Hemoglobin: 13.5 g/dL (ref 13.0–17.0)
MCH: 30.9 pg (ref 26.0–34.0)
MCHC: 33.2 g/dL (ref 30.0–36.0)
MCV: 93.1 fL (ref 80.0–100.0)
RBC: 4.37 MIL/uL (ref 4.22–5.81)
RDW: 13 % (ref 11.5–15.5)
WBC: 6.7 10*3/uL (ref 4.0–10.5)

## 2022-10-20 LAB — GLUCOSE, CAPILLARY
Glucose-Capillary: 71 mg/dL (ref 70–99)
Glucose-Capillary: 82 mg/dL (ref 70–99)
Glucose-Capillary: 136 mg/dL — ABNORMAL HIGH (ref 70–99)

## 2022-10-20 LAB — MAGNESIUM: Magnesium: 2.6 mg/dL — ABNORMAL HIGH (ref 1.7–2.4)

## 2022-10-20 MED ORDER — CHLORPROMAZINE HCL 25 MG PO TABS
25.0000 mg | ORAL_TABLET | Freq: Three times a day (TID) | ORAL | Status: AC
  Administered 2022-10-20 (×2): 25 mg via ORAL
  Filled 2022-10-20 (×3): qty 1

## 2022-10-20 NOTE — Progress Notes (Signed)
Occupational Therapy Treatment Patient Details Name: Steven Welch MRN: [id number] DOB: 1941/02/20 Today's Date: 10/20/2022   History of present illness Pt is an 82 y/o male admitted after sustaining a fall at home secondary to a syncopal episode. Pt with no injuries from the fall. Pt found to have intermittent recurrent vertigo. 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   OT comments  Pt seen for OT tx and co-tx with PT to optimize safe ADL mobility attempts. Pt agreeable, follows commands with PRN VC. Pt required MOD A +2 for bed mobility, MIN-MOD A x2 for STS transfers with PRN VC for NWBing for LUE. Sling adjusted for improved fit and comfort after pt requiring MAX A for donning/doffing clothing. Additional instruction in NWBing and sling positioning with OT adjusting for improved fit/comfort. Pt verbalized understanding. Continues to benefit from skilled OT Services.    Recommendations for follow up therapy are one component of a multi-disciplinary discharge planning process, led by the attending physician.  Recommendations may be updated based on patient status, additional functional criteria and insurance authorization.    Assistance Recommended at Discharge Frequent or constant Supervision/Assistance  Patient can return home with the following  A lot of help with bathing/dressing/bathroom;Two people to help with walking and/or transfers;Assistance with cooking/housework;Direct supervision/assist for medications management;Direct supervision/assist for financial management;Assist for transportation;Help with stairs or ramp for entrance   Equipment Recommendations  Other (comment) (defer to next venue)    Recommendations for Other Services      Precautions / Restrictions Precautions Precautions: Fall Shoulder Interventions: Shoulder sling/immobilizer Precaution Booklet Issued: No Precaution  Comments: Pt wearing shoulder sling today Restrictions Weight Bearing Restrictions: Yes LUE Weight Bearing: Non weight bearing Other Position/Activity Restrictions: Ortho contacted yesterday on NWB orders, defer to previous note       Mobility Bed Mobility Overal bed mobility: Needs Assistance Bed Mobility: Supine to Sit     Supine to sit: +2 for physical assistance, Mod assist     General bed mobility comments: Required min->modAx2 for trunk stability and task completion. Unable to use LUE due to precautions. Unable to scoot to EOB to place feet flat on floor    Transfers Overall transfer level: Needs assistance Equipment used: Rolling walker (2 wheels) Transfers: Sit to/from Stand Sit to Stand: Mod assist, +2 physical assistance, From elevated surface, Min assist           General transfer comment: Initially required modAx2 for sit<> stand transfers from elevated bed, later able to lateral step to recliner and perform sit<>stand minAx2     Balance Overall balance assessment: History of Falls, Needs assistance Sitting-balance support: Feet unsupported, Single extremity supported Sitting balance-Leahy Scale: Poor Sitting balance - Comments: Pt able to maintain upright posturing intially, but experienced posterior lean after a few minutes Postural control: Posterior lean Standing balance support: Single extremity supported, Reliant on assistive device for balance Standing balance-Leahy Scale: Poor Standing balance comment: high falls risk                           ADL either performed or assessed with clinical judgement   ADL Overall ADL's : Needs assistance/impaired                 Upper Body Dressing : Sitting;Maximal assistance Upper Body Dressing Details (indicate cue type and reason): sling adjusted for improved fit and comfort after requiring MAX A  for donning/doffing clothing     Toilet Transfer: Engineer, petroleum Details  (indicate cue type and reason): handheld assist with CGA                Extremity/Trunk Assessment              Vision       Perception     Praxis      Cognition Arousal/Alertness: Awake/alert Behavior During Therapy: WFL for tasks assessed/performed Overall Cognitive Status: No family/caregiver present to determine baseline cognitive functioning Area of Impairment: Memory, Safety/judgement, Problem solving                     Memory: Decreased short-term memory, Decreased recall of precautions   Safety/Judgement: Decreased awareness of deficits, Decreased awareness of safety   Problem Solving: Requires verbal cues, Requires tactile cues, Slow processing General Comments: pt unable to recall recent surgery and unaware why he was not supposed to use hi LUE        Exercises Other Exercises Other Exercises: OT adjusted sling with edu in improved fit and comfort with optimal positioning, education on NWBing and no shoulder ROM    Shoulder Instructions       General Comments      Pertinent Vitals/ Pain       Pain Assessment Pain Assessment: No/denies pain  Home Living                                          Prior Functioning/Environment              Frequency  Min 1X/week        Progress Toward Goals  OT Goals(current goals can now be found in the care plan section)  Progress towards OT goals: Progressing toward goals  Acute Rehab OT Goals Patient Stated Goal: get better OT Goal Formulation: With patient/family Time For Goal Achievement: 10/30/22 Potential to Achieve Goals: Fair  Plan Discharge plan remains appropriate;Frequency remains appropriate    Co-evaluation    PT/OT/SLP Co-Evaluation/Treatment: Yes Reason for Co-Treatment: For patient/therapist safety;To address functional/ADL transfers PT goals addressed during session: Mobility/safety with mobility;Proper use of DME OT goals addressed during  session: ADL's and self-care      AM-PAC OT "6 Clicks" Daily Activity     Outcome Measure   Help from another person eating meals?: None Help from another person taking care of personal grooming?: A Little Help from another person toileting, which includes using toliet, bedpan, or urinal?: Total Help from another person bathing (including washing, rinsing, drying)?: A Lot Help from another person to put on and taking off regular upper body clothing?: A Lot Help from another person to put on and taking off regular lower body clothing?: A Lot 6 Click Score: 14    End of Session Equipment Utilized During Treatment: Gait belt  OT Visit Diagnosis: Other abnormalities of gait and mobility (R26.89);Muscle weakness (generalized) (M62.81)   Activity Tolerance Patient tolerated treatment well   Patient Left in chair;with call bell/phone within reach;with chair alarm set;with restraints reapplied (lap belt)   Nurse Communication Mobility status (lap belt)        Time: 1610-9604 OT Time Calculation (min): 31 min  Charges: OT General Charges $OT Visit: 1 Visit OT Treatments $Self Care/Home Management : 8-22 mins  Arman Filter., MPH, MS, OTR/L ascom [phone] 10/20/22, 3:43 PM

## 2022-10-20 NOTE — Progress Notes (Signed)
Steven Repress, MD [address]  [address]  [place], [place] [address]  Main: [phone]  Fax: [phone] Pager: [phone]   Subjective: No further episodes of rectal bleeding as per patient's nurse Patient is undergoing PT/OT   Objective: Vital signs in last 24 hours: Vitals:   10/19/22 2004 10/20/22 0406 10/20/22 0410 10/20/22 0814  BP: 123/80  133/77 (!) 150/74  Pulse: (!) 59  (!) 52 (!) 55  Resp: 18  18 18   Temp: 99.2 F (37.3 C)  98.4 F (36.9 C) 97.9 F (36.6 C)  TempSrc: Oral  Oral   SpO2: 94%  99% 100%  Weight:  95.3 kg     Weight change: -1.8 kg  Intake/Output Summary (Last 24 hours) at 10/20/2022 1212 Last data filed at 10/20/2022 0300 Gross per 24 hour  Intake --  Output 1200 ml  Net -1200 ml     Exam: Heart:: Regular rate and rhythm, S1S2 present, or without murmur or extra heart sounds Lungs: normal and clear to auscultation Abdomen: soft, nontender, normal bowel sounds   Lab Results:    Latest Ref Rng & Units 10/20/2022    5:26 AM 10/19/2022   12:45 PM 10/19/2022    5:12 AM  CBC  WBC 4.0 - 10.5 K/uL 6.7   7.6   Hemoglobin 13.0 - 17.0 g/dL 32.4  40.1  02.7   Hematocrit 39.0 - 52.0 % 40.7  38.1  36.8   Platelets 150 - 400 K/uL 277   260       Latest Ref Rng & Units 10/20/2022    5:26 AM 10/19/2022    5:12 AM 10/18/2022    5:23 AM  CMP  Glucose 70 - 99 mg/dL 97  253  664   BUN 8 - 23 mg/dL 49  44  42   Creatinine 0.61 - 1.24 mg/dL 4.03  4.74  2.59   Sodium 135 - 145 mmol/L 138  136  136   Potassium 3.5 - 5.1 mmol/L 3.5  3.6  3.5   Chloride 98 - 111 mmol/L 101  105  103   CO2 22 - 32 mmol/L 24  26  26    Calcium 8.9 - 10.3 mg/dL 8.8  8.2  8.4     Micro Results: No results found for this or any previous visit (from the past 240 hour(s)). Studies/Results: DG Abd Portable 1V  Result Date: 10/19/2022 CLINICAL DATA:  Abdominal distension EXAM: PORTABLE ABDOMEN - 1 VIEW COMPARISON:  10/17/2022 FINDINGS: Scattered large and  small bowel gas is noted. No obstructive changes are seen. No free air is noted. 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 Degenerative changes of the thoracolumbar spine are noted. IMPRESSION: No acute abnormality noted. Electronically Signed   By: Alcide Clever M.D.   On: 10/19/2022 17:49   Medications: I have reviewed the patient's current medications. Prior to Admission:  Medications Prior to Admission  Medication Sig Dispense Refill Last Dose   acetaminophen (TYLENOL) 500 MG tablet Take 2 tablets (1,000 mg total) by mouth every 6 (six) hours as needed (pain). 60 tablet 0 prn at prn   apixaban (ELIQUIS) 5 MG TABS tablet Take 1 tablet (5 mg total) by mouth 2 (two) times daily. 60 tablet 6 10/13/2022 at 2000   hydrALAZINE (APRESOLINE) 25 MG tablet Take 1 tablet by mouth 3 (three) times daily.   10/13/2022   hydrochlorothiazide (HYDRODIURIL) 25 MG tablet Take 25 mg by mouth daily.   10/13/2022   melatonin 3  MG TABS tablet Take 6 mg by mouth at bedtime.   10/12/2022   Menthol, Topical Analgesic, (ICY HOT ORIGINAL PAIN RELIEF) 2.5 % GEL Apply topically as needed.   prn at unk   metoprolol tartrate (LOPRESSOR) 25 MG tablet Take 25 mg by mouth 2 (two) times daily.   10/13/2022   ondansetron (ZOFRAN) 4 MG tablet Take 1 tablet (4 mg total) by mouth every 6 (six) hours as needed for nausea. 30 tablet 0 prn at unk   lidocaine (LIDODERM) 5 % Place 1 patch onto the skin every 12 (twelve) hours. Remove & Discard patch within 12 hours or as directed by MD (Patient not taking: Reported on 10/14/2022) 10 patch 0 Not Taking   Scheduled:  cephALEXin  500 mg Oral Q8H   chlorproMAZINE  25 mg Oral TID   feeding supplement  237 mL Oral BID BM   hydrALAZINE  50 mg Oral TID   metoprolol succinate  12.5 mg Oral Daily   polyethylene glycol  17 g Oral BID   sodium chloride flush  3 mL Intravenous Q12H   Continuous: GUY:[id number] **OR** acetaminophen, atropine, hydrALAZINE, ondansetron **OR** ondansetron (ZOFRAN) IV,  oxyCODONE Anti-infectives (From admission, onward)    Start     Dose/Rate Route Frequency Ordered Stop   10/18/22 1400  cephALEXin (KEFLEX) capsule 500 mg        500 mg Oral Every 8 hours 10/18/22 1347        Scheduled Meds:  cephALEXin  500 mg Oral Q8H   chlorproMAZINE  25 mg Oral TID   feeding supplement  237 mL Oral BID BM   hydrALAZINE  50 mg Oral TID   metoprolol succinate  12.5 mg Oral Daily   polyethylene glycol  17 g Oral BID   sodium chloride flush  3 mL Intravenous Q12H   Continuous Infusions: PRN Meds:.acetaminophen **OR** acetaminophen, atropine, hydrALAZINE, ondansetron **OR** ondansetron (ZOFRAN) IV, oxyCODONE   Assessment: Principal Problem:   Postural dizziness with presyncope Active Problems:   Hypertension   Status post reverse arthroplasty of shoulder, left   PAF (paroxysmal atrial fibrillation) (HCC)   SSS (sick sinus syndrome) (HCC)   AKI (acute kidney injury) (HCC)   Symptomatic bradycardia   Hypokalemia   Insomnia   Rectal bleeding  Steven Welch is a 82 y.o. male with history of paroxysmal A-fib on Eliquis, sick sinus syndrome, presented with syncope, new onset of AKI, symptomatic bradycardia.  GI is consulted for witnessed episode of rectal bleeding, this was an isolated episode  Plan: Right bleeding, 1 episode and self-limited Hemoglobin normal Do not recommend any further evaluation Okay to resume Eliquis  GI will sign off, please call us back with questions or concerns   LOS: 6 days   Steven Welch 10/20/2022, 12:12 PM

## 2022-10-20 NOTE — Care Management Important Message (Signed)
Important Message  Patient Details  Name: Steven Welch MRN: [id number] Date of Birth: [birth year]   Medicare Important Message Given:  Yes  Patient asleep upon time of visit, no family in room.  Copy of Medicare IM left on counter in room for reference.   [name] 10/20/2022, 12:57 PM

## 2022-10-20 NOTE — Progress Notes (Signed)
PROGRESS NOTE    Steven Welch  UJW:[id number] DOB: May 27, 1940 DOA: 10/13/2022 PCP: Dorothey Baseman, MD   Assessment & Plan:   Principal Problem:   Postural dizziness with presyncope Active Problems:   Symptomatic bradycardia   SSS (sick sinus syndrome) (HCC)   PAF (paroxysmal atrial fibrillation) (HCC)   AKI (acute kidney injury) (HCC)   Hypertension   Status post reverse arthroplasty of shoulder, left   Hypokalemia   Insomnia   Rectal bleeding  Assessment and Plan: Possible GI bleed: red blood w/ BM 10/19/22. No bloody BM today so far. Holding eliquis but will likely restart later this afternoon or tomorrow morning. Was taking NSAIDs daily at home. No scopes needed as per GI. XR abd shows no acute abnormality noted. No contrast can be given secondary to AKI   Postural dizziness: with presyncope. Possibly secondary to bradycardia. Continue on reduced metoprolol dose as per cardio. Continue on tele    PAF: holding eliquis but will likely restart later this afternoon or tomorrow morning. Continue on reduced dose of metoprolol. Continue on tele. Follow up in clinic with Dr. Darrold Junker in 1-2 weeks.     AKI: Cr is trending up slightly from day prior. Avoid nephrotoxic meds    Hypokalemia: WNL today    S/p reverse left arthroplasty of shoulder: continue w/ supportive care    HTN: continue on hydralazine & reduced dose of metoprolol. Holding HCTZ  Poor oral intake: continue on nutritional supplements    Cellulitis of right dorsal hand: continue on keflex        DVT prophylaxis: SCDs Code Status: full  Family Communication: Disposition Plan: PT/OT recs SNF but pt's wife is no refusing SNF. Will likely d/c home w/ HH   Level of care: Med-Surg  Status is: Inpatient Remains inpatient appropriate because: severity of illness   Consultants:  GI   Procedures:   Antimicrobials: keflex   Subjective: Pt c/o fatigue  Objective: Vitals:   10/19/22 2004 10/20/22 0406  10/20/22 0410 10/20/22 0814  BP: 123/80  133/77 (!) 150/74  Pulse: (!) 59  (!) 52 (!) 55  Resp: 18  18 18   Temp: 99.2 F (37.3 C)  98.4 F (36.9 C) 97.9 F (36.6 C)  TempSrc: Oral  Oral   SpO2: 94%  99% 100%  Weight:  95.3 kg      Intake/Output Summary (Last 24 hours) at 10/20/2022 0828 Last data filed at 10/20/2022 0300 Gross per 24 hour  Intake --  Output 1200 ml  Net -1200 ml   Filed Weights   10/18/22 0500 10/19/22 0500 10/20/22 0406  Weight: 101.9 kg 97.1 kg 95.3 kg    Examination:  General exam: Appears comfortable  Respiratory system: clear breath sounds b/l  Cardiovascular system: irregularly irregular. No gallops or rubs  Gastrointestinal system: abd is soft, NT, obese & hypoactive bowel sounds  Central nervous system: alert & awake. Moves all extremities  Psychiatry: Judgement and insight appears at baseline. Flat mood and affect     Data Reviewed: I have personally reviewed following labs and imaging studies  CBC: Recent Labs  Lab 10/13/22 2109 10/15/22 0348 10/16/22 0409 10/17/22 0340 10/18/22 0523 10/19/22 0512 10/19/22 1245 10/20/22 0526  WBC 8.9   < > 7.8 8.2 6.9 7.6  --  6.7  NEUTROABS 6.3  --   --   --   --   --   --   --   HGB 13.8   < > 13.1 13.1 12.1* 12.5* 12.6*  13.5  HCT 42.0   < > 38.8* 39.0 35.5* 36.8* 38.1* 40.7  MCV 95.0   < > 92.6 92.4 92.0 92.5  --  93.1  PLT 366   < > 308 299 241 260  --  277   < > = values in this interval not displayed.   Basic Metabolic Panel: Recent Labs  Lab 10/14/22 0830 10/15/22 0348 10/16/22 0409 10/17/22 0340 10/18/22 0523 10/19/22 0512 10/20/22 0526  NA 140   < > 139 139 136 136 138  K 3.1*   < > 3.3* 3.7 3.5 3.6 3.5  CL 105   < > 105 104 103 105 101  CO2 26   < > 25 25 26 26 24   GLUCOSE 92   < > 115* 105* 100* 102* 97  BUN 47*   < > 44* 41* 42* 44* 49*  CREATININE 2.70*   < > 2.97* 3.18* 3.28* 3.28* 3.29*  CALCIUM 7.9*   < > 8.8* 8.6* 8.4* 8.2* 8.8*  MG 2.4  --   --  2.3 2.2 2.4 2.6*   <  > = values in this interval not displayed.   GFR: Estimated Creatinine Clearance: 20.4 mL/min (A) (by C-G formula based on SCr of 3.29 mg/dL (H)). Liver Function Tests: Recent Labs  Lab 10/13/22 2109  AST 15  ALT 13  ALKPHOS 65  BILITOT 0.5  PROT 7.5  ALBUMIN 3.5   No results for input(s): "LIPASE", "AMYLASE" in the last 168 hours. No results for input(s): "AMMONIA" in the last 168 hours. Coagulation Profile: No results for input(s): "INR", "PROTIME" in the last 168 hours. Cardiac Enzymes: No results for input(s): "CKTOTAL", "CKMB", "CKMBINDEX", "TROPONINI" in the last 168 hours. BNP (last 3 results) No results for input(s): "PROBNP" in the last 8760 hours. HbA1C: No results for input(s): "HGBA1C" in the last 72 hours. CBG: Recent Labs  Lab 10/15/22 0518 10/16/22 0628 10/18/22 0424 10/20/22 0438 10/20/22 0632  GLUCAP 89 99 98 71 82   Lipid Profile: No results for input(s): "CHOL", "HDL", "LDLCALC", "TRIG", "CHOLHDL", "LDLDIRECT" in the last 72 hours. Thyroid Function Tests: No results for input(s): "TSH", "T4TOTAL", "FREET4", "T3FREE", "THYROIDAB" in the last 72 hours. Anemia Panel: No results for input(s): "VITAMINB12", "FOLATE", "FERRITIN", "TIBC", "IRON", "RETICCTPCT" in the last 72 hours. Sepsis Labs: No results for input(s): "PROCALCITON", "LATICACIDVEN" in the last 168 hours.  No results found for this or any previous visit (from the past 240 hour(s)).       Radiology Studies: DG Abd Portable 1V  Result Date: 10/19/2022 CLINICAL DATA:  Abdominal distension EXAM: PORTABLE ABDOMEN - 1 VIEW COMPARISON:  10/17/2022 FINDINGS: Scattered large and small bowel gas is noted. No obstructive changes are seen. No free air is noted. 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 Degenerative changes of the thoracolumbar spine are noted. IMPRESSION: No acute abnormality noted. Electronically Signed   By: Alcide Clever M.D.   On: 10/19/2022 17:49        Scheduled Meds:   cephALEXin  500 mg Oral Q8H   feeding supplement  237 mL Oral BID BM   feeding supplement  237 mL Oral BID BM   hydrALAZINE  50 mg Oral TID   metoprolol succinate  12.5 mg Oral Daily   polyethylene glycol  17 g Oral BID   sodium chloride flush  3 mL Intravenous Q12H   Continuous Infusions:   LOS: 6 days    Time spent: 25 mins     Emogene Morgan M  Mayford Knife, MD [facility] Pager [phone]  If 7PM-7AM, please contact night-coverage [email or web address] 10/20/2022, 8:28 AM

## 2022-10-20 NOTE — Progress Notes (Signed)
Patient is not able to walk the distance required to go the bathroom, or he/she is unable to safely negotiate stairs required to access the bathroom.  A 3in1 BSC will alleviate this problem  

## 2022-10-20 NOTE — TOC Progression Note (Signed)
Transition of Care [facility]) - Progression Note    Patient Details  Name: Steven Welch MRN: [id number] Date of Birth: Feb 22, 1941  Transition of Care [facility]) CM/SW Contact  [name], RN Phone Number: 10/20/2022, 4:01 PM  Clinical Narrative:     Plan originally this morning was for patient to return home with wife, with home health services, WC, and drop arm BSC  3 way call completed with daughter and wife  They now wish to accept bed at [facility].   Initiate authorization, and if Berkley Harvey approves daughter will pay 7 days up front  Tammy notified at [facility] Auth started in [facility] portal   Jon with [employer] notified not to delivered DME at this time         Expected Discharge Plan and Services                                               Social Determinants of Health (SDOH) Interventions SDOH Screenings   Food Insecurity: No Food Insecurity (10/14/2022)  Housing: Low Risk  (10/14/2022)  Transportation Needs: No Transportation Needs (10/14/2022)  Utilities: Not At Risk (10/14/2022)  Financial Resource Strain: Patient Declined (02/22/2022)   Received from [facility], [facility]  Tobacco Use: Low Risk  (09/22/2022)    Readmission Risk Interventions     No data to display

## 2022-10-20 NOTE — Progress Notes (Addendum)
[facility]  ROUNDING NOTE   Subjective:   Patient seen resting quietly Currently eating breakfast Remains on room air  Chart review voices concerns of possible GI bleed Patient denies any bloody BMs Lt arm sling present  Creatinine 3.29 Urine output 1.2L in proceeding 24 hours  Objective:  Vital signs in last 24 hours:  Temp:  [97.9 F (36.6 C)-99.2 F (37.3 C)] 97.9 F (36.6 C) (07/25 0814) Pulse Rate:  [52-61] 55 (07/25 0814) Resp:  [18] 18 (07/25 0814) BP: (123-150)/(74-92) 150/74 (07/25 0814) SpO2:  [94 %-100 %] 100 % (07/25 0814) Weight:  [95.3 kg] 95.3 kg (07/25 0406)  Weight change: -1.8 kg Filed Weights   10/18/22 0500 10/19/22 0500 10/20/22 0406  Weight: 101.9 kg 97.1 kg 95.3 kg    Intake/Output: I/O last 3 completed shifts: In: 0  Out: 1950 [Urine:1950]   Intake/Output this shift:  No intake/output data recorded.  Physical Exam: General: NAD  Head: Normocephalic, atraumatic. Moist oral mucosal membranes  Eyes: Anicteric  Lungs:  Clear to auscultation, normal effort  Heart: Regular rate and rhythm  Abdomen:  Soft, nontender, distended  Extremities:  Trace peripheral edema.  Neurologic: Alert and oriented, moving all four extremities  Skin: No lesions  Access: None     Basic Metabolic Panel: Recent Labs  Lab 10/14/22 0830 10/15/22 0348 10/16/22 0409 10/17/22 0340 10/18/22 0523 10/19/22 0512 10/20/22 0526  NA 140   < > 139 139 136 136 138  K 3.1*   < > 3.3* 3.7 3.5 3.6 3.5  CL 105   < > 105 104 103 105 101  CO2 26   < > 25 25 26 26 24   GLUCOSE 92   < > 115* 105* 100* 102* 97  BUN 47*   < > 44* 41* 42* 44* 49*  CREATININE 2.70*   < > 2.97* 3.18* 3.28* 3.28* 3.29*  CALCIUM 7.9*   < > 8.8* 8.6* 8.4* 8.2* 8.8*  MG 2.4  --   --  2.3 2.2 2.4 2.6*   < > = values in this interval not displayed.    Liver Function Tests: Recent Labs  Lab 10/13/22 2109  AST 15  ALT 13  ALKPHOS 65  BILITOT 0.5  PROT 7.5  ALBUMIN 3.5    No results for input(s): "LIPASE", "AMYLASE" in the last 168 hours. No results for input(s): "AMMONIA" in the last 168 hours.  CBC: Recent Labs  Lab 10/13/22 2109 10/15/22 0348 10/16/22 0409 10/17/22 0340 10/18/22 0523 10/19/22 0512 10/19/22 1245 10/20/22 0526  WBC 8.9   < > 7.8 8.2 6.9 7.6  --  6.7  NEUTROABS 6.3  --   --   --   --   --   --   --   HGB 13.8   < > 13.1 13.1 12.1* 12.5* 12.6* 13.5  HCT 42.0   < > 38.8* 39.0 35.5* 36.8* 38.1* 40.7  MCV 95.0   < > 92.6 92.4 92.0 92.5  --  93.1  PLT 366   < > 308 299 241 260  --  277   < > = values in this interval not displayed.    Cardiac Enzymes: No results for input(s): "CKTOTAL", "CKMB", "CKMBINDEX", "TROPONINI" in the last 168 hours.  BNP: Invalid input(s): "POCBNP"  CBG: Recent Labs  Lab 10/15/22 0518 10/16/22 0628 10/18/22 0424 10/20/22 0438 10/20/22 0632  GLUCAP 89 99 98 71 82    Microbiology: Results for orders placed or performed during  the hospital encounter of 09/09/22  Surgical pcr screen     Status: None   Collection Time: 09/09/22  2:42 PM   Specimen: Nasal Mucosa; Nasal Swab  Result Value Ref Range Status   MRSA, PCR NEGATIVE NEGATIVE Final   Staphylococcus aureus NEGATIVE NEGATIVE Final    Comment: (NOTE) The Xpert SA Assay (FDA approved for NASAL specimens in patients 35 years of age and older), is one component of a comprehensive surveillance program. It is not intended to diagnose infection nor to guide or monitor treatment. Performed at [facility], [address] [place], [place] [address]     Coagulation Studies: No results for input(s): "LABPROT", "INR" in the last 72 hours.  Urinalysis: Recent Labs    10/18/22 0940  COLORURINE YELLOW*  LABSPEC 1.008  PHURINE 6.0  GLUCOSEU NEGATIVE  HGBUR NEGATIVE  BILIRUBINUR NEGATIVE  KETONESUR NEGATIVE  PROTEINUR NEGATIVE  NITRITE POSITIVE*  LEUKOCYTESUR NEGATIVE      Imaging: DG Abd Portable 1V  Result Date:  10/19/2022 CLINICAL DATA:  Abdominal distension EXAM: PORTABLE ABDOMEN - 1 VIEW COMPARISON:  10/17/2022 FINDINGS: Scattered large and small bowel gas is noted. No obstructive changes are seen. No free air is noted. 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 Degenerative changes of the thoracolumbar spine are noted. IMPRESSION: No acute abnormality noted. Electronically Signed   By: Alcide Clever M.D.   On: 10/19/2022 17:49     Medications:     cephALEXin  500 mg Oral Q8H   feeding supplement  237 mL Oral BID BM   hydrALAZINE  50 mg Oral TID   metoprolol succinate  12.5 mg Oral Daily   polyethylene glycol  17 g Oral BID   sodium chloride flush  3 mL Intravenous Q12H   acetaminophen **OR** acetaminophen, atropine, hydrALAZINE, ondansetron **OR** ondansetron (ZOFRAN) IV, oxyCODONE  Assessment/ Plan:  [name] is a 82 y.o.  male with past medical conditions including sick sinus syndrome, hypertension, paroxysmal atrial fibrillation, frequent falls, who was admitted to [facility] on 10/13/2022 for Sinus bradycardia [R00.1] AKI (acute kidney injury) (HCC) [N17.9] Postural dizziness with presyncope [R42, R55]   Acute kidney injury secondary to ATN, unclear underlying cause but likely hemodynamic instability prior to admission. Baseline creatinine appears to be 1.17 on 09/23/2022. Renal ultrasound shows a mild bilateral hydronephrosis without obstruction.   Creatinine on admission 3.23.  - Creatinine stable -Serologic workup pending: SPEP, kappa lambda chains, C4/C3 complements, ANA, and ANCA Lab Results  Component Value Date   CREATININE 3.29 (H) 10/20/2022   CREATININE 3.28 (H) 10/19/2022   CREATININE 3.28 (H) 10/18/2022    Intake/Output Summary (Last 24 hours) at 10/20/2022 1034 Last data filed at 10/20/2022 0300 Gross per 24 hour  Intake --  Output 1200 ml  Net -1200 ml   2.  Hypertension, essential.  Home regimen includes hydrochlorothiazide, hydralazine, and metoprolol.   Hydrochlorothiazide held.   - Blood pressure slightly elevated today, 150/74  -Will continue to monitor for now  3. Anemia of acute blood loss: Lab Results  Component Value Date   HGB 13.5 10/20/2022  Hemoglobin decreased yesterday, GI consulted for evaluation of possible GI bleed.  GI feels this is an isolated episode of rectal bleeding and hemoglobin has stabilized.  Recommend holding Eliquis for 24 hours prior to resuming.   LOS: 6 Steven Welch 7/25/202410:34 AM   Patient was seen and examined with Wendee Beavers, NP.  I personally formulated plan of care for the problems addressed and discussed with  NP.  I agree with the note as documented except as noted below. I take the responsibility for the inherent risk of patient management.

## 2022-10-20 NOTE — Plan of Care (Signed)

## 2022-10-20 NOTE — Progress Notes (Addendum)
Physical Therapy Treatment Patient Details Name: Steven Welch MRN: [id number] DOB: [birth year] Today's Date: 10/20/2022   History of Present Illness Pt is an 82 y/o male admitted after sustaining a fall at home secondary to a syncopal episode. Pt with no injuries from the fall. Pt found to have intermittent recurrent vertigo. 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    PT Comments  Pt presents laying in bed, no complaints of pain. Pt cotreating with OT for therapist/patient safety and maximizing functional ability.   Pt able to initiate supine>sit, but required minAx1>modAx2 for task completion, forward scooting, and trunk stability while maintaining LUE NWB precautions. Pt performed sit<>stand transfer from EOB with RUE support, min>modAx2, repetitive cueing for hand positioning and trunk rocking, elevated bed height, and RW. Pt showed some improvement with sit<>stand transfer from recliner, only requiring minAx2 and repetitive cueing. Pt would benefit from continued skilled therapy to maximize functional abilities.     Assistance Recommended at Discharge Frequent or constant Supervision/Assistance  If plan is discharge home, recommend the following:  Can travel by private vehicle    Assistance with cooking/housework;Assist for transportation;Help with stairs or ramp for entrance;Two people to help with walking and/or transfers;Two people to help with bathing/dressing/bathroom;Direct supervision/assist for medications management;Direct supervision/assist for financial management   No  Equipment Recommendations  Wheelchair (measurements PT);Rolling walker (2 wheels);Other (comment) (Bariatric Drop Arm BSC)    Recommendations for Other Services       Precautions / Restrictions Precautions Precautions: Fall Shoulder Interventions: Shoulder sling/immobilizer Precaution Booklet Issued: No Precaution  Comments: Pt wearing shoulder sling today Restrictions Weight Bearing Restrictions: Yes LUE Weight Bearing: Non weight bearing Other Position/Activity Restrictions: Ortho contacted yesterday on NWB orders, defer to previous note     Mobility  Bed Mobility Overal bed mobility: Needs Assistance Bed Mobility: Supine to Sit     Supine to sit: Mod assist, +2 for physical assistance, Min assist     General bed mobility comments: Required min->modAx2 for trunk stability and task completion. Unable to use LUE due to precautions. Unable to scoot to EOB to place feet flat on floor    Transfers Overall transfer level: Needs assistance Equipment used: Rolling walker (2 wheels) Transfers: Sit to/from Stand Sit to Stand: Mod assist, +2 physical assistance, From elevated surface, Min assist           General transfer comment: Initially required modAx2 for sit<> stand transfers from elevated bed, later able to lateral step to recliner and perform sit<>stand minAx2    Ambulation/Gait               General Gait Details: Pt   Stairs             Wheelchair Mobility     Tilt Bed    Modified Rankin (Stroke Patients Only)       Balance Overall balance assessment: History of Falls, Needs assistance Sitting-balance support: Feet unsupported, Single extremity supported Sitting balance-Leahy Scale: Poor Sitting balance - Comments: Pt able to maintain upright posturing intially, but experienced posterior lean after a few minutes Postural control: Posterior lean Standing balance support: Single extremity supported, Reliant on assistive device for balance Standing balance-Leahy Scale: Poor                              Cognition Arousal/Alertness: Awake/alert Behavior During Therapy: WFL for tasks assessed/performed Overall Cognitive  Status: No family/caregiver present to determine baseline cognitive functioning Area of Impairment: Memory, Safety/judgement                      Memory: Decreased short-term memory, Decreased recall of precautions   Safety/Judgement: Decreased awareness of deficits     General Comments: Unable to recall shoulder surgery,precautions, and need for sling.        Exercises Other Exercises Other Exercises: 2 steps forward/back x2, required MinAx2 with backwards stepping and very unsteady    General Comments        Pertinent Vitals/Pain Pain Assessment Pain Assessment: No/denies pain    Home Living                          Prior Function            PT Goals (current goals can now be found in the care plan section) Acute Rehab PT Goals Patient Stated Goal: to get his health issues addressed PT Goal Formulation: With patient Time For Goal Achievement: 10/30/22 Potential to Achieve Goals: Fair Progress towards PT goals: Progressing toward goals    Frequency    Min 1X/week      PT Plan Current plan remains appropriate    Co-evaluation PT/OT/SLP Co-Evaluation/Treatment: Yes Reason for Co-Treatment: For patient/therapist safety;To address functional/ADL transfers PT goals addressed during session: Mobility/safety with mobility;Proper use of DME OT goals addressed during session: ADL's and self-care      AM-PAC PT "6 Clicks" Mobility   Outcome Measure  Help needed turning from your back to your side while in a flat bed without using bedrails?: A Little Help needed moving from lying on your back to sitting on the side of a flat bed without using bedrails?: A Lot Help needed moving to and from a bed to a chair (including a wheelchair)?: A Lot Help needed standing up from a chair using your arms (e.g., wheelchair or bedside chair)?: A Lot Help needed to walk in hospital room?: A Lot Help needed climbing 3-5 steps with a railing? : Total 6 Click Score: 12    End of Session Equipment Utilized During Treatment: Gait belt Activity Tolerance: Patient tolerated treatment  well Patient left: in chair;with call bell/phone within reach;with chair alarm set   PT Visit Diagnosis: Other abnormalities of gait and mobility (R26.89) Pain - Right/Left: Left Pain - part of body: Shoulder     Time: 0981-1914 PT Time Calculation (min) (ACUTE ONLY): 31 min  Charges:    $Therapeutic Activity: 8-22 mins PT General Charges $$ ACUTE PT VISIT: 1 Visit                     Ahrianna Siglin, PT, SPT 3:35 PM,10/20/22

## 2022-10-21 DIAGNOSIS — R42 Dizziness and giddiness: Secondary | ICD-10-CM | POA: Diagnosis not present

## 2022-10-21 DIAGNOSIS — R55 Syncope and collapse: Secondary | ICD-10-CM | POA: Diagnosis not present

## 2022-10-21 LAB — GLUCOSE, CAPILLARY: Glucose-Capillary: 103 mg/dL — ABNORMAL HIGH (ref 70–99)

## 2022-10-21 MED ORDER — CEPHALEXIN 500 MG PO CAPS: 500.0000 mg | ORAL_CAPSULE | Freq: Three times a day (TID) | ORAL | Status: AC

## 2022-10-21 MED ORDER — METOPROLOL SUCCINATE ER 25 MG PO TB24: 12.5000 mg | ORAL_TABLET | Freq: Every day | ORAL | Status: DC

## 2022-10-21 MED ORDER — APIXABAN 2.5 MG PO TABS: 2.5000 mg | ORAL_TABLET | Freq: Two times a day (BID) | ORAL | Status: DC

## 2022-10-21 MED ORDER — APIXABAN 2.5 MG PO TABS
2.5000 mg | ORAL_TABLET | Freq: Two times a day (BID) | ORAL | Status: DC
  Administered 2022-10-21: 2.5 mg via ORAL
  Filled 2022-10-21: qty 1

## 2022-10-21 NOTE — Progress Notes (Addendum)
Physical Therapy Treatment Patient Details Name: Steven Welch MRN: [id number] DOB: [birth year] Today's Date: 10/21/2022   History of Present Illness Pt is an 82 y/o male admitted after sustaining a fall at home secondary to a syncopal episode. Pt with no injuries from the fall. Pt found to have intermittent recurrent vertigo. 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    PT Comments  Pt presenting in bed and willing to participate in therapy. He was able to scoot LE to EOB in supine, but required maxAx1 and RW for supine>sit and sit>stand transfers. Pt noted to utilize more of RUE to assist with trunk stability and contribution to movement today. Pt sitting balance overall improved and able to maintain upright with CGA for safety. He tolerated ~167ft today with minA, RW, and initial assistance with guiding RW. Pt required repetitive verbal cueing for hand placement on RW throughout session. Pt would benefit from continued skilled therapy to maximize functional abilities.     Assistance Recommended at Discharge Frequent or constant Supervision/Assistance  If plan is discharge home, recommend the following:  Can travel by private vehicle    Assistance with cooking/housework;Assist for transportation;Help with stairs or ramp for entrance;Two people to help with walking and/or transfers;Two people to help with bathing/dressing/bathroom;Direct supervision/assist for medications management;Direct supervision/assist for financial management   No  Equipment Recommendations  Wheelchair (measurements PT);Rolling walker (2 wheels);Other (comment)    Recommendations for Other Services       Precautions / Restrictions Precautions Precautions: Fall Shoulder Interventions: Shoulder sling/immobilizer Restrictions Weight Bearing Restrictions: Yes LUE Weight Bearing: Non weight bearing     Mobility  Bed  Mobility Overal bed mobility: Needs Assistance Bed Mobility: Supine to Sit     Supine to sit: Mod assist, Max assist     General bed mobility comments: Max-ModA x1 for trunk control, improved utilization of RUE to assist with sitting up    Transfers Overall transfer level: Needs assistance Equipment used: Rolling walker (2 wheels) Transfers: Sit to/from Stand Sit to Stand: Max assist           General transfer comment: Required rocking to stand and verbal cueing for hand placement on RW.    Ambulation/Gait Ambulation/Gait assistance: Min assist Gait Distance (Feet): 100 Feet Assistive device: Rolling walker (2 wheels) Gait Pattern/deviations: Shuffle Gait velocity: decreased     General Gait Details: 1 rest break ~47ft, initially needed assist with guiding RW due to only being able to use RUE   Stairs             Wheelchair Mobility     Tilt Bed    Modified Rankin (Stroke Patients Only)       Balance Overall balance assessment: History of Falls, Needs assistance Sitting-balance support: Feet supported, Single extremity supported Sitting balance-Leahy Scale: Fair     Standing balance support: Single extremity supported, Reliant on assistive device for balance Standing balance-Leahy Scale: Poor                              Cognition Arousal/Alertness: Awake/alert Behavior During Therapy: WFL for tasks assessed/performed   Area of Impairment: Safety/judgement                         Safety/Judgement: Decreased awareness of deficits, Decreased awareness of safety  Exercises      General Comments        Pertinent Vitals/Pain Pain Assessment Pain Assessment: Faces Faces Pain Scale: No hurt    Home Living                          Prior Function            PT Goals (current goals can now be found in the care plan section) Acute Rehab PT Goals Patient Stated Goal: to get his health  issues addressed PT Goal Formulation: With patient Time For Goal Achievement: 10/30/22 Potential to Achieve Goals: Fair Progress towards PT goals: Progressing toward goals    Frequency    Min 1X/week      PT Plan Current plan remains appropriate    Co-evaluation              AM-PAC PT "6 Clicks" Mobility   Outcome Measure  Help needed turning from your back to your side while in a flat bed without using bedrails?: A Little Help needed moving from lying on your back to sitting on the side of a flat bed without using bedrails?: A Lot Help needed moving to and from a bed to a chair (including a wheelchair)?: A Lot Help needed standing up from a chair using your arms (e.g., wheelchair or bedside chair)?: A Lot Help needed to walk in hospital room?: A Little Help needed climbing 3-5 steps with a railing? : Total 6 Click Score: 13    End of Session Equipment Utilized During Treatment: Gait belt Activity Tolerance: Patient tolerated treatment well Patient left: in chair;with call bell/phone within reach;with chair alarm set;with family/visitor present   PT Visit Diagnosis: Other abnormalities of gait and mobility (R26.89)     Time: 0865-7846 PT Time Calculation (min) (ACUTE ONLY): 21 min  Charges:    $Therapeutic Activity: 8-22 mins PT General Charges $$ ACUTE PT VISIT: 1 Visit                    Mishka Stegemann, PT, SPT 3:27 PM,10/21/22

## 2022-10-21 NOTE — Progress Notes (Signed)
PROGRESS NOTE    Steven Welch  JXB:[id number] DOB: 1940/05/19 DOA: 10/13/2022 PCP: Dorothey Baseman, MD   Assessment & Plan:   Principal Problem:   Postural dizziness with presyncope Active Problems:   Symptomatic bradycardia   SSS (sick sinus syndrome) (HCC)   PAF (paroxysmal atrial fibrillation) (HCC)   AKI (acute kidney injury) (HCC)   Hypertension   Status post reverse arthroplasty of shoulder, left   Hypokalemia   Insomnia   Rectal bleeding  Assessment and Plan: Possible GI bleed: red blood w/ BM 10/19/22 and no more bloody BMs since. Restarted eliquis. Was taking NSAIDs daily at home. No scopes needed as per GI & GI signed off. XR abd shows no acute abnormality noted.   Postural dizziness: with presyncope. Possibly secondary to bradycardia. Continue on reduced dose of metoprolol   PAF: continue on reduced dose of metoprolol & restarted eliquis. Continue on tele. Follow up in clinic with Dr. Darrold Junker in 1-2 weeks.     AKI: Cr is labile. Avoid nephrotoxic meds    Hypokalemia: WNL today    S/p reverse left arthroplasty of shoulder: continue w/ supportive care   HTN: continue on reduced dose of metoprolol & continue on hydralazine. Holding HCTZ  Poor oral intake: continue on nutritional supplements    Cellulitis of right dorsal hand: will complete keflex course after tomorrow's dosing        DVT prophylaxis: SCDs Code Status: full  Family Communication: Disposition Plan: PT/OT recs SNF. Pt's wife is now agreeable to SNF   Level of care: Med-Surg  Status is: Inpatient Remains inpatient appropriate because: medically stable. Waiting on insurance auth    Consultants:  GI   Procedures:   Antimicrobials: keflex   Subjective: Pt c/o malaise   Objective: Vitals:   10/20/22 1656 10/20/22 2019 10/21/22 0500 10/21/22 0634  BP: (!) 151/68 (!) 146/91  (!) 148/78  Pulse: 72 83  78  Resp: 18 18    Temp: 97.9 F (36.6 C) 97.8 F (36.6 C)  97.6 F (36.4 C)   TempSrc: Oral Oral  Oral  SpO2: 98% 96%  99%  Weight: 95.3 kg  96.2 kg   Height: 5\' 10"  (1.778 m)       Intake/Output Summary (Last 24 hours) at 10/21/2022 1308 Last data filed at 10/21/2022 0100 Gross per 24 hour  Intake 0 ml  Output 850 ml  Net -850 ml   Filed Weights   10/20/22 0406 10/20/22 1656 10/21/22 0500  Weight: 95.3 kg 95.3 kg 96.2 kg    Examination:  General exam: Appears calm & comfortable  Respiratory system: clear breath sounds b/l  Cardiovascular system: irregularly irregular  Gastrointestinal system: abd is soft, NT, obese & normal bowel sounds Central nervous system: alert & awake. Moves all extremities  Psychiatry: Judgement and insight appears at baseline. Flat mood and affect     Data Reviewed: I have personally reviewed following labs and imaging studies  CBC: Recent Labs  Lab 10/16/22 0409 10/17/22 0340 10/18/22 0523 10/19/22 0512 10/19/22 1245 10/20/22 0526  WBC 7.8 8.2 6.9 7.6  --  6.7  HGB 13.1 13.1 12.1* 12.5* 12.6* 13.5  HCT 38.8* 39.0 35.5* 36.8* 38.1* 40.7  MCV 92.6 92.4 92.0 92.5  --  93.1  PLT 308 299 241 260  --  277   Basic Metabolic Panel: Recent Labs  Lab 10/14/22 0830 10/15/22 0348 10/16/22 0409 10/17/22 0340 10/18/22 0523 10/19/22 0512 10/20/22 0526  NA 140   < > 139 139  136 136 138  K 3.1*   < > 3.3* 3.7 3.5 3.6 3.5  CL 105   < > 105 104 103 105 101  CO2 26   < > 25 25 26 26 24   GLUCOSE 92   < > 115* 105* 100* 102* 97  BUN 47*   < > 44* 41* 42* 44* 49*  CREATININE 2.70*   < > 2.97* 3.18* 3.28* 3.28* 3.29*  CALCIUM 7.9*   < > 8.8* 8.6* 8.4* 8.2* 8.8*  MG 2.4  --   --  2.3 2.2 2.4 2.6*   < > = values in this interval not displayed.   GFR: Estimated Creatinine Clearance: 20.5 mL/min (A) (by C-G formula based on SCr of 3.29 mg/dL (H)). Liver Function Tests: No results for input(s): "AST", "ALT", "ALKPHOS", "BILITOT", "PROT", "ALBUMIN" in the last 168 hours.  No results for input(s): "LIPASE", "AMYLASE" in the  last 168 hours. No results for input(s): "AMMONIA" in the last 168 hours. Coagulation Profile: No results for input(s): "INR", "PROTIME" in the last 168 hours. Cardiac Enzymes: No results for input(s): "CKTOTAL", "CKMB", "CKMBINDEX", "TROPONINI" in the last 168 hours. BNP (last 3 results) No results for input(s): "PROBNP" in the last 8760 hours. HbA1C: No results for input(s): "HGBA1C" in the last 72 hours. CBG: Recent Labs  Lab 10/18/22 0424 10/20/22 0438 10/20/22 0632 10/20/22 1655 10/21/22 0549  GLUCAP 98 71 82 136* 103*   Lipid Profile: No results for input(s): "CHOL", "HDL", "LDLCALC", "TRIG", "CHOLHDL", "LDLDIRECT" in the last 72 hours. Thyroid Function Tests: No results for input(s): "TSH", "T4TOTAL", "FREET4", "T3FREE", "THYROIDAB" in the last 72 hours. Anemia Panel: No results for input(s): "VITAMINB12", "FOLATE", "FERRITIN", "TIBC", "IRON", "RETICCTPCT" in the last 72 hours. Sepsis Labs: No results for input(s): "PROCALCITON", "LATICACIDVEN" in the last 168 hours.  No results found for this or any previous visit (from the past 240 hour(s)).       Radiology Studies: DG Abd Portable 1V  Result Date: 10/19/2022 CLINICAL DATA:  Abdominal distension EXAM: PORTABLE ABDOMEN - 1 VIEW COMPARISON:  10/17/2022 FINDINGS: Scattered large and small bowel gas is noted. No obstructive changes are seen. No free air is noted. 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 Degenerative changes of the thoracolumbar spine are noted. IMPRESSION: No acute abnormality noted. Electronically Signed   By: Alcide Clever M.D.   On: 10/19/2022 17:49        Scheduled Meds:  apixaban  2.5 mg Oral BID   cephALEXin  500 mg Oral Q8H   chlorproMAZINE  25 mg Oral TID   feeding supplement  237 mL Oral BID BM   hydrALAZINE  50 mg Oral TID   metoprolol succinate  12.5 mg Oral Daily   polyethylene glycol  17 g Oral BID   sodium chloride flush  3 mL Intravenous Q12H   Continuous Infusions:   LOS: 7 days     Time spent: 25 mins     Charise Killian, MD [facility] Pager [phone]  If 7PM-7AM, please contact night-coverage [email or web address] 10/21/2022, 8:23 AM

## 2022-10-21 NOTE — Progress Notes (Signed)
[facility]  ROUNDING NOTE   Subjective:   Patient seen resting quietly Remains on room air Wife at bedisde Lt arm sling present  Slight improvement in creatinine is noted to 3.02 today.  Urine output of 850 cc.  Objective:  Vital signs in last 24 hours:  Temp:  [97.5 F (36.4 C)-97.9 F (36.6 C)] 97.5 F (36.4 C) (07/26 0855) Pulse Rate:  [72-86] 86 (07/26 0855) Resp:  [16-18] 16 (07/26 0855) BP: (146-155)/(68-91) 155/90 (07/26 0855) SpO2:  [96 %-100 %] 100 % (07/26 0855) Weight:  [95.3 kg-96.2 kg] 96.2 kg (07/26 0500)  Weight change: 0 kg Filed Weights   10/20/22 0406 10/20/22 1656 10/21/22 0500  Weight: 95.3 kg 95.3 kg 96.2 kg    Intake/Output: I/O last 3 completed shifts: In: 0  Out: 2050 [Urine:2050]   Intake/Output this shift:  Total I/O In: 240 [P.O.:240] Out: -   Physical Exam: General: NAD  Head: Normocephalic, atraumatic. Moist oral mucosal membranes  Eyes: Anicteric  Lungs:  Clear to auscultation, normal effort  Heart: Irregular rhythm  Abdomen:  Soft, nontender, distended  Extremities:  Trace peripheral edema.  Neurologic: Alert and oriented,   Skin: No lesions  Access: None     Basic Metabolic Panel: Recent Labs  Lab 10/17/22 0340 10/18/22 0523 10/19/22 0512 10/20/22 0526 10/21/22 0900  NA 139 136 136 138 141  K 3.7 3.5 3.6 3.5 3.5  CL 104 103 105 101 104  CO2 25 26 26 24 24   GLUCOSE 105* 100* 102* 97 113*  BUN 41* 42* 44* 49* 54*  CREATININE 3.18* 3.28* 3.28* 3.29* 3.02*  CALCIUM 8.6* 8.4* 8.2* 8.8* 9.0  MG 2.3 2.2 2.4 2.6* 2.5*    Liver Function Tests: No results for input(s): "AST", "ALT", "ALKPHOS", "BILITOT", "PROT", "ALBUMIN" in the last 168 hours.  No results for input(s): "LIPASE", "AMYLASE" in the last 168 hours. No results for input(s): "AMMONIA" in the last 168 hours.  CBC: Recent Labs  Lab 10/17/22 0340 10/18/22 0523 10/19/22 0512 10/19/22 1245 10/20/22 0526 10/21/22 0900  WBC 8.2 6.9 7.6  --   6.7 7.3  HGB 13.1 12.1* 12.5* 12.6* 13.5 14.2  HCT 39.0 35.5* 36.8* 38.1* 40.7 43.0  MCV 92.4 92.0 92.5  --  93.1 94.3  PLT 299 241 260  --  277 270    Cardiac Enzymes: No results for input(s): "CKTOTAL", "CKMB", "CKMBINDEX", "TROPONINI" in the last 168 hours.  BNP: Invalid input(s): "POCBNP"  CBG: Recent Labs  Lab 10/18/22 0424 10/20/22 0438 10/20/22 0632 10/20/22 1655 10/21/22 0549  GLUCAP 98 71 82 136* 103*    Microbiology: Results for orders placed or performed during the hospital encounter of 09/09/22  Surgical pcr screen     Status: None   Collection Time: 09/09/22  2:42 PM   Specimen: Nasal Mucosa; Nasal Swab  Result Value Ref Range Status   MRSA, PCR NEGATIVE NEGATIVE Final   Staphylococcus aureus NEGATIVE NEGATIVE Final    Comment: (NOTE) The Xpert SA Assay (FDA approved for NASAL specimens in patients 53 years of age and older), is one component of a comprehensive surveillance program. It is not intended to diagnose infection nor to guide or monitor treatment. Performed at [facility], [address] [place], [place] [address]     Coagulation Studies: No results for input(s): "LABPROT", "INR" in the last 72 hours.  Urinalysis: No results for input(s): "COLORURINE", "LABSPEC", "PHURINE", "GLUCOSEU", "HGBUR", "BILIRUBINUR", "KETONESUR", "PROTEINUR", "UROBILINOGEN", "NITRITE", "LEUKOCYTESUR" in the last 72  hours.  Invalid input(s): "APPERANCEUR"     Imaging: DG Abd Portable 1V  Result Date: 10/19/2022 CLINICAL DATA:  Abdominal distension EXAM: PORTABLE ABDOMEN - 1 VIEW COMPARISON:  10/17/2022 FINDINGS: Scattered large and small bowel gas is noted. No obstructive changes are seen. No free air is noted. 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 Degenerative changes of the thoracolumbar spine are noted. IMPRESSION: No acute abnormality noted. Electronically Signed   By: Alcide Clever M.D.   On: 10/19/2022 17:49     Medications:     apixaban  2.5  mg Oral BID   cephALEXin  500 mg Oral Q8H   feeding supplement  237 mL Oral BID BM   hydrALAZINE  50 mg Oral TID   metoprolol succinate  12.5 mg Oral Daily   polyethylene glycol  17 g Oral BID   sodium chloride flush  3 mL Intravenous Q12H   acetaminophen **OR** acetaminophen, atropine, hydrALAZINE, ondansetron **OR** ondansetron (ZOFRAN) IV, oxyCODONE  Assessment/ Plan:  [name] is a 82 y.o.  male with past medical conditions including sick sinus syndrome, hypertension, paroxysmal atrial fibrillation, frequent falls, who was admitted to [facility] on 10/13/2022 for Sinus bradycardia [R00.1] AKI (acute kidney injury) (HCC) [N17.9] Postural dizziness with presyncope [R42, R55]   Acute kidney injury secondary to ATN, unclear underlying cause but likely hemodynamic instability prior to admission. Baseline creatinine appears to be 1.17/ GFR > 60 on 09/23/2022. Renal ultrasound shows a mild bilateral hydronephrosis without obstruction.   Urinalysis from 10/18/2022-negative for blood, negative for protein.  0-5 WBCs. - Creatinine on admission 3.23.  Slowly improving.  Nonoliguric urine output. -Serologic workup pending: SPEP, kappa lambda chains, C4/C3 complements, ANA, and ANCA Lab Results  Component Value Date   CREATININE 3.02 (H) 10/21/2022   CREATININE 3.29 (H) 10/20/2022   CREATININE 3.28 (H) 10/19/2022    Intake/Output Summary (Last 24 hours) at 10/21/2022 1214 Last data filed at 10/21/2022 1150 Gross per 24 hour  Intake 240 ml  Output 850 ml  Net -610 ml  Electrolytes and volume status are acceptable.  No acute indication for dialysis at present.  2.  Hypertension, essential.  Home regimen includes hydrochlorothiazide, hydralazine, and metoprolol.  Hydrochlorothiazide held.   - Blood pressure now ranging in the upper 140s to 150s systolic.  -Will continue to monitor for now     LOS: 7 Steven Welch 7/26/202412:14 PM   Patient was seen and examined with Wendee Beavers,  NP.  I personally formulated plan of care for the problems addressed and discussed with NP.  I agree with the note as documented except as noted below. I take the responsibility for the inherent risk of patient management.

## 2022-10-21 NOTE — TOC Transition Note (Addendum)
Transition of Care [facility]) - CM/SW Discharge Note   Patient Details  Name: Steven Welch MRN: [id number] Date of Birth: 1940-11-23  Transition of Care [id number]) CM/SW Contact:  Liliana Cline, LCSW Phone Number: 10/21/2022, 2:26 PM   Clinical Narrative:    Discharge to [facility] today. Room 605B. Confirmed with Admissions Worker Tammy. Updated MD, RN, daughter Kenney Houseman, and wife Ki.  Asked RN to call report when DC Summary is in.  EMS paperwork completed. [facility] called for transport. Patient is 4th on the list. RN notified.     Final next level of care: Skilled Nursing Facility Barriers to Discharge: Barriers Resolved   Patient Goals and CMS Choice CMS Medicare.gov Compare Post Acute Care list provided to:: Patient Represenative (must comment) Choice offered to / list presented to : Adult Children, Spouse  Discharge Placement                Patient chooses bed at: [facility] [place] Patient to be transferred to facility by: [facility] Name of family member notified: wife and daughter Patient and family notified of of transfer: 10/21/22  Discharge Plan and Services Additional resources added to the After Visit Summary for                                       Social Determinants of Health (SDOH) Interventions SDOH Screenings   Food Insecurity: No Food Insecurity (10/14/2022)  Housing: Low Risk  (10/14/2022)  Transportation Needs: No Transportation Needs (10/14/2022)  Utilities: Not At Risk (10/14/2022)  Financial Resource Strain: Patient Declined (02/22/2022)   Received from [facility], [facility]  Tobacco Use: Low Risk  (10/20/2022)     Readmission Risk Interventions     No data to display

## 2022-10-21 NOTE — Plan of Care (Signed)
  Problem: Education: Goal: Knowledge of condition and prescribed therapy will improve 10/21/2022 0459 by Venita Lick, RN Outcome: Progressing 10/21/2022 0459 by Venita Lick, RN Outcome: Progressing   Problem: Cardiac: Goal: Will achieve and/or maintain adequate cardiac output Outcome: Progressing   Problem: Physical Regulation: Goal: Complications related to the disease process, condition or treatment will be avoided or minimized Outcome: Progressing   Problem: Education: Goal: Knowledge of General Education information will improve Description: Including pain rating scale, medication(s)/side effects and non-pharmacologic comfort measures Outcome: Progressing   Problem: Health Behavior/Discharge Planning: Goal: Ability to manage health-related needs will improve Outcome: Progressing   Problem: Clinical Measurements: Goal: Ability to maintain clinical measurements within normal limits will improve Outcome: Progressing Goal: Will remain free from infection Outcome: Progressing Goal: Diagnostic test results will improve Outcome: Progressing Goal: Respiratory complications will improve Outcome: Progressing Goal: Cardiovascular complication will be avoided Outcome: Progressing   Problem: Activity: Goal: Risk for activity intolerance will decrease Outcome: Progressing   Problem: Nutrition: Goal: Adequate nutrition will be maintained Outcome: Progressing   Problem: Coping: Goal: Level of anxiety will decrease Outcome: Progressing   Problem: Elimination: Goal: Will not experience complications related to bowel motility Outcome: Progressing Goal: Will not experience complications related to urinary retention Outcome: Progressing   Problem: Pain Managment: Goal: General experience of comfort will improve Outcome: Progressing   Problem: Safety: Goal: Ability to remain free from injury will improve Outcome: Progressing   Problem: Skin Integrity: Goal:  Risk for impaired skin integrity will decrease Outcome: Progressing

## 2022-10-21 NOTE — TOC Progression Note (Addendum)
Transition of Care [facility]) - Progression Note    Patient Details  Name: Steven Welch MRN: [id number] Date of Birth: 06/24/1940  [facility]) CM/SW Contact  Liliana Cline, LCSW Phone Number: 10/21/2022, 9:21 AM  Clinical Narrative:    Berkley Harvey is pending in [employer] portal.   12:40- Auth still pending.         Expected Discharge Plan and Services                                               Social Determinants of Health (SDOH) Interventions SDOH Screenings   Food Insecurity: No Food Insecurity (10/14/2022)  Housing: Low Risk  (10/14/2022)  Transportation Needs: No Transportation Needs (10/14/2022)  Utilities: Not At Risk (10/14/2022)  Financial Resource Strain: Patient Declined (02/22/2022)   Received from [facility], [facility]  Tobacco Use: Low Risk  (10/20/2022)    Readmission Risk Interventions     No data to display

## 2022-10-21 NOTE — Discharge Summary (Signed)
Physician Discharge Summary  Steven Welch PPI:[id number] DOB: Jun 07, 1940 DOA: 10/13/2022  PCP: Dorothey Baseman, MD  Admit date: 10/13/2022 Discharge date: 10/21/2022  Admitted From: home  Disposition:  SNF   Recommendations for Outpatient Follow-up:  Follow up with PCP in 1-2 weeks F/u w/ cardio, Dr. Darrold Junker, in 1-2 weeks   Home Health: no  Equipment/Devices:  Discharge Condition: stable  CODE STATUS: full  Diet recommendation: Heart Healthy  Brief/Interim Summary: HPI was taken from Dr. Para March: Steven Welch is a 82 y.o. male with medical history significant for paroxysmal atrial fibrillation, sick sinus syndrome, hypertension, frequent falls, last seen by cardiology 08/2022 who is s/p left shoulder surgery who presents following a syncopal event at home. He had no preceding symptoms.  He did not get hurt when he fell.  He had been recovering well from his surgery. ED course and data reviewed: Patient bradycardic to the mid to high 40s with otherwise normal vitals.  Labs notable for creatinine of 3.23 up from baseline of 1.  Troponin 19. Chest x-ray nonacute EKG with bradycardia Patient was treated with an IV fluid bolus.  He was placed on O2 at 2 L due to O2 sat into the mid 80s while asleep. Hospitalist consulted for admission.     Discharge Diagnoses:  Principal Problem:   Postural dizziness with presyncope Active Problems:   Symptomatic bradycardia   SSS (sick sinus syndrome) (HCC)   PAF (paroxysmal atrial fibrillation) (HCC)   AKI (acute kidney injury) (HCC)   Hypertension   Status post reverse arthroplasty of shoulder, left   Hypokalemia   Insomnia   Rectal bleeding  Possible GI bleed: red blood w/ BM 10/19/22 and no more bloody BMs since. Restarted eliquis. Was taking NSAIDs daily at home. No scopes needed as per GI & GI signed off. XR abd shows no acute abnormality noted.    Postural dizziness: with presyncope. Possibly secondary to bradycardia. Continue on reduced  dose of metoprolol   PAF: continue on reduced dose of metoprolol & restarted eliquis. Follow up in clinic with Dr. Darrold Junker in 1-2 weeks.     AKI: Cr is labile. Avoid nephrotoxic meds    Hypokalemia: WNL today    S/p reverse left arthroplasty of shoulder: continue w/ supportive care   HTN: continue on reduced dose of metoprolol & continue on hydralazine. Holding HCTZ   Poor oral intake: continue on nutritional supplements    Cellulitis of right dorsal hand: will complete keflex course after tomorrow's dosing   Discharge Instructions  Discharge Instructions     Diet - low sodium heart healthy   Complete by: As directed    Discharge instructions   Complete by: As directed    F/u w/ PCP in 1-2 weeks. F/u w/ cardio, Dr. Darrold Junker, in 1-2 weeks   Increase activity slowly   Complete by: As directed       Allergies as of 10/21/2022   No Known Allergies      Medication List     STOP taking these medications    metoprolol tartrate 25 MG tablet Commonly known as: LOPRESSOR       TAKE these medications    acetaminophen 500 MG tablet Commonly known as: TYLENOL Take 2 tablets (1,000 mg total) by mouth every 6 (six) hours as needed (pain).   apixaban 2.5 MG Tabs tablet Commonly known as: ELIQUIS Take 1 tablet (2.5 mg total) by mouth 2 (two) times daily. What changed:  medication strength how much to take  cephALEXin 500 MG capsule Commonly known as: KEFLEX Take 1 capsule (500 mg total) by mouth every 8 (eight) hours for 1 day.   hydrALAZINE 25 MG tablet Commonly known as: APRESOLINE Take 1 tablet by mouth 3 (three) times daily.   hydrochlorothiazide 25 MG tablet Commonly known as: HYDRODIURIL Take 25 mg by mouth daily.   Icy Hot Original Pain Relief 2.5 % Gel Generic drug: Menthol (Topical Analgesic) Apply topically as needed.   lidocaine 5 % Commonly known as: Lidoderm Place 1 patch onto the skin every 12 (twelve) hours. Remove & Discard patch within  12 hours or as directed by MD   melatonin 3 MG Tabs tablet Take 6 mg by mouth at bedtime.   metoprolol succinate 25 MG 24 hr tablet Commonly known as: TOPROL-XL Take 0.5 tablets (12.5 mg total) by mouth daily. Start taking on: October 22, 2022   ondansetron 4 MG tablet Commonly known as: ZOFRAN Take 1 tablet (4 mg total) by mouth every 6 (six) hours as needed for nausea.               Durable Medical Equipment  (From admission, onward)           Start     Ordered   10/20/22 1418  For home use only DME Bedside commode  Once       Comments: drop arm bariatric bedside commode  Question:  Patient needs a bedside commode to treat with the following condition  Answer:  Generalized weakness   10/20/22 1417   10/20/22 1358  For home use only DME lightweight manual wheelchair with seat cushion  Once       Comments: Patient suffers from generalized weakness which impairs their ability to perform daily activities like bathing, dressing, feeding, grooming, and toileting in the home.  A cane or walker will not resolve  issue with performing activities of daily living. A wheelchair will allow patient to safely perform daily activities. Patient is not able to propel themselves in the home using a standard weight wheelchair due to endurance and general weakness. Patient can self propel in the lightweight wheelchair. Length of need Lifetime. Accessories: elevating leg rests (ELRs), wheel locks, extensions and anti-tippers.   10/20/22 1358            Contact information for follow-up providers     Paraschos, Alexander, MD. Go in 1 week(s).   Specialty: Cardiology Contact information: [address] [facility] [place] [address] [phone]              Contact information for after-discharge care     Destination     [facility] [address] [place] [facility] Preferred SNF .   Service: Skilled Nursing Contact information: [address] [place] [address] [phone]                    No Known Allergies  Consultations: GI Nephro Cardio    Procedures/Studies: DG Abd Portable 1V  Result Date: 10/19/2022 CLINICAL DATA:  Abdominal distension EXAM: PORTABLE ABDOMEN - 1 VIEW COMPARISON:  10/17/2022 FINDINGS: Scattered large and small bowel gas is noted. No obstructive changes are seen. No free air is noted. 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 Degenerative changes of the thoracolumbar spine are noted. IMPRESSION: No acute abnormality noted. Electronically Signed   By: Alcide Clever M.D.   On: 10/19/2022 17:49   DG Abd 1 View  Result Date: 10/17/2022 CLINICAL DATA:  Abdominal distention EXAM: ABDOMEN -  1 VIEW COMPARISON:  None Available. FINDINGS: Bowel gas pattern is nonspecific. A moderate amount of gas is present in colon. There is no significant small bowel dilation. Stomach is not distended. Left hemidiaphragm is elevated. There is previous arthroplasty in both hips. Degenerative changes are noted in lumbar spine. IMPRESSION: Nonspecific bowel gas pattern. Moderate amount of gas and stool are present in right colon. There is no fecal impaction in rectosigmoid. Electronically Signed   By: Ernie Avena M.D.   On: 10/17/2022 20:32   US RENAL  Result Date: 10/15/2022 CLINICAL DATA:  Acute renal insufficiency. EXAM: RENAL / URINARY TRACT ULTRASOUND COMPLETE COMPARISON:  None Available. FINDINGS: Evaluation is very limited due to body habitus and overlying bowel gas. Right Kidney: Renal measurements: 12.2 x 6.9 x 6.6 cm = volume: 291 mL. Normal echogenicity. There is mild right hydronephrosis. No shadowing stone. Left Kidney: Renal measurements: 11.0 x 6.0 x 6.3 cm = volume: 213 mL. Normal echogenicity. Mild left hydronephrosis. No shadowing stone. Bladder: Appears normal for degree of bladder distention. Other: None. IMPRESSION: Mild bilateral hydronephrosis.  No shadowing stone. Electronically  Signed   By: Elgie Collard M.D.   On: 10/15/2022 21:23   ECHOCARDIOGRAM COMPLETE  Result Date: 10/14/2022    ECHOCARDIOGRAM REPORT   Patient Name:   BRAILYNN LAMATTINA Date of Exam: 10/14/2022 Medical Rec #:  [id number]    Height:       70.0 in Accession #:    [id number]   Weight:       222.9 lb Date of Birth:  1940/09/25     BSA:          2.186 m Patient Age:    82 years     BP:           Not listed in chart/Not listed in                                            chart mmHg Patient Gender: M            HR:           Not listed in chart bpm. Exam Location:  [facility] Procedure: 2D Echo, Cardiac Doppler and Color Doppler Indications:     Syncope R55  History:         Patient has prior history of Echocardiogram examinations, most                  recent 01/08/2021. Arrythmias:RBBB; Risk Factors:Hypertension                  and Dyslipidemia.  Sonographer:     Cristela Blue Referring Phys:  [id number] Andris Baumann Diagnosing Phys: Debbe Odea MD  Sonographer Comments: Technically challenging study due to limited acoustic windows, no apical window and no subcostal window. Pt left arm is in a sling--kept supine. IMPRESSIONS  1. Left ventricular ejection fraction, by estimation, is 60 to 65%. Left ventricular ejection fraction by PLAX is 60 %. The left ventricle has normal function. The left ventricle has no regional wall motion abnormalities. There is mild left ventricular hypertrophy. Left ventricular diastolic function could not be evaluated.  2. Right ventricular systolic function is normal. The right ventricular size is not well visualized.  3. The mitral valve is normal in structure. No evidence of mitral valve regurgitation.  4. The aortic valve is grossly  normal. Aortic valve regurgitation is not visualized. FINDINGS  Left Ventricle: Left ventricular ejection fraction, by estimation, is 60 to 65%. Left ventricular ejection fraction by PLAX is 60 %. The left ventricle has normal function. The left ventricle has no  regional wall motion abnormalities. The left ventricular internal cavity size was normal in size. There is mild left ventricular hypertrophy. Left ventricular diastolic function could not be evaluated. Right Ventricle: The right ventricular size is not well visualized. No increase in right ventricular wall thickness. Right ventricular systolic function is normal. Left Atrium: Left atrial size was normal in size. Right Atrium: Right atrial size was not well visualized. Pericardium: There is no evidence of pericardial effusion. Mitral Valve: The mitral valve is normal in structure. No evidence of mitral valve regurgitation. Tricuspid Valve: The tricuspid valve is not well visualized. Tricuspid valve regurgitation is not demonstrated. Aortic Valve: The aortic valve is grossly normal. Aortic valve regurgitation is not visualized. Pulmonic Valve: The pulmonic valve was not well visualized. Pulmonic valve regurgitation is not visualized. Aorta: The aortic root is normal in size and structure. Venous: The inferior vena cava was not well visualized. IAS/Shunts: No atrial level shunt detected by color flow Doppler.  LEFT VENTRICLE PLAX 2D LV EF:         Left ventricular ejection fraction by PLAX is 60 %. LVIDd:         3.80 cm LVIDs:         2.60 cm LV PW:         1.10 cm LV IVS:        1.10 cm LVOT diam:     2.10 cm LVOT Area:     3.46 cm  LEFT ATRIUM         Index LA diam:    2.40 cm 1.10 cm/m   AORTA Ao Root diam: 3.30 cm  SHUNTS Systemic Diam: 2.10 cm Debbe Odea MD Electronically signed by Debbe Odea MD Signature Date/Time: 10/14/2022/10:12:14 AM    Final    DG Chest Portable 1 View  Result Date: 10/13/2022 CLINICAL DATA:  Weakness EXAM: PORTABLE CHEST 1 VIEW COMPARISON:  Chest x-ray 08/18/2022 FINDINGS: The heart and mediastinal contours are within normal limits. No focal consolidation. No pulmonary edema. No pleural effusion. No pneumothorax. No acute osseous abnormality. Partially visualize reversed  left shoulder arthroplasty. Severe degenerative changes of the right glenohumeral joint. IMPRESSION: No active disease. Electronically Signed   By: Tish Frederickson M.D.   On: 10/13/2022 21:42   DG Shoulder Left Port  Result Date: 09/22/2022 CLINICAL DATA:  Status post reverse arthroplasty of left shoulder. EXAM: LEFT SHOULDER COMPARISON:  None. FINDINGS: Reverse left shoulder arthroplasty in expected alignment. No periprosthetic lucency or fracture. Recent postsurgical change includes air and edema in the soft tissues with skin staples in place. IMPRESSION: Reverse left shoulder arthroplasty without immediate postoperative complication. Electronically Signed   By: Narda Rutherford M.D.   On: 09/22/2022 16:09   [place] OR NERVE BLOCK-IMAGE ONLY [facility])  Result Date: 09/22/2022 There is no interpretation for this exam.  This order is for images obtained during a surgical procedure.  Please See "Surgeries" Tab for more information regarding the procedure.   (Echo, Carotid, EGD, Colonoscopy, ERCP)    Subjective: Pt c/o malaise    Discharge Exam: Vitals:   10/21/22 0634 10/21/22 0855  BP: (!) 148/78 (!) 155/90  Pulse: 78 86  Resp:  16  Temp: 97.6 F (36.4 C) (!) 97.5 F (36.4 C)  SpO2:  99% 100%   Vitals:   10/20/22 2019 10/21/22 0500 10/21/22 0634 10/21/22 0855  BP: (!) 146/91  (!) 148/78 (!) 155/90  Pulse: 83  78 86  Resp: 18   16  Temp: 97.8 F (36.6 C)  97.6 F (36.4 C) (!) 97.5 F (36.4 C)  TempSrc: Oral  Oral Oral  SpO2: 96%  99% 100%  Weight:  96.2 kg    Height:        General: Pt is alert, awake, not in acute distress Cardiovascular: irregularly irregular, no rubs, no gallops Respiratory: CTA bilaterally, no wheezing, no rhonchi Abdominal: Soft, NT, obese, bowel sounds + Extremities:  no cyanosis    The results of significant diagnostics from this hospitalization (including imaging, microbiology, ancillary and laboratory) are listed below for reference.      Microbiology: No results found for this or any previous visit (from the past 240 hour(s)).   Labs: BNP (last 3 results) No results for input(s): "BNP" in the last 8760 hours. Basic Metabolic Panel: Recent Labs  Lab 10/17/22 0340 10/18/22 0523 10/19/22 0512 10/20/22 0526 10/21/22 0900  NA 139 136 136 138 141  K 3.7 3.5 3.6 3.5 3.5  CL 104 103 105 101 104  CO2 25 26 26 24 24   GLUCOSE 105* 100* 102* 97 113*  BUN 41* 42* 44* 49* 54*  CREATININE 3.18* 3.28* 3.28* 3.29* 3.02*  CALCIUM 8.6* 8.4* 8.2* 8.8* 9.0  MG 2.3 2.2 2.4 2.6* 2.5*   Liver Function Tests: No results for input(s): "AST", "ALT", "ALKPHOS", "BILITOT", "PROT", "ALBUMIN" in the last 168 hours. No results for input(s): "LIPASE", "AMYLASE" in the last 168 hours. No results for input(s): "AMMONIA" in the last 168 hours. CBC: Recent Labs  Lab 10/17/22 0340 10/18/22 0523 10/19/22 0512 10/19/22 1245 10/20/22 0526 10/21/22 0900  WBC 8.2 6.9 7.6  --  6.7 7.3  HGB 13.1 12.1* 12.5* 12.6* 13.5 14.2  HCT 39.0 35.5* 36.8* 38.1* 40.7 43.0  MCV 92.4 92.0 92.5  --  93.1 94.3  PLT 299 241 260  --  277 270   Cardiac Enzymes: No results for input(s): "CKTOTAL", "CKMB", "CKMBINDEX", "TROPONINI" in the last 168 hours. BNP: Invalid input(s): "POCBNP" CBG: Recent Labs  Lab 10/18/22 0424 10/20/22 0438 10/20/22 0632 10/20/22 1655 10/21/22 0549  GLUCAP 98 71 82 136* 103*   D-Dimer No results for input(s): "DDIMER" in the last 72 hours. Hgb A1c No results for input(s): "HGBA1C" in the last 72 hours. Lipid Profile No results for input(s): "CHOL", "HDL", "LDLCALC", "TRIG", "CHOLHDL", "LDLDIRECT" in the last 72 hours. Thyroid function studies No results for input(s): "TSH", "T4TOTAL", "T3FREE", "THYROIDAB" in the last 72 hours.  Invalid input(s): "FREET3" Anemia work up No results for input(s): "VITAMINB12", "FOLATE", "FERRITIN", "TIBC", "IRON", "RETICCTPCT" in the last 72 hours. Urinalysis    Component Value  Date/Time   COLORURINE YELLOW (A) 10/18/2022 0940   APPEARANCEUR HAZY (A) 10/18/2022 0940   APPEARANCEUR Clear 02/12/2014 1357   LABSPEC 1.008 10/18/2022 0940   LABSPEC 1.018 02/12/2014 1357   PHURINE 6.0 10/18/2022 0940   GLUCOSEU NEGATIVE 10/18/2022 0940   GLUCOSEU Negative 02/12/2014 1357   HGBUR NEGATIVE 10/18/2022 0940   BILIRUBINUR NEGATIVE 10/18/2022 0940   BILIRUBINUR Negative 02/12/2014 1357   KETONESUR NEGATIVE 10/18/2022 0940   PROTEINUR NEGATIVE 10/18/2022 0940   NITRITE POSITIVE (A) 10/18/2022 0940   LEUKOCYTESUR NEGATIVE 10/18/2022 0940   LEUKOCYTESUR Negative 02/12/2014 1357   Sepsis Labs Recent Labs  Lab 10/18/22 0523 10/19/22 0512 10/20/22  0526 10/21/22 0900  WBC 6.9 7.6 6.7 7.3   Microbiology No results found for this or any previous visit (from the past 240 hour(s)).   Time coordinating discharge: Over 30 minutes  SIGNED:   Charise Killian, MD  [facility] 10/21/2022, 2:29 PM Pager   If 7PM-7AM, please contact night-coverage [email or web address]

## 2022-11-25 ENCOUNTER — Other Ambulatory Visit: Payer: Self-pay

## 2022-11-25 ENCOUNTER — Emergency Department: Payer: Medicare Other

## 2022-11-25 ENCOUNTER — Inpatient Hospital Stay
Admission: EM | Admit: 2022-11-25 | Discharge: 2022-12-03 | DRG: 871 | Disposition: A | Discharge status: Skilled Nursing Facility | Payer: Medicare Other | Attending: Internal Medicine | Admitting: Internal Medicine

## 2022-11-25 DIAGNOSIS — I495 Sick sinus syndrome: Secondary | ICD-10-CM | POA: Diagnosis present

## 2022-11-25 DIAGNOSIS — E860 Dehydration: Secondary | ICD-10-CM | POA: Diagnosis present

## 2022-11-25 DIAGNOSIS — Z8249 Family history of ischemic heart disease and other diseases of the circulatory system: Secondary | ICD-10-CM | POA: Diagnosis not present

## 2022-11-25 DIAGNOSIS — E8721 Acute metabolic acidosis: Secondary | ICD-10-CM | POA: Diagnosis present

## 2022-11-25 DIAGNOSIS — I700 Atherosclerosis of aorta: Secondary | ICD-10-CM | POA: Diagnosis present

## 2022-11-25 DIAGNOSIS — Z85828 Personal history of other malignant neoplasm of skin: Secondary | ICD-10-CM

## 2022-11-25 DIAGNOSIS — A419 Sepsis, unspecified organism: Secondary | ICD-10-CM | POA: Diagnosis not present

## 2022-11-25 DIAGNOSIS — D631 Anemia in chronic kidney disease: Secondary | ICD-10-CM | POA: Diagnosis present

## 2022-11-25 DIAGNOSIS — W19XXXA Unspecified fall, initial encounter: Secondary | ICD-10-CM | POA: Diagnosis not present

## 2022-11-25 DIAGNOSIS — K625 Hemorrhage of anus and rectum: Secondary | ICD-10-CM | POA: Diagnosis not present

## 2022-11-25 DIAGNOSIS — E876 Hypokalemia: Secondary | ICD-10-CM | POA: Diagnosis not present

## 2022-11-25 DIAGNOSIS — W010XXA Fall on same level from slipping, tripping and stumbling without subsequent striking against object, initial encounter: Secondary | ICD-10-CM | POA: Diagnosis present

## 2022-11-25 DIAGNOSIS — G9341 Metabolic encephalopathy: Secondary | ICD-10-CM | POA: Diagnosis not present

## 2022-11-25 DIAGNOSIS — Z96612 Presence of left artificial shoulder joint: Secondary | ICD-10-CM | POA: Diagnosis present

## 2022-11-25 DIAGNOSIS — Z7901 Long term (current) use of anticoagulants: Secondary | ICD-10-CM

## 2022-11-25 DIAGNOSIS — R42 Dizziness and giddiness: Secondary | ICD-10-CM

## 2022-11-25 DIAGNOSIS — D6832 Hemorrhagic disorder due to extrinsic circulating anticoagulants: Secondary | ICD-10-CM | POA: Diagnosis present

## 2022-11-25 DIAGNOSIS — N179 Acute kidney failure, unspecified: Secondary | ICD-10-CM | POA: Diagnosis present

## 2022-11-25 DIAGNOSIS — B9689 Other specified bacterial agents as the cause of diseases classified elsewhere: Secondary | ICD-10-CM | POA: Diagnosis present

## 2022-11-25 DIAGNOSIS — N390 Urinary tract infection, site not specified: Secondary | ICD-10-CM | POA: Diagnosis present

## 2022-11-25 DIAGNOSIS — F419 Anxiety disorder, unspecified: Secondary | ICD-10-CM | POA: Diagnosis present

## 2022-11-25 DIAGNOSIS — Y738 Miscellaneous gastroenterology and urology devices associated with adverse incidents, not elsewhere classified: Secondary | ICD-10-CM | POA: Diagnosis not present

## 2022-11-25 DIAGNOSIS — R55 Syncope and collapse: Secondary | ICD-10-CM

## 2022-11-25 DIAGNOSIS — N184 Chronic kidney disease, stage 4 (severe): Secondary | ICD-10-CM | POA: Diagnosis present

## 2022-11-25 DIAGNOSIS — N3000 Acute cystitis without hematuria: Secondary | ICD-10-CM

## 2022-11-25 DIAGNOSIS — N136 Pyonephrosis: Secondary | ICD-10-CM | POA: Diagnosis present

## 2022-11-25 DIAGNOSIS — I129 Hypertensive chronic kidney disease with stage 1 through stage 4 chronic kidney disease, or unspecified chronic kidney disease: Secondary | ICD-10-CM | POA: Diagnosis present

## 2022-11-25 DIAGNOSIS — I480 Paroxysmal atrial fibrillation: Secondary | ICD-10-CM | POA: Diagnosis present

## 2022-11-25 DIAGNOSIS — R296 Repeated falls: Secondary | ICD-10-CM | POA: Diagnosis present

## 2022-11-25 DIAGNOSIS — Y92009 Unspecified place in unspecified non-institutional (private) residence as the place of occurrence of the external cause: Secondary | ICD-10-CM

## 2022-11-25 DIAGNOSIS — K219 Gastro-esophageal reflux disease without esophagitis: Secondary | ICD-10-CM | POA: Diagnosis present

## 2022-11-25 DIAGNOSIS — Z96643 Presence of artificial hip joint, bilateral: Secondary | ICD-10-CM | POA: Diagnosis present

## 2022-11-25 DIAGNOSIS — R6520 Severe sepsis without septic shock: Secondary | ICD-10-CM | POA: Diagnosis present

## 2022-11-25 DIAGNOSIS — R310 Gross hematuria: Secondary | ICD-10-CM | POA: Diagnosis present

## 2022-11-25 DIAGNOSIS — I10 Essential (primary) hypertension: Secondary | ICD-10-CM | POA: Diagnosis not present

## 2022-11-25 DIAGNOSIS — E785 Hyperlipidemia, unspecified: Secondary | ICD-10-CM | POA: Diagnosis present

## 2022-11-25 DIAGNOSIS — R339 Retention of urine, unspecified: Secondary | ICD-10-CM

## 2022-11-25 DIAGNOSIS — N3289 Other specified disorders of bladder: Secondary | ICD-10-CM | POA: Diagnosis present

## 2022-11-25 DIAGNOSIS — T8383XA Hemorrhage of genitourinary prosthetic devices, implants and grafts, initial encounter: Secondary | ICD-10-CM | POA: Diagnosis not present

## 2022-11-25 DIAGNOSIS — N320 Bladder-neck obstruction: Secondary | ICD-10-CM | POA: Diagnosis present

## 2022-11-25 DIAGNOSIS — T45515A Adverse effect of anticoagulants, initial encounter: Secondary | ICD-10-CM | POA: Diagnosis present

## 2022-11-25 DIAGNOSIS — K921 Melena: Secondary | ICD-10-CM | POA: Diagnosis not present

## 2022-11-25 DIAGNOSIS — Z79899 Other long term (current) drug therapy: Secondary | ICD-10-CM

## 2022-11-25 DIAGNOSIS — B961 Klebsiella pneumoniae [K. pneumoniae] as the cause of diseases classified elsewhere: Secondary | ICD-10-CM | POA: Diagnosis present

## 2022-11-25 LAB — URINALYSIS, ROUTINE W REFLEX MICROSCOPIC
Bilirubin Urine: NEGATIVE
Glucose, UA: NEGATIVE mg/dL
Ketones, ur: NEGATIVE mg/dL
Nitrite: NEGATIVE
Protein, ur: 100 mg/dL — AB
Specific Gravity, Urine: 1.01 (ref 1.005–1.030)
Squamous Epithelial / HPF: NONE SEEN /HPF (ref 0–5)
WBC, UA: >50 WBC/hpf (ref 0–5)
pH: 7 (ref 5.0–8.0)

## 2022-11-25 LAB — CBC WITH DIFFERENTIAL/PLATELET
Abs Immature Granulocytes: 0.06 10*3/uL (ref 0.00–0.07)
Basophils Absolute: 0 10*3/uL (ref 0.0–0.1)
Basophils Relative: 0 %
Eosinophils Absolute: 0 10*3/uL (ref 0.0–0.5)
Eosinophils Relative: 0 %
HCT: 37 % — ABNORMAL LOW (ref 39.0–52.0)
Hemoglobin: 12.4 g/dL — ABNORMAL LOW (ref 13.0–17.0)
Immature Granulocytes: 1 %
Lymphocytes Relative: 8 %
Lymphs Abs: 1.1 10*3/uL (ref 0.7–4.0)
MCH: 31.1 pg (ref 26.0–34.0)
MCHC: 33.5 g/dL (ref 30.0–36.0)
MCV: 92.7 fL (ref 80.0–100.0)
Monocytes Absolute: 1.4 10*3/uL — ABNORMAL HIGH (ref 0.1–1.0)
Monocytes Relative: 11 %
Neutro Abs: 10.3 10*3/uL — ABNORMAL HIGH (ref 1.7–7.7)
Neutrophils Relative %: 80 %
Platelets: 275 10*3/uL (ref 150–400)
RBC: 3.99 MIL/uL — ABNORMAL LOW (ref 4.22–5.81)
RDW: 14.2 % (ref 11.5–15.5)
WBC: 12.8 10*3/uL — ABNORMAL HIGH (ref 4.0–10.5)
nRBC: 0 % (ref 0.0–0.2)

## 2022-11-25 LAB — COMPREHENSIVE METABOLIC PANEL
ALT: 16 U/L (ref 0–44)
AST: 16 U/L (ref 15–41)
Albumin: 3.5 g/dL (ref 3.5–5.0)
Alkaline Phosphatase: 54 U/L (ref 38–126)
Anion gap: 13 (ref 5–15)
BUN: 108 mg/dL — ABNORMAL HIGH (ref 8–23)
CO2: 20 mmol/L — ABNORMAL LOW (ref 22–32)
Calcium: 9.2 mg/dL (ref 8.9–10.3)
Chloride: 99 mmol/L (ref 98–111)
Creatinine, Ser: 8.08 mg/dL — ABNORMAL HIGH (ref 0.61–1.24)
GFR, Estimated: 6 mL/min — ABNORMAL LOW (ref >60)
Glucose, Bld: 138 mg/dL — ABNORMAL HIGH (ref 70–99)
Potassium: 4.6 mmol/L (ref 3.5–5.1)
Sodium: 132 mmol/L — ABNORMAL LOW (ref 135–145)
Total Bilirubin: 1 mg/dL (ref 0.3–1.2)
Total Protein: 7.7 g/dL (ref 6.5–8.1)

## 2022-11-25 LAB — MAGNESIUM: Magnesium: 2.4 mg/dL (ref 1.7–2.4)

## 2022-11-25 MED ORDER — PIPERACILLIN-TAZOBACTAM IN DEX 2-0.25 GM/50ML IV SOLN
2.2500 g | Freq: Three times a day (TID) | INTRAVENOUS | Status: DC
  Administered 2022-11-25 – 2022-11-28 (×9): 2.25 g via INTRAVENOUS
  Filled 2022-11-25 (×10): qty 50

## 2022-11-25 MED ORDER — LACTATED RINGERS IV BOLUS
500.0000 mL | Freq: Once | INTRAVENOUS | Status: AC
  Administered 2022-11-25: 500 mL via INTRAVENOUS

## 2022-11-25 MED ORDER — SODIUM CHLORIDE 0.9% FLUSH
3.0000 mL | Freq: Two times a day (BID) | INTRAVENOUS | Status: DC
  Administered 2022-11-25 – 2022-12-03 (×16): 3 mL via INTRAVENOUS

## 2022-11-25 MED ORDER — LACTATED RINGERS IV SOLN: INTRAVENOUS | Status: DC

## 2022-11-25 MED ORDER — ACETAMINOPHEN 325 MG PO TABS
650.0000 mg | ORAL_TABLET | Freq: Four times a day (QID) | ORAL | Status: DC | PRN
  Administered 2022-11-26: 650 mg via ORAL
  Filled 2022-11-25: qty 2

## 2022-11-25 MED ORDER — MORPHINE SULFATE (PF) 2 MG/ML IV SOLN: 2.0000 mg | INTRAVENOUS | Status: DC | PRN

## 2022-11-25 MED ORDER — SODIUM CHLORIDE 0.9 % IV SOLN
1.0000 g | Freq: Once | INTRAVENOUS | Status: AC
  Administered 2022-11-25: 1 g via INTRAVENOUS
  Filled 2022-11-25: qty 10

## 2022-11-25 MED ORDER — SODIUM CHLORIDE 0.9 % IV BOLUS
1000.0000 mL | Freq: Once | INTRAVENOUS | Status: AC
  Administered 2022-11-25: 1000 mL via INTRAVENOUS

## 2022-11-25 MED ORDER — ACETAMINOPHEN 650 MG RE SUPP: 650.0000 mg | Freq: Four times a day (QID) | RECTAL | Status: DC | PRN

## 2022-11-25 NOTE — ED Provider Notes (Signed)
[facility] Provider Note    Event Date/Time   First MD Initiated Contact with Patient 11/25/22 1919     (approximate)   History   Chief Complaint Fall   HPI  Steven Welch is a 82 y.o. male with past medical history of hypertension, sick sinus syndrome, and atrial fibrillation on Eliquis who presents to the ED complaining of fall.  Wife at bedside reports that patient has been increasingly weak over the past 7 to 14 days along with increasing confusion.  Earlier today, he attempted to get into his recliner, lost his balance and fell.  Wife is unsure whether he hit his head or lost consciousness.     Physical Exam   Triage Vital Signs: ED Triage Vitals  Encounter Vitals Group     BP 11/25/22 1540 122/81     Systolic BP Percentile --      Diastolic BP Percentile --      Pulse Rate 11/25/22 1540 63     Resp 11/25/22 1540 16     Temp 11/25/22 1540 98.7 F (37.1 C)     Temp Source 11/25/22 1540 Oral     SpO2 11/25/22 1540 97 %     Weight --      Height --      Head Circumference --      Peak Flow --      Pain Score 11/25/22 1544 6     Pain Loc --      Pain Education --      Exclude from Growth Chart --     Most recent vital signs: Vitals:   11/25/22 2020 11/25/22 2030  BP: (!) 165/88 (!) 158/79  Pulse:    Resp:  19  Temp:    SpO2:      Constitutional: Alert and oriented to person, but not place, time, or situation. Eyes: Conjunctivae are normal. Head: Atraumatic. Neck: No midline cervical spine tenderness to palpation. Nose: No congestion/rhinnorhea. Mouth/Throat: Mucous membranes are moist.  Cardiovascular: Normal rate, regular rhythm. Grossly normal heart sounds.  2+ radial pulses bilaterally. Respiratory: Normal respiratory effort.  No retractions. Lungs CTAB. Gastrointestinal: Soft with firm and distended appearing bladder which is tender to palpation. Musculoskeletal: No lower extremity tenderness nor edema.  Neurologic:   Normal speech and language. No gross focal neurologic deficits are appreciated.    ED Results / Procedures / Treatments   Labs (all labs ordered are listed, but only abnormal results are displayed) Labs Reviewed  CBC WITH DIFFERENTIAL/PLATELET - Abnormal; Notable for the following components:      Result Value   WBC 12.8 (*)    RBC 3.99 (*)    Hemoglobin 12.4 (*)    HCT 37.0 (*)    Neutro Abs 10.3 (*)    Monocytes Absolute 1.4 (*)    All other components within normal limits  COMPREHENSIVE METABOLIC PANEL - Abnormal; Notable for the following components:   Sodium 132 (*)    CO2 20 (*)    Glucose, Bld 138 (*)    BUN 108 (*)    Creatinine, Ser 8.08 (*)    GFR, Estimated 6 (*)    All other components within normal limits  URINALYSIS, ROUTINE W REFLEX MICROSCOPIC - Abnormal; Notable for the following components:   Color, Urine YELLOW (*)    APPearance TURBID (*)    Hgb urine dipstick MODERATE (*)    Protein, ur 100 (*)    Leukocytes,Ua LARGE (*)  Bacteria, UA FEW (*)    All other components within normal limits  URINE CULTURE     EKG  ED ECG REPORT I, Chesley Noon, the attending physician, personally viewed and interpreted this ECG.   Date: 11/25/2022  EKG Time: 15:55  Rate: 54  Rhythm: sinus bradycardia  Axis: Normal  Intervals:right bundle branch block  ST&T Change: Inferior T wave inversions  RADIOLOGY CT head reviewed and interpreted by me with no hemorrhage or midline shift.  PROCEDURES:  Critical Care performed: Yes, see critical care procedure note(s)  .Critical Care  Performed by: Chesley Noon, MD Authorized by: Chesley Noon, MD   Critical care provider statement:    Critical care time (minutes):  30   Critical care time was exclusive of:  Separately billable procedures and treating other patients and teaching time   Critical care was necessary to treat or prevent imminent or life-threatening deterioration of the following conditions:   Renal failure   Critical care was time spent personally by me on the following activities:  Development of treatment plan with patient or surrogate, discussions with consultants, evaluation of patient's response to treatment, examination of patient, ordering and review of laboratory studies, ordering and review of radiographic studies, ordering and performing treatments and interventions, pulse oximetry, re-evaluation of patient's condition and review of old charts   I assumed direction of critical care for this patient from another provider in my specialty: no     Care discussed with: admitting provider      MEDICATIONS ORDERED IN ED: Medications  cefTRIAXone (ROCEPHIN) 1 g in sodium chloride 0.9 % 100 mL IVPB (has no administration in time range)  sodium chloride 0.9 % bolus 1,000 mL (1,000 mLs Intravenous New Bag/Given 11/25/22 2018)     IMPRESSION / MDM / ASSESSMENT AND PLAN / ED COURSE  I reviewed the triage vital signs and the nursing notes.                              82 y.o. male with past medical history of hypertension, sick sinus syndrome, and atrial fibrillation on Eliquis who presents to the ED complaining of increasing confusion and generalized weakness for the past 7 to 14 days, now with a fall earlier today.  Patient's presentation is most consistent with acute presentation with potential threat to life or bodily function.  Differential diagnosis includes, but is not limited to, intracranial injury, cervical spine injury, UTI, electrolyte abnormality, AKI, anemia, stroke.  Patient chronically ill-appearing but nontoxic and in no acute distress, vital signs are unremarkable.  He has not oriented only to self with no focal neurologic deficits on exam.  CT imaging of his head and cervical spine is negative for traumatic injury, no evidence of injury to his trunk or extremities.  He does have significant AKI noted on labs with BUN greater than 100 and creatinine greater than 8  compared to baseline creatinine of around 3.  I am concerned that he is retaining urine given his distended and firm bladder, will check bladder scan and anticipate need for Foley catheter. Also plan to check renal US.  Bladder scan showed greater than 1 L of urine in the bladder, Foley catheter placed and urinalysis concerning for UTI.  Urine was sent for culture and we will start on IV Rocephin.  Case discussed with hospitalist for admission.      FINAL CLINICAL IMPRESSION(S) / ED DIAGNOSES   Final diagnoses:  AKI (acute kidney injury) (HCC)  Urinary retention  Acute cystitis without hematuria  Fall, initial encounter     Rx / DC Orders   ED Discharge Orders     None        Note:  This document was prepared using Dragon voice recognition software and may include unintentional dictation errors.   Chesley Noon, MD 11/25/22 2110

## 2022-11-25 NOTE — Assessment & Plan Note (Addendum)
Due to dehydration and infection and weakness from both.  Fall precaution/ aspiration precaution. PT consult per am team once infection clears and pt stabilizes.

## 2022-11-25 NOTE — ED Notes (Signed)
Pt's urine changed to bloody urine when Dr. Allena Katz in room.

## 2022-11-25 NOTE — H&P (Addendum)
History and Physical    Patient: Steven Welch JYN:[id number] DOB: November 02, 1940 DOA: 11/25/2022 DOS: the patient was seen and examined on 11/25/2022 PCP: Dorothey Baseman, MD  Patient coming from: Home   Chief Complaint:  Chief Complaint  Patient presents with   Fall   HPI: Steven Welch is a 82 y.o. male with medical history significant for male with medical history significant for paroxysmal atrial fibrillation on Eliquis, sick sinus syndrome, hypertension, frequent falls, last seen by cardiology 08/2022 who is s/p left shoulder surgery who presents following a fall at home from standing position and striking his head while trying to get in  the recliner.  On initial presentation wife was at bedside speaking to the ED doctor.  Reports of increasing weakness along with confusion over the past few weeks since his discharge.  In the emergency room patient has hard of hearing, is awake cooperative follows commands is weak on exam. Initial EKG shows sinus bradycardia with a right bundle branch block and inferior T wave inversions, head CT was negative for any acute bleed. CT's cervical spine is also negative for any traumatic injuries. Blood work shows hyponatremia of 132 glucose 138 acute kidney injury of 8.08 EGFR of 6.  Leukocytosis of 12.8 mild anemia of 12.4 normal platelet count of 275.  In the emergency room indwelling Foley was placed which yielded 1300 cc of purulent urine which eventually changed into gross hematuria and blood.  Urology consulted and agrees with plan of indwelling Foley no current need of CBI and they will consult patient tomorrow.  Discussed case with Dr. Alvester Morin and consult is placed.  In the emergency room patient was given Rocephin along with 1 L normal saline and 500 cc of LR bolus. Patient does meet sepsis criteria in the emergency room  Review of Systems: Review of Systems  Reason unable to perform ROS: hearing imparied/ age and dementia.    Past Medical History:   Diagnosis Date   Anxiety    Aortic atherosclerosis (HCC)    Asymptomatic bradycardia    Cyst of breast, right 1981   Gastric ulcer    GERD (gastroesophageal reflux disease)    Hepatitis 1988   History of shingles    Hyperlipidemia    Hypertension    Long term current use of anticoagulant    a.) apixaban   Osteoarthritis    PAF (paroxysmal atrial fibrillation) (HCC)    a.) CHA2DS2VASc = 4 (age x2, HTN, vascular disease history);  b.) rate/rhythm maintained on oral metoprolol tartrate; chronically anticoagulated with apixaban   RBBB (right bundle branch block)    Recurrent falls    Skin cancer of face    SSS (sick sinus syndrome) (HCC)    Past Surgical History:  Procedure Laterality Date   BREAST EXCISIONAL BIOPSY Right 1981   neg   COLON SURGERY     age 70 month old   REVERSE SHOULDER ARTHROPLASTY Left 09/22/2022   Procedure: REVERSE SHOULDER ARTHROPLASTY with BICEPS TENODESIS;  Surgeon: Christena Flake, MD;  Location: [facility] ORS;  Service: Orthopedics;  Laterality: Left;   TONSILLECTOMY     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   TOTAL HIP ARTHROPLASTY Right 01/01/2019   Procedure: TOTAL HIP ARTHROPLASTY ANTERIOR APPROACH;  Surgeon: Kennedy Bucker, MD;  Location: [facility] ORS;  Service: Orthopedics;  Laterality: Right;   Social History:   reports that he has never smoked. He has never used smokeless tobacco. He reports that he does not currently use alcohol. He reports that he  does not use drugs.  No Known Allergies  Family History  Problem Relation Age of Onset   CAD Mother    Lung cancer Father     Prior to Admission medications   Medication Sig Start Date End Date Taking? Authorizing Provider  acetaminophen (TYLENOL) 500 MG tablet Take 2 tablets (1,000 mg total) by mouth every 6 (six) hours as needed (pain). 09/26/22   Anson Oregon, PA-C  apixaban (ELIQUIS) 2.5 MG TABS tablet Take 1 tablet (2.5 mg total) by mouth 2 (two) times daily. 10/21/22 11/20/22  Charise Killian, MD  hydrALAZINE (APRESOLINE) 25 MG tablet Take 1 tablet by mouth 3 (three) times daily. 12/28/21   [provider]  hydrochlorothiazide (HYDRODIURIL) 25 MG tablet Take 25 mg by mouth daily.    [provider]  lidocaine (LIDODERM) 5 % Place 1 patch onto the skin every 12 (twelve) hours. Remove & Discard patch within 12 hours or as directed by MD Patient not taking: Reported on 10/14/2022 08/18/22 08/18/23  Delton Prairie, MD  melatonin 3 MG TABS tablet Take 6 mg by mouth at bedtime. 10/07/22   [provider]  Menthol, Topical Analgesic, (ICY HOT ORIGINAL PAIN RELIEF) 2.5 % GEL Apply topically as needed.    [provider]  metoprolol succinate (TOPROL-XL) 25 MG 24 hr tablet Take 0.5 tablets (12.5 mg total) by mouth daily. 10/22/22 11/21/22  Charise Killian, MD  ondansetron (ZOFRAN) 4 MG tablet Take 1 tablet (4 mg total) by mouth every 6 (six) hours as needed for nausea. 09/23/22   Anson Oregon, PA-C     Vitals:   11/25/22 2100 11/25/22 2130 11/25/22 2200 11/25/22 2231  BP: (!) 155/74 (!) 170/105 (!) 148/84   Pulse:  97 85   Resp:  18 19   Temp:      TempSrc:      SpO2:  97% 97%   Weight:    96.2 kg   Physical Exam Vitals and nursing note reviewed.  Constitutional:      General: He is not in acute distress. HENT:     Head: Normocephalic and atraumatic.     Right Ear: Hearing and external ear normal.     Left Ear: Hearing and external ear normal.     Nose: Nose normal. No nasal deformity.     Mouth/Throat:     Lips: Pink.     Tongue: No lesions.     Pharynx: Oropharynx is clear.  Eyes:     General: Lids are normal.     Extraocular Movements: Extraocular movements intact.  Cardiovascular:     Rate and Rhythm: Normal rate and regular rhythm.     Heart sounds: Normal heart sounds.  Pulmonary:     Effort: Pulmonary effort is normal.     Breath sounds: Normal breath sounds.  Abdominal:     General: Bowel sounds are normal. There is no  distension.     Palpations: Abdomen is soft. There is no mass.     Tenderness: There is no abdominal tenderness.  Musculoskeletal:     Right lower leg: No edema.     Left lower leg: No edema.  Skin:    General: Skin is warm.  Neurological:     General: No focal deficit present.     Mental Status: He is alert and oriented to person, place, and time.     Cranial Nerves: Cranial nerves 2-12 are intact.     Motor: Weakness present.  Psychiatric:        Attention and Perception: Attention normal.        Mood and Affect: Mood normal.        Speech: Speech normal.        Behavior: Behavior normal. Behavior is cooperative.    Labs on Admission: I have personally reviewed following labs and imaging studies  CBC: Recent Labs  Lab 11/25/22 1546  WBC 12.8*  NEUTROABS 10.3*  HGB 12.4*  HCT 37.0*  MCV 92.7  PLT 275   Basic Metabolic Panel: Recent Labs  Lab 11/25/22 1546  NA 132*  K 4.6  CL 99  CO2 20*  GLUCOSE 138*  BUN 108*  CREATININE 8.08*  CALCIUM 9.2  MG 2.4   GFR: Estimated Creatinine Clearance: 8.3 mL/min (A) (by C-G formula based on SCr of 8.08 mg/dL (H)). Liver Function Tests: Recent Labs  Lab 11/25/22 1546  AST 16  ALT 16  ALKPHOS 54  BILITOT 1.0  PROT 7.7  ALBUMIN 3.5   No results for input(s): "LIPASE", "AMYLASE" in the last 168 hours. No results for input(s): "AMMONIA" in the last 168 hours. Coagulation Profile: No results for input(s): "INR", "PROTIME" in the last 168 hours. Cardiac Enzymes: No results for input(s): "CKTOTAL", "CKMB", "CKMBINDEX", "TROPONINI" in the last 168 hours. BNP (last 3 results) No results for input(s): "PROBNP" in the last 8760 hours. HbA1C: No results for input(s): "HGBA1C" in the last 72 hours. CBG: No results for input(s): "GLUCAP" in the last 168 hours. Lipid Profile: No results for input(s): "CHOL", "HDL", "LDLCALC", "TRIG", "CHOLHDL", "LDLDIRECT" in the last 72 hours. Thyroid Function Tests: No results for  input(s): "TSH", "T4TOTAL", "FREET4", "T3FREE", "THYROIDAB" in the last 72 hours. Anemia Panel: No results for input(s): "VITAMINB12", "FOLATE", "FERRITIN", "TIBC", "IRON", "RETICCTPCT" in the last 72 hours. Urinalysis    Component Value Date/Time   COLORURINE YELLOW (A) 11/25/2022 2014   APPEARANCEUR TURBID (A) 11/25/2022 2014   APPEARANCEUR Clear 02/12/2014 1357   LABSPEC 1.010 11/25/2022 2014   LABSPEC 1.018 02/12/2014 1357   PHURINE 7.0 11/25/2022 2014   GLUCOSEU NEGATIVE 11/25/2022 2014   GLUCOSEU Negative 02/12/2014 1357   HGBUR MODERATE (A) 11/25/2022 2014   BILIRUBINUR NEGATIVE 11/25/2022 2014   BILIRUBINUR Negative 02/12/2014 1357   KETONESUR NEGATIVE 11/25/2022 2014   PROTEINUR 100 (A) 11/25/2022 2014   NITRITE NEGATIVE 11/25/2022 2014   LEUKOCYTESUR LARGE (A) 11/25/2022 2014   LEUKOCYTESUR Negative 02/12/2014 1357   Unresulted Labs (From admission, onward)     Start     Ordered   11/26/22 0500  Comprehensive metabolic panel  Tomorrow morning,   R        11/25/22 2227   11/26/22 0500  CBC  Tomorrow morning,   R        11/25/22 2227   11/25/22 2209  Lactic acid, plasma  (Lactic Acid)  ONCE - STAT,   STAT        11/25/22 2208   11/25/22 2209  Culture, blood (Routine X 2) w Reflex to ID Panel  BLOOD CULTURE X 2,   STAT      11/25/22 2208   11/25/22 2058  Urine Culture  Add-on,   AD       Question:  Indication  Answer:  Suprapubic pain   11/25/22 2057           Medications  sodium chloride flush (NS) 0.9 % injection 3 mL (3 mLs Intravenous Given 11/25/22 2231)  acetaminophen (TYLENOL) tablet 650  mg (has no administration in time range)    Or  acetaminophen (TYLENOL) suppository 650 mg (has no administration in time range)  morphine (PF) 2 MG/ML injection 2 mg (has no administration in time range)  lactated ringers bolus 500 mL (has no administration in time range)  lactated ringers infusion (has no administration in time range)  sodium chloride 0.9 % bolus  1,000 mL (1,000 mLs Intravenous New Bag/Given 11/25/22 2018)  cefTRIAXone (ROCEPHIN) 1 g in sodium chloride 0.9 % 100 mL IVPB (0 g Intravenous Stopped 11/25/22 2149)    Radiological Exams on Admission: CT ABDOMEN PELVIS WO CONTRAST  Result Date: 11/25/2022 CLINICAL DATA:  Kidney failure, acute hematuria EXAM: CT ABDOMEN AND PELVIS WITHOUT CONTRAST TECHNIQUE: Multidetector CT imaging of the abdomen and pelvis was performed following the standard protocol without IV contrast. RADIATION DOSE REDUCTION: This exam was performed according to the departmental dose-optimization program which includes automated exposure control, adjustment of the mA and/or kV according to patient size and/or use of iterative reconstruction technique. COMPARISON:  Renal ultrasound 11/25/2022 FINDINGS: Lower chest: Small right pleural effusion. There is pleural thickening about the right pleural effusion. Trace left pleural effusion. Bibasilar airspace opacities. Findings are similar to CT chest 04/09/2021. Hepatobiliary: Unremarkable noncontrast appearance of the liver, gallbladder, and biliary tree. Pancreas: Unremarkable. Spleen: Unremarkable. Adrenals/Urinary Tract: Normal adrenal glands. Moderate right and mild left hydroureteronephrosis. No urinary calculi. Nonspecific perinephric stranding bilaterally. Foley catheter within the bladder which is poorly evaluated due to streak artifact. Question wall thickening and perivesical stranding. There are some locules of gas either within the bladder lumen or wall. Stomach/Bowel: Normal caliber large and small bowel. Colonic diverticulosis without diverticulitis. The appendix is not definitively visualized. Stomach is within normal limits other than a small hiatal hernia. Vascular/Lymphatic: Aortic atherosclerosis. No enlarged abdominal or pelvic lymph nodes. Reproductive: Not well evaluated due to streak artifact. No evidence of adnexal mass. Other: Small volume free fluid in the pelvis. No  free intraperitoneal air. Musculoskeletal: No acute fracture.  Bilateral THA is. IMPRESSION: 1. Moderate right and mild left hydroureteronephrosis. No urinary calculi. 2. Foley catheter within the bladder. The bladder is not well evaluated due to streak artifact. There may be some bladder wall thickening and perivesical stranding. Locules of gas either within the bladder lumen or wall. Correlate for cystitis. 3. Small right pleural effusion with pleural thickening similar to CT chest 04/09/2021. Aortic Atherosclerosis (ICD10-I70.0). Electronically Signed   By: Minerva Fester M.D.   On: 11/25/2022 22:47   US Renal  Result Date: 11/25/2022 CLINICAL DATA:  Acute kidney injury EXAM: RENAL / URINARY TRACT ULTRASOUND COMPLETE COMPARISON:  None Available. FINDINGS: Right Kidney: Renal measurements: 11.7 x 5.7 x 6.7 cm = volume: 233 mL. Mild right hydronephrosis. No mass. Normal echotexture. Left Kidney: Renal measurements: 11.5 x 5.8 x 5.3 cm = volume: 183 mL. Mild hydronephrosis. No mass. Normal echotexture. Bladder: Partially decompressed with Foley catheter in in place. Layering debris. Other: None. IMPRESSION: Mild bilateral hydronephrosis. Layering debris within the urinary bladder. Electronically Signed   By: Charlett Nose M.D.   On: 11/25/2022 20:34   CT Head Wo Contrast  Result Date: 11/25/2022 CLINICAL DATA:  Fall, head injury EXAM: CT HEAD WITHOUT CONTRAST CT CERVICAL SPINE WITHOUT CONTRAST TECHNIQUE: Multidetector CT imaging of the head and cervical spine was performed following the standard protocol without intravenous contrast. Multiplanar CT image reconstructions of the cervical spine were also generated. RADIATION DOSE REDUCTION: This exam was performed according to the departmental dose-optimization  program which includes automated exposure control, adjustment of the mA and/or kV according to patient size and/or use of iterative reconstruction technique. COMPARISON:  CT head dated 08/17/2022. CT  cervical spine dated 03/08/2022. FINDINGS: CT HEAD FINDINGS Brain: No evidence of acute infarction, hemorrhage, hydrocephalus, extra-axial collection or mass lesion/mass effect. Global cortical and central atrophy, temporal lobe predominant. Extensive subcortical white matter and periventricular small vessel ischemic changes. Vascular: No hyperdense vessel or unexpected calcification. Skull: Normal. Negative for fracture or focal lesion. Sinuses/Orbits: The visualized paranasal sinuses are essentially clear. The mastoid air cells are unopacified. Other: None. CT CERVICAL SPINE FINDINGS Alignment: Normal cervical lordosis. Skull base and vertebrae: No acute fracture. No primary bone lesion or focal pathologic process. Soft tissues and spinal canal: No prevertebral fluid or swelling. No visible canal hematoma. Disc levels: Mild degenerative changes of the mid cervical spine. Spinal canal is patent. Upper chest: Visualized lung apices are clear. Other: Visualized thyroid is unremarkable. IMPRESSION: No acute intracranial abnormality. Atrophy with small vessel ischemic changes. No traumatic injury to the cervical spine. Mild degenerative changes. Electronically Signed   By: Charline Bills M.D.   On: 11/25/2022 16:20   CT CERVICAL SPINE WO CONTRAST  Result Date: 11/25/2022 CLINICAL DATA:  Fall, head injury EXAM: CT HEAD WITHOUT CONTRAST CT CERVICAL SPINE WITHOUT CONTRAST TECHNIQUE: Multidetector CT imaging of the head and cervical spine was performed following the standard protocol without intravenous contrast. Multiplanar CT image reconstructions of the cervical spine were also generated. RADIATION DOSE REDUCTION: This exam was performed according to the departmental dose-optimization program which includes automated exposure control, adjustment of the mA and/or kV according to patient size and/or use of iterative reconstruction technique. COMPARISON:  CT head dated 08/17/2022. CT cervical spine dated 03/08/2022.  FINDINGS: CT HEAD FINDINGS Brain: No evidence of acute infarction, hemorrhage, hydrocephalus, extra-axial collection or mass lesion/mass effect. Global cortical and central atrophy, temporal lobe predominant. Extensive subcortical white matter and periventricular small vessel ischemic changes. Vascular: No hyperdense vessel or unexpected calcification. Skull: Normal. Negative for fracture or focal lesion. Sinuses/Orbits: The visualized paranasal sinuses are essentially clear. The mastoid air cells are unopacified. Other: None. CT CERVICAL SPINE FINDINGS Alignment: Normal cervical lordosis. Skull base and vertebrae: No acute fracture. No primary bone lesion or focal pathologic process. Soft tissues and spinal canal: No prevertebral fluid or swelling. No visible canal hematoma. Disc levels: Mild degenerative changes of the mid cervical spine. Spinal canal is patent. Upper chest: Visualized lung apices are clear. Other: Visualized thyroid is unremarkable. IMPRESSION: No acute intracranial abnormality. Atrophy with small vessel ischemic changes. No traumatic injury to the cervical spine. Mild degenerative changes. Electronically Signed   By: Charline Bills M.D.   On: 11/25/2022 16:20    Data Reviewed: Relevant notes from primary care and specialist visits, past discharge summaries as available in EHR, including Care Everywhere. Prior diagnostic testing as pertinent to current admission diagnoses Updated medications and problem lists for reconciliation ED course, including vitals, labs, imaging, treatment and response to treatment Triage notes, nursing and pharmacy notes and ED provider's notes Notable results as noted in HPI  Assessment and Plan: * Fall Due to dehydration and infection and weakness from both.  Fall precaution/ aspiration precaution. PT consult per am team once infection clears and pt stabilizes.  PAF (paroxysmal atrial fibrillation) (HCC) Currently on eliquis held due to gross  hematuria.    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 We will obtain a CT abdomen and pelvis noncontrast for evaluation further. Nephrology consult per a.m. team.  Urology already consulted.  Sepsis secondary to UTI [facility]) Will start renal dose zosyn.    Gross hematuria Hold eliquis. Type/screen. Resume in am or tomorrow PM if H/H drops or per am team.   Gastro-esophageal reflux disease without esophagitis IV PPI.  Type/ screen   Hypertension Vitals:   11/25/22 1540 11/25/22 1930 11/25/22 2020 11/25/22 2030  BP: 122/81 (!) 156/89 (!) 165/88 (!) 158/79   11/25/22 2100 11/25/22 2130 11/25/22 2200  BP: (!) 155/74 (!) 170/105 (!) 148/84  Will cont metoprolol. Hold diuretics.    Sepsis [facility]) Patient meets sepsis criteria in the emergency room. Cultures obtained and antibiotic changed to broader coverage. Will start IV ABX.     DVT prophylaxis:  SCD's  Consults:  Urology.   Advance Care Planning:    Code Status: Full Code   Family Communication:  None   Disposition Plan:  Home   Severity of Illness: The appropriate patient status for this patient is INPATIENT. Inpatient status is judged to be reasonable and necessary in order to provide the required intensity of service to ensure the patient's safety. The patient's presenting symptoms, physical exam findings, and initial radiographic and laboratory data in the context of their chronic comorbidities is felt to place them at high risk for further clinical deterioration. Furthermore, it is not anticipated that the patient will be medically stable for discharge from the hospital within 2 midnights of admission.   * I certify that at the point of admission it is my clinical judgment that the patient will require inpatient hospital care  spanning beyond 2 midnights from the point of admission due to high intensity of service, high risk for further deterioration and high frequency of surveillance required.*  Author: Gertha Calkin, MD 11/25/2022 10:55 PM  For on call review [email or web address].

## 2022-11-25 NOTE — Assessment & Plan Note (Signed)
Currently on eliquis held due to gross hematuria.

## 2022-11-25 NOTE — Assessment & Plan Note (Signed)
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 We will obtain a CT abdomen and pelvis noncontrast for evaluation further. Nephrology consult per a.m. team.  Urology already consulted.

## 2022-11-25 NOTE — ED Notes (Signed)
Pt was bladder scanned for over a liter of fluid in bladder.  A urinary cath was placed. Dr. Larinda Buttery aware. Pt has vomited x 2 and has been changed and cleaned up. Pt fought during urinary catheter placement.  Prior to placement of catheter, abd was taught and distended now abd is soft. So far two liters of cloudy urine have been obtained and more cont to drain

## 2022-11-25 NOTE — Assessment & Plan Note (Signed)
Will start renal dose zosyn.

## 2022-11-25 NOTE — Assessment & Plan Note (Signed)
Vitals:   11/25/22 1540 11/25/22 1930 11/25/22 2020 11/25/22 2030  BP: 122/81 (!) 156/89 (!) 165/88 (!) 158/79   11/25/22 2100 11/25/22 2130 11/25/22 2200  BP: (!) 155/74 (!) 170/105 (!) 148/84  Will cont metoprolol. Hold diuretics.

## 2022-11-25 NOTE — ED Triage Notes (Signed)
Arrives from home via [facility].   Fall.  Hit head.  Fell from a standing position when getting into recliner chair.  Has had an onset of generalized weakness and wife reports patient being more altered since yesterday.  VS wnl.  AxO x 2.  Confused to time, thinks it is July.

## 2022-11-25 NOTE — Assessment & Plan Note (Signed)
IV PPI.  Type/ screen

## 2022-11-25 NOTE — Assessment & Plan Note (Signed)
Hold eliquis. Type/screen. Resume in am or tomorrow PM if H/H drops or per am team.

## 2022-11-25 NOTE — Assessment & Plan Note (Signed)
Patient meets sepsis criteria in the emergency room. Cultures obtained and antibiotic changed to broader coverage. Will start IV ABX.

## 2022-11-26 DIAGNOSIS — W19XXXA Unspecified fall, initial encounter: Secondary | ICD-10-CM | POA: Diagnosis not present

## 2022-11-26 DIAGNOSIS — R55 Syncope and collapse: Secondary | ICD-10-CM | POA: Diagnosis not present

## 2022-11-26 DIAGNOSIS — R42 Dizziness and giddiness: Secondary | ICD-10-CM | POA: Diagnosis not present

## 2022-11-26 LAB — GASTROINTESTINAL PANEL BY PCR, STOOL (REPLACES STOOL CULTURE)

## 2022-11-26 LAB — BASIC METABOLIC PANEL
Anion gap: 13 (ref 5–15)
BUN: 116 mg/dL — ABNORMAL HIGH (ref 8–23)
CO2: 20 mmol/L — ABNORMAL LOW (ref 22–32)
Calcium: 8.5 mg/dL — ABNORMAL LOW (ref 8.9–10.3)
Chloride: 103 mmol/L (ref 98–111)
Creatinine, Ser: 8.01 mg/dL — ABNORMAL HIGH (ref 0.61–1.24)
GFR, Estimated: 6 mL/min — ABNORMAL LOW (ref >60)
Glucose, Bld: 118 mg/dL — ABNORMAL HIGH (ref 70–99)
Potassium: 4.7 mmol/L (ref 3.5–5.1)
Sodium: 136 mmol/L (ref 135–145)

## 2022-11-26 LAB — CBC
HCT: 39.5 % (ref 39.0–52.0)
Hemoglobin: 13.1 g/dL (ref 13.0–17.0)
MCH: 31.1 pg (ref 26.0–34.0)
MCHC: 33.2 g/dL (ref 30.0–36.0)
MCV: 93.8 fL (ref 80.0–100.0)
Platelets: 252 10*3/uL (ref 150–400)
RBC: 4.21 MIL/uL — ABNORMAL LOW (ref 4.22–5.81)
RDW: 14.6 % (ref 11.5–15.5)
WBC: 12.9 10*3/uL — ABNORMAL HIGH (ref 4.0–10.5)
nRBC: 0 % (ref 0.0–0.2)

## 2022-11-26 LAB — C DIFFICILE QUICK SCREEN W PCR REFLEX
C Diff antigen: NEGATIVE
C Diff interpretation: NOT DETECTED
C Diff toxin: NEGATIVE

## 2022-11-26 LAB — MAGNESIUM: Magnesium: 1.8 mg/dL (ref 1.7–2.4)

## 2022-11-26 LAB — LACTIC ACID, PLASMA: Lactic Acid, Venous: 1.3 mmol/L (ref 0.5–1.9)

## 2022-11-26 LAB — GLUCOSE, CAPILLARY: Glucose-Capillary: 91 mg/dL (ref 70–99)

## 2022-11-26 LAB — PHOSPHORUS: Phosphorus: 5.6 mg/dL — ABNORMAL HIGH (ref 2.5–4.6)

## 2022-11-26 MED ORDER — LACTATED RINGERS IV SOLN: INTRAVENOUS | Status: AC

## 2022-11-26 MED ORDER — TAMSULOSIN HCL 0.4 MG PO CAPS
0.4000 mg | ORAL_CAPSULE | Freq: Every day | ORAL | Status: DC
  Administered 2022-11-26 – 2022-12-03 (×8): 0.4 mg via ORAL
  Filled 2022-11-26 (×8): qty 1

## 2022-11-26 MED ORDER — CHLORHEXIDINE GLUCONATE CLOTH 2 % EX PADS
6.0000 | MEDICATED_PAD | Freq: Every day | CUTANEOUS | Status: DC
  Administered 2022-11-26 – 2022-12-03 (×8): 6 via TOPICAL

## 2022-11-26 NOTE — ED Notes (Signed)
Pt's foley catheter was flushed again--serosanguinous urine is draining at this time

## 2022-11-26 NOTE — ED Notes (Signed)
Lab called this RN and stated that the green top had hemolyzed.  Requested for lab to come draw

## 2022-11-26 NOTE — Consult Note (Addendum)
H&P Physician requesting consult: Irena Cords  Chief Complaint: Urinary retention, renal insufficiency  History of Present Illness: 82 year old male presenting with creatinine of 8.08 and renal ultrasound showing bilateral hydronephrosis.  Follow-up CT scan again showed moderate right, mild left hydronephrosis with no obvious obstructing stone.  Foley catheter was within the bladder.  Reported immediate output of 1350 cc after Foley catheter placement.  Had 4.4 L output overnight consistent with postobstructive diuresis.  Reportedly had gross hematuria after Foley catheter placement.  Urine is now clear yellow and hematuria has resolved.  Creatinine has not yet improved.  However, still excellent urine output with 1400 output charted so far today.  Patient has weakness and some confusion and unable to offer much of the history today.  Per hospitalist note, nephrology is following and patient may need hemodialysis if no improvement in renal function.  Past Medical History:  Diagnosis Date   Anxiety    Aortic atherosclerosis (HCC)    Asymptomatic bradycardia    Cyst of breast, right 1981   Gastric ulcer    GERD (gastroesophageal reflux disease)    Hepatitis 1988   History of shingles    Hyperlipidemia    Hypertension    Long term current use of anticoagulant    a.) apixaban   Osteoarthritis    PAF (paroxysmal atrial fibrillation) (HCC)    a.) CHA2DS2VASc = 4 (age x2, HTN, vascular disease history);  b.) rate/rhythm maintained on oral metoprolol tartrate; chronically anticoagulated with apixaban   RBBB (right bundle branch block)    Recurrent falls    Skin cancer of face    SSS (sick sinus syndrome) (HCC)    Past Surgical History:  Procedure Laterality Date   BREAST EXCISIONAL BIOPSY Right 1981   neg   COLON SURGERY     age 15 month old   REVERSE SHOULDER ARTHROPLASTY Left 09/22/2022   Procedure: REVERSE SHOULDER ARTHROPLASTY with BICEPS TENODESIS;  Surgeon: Christena Flake, MD;   Location: [facility] ORS;  Service: Orthopedics;  Laterality: Left;   TONSILLECTOMY     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   TOTAL HIP ARTHROPLASTY Right 01/01/2019   Procedure: TOTAL HIP ARTHROPLASTY ANTERIOR APPROACH;  Surgeon: Kennedy Bucker, MD;  Location: [facility] ORS;  Service: Orthopedics;  Laterality: Right;    Home Medications:  Medications Prior to Admission  Medication Sig Dispense Refill Last Dose   acetaminophen (TYLENOL) 500 MG tablet Take 2 tablets (1,000 mg total) by mouth every 6 (six) hours as needed (pain). 60 tablet 0 unk   apixaban (ELIQUIS) 2.5 MG TABS tablet Take 1 tablet (2.5 mg total) by mouth 2 (two) times daily.   11/25/2022 at 0700   hydrALAZINE (APRESOLINE) 25 MG tablet Take 1 tablet by mouth 3 (three) times daily.   11/25/2022   hydrochlorothiazide (HYDRODIURIL) 25 MG tablet Take 25 mg by mouth daily.   11/25/2022   lidocaine (LIDODERM) 5 % Place 1 patch onto the skin every 12 (twelve) hours. Remove & Discard patch within 12 hours or as directed by MD 10 patch 0 unk   melatonin 3 MG TABS tablet Take 6 mg by mouth at bedtime.   unk   Menthol, Topical Analgesic, (ICY HOT ORIGINAL PAIN RELIEF) 2.5 % GEL Apply topically as needed.   unk   metoprolol succinate (TOPROL-XL) 25 MG 24 hr tablet Take 0.5 tablets (12.5 mg total) by mouth daily.   11/25/2022   ondansetron (ZOFRAN) 4 MG tablet Take 1 tablet (4 mg total) by mouth every 6 (  six) hours as needed for nausea. 30 tablet 0 unk   Allergies: No Known Allergies  Family History  Problem Relation Age of Onset   CAD Mother    Lung cancer Father    Social History:  reports that he has never smoked. He has never used smokeless tobacco. He reports that he does not currently use alcohol. He reports that he does not use drugs.  ROS: A complete review of systems was performed.  All systems are negative except for pertinent findings as noted. ROS   Physical Exam:  Vital signs in last 24 hours: Temp:  [97.9 F (36.6 C)-98.8 F  (37.1 C)] 97.9 F (36.6 C) (08/31 1606) Pulse Rate:  [57-106] 79 (08/31 1606) Resp:  [15-26] 18 (08/31 1606) BP: (100-170)/(61-108) 130/77 (08/31 1606) SpO2:  [93 %-100 %] 99 % (08/31 1606) Weight:  [96.2 kg] 96.2 kg (08/31 0718) General:  Alert and awake.  No acute distress, confused HEENT: Normocephalic, atraumatic Neck: No JVD or lymphadenopathy Cardiovascular: Regular rate and rhythm Lungs: Regular rate and effort Abdomen: Soft, nontender, nondistended, no abdominal masses Genitourinary: Circumcised phallus with Foley catheter in place draining clear yellow urine Back: No CVA tenderness Extremities: No edema Neurologic: Grossly intact  Laboratory Data:  Results for orders placed or performed during the hospital encounter of 11/25/22 (from the past 24 hour(s))  Urinalysis, Routine w reflex microscopic -Urine, Catheterized     Status: Abnormal   Collection Time: 11/25/22  8:14 PM  Result Value Ref Range   Color, Urine YELLOW (A) YELLOW   APPearance TURBID (A) CLEAR   Specific Gravity, Urine 1.010 1.005 - 1.030   pH 7.0 5.0 - 8.0   Glucose, UA NEGATIVE NEGATIVE mg/dL   Hgb urine dipstick MODERATE (A) NEGATIVE   Bilirubin Urine NEGATIVE NEGATIVE   Ketones, ur NEGATIVE NEGATIVE mg/dL   Protein, ur 213 (A) NEGATIVE mg/dL   Nitrite NEGATIVE NEGATIVE   Leukocytes,Ua LARGE (A) NEGATIVE   RBC / HPF 11-20 0 - 5 RBC/hpf   WBC, UA >50 0 - 5 WBC/hpf   Bacteria, UA FEW (A) NONE SEEN   Squamous Epithelial / HPF NONE SEEN 0 - 5 /HPF   WBC Clumps PRESENT   Culture, blood (Routine X 2) w Reflex to ID Panel     Status: None (Preliminary result)   Collection Time: 11/25/22  9:48 PM   Specimen: BLOOD  Result Value Ref Range   Specimen Description BLOOD RIGHT ANTECUBITAL    Special Requests      BOTTLES DRAWN AEROBIC AND ANAEROBIC Blood Culture adequate volume   Culture      NO GROWTH < 12 HOURS Performed at [facility], [address] [place], [place] [address]    Report  Status PENDING   Culture, blood (Routine X 2) w Reflex to ID Panel     Status: None (Preliminary result)   Collection Time: 11/25/22  9:48 PM   Specimen: BLOOD  Result Value Ref Range   Specimen Description BLOOD BLOOD RIGHT FOREARM    Special Requests      BOTTLES DRAWN AEROBIC AND ANAEROBIC Blood Culture adequate volume   Culture      NO GROWTH < 12 HOURS Performed at [facility], [address] [place], [place] [address]    Report Status PENDING   Lactic acid, plasma     Status: None   Collection Time: 11/25/22  9:49 PM  Result Value Ref Range   Lactic Acid, Venous 1.3 0.5 -  1.9 mmol/L  CBC     Status: Abnormal   Collection Time: 11/26/22  5:00 AM  Result Value Ref Range   WBC 12.9 (H) 4.0 - 10.5 K/uL   RBC 4.21 (L) 4.22 - 5.81 MIL/uL   Hemoglobin 13.1 13.0 - 17.0 g/dL   HCT 89.2 11.9 - 41.7 %   MCV 93.8 80.0 - 100.0 fL   MCH 31.1 26.0 - 34.0 pg   MCHC 33.2 30.0 - 36.0 g/dL   RDW 40.8 14.4 - 81.8 %   Platelets 252 150 - 400 K/uL   nRBC 0.0 0.0 - 0.2 %  Basic metabolic panel     Status: Abnormal   Collection Time: 11/26/22  9:10 AM  Result Value Ref Range   Sodium 136 135 - 145 mmol/L   Potassium 4.7 3.5 - 5.1 mmol/L   Chloride 103 98 - 111 mmol/L   CO2 20 (L) 22 - 32 mmol/L   Glucose, Bld 118 (H) 70 - 99 mg/dL   BUN 563 (H) 8 - 23 mg/dL   Creatinine, Ser 1.49 (H) 0.61 - 1.24 mg/dL   Calcium 8.5 (L) 8.9 - 10.3 mg/dL   GFR, Estimated 6 (L) >60 mL/min   Anion gap 13 5 - 15  Magnesium     Status: None   Collection Time: 11/26/22  9:10 AM  Result Value Ref Range   Magnesium 1.8 1.7 - 2.4 mg/dL  Phosphorus     Status: Abnormal   Collection Time: 11/26/22  9:10 AM  Result Value Ref Range   Phosphorus 5.6 (H) 2.5 - 4.6 mg/dL  C Difficile Quick Screen w PCR reflex     Status: None   Collection Time: 11/26/22  4:00 PM   Specimen: STOOL  Result Value Ref Range   C Diff antigen NEGATIVE NEGATIVE   C Diff toxin NEGATIVE NEGATIVE   C Diff interpretation No C.  difficile detected.   Glucose, capillary     Status: None   Collection Time: 11/26/22  4:27 PM  Result Value Ref Range   Glucose-Capillary 91 70 - 99 mg/dL   Recent Results (from the past 240 hour(s))  Culture, blood (Routine X 2) w Reflex to ID Panel     Status: None (Preliminary result)   Collection Time: 11/25/22  9:48 PM   Specimen: BLOOD  Result Value Ref Range Status   Specimen Description BLOOD RIGHT ANTECUBITAL  Final   Special Requests   Final    BOTTLES DRAWN AEROBIC AND ANAEROBIC Blood Culture adequate volume   Culture   Final    NO GROWTH < 12 HOURS Performed at [facility], [address] [place], [place] [address]    Report Status PENDING  Incomplete  Culture, blood (Routine X 2) w Reflex to ID Panel     Status: None (Preliminary result)   Collection Time: 11/25/22  9:48 PM   Specimen: BLOOD  Result Value Ref Range Status   Specimen Description BLOOD BLOOD RIGHT FOREARM  Final   Special Requests   Final    BOTTLES DRAWN AEROBIC AND ANAEROBIC Blood Culture adequate volume   Culture   Final    NO GROWTH < 12 HOURS Performed at [facility], [address] [place], [place] [address]    Report Status PENDING  Incomplete  C Difficile Quick Screen w PCR reflex     Status: None   Collection Time: 11/26/22  4:00 PM   Specimen: STOOL  Result Value Ref Range Status  C Diff antigen NEGATIVE NEGATIVE Final   C Diff toxin NEGATIVE NEGATIVE Final   C Diff interpretation No C. difficile detected.  Final    Comment: Performed at [facility], [address] [place], [place] [address]   Creatinine: Recent Labs    11/25/22 1546 11/26/22 0910  CREATININE 8.08* 8.01*   Ultrasound and CT scan personally reviewed and is detailed in the history of present illness.  Impression/Assessment:  Gross hematuria, resolved Acute renal insufficiency Bilateral hydronephrosis likely secondary to bladder outlet obstruction  Plan:   Gross hematuria  most likely secondary to distention injury in setting of Eliquis.  Hematuria has resolved.  If no further hematuria tomorrow, can restart Eliquis.  Continue Foley catheter.  Typically creatinine will improve with Foley catheter decompression, however if not may need dialysis.  Defer to nephrology.  Patient will ultimately need to be discharged with Foley catheter with follow-up in the clinic.  I started him on tamsulosin.  May ultimately need urodynamics outpatient.  Given his hematuria also need cystoscopy outpatient.  Ray Church, III 11/26/2022, 5:54 PM

## 2022-11-26 NOTE — ED Notes (Signed)
Pt's urinary catheter wouldn't drain for emptying urine. Catheter was full of bloody urine and clots.  Pt was irrigated, urine becoming lighter, urinary bag was also changed as it wouldn't drain d/t clots. Pt tolerated procedure well

## 2022-11-26 NOTE — ED Notes (Signed)
Pt cleaned of bowel incontinence & bed linens changed

## 2022-11-26 NOTE — Progress Notes (Signed)
[facility] Progress Note  Patient: Steven Welch    OZD:[id number]  DOA: 11/25/2022     Date of Service: the patient was seen and examined on 11/26/2022  Chief Complaint  Patient presents with   Fall   Brief hospital course: Steven Welch is a 82 y.o. male with PMH of paroxysmal A-fib on Eliquis, sick sinus syndrome, hypertension, frequent falls, status post left shoulder surgery, seen by cardiology on 6/24 as reviewed from EMR, presented at [facility] ED with fall at home from standing position and striking his head while trying to get in the recliner.  Patient's wife reported to ED physician that patient is having worsening of weakness and confusion over the past week since discharge.  Patient is AO x 1, following command.  Unable to offer any complaints. ED workup: VS heart rate 57, BP stable, saturating well on room air Sodium 132, CO2 20, BUN 108 creatinine 8.0 Lactic acid 1.3 within normal range, WBC 12.9 elevated UA positive EKG sinus bradycardia with right BBB, inferior T wave inversion. CT head negative for any acute findings CT C-spine negative for any acute trauma CT A/P: 1. Moderate right and mild left hydroureteronephrosis. No urinary calculi. 2. Foley catheter within the bladder. The bladder is not well evaluated due to streak artifact. There may be some bladder wall thickening and perivesical stranding. Locules of gas either within the bladder lumen or wall. Correlate for cystitis. 3. Small right pleural effusion with pleural thickening similar to CT chest 04/09/2021. US renal: Mild bilateral hydronephrosis. Layering debris within the urinary bladder. 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 Held Eliquis due to hematuria, resume when hematuria resolved. Held hydrochlorothiazide due to AKI Resume metoprolol and hydralazine if hypotensive Monitor BP and titrate medication accordingly Continue to monitor on telemetry  Fall at home most likely secondary to renal failure Continue fall precautions PT and OT eval    Body mass index is 30.43 kg/m.  Interventions:  Diet: Renal diet DVT Prophylaxis: SCD, pharmacological prophylaxis contraindicated due to hematuria    Advance goals of care discussion: Full code  Family Communication: family was not present at bedside, at the time of interview.  The pt provided permission to discuss medical plan with the family. Opportunity was given to ask question and all questions were answered satisfactorily.   Disposition:  Pt is from Home, admitted with renal failure, hematuria, obstructive uropathy, sepsis due to UTI, still has elevated creatinine, on IV antibiotics, which precludes a safe discharge. Discharge to home with home health versus SNF, TBD after PT/OT eval, when stable, may need 3 to 5 days.  Subjective: No significant events overnight, patient is AO x 1, lying comfortably, following commands, Physical Exam: General: NAD, lying comfortably Appear in no distress, affect appropriate Eyes: PERRLA  ENT: Oral Mucosa Clear, moist  Neck: no  JVD,  Cardiovascular: S1 and S2 Present, no Murmur,  Respiratory: good respiratory effort, Bilateral Air entry equal and Decreased, no Crackles, no wheezes Abdomen: Bowel Sound present, Soft and no tenderness,  Skin: no rashes Extremities: 2+ Pedal edema, no calf tenderness Neurologic: without any new focal findings Gait not checked due to patient safety concerns  Vitals:   11/26/22 0930 11/26/22 1200 11/26/22 1500 11/26/22 1535  BP: 107/61 102/70 110/75 132/74  Pulse: 79 84 88 80  Resp: 16 18 20 18   Temp:  98.4 F (36.9 C) 98.8 F (37.1 C) 98.1 F (36.7 C)  TempSrc:  Oral Oral   SpO2: 94% 98% 100% 97%  Weight:      Height:        Intake/Output Summary (Last 24 hours) at 11/26/2022 1553 Last data filed at 11/26/2022 9562 Gross per 24 hour  Intake --  Output 4900 ml  Net -4900 ml   Filed Weights   11/25/22 2231 11/26/22 0718  Weight: 96.2 kg 96.2 kg    Data Reviewed: I have personally reviewed and interpreted daily labs, tele strips, imagings as discussed above. I reviewed all nursing notes, pharmacy notes, vitals, pertinent old records I have discussed plan of care as described above with RN and patient/family.  CBC: Recent Labs  Lab 11/25/22 1546 11/26/22 0500  WBC 12.8* 12.9*  NEUTROABS 10.3*  --   HGB 12.4* 13.1  HCT 37.0* 39.5  MCV 92.7 93.8  PLT 275 252   Basic Metabolic Panel: Recent Labs  Lab 11/25/22 1546 11/26/22 0910  NA 132* 136  K 4.6 4.7  CL 99 103  CO2 20* 20*  GLUCOSE 138* 118*  BUN 108* 116*  CREATININE 8.08* 8.01*  CALCIUM 9.2 8.5*  MG 2.4 1.8  PHOS  --  5.6*    Studies: CT ABDOMEN PELVIS WO CONTRAST  Result Date: 11/25/2022 CLINICAL DATA:  Kidney failure, acute hematuria EXAM: CT ABDOMEN AND PELVIS WITHOUT CONTRAST TECHNIQUE: Multidetector CT imaging of the abdomen and pelvis was performed following the standard protocol without IV contrast. RADIATION DOSE REDUCTION: This exam was performed according to the departmental  dose-optimization program which includes automated exposure control, adjustment of the mA and/or kV according to patient size and/or use of iterative reconstruction technique. COMPARISON:  Renal ultrasound 11/25/2022 FINDINGS: Lower chest: Small right pleural effusion. There is pleural thickening about the right pleural effusion. Trace left pleural effusion. Bibasilar airspace opacities. Findings are similar to CT chest 04/09/2021. Hepatobiliary: Unremarkable noncontrast appearance of the liver, gallbladder, and biliary tree. Pancreas: Unremarkable. Spleen: Unremarkable. Adrenals/Urinary Tract: Normal adrenal glands. Moderate right and mild left hydroureteronephrosis. No urinary calculi. Nonspecific perinephric stranding bilaterally. Foley catheter within the bladder which is poorly evaluated due to streak artifact. Question wall thickening and perivesical stranding. There are some locules of gas either within the bladder lumen or wall. Stomach/Bowel: Normal caliber large and small bowel. Colonic diverticulosis without diverticulitis. The appendix is not definitively visualized. Stomach is within normal limits other than a small hiatal hernia. Vascular/Lymphatic: Aortic atherosclerosis. No enlarged abdominal or pelvic lymph nodes. Reproductive: Not well evaluated due to streak artifact. No evidence of adnexal mass. Other: Small volume free fluid in the pelvis. No free intraperitoneal air. Musculoskeletal: No acute fracture.  Bilateral THA is. IMPRESSION: 1. Moderate right and mild left hydroureteronephrosis. No urinary calculi. 2. Foley catheter within the bladder. The bladder is not well evaluated due to streak artifact. There may be some bladder  wall thickening and perivesical stranding. Locules of gas either within the bladder lumen or wall. Correlate for cystitis. 3. Small right pleural effusion with pleural thickening similar to CT chest 04/09/2021. Aortic Atherosclerosis (ICD10-I70.0). Electronically Signed    By: Minerva Fester M.D.   On: 11/25/2022 22:47   US Renal  Result Date: 11/25/2022 CLINICAL DATA:  Acute kidney injury EXAM: RENAL / URINARY TRACT ULTRASOUND COMPLETE COMPARISON:  None Available. FINDINGS: Right Kidney: Renal measurements: 11.7 x 5.7 x 6.7 cm = volume: 233 mL. Mild right hydronephrosis. No mass. Normal echotexture. Left Kidney: Renal measurements: 11.5 x 5.8 x 5.3 cm = volume: 183 mL. Mild hydronephrosis. No mass. Normal echotexture. Bladder: Partially decompressed with Foley catheter in in place. Layering debris. Other: None. IMPRESSION: Mild bilateral hydronephrosis. Layering debris within the urinary bladder. Electronically Signed   By: Charlett Nose M.D.   On: 11/25/2022 20:34   CT Head Wo Contrast  Result Date: 11/25/2022 CLINICAL DATA:  Fall, head injury EXAM: CT HEAD WITHOUT CONTRAST CT CERVICAL SPINE WITHOUT CONTRAST TECHNIQUE: Multidetector CT imaging of the head and cervical spine was performed following the standard protocol without intravenous contrast. Multiplanar CT image reconstructions of the cervical spine were also generated. RADIATION DOSE REDUCTION: This exam was performed according to the departmental dose-optimization program which includes automated exposure control, adjustment of the mA and/or kV according to patient size and/or use of iterative reconstruction technique. COMPARISON:  CT head dated 08/17/2022. CT cervical spine dated 03/08/2022. FINDINGS: CT HEAD FINDINGS Brain: No evidence of acute infarction, hemorrhage, hydrocephalus, extra-axial collection or mass lesion/mass effect. Global cortical and central atrophy, temporal lobe predominant. Extensive subcortical white matter and periventricular small vessel ischemic changes. Vascular: No hyperdense vessel or unexpected calcification. Skull: Normal. Negative for fracture or focal lesion. Sinuses/Orbits: The visualized paranasal sinuses are essentially clear. The mastoid air cells are unopacified. Other: None.  CT CERVICAL SPINE FINDINGS Alignment: Normal cervical lordosis. Skull base and vertebrae: No acute fracture. No primary bone lesion or focal pathologic process. Soft tissues and spinal canal: No prevertebral fluid or swelling. No visible canal hematoma. Disc levels: Mild degenerative changes of the mid cervical spine. Spinal canal is patent. Upper chest: Visualized lung apices are clear. Other: Visualized thyroid is unremarkable. IMPRESSION: No acute intracranial abnormality. Atrophy with small vessel ischemic changes. No traumatic injury to the cervical spine. Mild degenerative changes. Electronically Signed   By: Charline Bills M.D.   On: 11/25/2022 16:20   CT CERVICAL SPINE WO CONTRAST  Result Date: 11/25/2022 CLINICAL DATA:  Fall, head injury EXAM: CT HEAD WITHOUT CONTRAST CT CERVICAL SPINE WITHOUT CONTRAST TECHNIQUE: Multidetector CT imaging of the head and cervical spine was performed following the standard protocol without intravenous contrast. Multiplanar CT image reconstructions of the cervical spine were also generated. RADIATION DOSE REDUCTION: This exam was performed according to the departmental dose-optimization program which includes automated exposure control, adjustment of the mA and/or kV according to patient size and/or use of iterative reconstruction technique. COMPARISON:  CT head dated 08/17/2022. CT cervical spine dated 03/08/2022. FINDINGS: CT HEAD FINDINGS Brain: No evidence of acute infarction, hemorrhage, hydrocephalus, extra-axial collection or mass lesion/mass effect. Global cortical and central atrophy, temporal lobe predominant. Extensive subcortical white matter and periventricular small vessel ischemic changes. Vascular: No hyperdense vessel or unexpected calcification. Skull: Normal. Negative for fracture or focal lesion. Sinuses/Orbits: The visualized paranasal sinuses are essentially clear. The mastoid air cells are unopacified. Other: None. CT CERVICAL SPINE FINDINGS  Alignment: Normal cervical lordosis. Skull base  and vertebrae: No acute fracture. No primary bone lesion or focal pathologic process. Soft tissues and spinal canal: No prevertebral fluid or swelling. No visible canal hematoma. Disc levels: Mild degenerative changes of the mid cervical spine. Spinal canal is patent. Upper chest: Visualized lung apices are clear. Other: Visualized thyroid is unremarkable. IMPRESSION: No acute intracranial abnormality. Atrophy with small vessel ischemic changes. No traumatic injury to the cervical spine. Mild degenerative changes. Electronically Signed   By: Charline Bills M.D.   On: 11/25/2022 16:20    Scheduled Meds:  sodium chloride flush  3 mL Intravenous Q12H   tamsulosin  0.4 mg Oral Daily   Continuous Infusions:  lactated ringers 50 mL/hr at 11/26/22 0102   piperacillin-tazobactam (ZOSYN)  IV 2.25 g (11/26/22 1516)   PRN Meds: acetaminophen **OR** acetaminophen, morphine injection  Time spent: 55 minutes  Author: Gillis Santa. MD [facility] 11/26/2022 3:53 PM  To reach On-call, see care teams to locate the attending and reach out to them via [email or web address]. If 7PM-7AM, please contact night-coverage If you still have difficulty reaching the attending provider, please page the [facility] (Director on Call) for [facility] on amion for assistance.

## 2022-11-26 NOTE — Plan of Care (Signed)

## 2022-11-26 NOTE — ED Notes (Signed)
Urine that is draining is clear but serosanguinous instead of bright red blood now

## 2022-11-27 DIAGNOSIS — R42 Dizziness and giddiness: Secondary | ICD-10-CM | POA: Diagnosis not present

## 2022-11-27 DIAGNOSIS — W19XXXA Unspecified fall, initial encounter: Secondary | ICD-10-CM | POA: Diagnosis not present

## 2022-11-27 DIAGNOSIS — R55 Syncope and collapse: Secondary | ICD-10-CM | POA: Diagnosis not present

## 2022-11-27 LAB — CBC
HCT: 35.3 % — ABNORMAL LOW (ref 39.0–52.0)
Hemoglobin: 11.8 g/dL — ABNORMAL LOW (ref 13.0–17.0)
MCH: 30.9 pg (ref 26.0–34.0)
MCHC: 33.4 g/dL (ref 30.0–36.0)
MCV: 92.4 fL (ref 80.0–100.0)
Platelets: 216 10*3/uL (ref 150–400)
RBC: 3.82 MIL/uL — ABNORMAL LOW (ref 4.22–5.81)
RDW: 14.6 % (ref 11.5–15.5)
WBC: 9.3 10*3/uL (ref 4.0–10.5)
nRBC: 0 % (ref 0.0–0.2)

## 2022-11-27 LAB — BASIC METABOLIC PANEL
Anion gap: 11 (ref 5–15)
BUN: 107 mg/dL — ABNORMAL HIGH (ref 8–23)
CO2: 20 mmol/L — ABNORMAL LOW (ref 22–32)
Calcium: 8.4 mg/dL — ABNORMAL LOW (ref 8.9–10.3)
Chloride: 104 mmol/L (ref 98–111)
Creatinine, Ser: 7.18 mg/dL — ABNORMAL HIGH (ref 0.61–1.24)
GFR, Estimated: 7 mL/min — ABNORMAL LOW (ref >60)
Glucose, Bld: 94 mg/dL (ref 70–99)
Potassium: 4 mmol/L (ref 3.5–5.1)
Sodium: 135 mmol/L (ref 135–145)

## 2022-11-27 LAB — PHOSPHORUS: Phosphorus: 5.6 mg/dL — ABNORMAL HIGH (ref 2.5–4.6)

## 2022-11-27 LAB — MAGNESIUM: Magnesium: 2.4 mg/dL (ref 1.7–2.4)

## 2022-11-27 MED ORDER — ENSURE ENLIVE PO LIQD
237.0000 mL | Freq: Three times a day (TID) | ORAL | Status: DC
  Administered 2022-11-27 – 2022-12-02 (×15): 237 mL via ORAL

## 2022-11-27 MED ORDER — APIXABAN 2.5 MG PO TABS
2.5000 mg | ORAL_TABLET | Freq: Two times a day (BID) | ORAL | Status: DC
  Administered 2022-11-27: 2.5 mg via ORAL
  Filled 2022-11-27: qty 1

## 2022-11-27 MED ORDER — SODIUM BICARBONATE 650 MG PO TABS
1300.0000 mg | ORAL_TABLET | Freq: Two times a day (BID) | ORAL | Status: DC
  Administered 2022-11-27 – 2022-11-28 (×3): 1300 mg via ORAL
  Filled 2022-11-27 (×3): qty 2

## 2022-11-27 MED ORDER — LACTATED RINGERS IV SOLN: INTRAVENOUS | Status: AC

## 2022-11-27 NOTE — Plan of Care (Signed)

## 2022-11-27 NOTE — Progress Notes (Signed)
[facility] [place], [place] 11/27/22  Subjective:   Hospital day # 2  Patient was recently discharged from hospital on 10/21/2022.  He was admitted for a syncopal event.  During previous admission he was noted to have AKI of unclear cause.  His renal ultrasound showed mild bilateral hydronephrosis without obstruction.  Creatinine was 3.02 at the time of discharge and improving. This time he presented via EMS from home status post fall while getting into a recliner chair. His bladder scan showed over a liter of urine in the ER.  A Foley catheter was placed.  ER notes indicate that patient's urinary catheter was full of bloody urine and clots.  Catheter is to be irrigated as needed.  Renal: 08/31 0701 - 09/01 0700 In: -  Out: 2900 [Urine:2900] Lab Results  Component Value Date   CREATININE 7.18 (H) 11/27/2022   CREATININE 8.01 (H) 11/26/2022   CREATININE 8.08 (H) 11/25/2022   This morning, he denies any acute complaints. Foley catheter in place with clear yellow urine. Serum creatinine has started to improve slowly.   Objective:  Vital signs in last 24 hours:  Temp:  [97.4 F (36.3 C)-98.8 F (37.1 C)] 97.7 F (36.5 C) (09/01 1140) Pulse Rate:  [79-88] 84 (09/01 1140) Resp:  [16-20] 18 (09/01 1140) BP: (102-132)/(62-77) 112/67 (09/01 1140) SpO2:  [97 %-100 %] 99 % (09/01 1140)  Weight change: 0 kg Filed Weights   11/25/22 2231 11/26/22 0718  Weight: 96.2 kg 96.2 kg    Intake/Output:    Intake/Output Summary (Last 24 hours) at 11/27/2022 1148 Last data filed at 11/27/2022 0455 Gross per 24 hour  Intake --  Output 2400 ml  Net -2400 ml     Physical Exam: General: Chronically ill-appearing, laying in the bed  HEENT Moist oral mucous membranes  Pulm/lungs Normal breathing effort, coarse breath sounds  CVS/Heart Irregular rhythm  Abdomen:  Soft, nontender  Extremities: Trace to 1+ edema  Neurologic: Able to answer simple questions and follow  commands  Skin: No acute rashes   Foley catheter in place       Basic Metabolic Panel:  Recent Labs  Lab 11/25/22 1546 11/26/22 0910 11/27/22 0557  NA 132* 136 135  K 4.6 4.7 4.0  CL 99 103 104  CO2 20* 20* 20*  GLUCOSE 138* 118* 94  BUN 108* 116* 107*  CREATININE 8.08* 8.01* 7.18*  CALCIUM 9.2 8.5* 8.4*  MG 2.4 1.8 2.4  PHOS  --  5.6* 5.6*     CBC: Recent Labs  Lab 11/25/22 1546 11/26/22 0500 11/27/22 0557  WBC 12.8* 12.9* 9.3  NEUTROABS 10.3*  --   --   HGB 12.4* 13.1 11.8*  HCT 37.0* 39.5 35.3*  MCV 92.7 93.8 92.4  PLT 275 252 216     No results found for: "HEPBSAG", "HEPBSAB", "HEPBIGM"    Microbiology:  Recent Results (from the past 240 hour(s))  Culture, blood (Routine X 2) w Reflex to ID Panel     Status: None (Preliminary result)   Collection Time: 11/25/22  9:48 PM   Specimen: BLOOD  Result Value Ref Range Status   Specimen Description BLOOD RIGHT ANTECUBITAL  Final   Special Requests   Final    BOTTLES DRAWN AEROBIC AND ANAEROBIC Blood Culture adequate volume   Culture   Final    NO GROWTH 1 DAY Performed at [facility], [address], [place], [place] [address]    Report Status PENDING  Incomplete  Culture, blood (Routine X 2) w Reflex to ID Panel     Status: None (Preliminary result)   Collection Time: 11/25/22  9:48 PM   Specimen: BLOOD  Result Value Ref Range Status   Specimen Description BLOOD BLOOD RIGHT FOREARM  Final   Special Requests   Final    BOTTLES DRAWN AEROBIC AND ANAEROBIC Blood Culture adequate volume   Culture   Final    NO GROWTH 1 DAY Performed at [facility], [address] [place], [place] [address]    Report Status PENDING  Incomplete  C Difficile Quick Screen w PCR reflex     Status: None   Collection Time: 11/26/22  4:00 PM   Specimen: STOOL  Result Value Ref Range Status   C Diff antigen NEGATIVE NEGATIVE Final   C Diff toxin NEGATIVE NEGATIVE Final   C Diff interpretation No C.  difficile detected.  Final    Comment: Performed at [facility], [address] [place], [place] [address]  Gastrointestinal Panel by PCR , Stool     Status: None   Collection Time: 11/26/22  4:00 PM   Specimen: Stool  Result Value Ref Range Status   Campylobacter species NOT DETECTED NOT DETECTED Final   Plesimonas shigelloides NOT DETECTED NOT DETECTED Final   Salmonella species NOT DETECTED NOT DETECTED Final   Yersinia enterocolitica NOT DETECTED NOT DETECTED Final   Vibrio species NOT DETECTED NOT DETECTED Final   Vibrio cholerae NOT DETECTED NOT DETECTED Final   Enteroaggregative E coli (EAEC) NOT DETECTED NOT DETECTED Final   Enteropathogenic E coli (EPEC) NOT DETECTED NOT DETECTED Final   Enterotoxigenic E coli (ETEC) NOT DETECTED NOT DETECTED Final   Shiga like toxin producing E coli (STEC) NOT DETECTED NOT DETECTED Final   Shigella/Enteroinvasive E coli (EIEC) NOT DETECTED NOT DETECTED Final   Cryptosporidium NOT DETECTED NOT DETECTED Final   Cyclospora cayetanensis NOT DETECTED NOT DETECTED Final   Entamoeba histolytica NOT DETECTED NOT DETECTED Final   Giardia lamblia NOT DETECTED NOT DETECTED Final   Adenovirus F40/41 NOT DETECTED NOT DETECTED Final   Astrovirus NOT DETECTED NOT DETECTED Final   Norovirus GI/GII NOT DETECTED NOT DETECTED Final   Rotavirus A NOT DETECTED NOT DETECTED Final   Sapovirus (I, II, IV, and V) NOT DETECTED NOT DETECTED Final    Comment: Performed at [facility] [address] [place], [place] [address]    Coagulation Studies: No results for input(s): "LABPROT", "INR" in the last 72 hours.  Urinalysis: Recent Labs    11/25/22 2014  COLORURINE YELLOW*  LABSPEC 1.010  PHURINE 7.0  GLUCOSEU NEGATIVE  HGBUR MODERATE*  BILIRUBINUR NEGATIVE  KETONESUR NEGATIVE  PROTEINUR 100*  NITRITE NEGATIVE  LEUKOCYTESUR LARGE*      Imaging: CT ABDOMEN PELVIS WO CONTRAST  Result Date: 11/25/2022 CLINICAL DATA:  Kidney  failure, acute hematuria EXAM: CT ABDOMEN AND PELVIS WITHOUT CONTRAST TECHNIQUE: Multidetector CT imaging of the abdomen and pelvis was performed following the standard protocol without IV contrast. RADIATION DOSE REDUCTION: This exam was performed according to the departmental dose-optimization program which includes automated exposure control, adjustment of the mA and/or kV according to patient size and/or use of iterative reconstruction technique. COMPARISON:  Renal ultrasound 11/25/2022 FINDINGS: Lower chest: Small right pleural effusion. There is pleural thickening about the right pleural effusion. Trace left pleural effusion. Bibasilar airspace opacities. Findings are similar to CT chest 04/09/2021. Hepatobiliary: Unremarkable noncontrast appearance of the liver, gallbladder, and biliary tree. Pancreas:  Unremarkable. Spleen: Unremarkable. Adrenals/Urinary Tract: Normal adrenal glands. Moderate right and mild left hydroureteronephrosis. No urinary calculi. Nonspecific perinephric stranding bilaterally. Foley catheter within the bladder which is poorly evaluated due to streak artifact. Question wall thickening and perivesical stranding. There are some locules of gas either within the bladder lumen or wall. Stomach/Bowel: Normal caliber large and small bowel. Colonic diverticulosis without diverticulitis. The appendix is not definitively visualized. Stomach is within normal limits other than a small hiatal hernia. Vascular/Lymphatic: Aortic atherosclerosis. No enlarged abdominal or pelvic lymph nodes. Reproductive: Not well evaluated due to streak artifact. No evidence of adnexal mass. Other: Small volume free fluid in the pelvis. No free intraperitoneal air. Musculoskeletal: No acute fracture.  Bilateral THA is. IMPRESSION: 1. Moderate right and mild left hydroureteronephrosis. No urinary calculi. 2. Foley catheter within the bladder. The bladder is not well evaluated due to streak artifact. There may be some  bladder wall thickening and perivesical stranding. Locules of gas either within the bladder lumen or wall. Correlate for cystitis. 3. Small right pleural effusion with pleural thickening similar to CT chest 04/09/2021. Aortic Atherosclerosis (ICD10-I70.0). Electronically Signed   By: Minerva Fester M.D.   On: 11/25/2022 22:47   US Renal  Result Date: 11/25/2022 CLINICAL DATA:  Acute kidney injury EXAM: RENAL / URINARY TRACT ULTRASOUND COMPLETE COMPARISON:  None Available. FINDINGS: Right Kidney: Renal measurements: 11.7 x 5.7 x 6.7 cm = volume: 233 mL. Mild right hydronephrosis. No mass. Normal echotexture. Left Kidney: Renal measurements: 11.5 x 5.8 x 5.3 cm = volume: 183 mL. Mild hydronephrosis. No mass. Normal echotexture. Bladder: Partially decompressed with Foley catheter in in place. Layering debris. Other: None. IMPRESSION: Mild bilateral hydronephrosis. Layering debris within the urinary bladder. Electronically Signed   By: Charlett Nose M.D.   On: 11/25/2022 20:34   CT Head Wo Contrast  Result Date: 11/25/2022 CLINICAL DATA:  Fall, head injury EXAM: CT HEAD WITHOUT CONTRAST CT CERVICAL SPINE WITHOUT CONTRAST TECHNIQUE: Multidetector CT imaging of the head and cervical spine was performed following the standard protocol without intravenous contrast. Multiplanar CT image reconstructions of the cervical spine were also generated. RADIATION DOSE REDUCTION: This exam was performed according to the departmental dose-optimization program which includes automated exposure control, adjustment of the mA and/or kV according to patient size and/or use of iterative reconstruction technique. COMPARISON:  CT head dated 08/17/2022. CT cervical spine dated 03/08/2022. FINDINGS: CT HEAD FINDINGS Brain: No evidence of acute infarction, hemorrhage, hydrocephalus, extra-axial collection or mass lesion/mass effect. Global cortical and central atrophy, temporal lobe predominant. Extensive subcortical white matter and  periventricular small vessel ischemic changes. Vascular: No hyperdense vessel or unexpected calcification. Skull: Normal. Negative for fracture or focal lesion. Sinuses/Orbits: The visualized paranasal sinuses are essentially clear. The mastoid air cells are unopacified. Other: None. CT CERVICAL SPINE FINDINGS Alignment: Normal cervical lordosis. Skull base and vertebrae: No acute fracture. No primary bone lesion or focal pathologic process. Soft tissues and spinal canal: No prevertebral fluid or swelling. No visible canal hematoma. Disc levels: Mild degenerative changes of the mid cervical spine. Spinal canal is patent. Upper chest: Visualized lung apices are clear. Other: Visualized thyroid is unremarkable. IMPRESSION: No acute intracranial abnormality. Atrophy with small vessel ischemic changes. No traumatic injury to the cervical spine. Mild degenerative changes. Electronically Signed   By: Charline Bills M.D.   On: 11/25/2022 16:20   CT CERVICAL SPINE WO CONTRAST  Result Date: 11/25/2022 CLINICAL DATA:  Fall, head injury EXAM: CT HEAD WITHOUT CONTRAST CT CERVICAL  SPINE WITHOUT CONTRAST TECHNIQUE: Multidetector CT imaging of the head and cervical spine was performed following the standard protocol without intravenous contrast. Multiplanar CT image reconstructions of the cervical spine were also generated. RADIATION DOSE REDUCTION: This exam was performed according to the departmental dose-optimization program which includes automated exposure control, adjustment of the mA and/or kV according to patient size and/or use of iterative reconstruction technique. COMPARISON:  CT head dated 08/17/2022. CT cervical spine dated 03/08/2022. FINDINGS: CT HEAD FINDINGS Brain: No evidence of acute infarction, hemorrhage, hydrocephalus, extra-axial collection or mass lesion/mass effect. Global cortical and central atrophy, temporal lobe predominant. Extensive subcortical white matter and periventricular small vessel  ischemic changes. Vascular: No hyperdense vessel or unexpected calcification. Skull: Normal. Negative for fracture or focal lesion. Sinuses/Orbits: The visualized paranasal sinuses are essentially clear. The mastoid air cells are unopacified. Other: None. CT CERVICAL SPINE FINDINGS Alignment: Normal cervical lordosis. Skull base and vertebrae: No acute fracture. No primary bone lesion or focal pathologic process. Soft tissues and spinal canal: No prevertebral fluid or swelling. No visible canal hematoma. Disc levels: Mild degenerative changes of the mid cervical spine. Spinal canal is patent. Upper chest: Visualized lung apices are clear. Other: Visualized thyroid is unremarkable. IMPRESSION: No acute intracranial abnormality. Atrophy with small vessel ischemic changes. No traumatic injury to the cervical spine. Mild degenerative changes. Electronically Signed   By: Charline Bills M.D.   On: 11/25/2022 16:20     Medications:    lactated ringers 75 mL/hr at 11/27/22 0545   piperacillin-tazobactam (ZOSYN)  IV 2.25 g (11/27/22 0548)    Chlorhexidine Gluconate Cloth  6 each Topical Daily   sodium bicarbonate  1,300 mg Oral BID   sodium chloride flush  3 mL Intravenous Q12H   tamsulosin  0.4 mg Oral Daily   acetaminophen **OR** acetaminophen, morphine injection  Assessment/ Plan:  82 y.o. male with history of atrial fibrillation, recent reverse arthroplasty of left shoulder 09/22/2022 by Dr Joice Lofts, hypertension admitted on 11/25/2022 for Urinary retention [R33.9] Fall [W19.XXXA] Acute cystitis without hematuria [N30.00] AKI (acute kidney injury) (HCC) [N17.9] Fall, initial encounter [W19.XXXA]   #Acute kidney injury secondary to acute urinary retention and bilateral hydronephrosis.   Patient's baseline creatinine is 1.17 from 09/23/2022 Patient has been evaluated by urologist.  His most recent CT scan from 11/25/2022 showed moderate right and mild left hydronephrosis with no obvious obstructing  stone. Good urine output obtained after Foley was placed. Currently he is experiencing postobstructive diuresis.  He has had over 7 L of urine output since admission. Electrolytes and volume status is acceptable.  No acute indication for dialysis at present  #Gross hematuria Secondary to Eliquis and bladder distention injury. Urine appears to be clearing now  #Acute metabolic acidosis Will add oral sodium bicarbonate supplementation    LOS: 2 Darrian Grzelak Thedore Mins 9/1/202411:48 AM  [facility] [place], [place] [phone]  Note: This note was prepared with Dragon dictation. Any transcription errors are unintentional

## 2022-11-27 NOTE — Progress Notes (Signed)
[facility] Progress Note  Patient: Steven Welch    ZOX:[id number]  DOA: 11/25/2022     Date of Service: the patient was seen and examined on 11/27/2022  Chief Complaint  Patient presents with   Fall   Brief hospital course: Steven Welch is a 82 y.o. male with PMH of paroxysmal A-fib on Eliquis, sick sinus syndrome, hypertension, frequent falls, status post left shoulder surgery, seen by cardiology on 6/24 as reviewed from EMR, presented at [facility] ED with fall at home from standing position and striking his head while trying to get in the recliner.  Patient's wife reported to ED physician that patient is having worsening of weakness and confusion over the past week since discharge.  Patient is AO x 1, following command.  Unable to offer any complaints. ED workup: VS heart rate 57, BP stable, saturating well on room air Sodium 132, CO2 20, BUN 108 creatinine 8.0 Lactic acid 1.3 within normal range, WBC 12.9 elevated UA positive EKG sinus bradycardia with right BBB, inferior T wave inversion. CT head negative for any acute findings CT C-spine negative for any acute trauma CT A/P: 1. Moderate right and mild left hydroureteronephrosis. No urinary calculi. 2. Foley catheter within the bladder. The bladder is not well evaluated due to streak artifact. There may be some bladder wall thickening and perivesical stranding. Locules of gas either within the bladder lumen or wall. Correlate for cystitis. 3. Small right pleural effusion with pleural thickening similar to CT chest 04/09/2021. US renal: Mild bilateral hydronephrosis. Layering debris within the urinary bladder. Urine culture and blood culture collected  Assessment and Plan:  Sepsis due to UTI UA positive, Continue IV fluid for hydration Continue Zosyn Pharmacy consulted for antibiotic management Blood culture NGTD Urine culture growing Klebsiella oxytoca, follow sensitivity report   Urinary retention with bilateral  hydronephrosis Foley catheter inserted, developed gross hematuria could be secondary to distended urinary bladder and traumatic Foley catheter insertion. 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 Held Eliquis due to hematuria, resumed on 9/1, hematuria resolved. Held hydrochlorothiazide due to AKI Resume metoprolol and hydralazine if hypertensive Monitor BP and titrate medication accordingly Continue to monitor on telemetry  Fall at home most likely secondary to renal failure Continue fall precautions PT and OT eval    Body mass index is 30.43 kg/m.  Interventions:  Diet: Renal diet DVT Prophylaxis: Therapeutic Anticoagulation with Eliquis    Advance goals of care discussion: Full code  Family Communication: family was not present at bedside, at the time of interview.  The pt provided permission to discuss medical plan with the family. Opportunity was given to ask question and all questions were answered satisfactorily.  9/1 Discussed with family over the phone  Disposition:  Pt is from Home, admitted with renal failure, hematuria, obstructive uropathy, sepsis due to UTI, still has elevated creatinine, on IV antibiotics, which precludes a safe discharge. Discharge to home with home health versus SNF, TBD after PT/OT eval, when stable, may  need 3 to 5  days.  Subjective: No significant events overnight, patient is AO x 1, lying comfortably, unable to offer any complaints.   Physical Exam: General: NAD, lying comfortably Appear in no distress, affect appropriate Eyes: PERRLA ENT: Oral Mucosa Clear, moist  Neck: no JVD,  Cardiovascular: S1 and S2 Present, no Murmur,  Respiratory: good respiratory effort, Bilateral Air entry equal and Decreased, no Crackles, no wheezes Abdomen: Bowel Sound present, Soft and no tenderness,  Skin: no rashes Extremities: 2+ Pedal edema, no calf tenderness Neurologic: without any new focal findings Gait not checked due to patient safety concerns  Vitals:   11/27/22 0406 11/27/22 0811 11/27/22 1140 11/27/22 1534  BP: 119/73 108/62 112/67 124/62  Pulse: 82 79 84 83  Resp: 20 16 18 18   Temp: 97.6 F (36.4 C) (!) 97.4 F (36.3 C) 97.7 F (36.5 C) 97.8 F (36.6 C)  TempSrc: Oral     SpO2: 99% 98% 99% 95%  Weight:      Height:        Intake/Output Summary (Last 24 hours) at 11/27/2022 1540 Last data filed at 11/27/2022 1500 Gross per 24 hour  Intake 575 ml  Output 3600 ml  Net -3025 ml   Filed Weights   11/25/22 2231 11/26/22 0718  Weight: 96.2 kg 96.2 kg    Data Reviewed: I have personally reviewed and interpreted daily labs, tele strips, imagings as discussed above. I reviewed all nursing notes, pharmacy notes, vitals, pertinent old records I have discussed plan of care as described above with RN and patient/family.  CBC: Recent Labs  Lab 11/25/22 1546 11/26/22 0500 11/27/22 0557  WBC 12.8* 12.9* 9.3  NEUTROABS 10.3*  --   --   HGB 12.4* 13.1 11.8*  HCT 37.0* 39.5 35.3*  MCV 92.7 93.8 92.4  PLT 275 252 216   Basic Metabolic Panel: Recent Labs  Lab 11/25/22 1546 11/26/22 0910 11/27/22 0557  NA 132* 136 135  K 4.6 4.7 4.0  CL 99 103 104  CO2 20* 20* 20*  GLUCOSE 138* 118* 94  BUN 108* 116* 107*  CREATININE 8.08* 8.01* 7.18*  CALCIUM 9.2 8.5* 8.4*  MG 2.4 1.8 2.4  PHOS   --  5.6* 5.6*    Studies: No results found.  Scheduled Meds:  Chlorhexidine Gluconate Cloth  6 each Topical Daily   feeding supplement  237 mL Oral TID BM   sodium bicarbonate  1,300 mg Oral BID   sodium chloride flush  3 mL Intravenous Q12H   tamsulosin  0.4 mg Oral Daily   Continuous Infusions:  lactated ringers 75 mL/hr at 11/27/22 0545   piperacillin-tazobactam (ZOSYN)  IV 2.25 g (11/27/22 1330)   PRN Meds: acetaminophen **OR** acetaminophen, morphine injection  Time spent: 55 minutes  Author: Gillis Santa. MD [facility] 11/27/2022 3:40 PM  To reach On-call, see care teams to locate the attending and reach out to them via [email or web address]. If 7PM-7AM, please contact night-coverage If you still have difficulty reaching the attending provider, please page the [facility] (Director on Call) for [facility] on amion for assistance.

## 2022-11-27 NOTE — Progress Notes (Signed)
OT Cancellation Note  Patient Details Name: Steven Welch MRN: [id number] DOB: 29-Jun-1940   Cancelled Treatment:    Reason Eval/Treat Not Completed: Fatigue/lethargy limiting ability to participate (Pt stating he is too fatigued to sit up on EOB with OT at this time)  Alvester Morin 11/27/2022, 4:20 PM

## 2022-11-27 NOTE — Plan of Care (Signed)
°  Problem: Education: Goal: Knowledge of General Education information will improve Description: Including pain rating scale, medication(s)/side effects and non-pharmacologic comfort measures Outcome: Progressing   Problem: Clinical Measurements: Goal: Will remain free from infection Outcome: Progressing   Problem: Clinical Measurements: Goal: Respiratory complications will improve Outcome: Progressing   Problem: Clinical Measurements: Goal: Cardiovascular complication will be avoided Outcome: Progressing   Problem: Elimination: Goal: Will not experience complications related to urinary retention Outcome: Progressing   Problem: Pain Managment: Goal: General experience of comfort will improve Outcome: Progressing   Problem: Safety: Goal: Ability to remain free from injury will improve Outcome: Progressing

## 2022-11-27 NOTE — Progress Notes (Signed)
Urology Inpatient Progress Report  Urinary retention [R33.9] Fall [W19.XXXA] Acute cystitis without hematuria [N30.00] AKI (acute kidney injury) (HCC) [N17.9] Fall, initial encounter [W19.XXXA]        Intv/Subj: No acute events overnight. Patient is without complaint.  Feeling a little bit better today.  Creatinine slightly improved today.  Still with excellent urine output.  Principal Problem:   Fall Active Problems:   Sepsis (HCC)   Hypertension   PAF (paroxysmal atrial fibrillation) (HCC)   AKI (acute kidney injury) (HCC)   Gastro-esophageal reflux disease without esophagitis   Gross hematuria   Sepsis secondary to UTI (HCC)  Current Facility-Administered Medications  Medication Dose Route Frequency Provider Last Rate Last Admin   acetaminophen (TYLENOL) tablet 650 mg  650 mg Oral Q6H PRN Gertha Calkin, MD   650 mg at 11/26/22 1757   Or   acetaminophen (TYLENOL) suppository 650 mg  650 mg Rectal Q6H PRN Gertha Calkin, MD       Chlorhexidine Gluconate Cloth 2 % PADS 6 each  6 each Topical Daily Gillis Santa, MD   6 each at 11/26/22 1757   lactated ringers infusion   Intravenous Continuous Gillis Santa, MD 75 mL/hr at 11/27/22 0545 New Bag at 11/27/22 0545   morphine (PF) 2 MG/ML injection 2 mg  2 mg Intravenous Q4H PRN Gertha Calkin, MD       piperacillin-tazobactam (ZOSYN) IVPB 2.25 g  2.25 g Intravenous Q8H Irena Cords V, MD 100 mL/hr at 11/27/22 0548 2.25 g at 11/27/22 0548   sodium bicarbonate tablet 1,300 mg  1,300 mg Oral BID Mosetta Pigeon, MD       sodium chloride flush (NS) 0.9 % injection 3 mL  3 mL Intravenous Q12H Irena Cords V, MD   3 mL at 11/27/22 1025   tamsulosin (FLOMAX) capsule 0.4 mg  0.4 mg Oral Daily Ray Church III, MD   0.4 mg at 11/27/22 1024     Objective: Vital: Vitals:   11/26/22 2312 11/27/22 0406 11/27/22 0811 11/27/22 1140  BP: 126/74 119/73 108/62 112/67  Pulse: 86 82 79 84  Resp: 20 20 16 18   Temp: 97.9 F (36.6 C) 97.6 F  (36.4 C) (!) 97.4 F (36.3 C) 97.7 F (36.5 C)  TempSrc: Oral Oral    SpO2: 98% 99% 98% 99%  Weight:      Height:       I/Os: I/O last 3 completed shifts: In: -  Out: 7300 [Urine:7300]  Physical Exam:  General: Patient is in no apparent distress Lungs: Normal respiratory effort, chest expands symmetrically. GI:The abdomen is soft and nontender without mass. Foley: Draining clear yellow urine Ext: lower extremities symmetric  Lab Results: Recent Labs    11/25/22 1546 11/26/22 0500 11/27/22 0557  WBC 12.8* 12.9* 9.3  HGB 12.4* 13.1 11.8*  HCT 37.0* 39.5 35.3*   Recent Labs    11/25/22 1546 11/26/22 0910 11/27/22 0557  NA 132* 136 135  K 4.6 4.7 4.0  CL 99 103 104  CO2 20* 20* 20*  GLUCOSE 138* 118* 94  BUN 108* 116* 107*  CREATININE 8.08* 8.01* 7.18*  CALCIUM 9.2 8.5* 8.4*   No results for input(s): "LABPT", "INR" in the last 72 hours. No results for input(s): "LABURIN" in the last 72 hours. Results for orders placed or performed during the hospital encounter of 11/25/22  Urine Culture     Status: Abnormal (Preliminary result)   Collection Time: 11/25/22  3:46 PM  Specimen: Urine, Catheterized  Result Value Ref Range Status   Specimen Description   Final    URINE, CATHETERIZED Performed at [facility], [address] [place], [place] [address]    Special Requests   Final    NONE Performed at [facility], [address] [place], [place] [address]    Culture (A)  Final    >=100,000 COLONIES/mL KLEBSIELLA OXYTOCA SUSCEPTIBILITIES TO FOLLOW Performed at [facility], [address], [place], [place] [address]    Report Status PENDING  Incomplete  Culture, blood (Routine X 2) w Reflex to ID Panel     Status: None (Preliminary result)   Collection Time: 11/25/22  9:48 PM   Specimen: BLOOD  Result Value Ref Range Status   Specimen Description BLOOD RIGHT ANTECUBITAL  Final   Special Requests   Final    BOTTLES DRAWN AEROBIC  AND ANAEROBIC Blood Culture adequate volume   Culture   Final    NO GROWTH 1 DAY Performed at [facility], [address], [place], [place] [address]    Report Status PENDING  Incomplete  Culture, blood (Routine X 2) w Reflex to ID Panel     Status: None (Preliminary result)   Collection Time: 11/25/22  9:48 PM   Specimen: BLOOD  Result Value Ref Range Status   Specimen Description BLOOD BLOOD RIGHT FOREARM  Final   Special Requests   Final    BOTTLES DRAWN AEROBIC AND ANAEROBIC Blood Culture adequate volume   Culture   Final    NO GROWTH 1 DAY Performed at [facility], [address], [place], [place] [address]    Report Status PENDING  Incomplete  C Difficile Quick Screen w PCR reflex     Status: None   Collection Time: 11/26/22  4:00 PM   Specimen: STOOL  Result Value Ref Range Status   C Diff antigen NEGATIVE NEGATIVE Final   C Diff toxin NEGATIVE NEGATIVE Final   C Diff interpretation No C. difficile detected.  Final    Comment: Performed at [facility], [address] [place], [place] [address]  Gastrointestinal Panel by PCR , Stool     Status: None   Collection Time: 11/26/22  4:00 PM   Specimen: Stool  Result Value Ref Range Status   Campylobacter species NOT DETECTED NOT DETECTED Final   Plesimonas shigelloides NOT DETECTED NOT DETECTED Final   Salmonella species NOT DETECTED NOT DETECTED Final   Yersinia enterocolitica NOT DETECTED NOT DETECTED Final   Vibrio species NOT DETECTED NOT DETECTED Final   Vibrio cholerae NOT DETECTED NOT DETECTED Final   Enteroaggregative E coli (EAEC) NOT DETECTED NOT DETECTED Final   Enteropathogenic E coli (EPEC) NOT DETECTED NOT DETECTED Final   Enterotoxigenic E coli (ETEC) NOT DETECTED NOT DETECTED Final   Shiga like toxin producing E coli (STEC) NOT DETECTED NOT DETECTED Final   Shigella/Enteroinvasive E coli (EIEC) NOT DETECTED NOT DETECTED Final   Cryptosporidium NOT DETECTED NOT DETECTED Final    Cyclospora cayetanensis NOT DETECTED NOT DETECTED Final   Entamoeba histolytica NOT DETECTED NOT DETECTED Final   Giardia lamblia NOT DETECTED NOT DETECTED Final   Adenovirus F40/41 NOT DETECTED NOT DETECTED Final   Astrovirus NOT DETECTED NOT DETECTED Final   Norovirus GI/GII NOT DETECTED NOT DETECTED Final   Rotavirus A NOT DETECTED NOT DETECTED Final   Sapovirus (I, II, IV, and V) NOT DETECTED NOT DETECTED Final    Comment: Performed  at [facility], [address] [place], [place] [address]    Studies/Results: CT ABDOMEN PELVIS WO CONTRAST  Result Date: 11/25/2022 CLINICAL DATA:  Kidney failure, acute hematuria EXAM: CT ABDOMEN AND PELVIS WITHOUT CONTRAST TECHNIQUE: Multidetector CT imaging of the abdomen and pelvis was performed following the standard protocol without IV contrast. RADIATION DOSE REDUCTION: This exam was performed according to the departmental dose-optimization program which includes automated exposure control, adjustment of the mA and/or kV according to patient size and/or use of iterative reconstruction technique. COMPARISON:  Renal ultrasound 11/25/2022 FINDINGS: Lower chest: Small right pleural effusion. There is pleural thickening about the right pleural effusion. Trace left pleural effusion. Bibasilar airspace opacities. Findings are similar to CT chest 04/09/2021. Hepatobiliary: Unremarkable noncontrast appearance of the liver, gallbladder, and biliary tree. Pancreas: Unremarkable. Spleen: Unremarkable. Adrenals/Urinary Tract: Normal adrenal glands. Moderate right and mild left hydroureteronephrosis. No urinary calculi. Nonspecific perinephric stranding bilaterally. Foley catheter within the bladder which is poorly evaluated due to streak artifact. Question wall thickening and perivesical stranding. There are some locules of gas either within the bladder lumen or wall. Stomach/Bowel: Normal caliber large and small bowel. Colonic diverticulosis without  diverticulitis. The appendix is not definitively visualized. Stomach is within normal limits other than a small hiatal hernia. Vascular/Lymphatic: Aortic atherosclerosis. No enlarged abdominal or pelvic lymph nodes. Reproductive: Not well evaluated due to streak artifact. No evidence of adnexal mass. Other: Small volume free fluid in the pelvis. No free intraperitoneal air. Musculoskeletal: No acute fracture.  Bilateral THA is. IMPRESSION: 1. Moderate right and mild left hydroureteronephrosis. No urinary calculi. 2. Foley catheter within the bladder. The bladder is not well evaluated due to streak artifact. There may be some bladder wall thickening and perivesical stranding. Locules of gas either within the bladder lumen or wall. Correlate for cystitis. 3. Small right pleural effusion with pleural thickening similar to CT chest 04/09/2021. Aortic Atherosclerosis (ICD10-I70.0). Electronically Signed   By: Minerva Fester M.D.   On: 11/25/2022 22:47   US Renal  Result Date: 11/25/2022 CLINICAL DATA:  Acute kidney injury EXAM: RENAL / URINARY TRACT ULTRASOUND COMPLETE COMPARISON:  None Available. FINDINGS: Right Kidney: Renal measurements: 11.7 x 5.7 x 6.7 cm = volume: 233 mL. Mild right hydronephrosis. No mass. Normal echotexture. Left Kidney: Renal measurements: 11.5 x 5.8 x 5.3 cm = volume: 183 mL. Mild hydronephrosis. No mass. Normal echotexture. Bladder: Partially decompressed with Foley catheter in in place. Layering debris. Other: None. IMPRESSION: Mild bilateral hydronephrosis. Layering debris within the urinary bladder. Electronically Signed   By: Charlett Nose M.D.   On: 11/25/2022 20:34   CT Head Wo Contrast  Result Date: 11/25/2022 CLINICAL DATA:  Fall, head injury EXAM: CT HEAD WITHOUT CONTRAST CT CERVICAL SPINE WITHOUT CONTRAST TECHNIQUE: Multidetector CT imaging of the head and cervical spine was performed following the standard protocol without intravenous contrast. Multiplanar CT image  reconstructions of the cervical spine were also generated. RADIATION DOSE REDUCTION: This exam was performed according to the departmental dose-optimization program which includes automated exposure control, adjustment of the mA and/or kV according to patient size and/or use of iterative reconstruction technique. COMPARISON:  CT head dated 08/17/2022. CT cervical spine dated 03/08/2022. FINDINGS: CT HEAD FINDINGS Brain: No evidence of acute infarction, hemorrhage, hydrocephalus, extra-axial collection or mass lesion/mass effect. Global cortical and central atrophy, temporal lobe predominant. Extensive subcortical white matter and periventricular small vessel ischemic changes. Vascular: No hyperdense vessel or unexpected calcification. Skull: Normal. Negative for fracture or focal lesion. Sinuses/Orbits:  The visualized paranasal sinuses are essentially clear. The mastoid air cells are unopacified. Other: None. CT CERVICAL SPINE FINDINGS Alignment: Normal cervical lordosis. Skull base and vertebrae: No acute fracture. No primary bone lesion or focal pathologic process. Soft tissues and spinal canal: No prevertebral fluid or swelling. No visible canal hematoma. Disc levels: Mild degenerative changes of the mid cervical spine. Spinal canal is patent. Upper chest: Visualized lung apices are clear. Other: Visualized thyroid is unremarkable. IMPRESSION: No acute intracranial abnormality. Atrophy with small vessel ischemic changes. No traumatic injury to the cervical spine. Mild degenerative changes. Electronically Signed   By: Charline Bills M.D.   On: 11/25/2022 16:20   CT CERVICAL SPINE WO CONTRAST  Result Date: 11/25/2022 CLINICAL DATA:  Fall, head injury EXAM: CT HEAD WITHOUT CONTRAST CT CERVICAL SPINE WITHOUT CONTRAST TECHNIQUE: Multidetector CT imaging of the head and cervical spine was performed following the standard protocol without intravenous contrast. Multiplanar CT image reconstructions of the cervical  spine were also generated. RADIATION DOSE REDUCTION: This exam was performed according to the departmental dose-optimization program which includes automated exposure control, adjustment of the mA and/or kV according to patient size and/or use of iterative reconstruction technique. COMPARISON:  CT head dated 08/17/2022. CT cervical spine dated 03/08/2022. FINDINGS: CT HEAD FINDINGS Brain: No evidence of acute infarction, hemorrhage, hydrocephalus, extra-axial collection or mass lesion/mass effect. Global cortical and central atrophy, temporal lobe predominant. Extensive subcortical white matter and periventricular small vessel ischemic changes. Vascular: No hyperdense vessel or unexpected calcification. Skull: Normal. Negative for fracture or focal lesion. Sinuses/Orbits: The visualized paranasal sinuses are essentially clear. The mastoid air cells are unopacified. Other: None. CT CERVICAL SPINE FINDINGS Alignment: Normal cervical lordosis. Skull base and vertebrae: No acute fracture. No primary bone lesion or focal pathologic process. Soft tissues and spinal canal: No prevertebral fluid or swelling. No visible canal hematoma. Disc levels: Mild degenerative changes of the mid cervical spine. Spinal canal is patent. Upper chest: Visualized lung apices are clear. Other: Visualized thyroid is unremarkable. IMPRESSION: No acute intracranial abnormality. Atrophy with small vessel ischemic changes. No traumatic injury to the cervical spine. Mild degenerative changes. Electronically Signed   By: Charline Bills M.D.   On: 11/25/2022 16:20    Assessment: Acute renal insufficiency secondary to bladder outlet obstruction Gross hematuria likely secondary to distention injury in setting of Eliquis, resolved  Plan: Okay to restart Eliquis from my standpoint.  Continue Foley catheter.  He will ultimately need outpatient follow-up.  Continue tamsulosin   Modena Slater, MD Urology 11/27/2022, 12:21 PM

## 2022-11-27 NOTE — Plan of Care (Signed)
  Problem: Health Behavior/Discharge Planning: Goal: Ability to manage health-related needs will improve Outcome: Progressing   Problem: Clinical Measurements: Goal: Ability to maintain clinical measurements within normal limits will improve Outcome: Progressing Goal: Will remain free from infection Outcome: Progressing Goal: Diagnostic test results will improve Outcome: Progressing   

## 2022-11-27 NOTE — Evaluation (Signed)
Physical Therapy Evaluation Patient Details Name: Steven Welch MRN: [id number] DOB: May 09, 1940 Today's Date: 11/27/2022  History of Present Illness  82 y.o. male with PMH of paroxysmal A-fib on Eliquis, sick sinus syndrome, hypertension, frequent falls, status post left shoulder surgery, seen by cardiology on 6/24 as reviewed from EMR, presented at [facility] ED with fall at home from standing position and striking his head while trying to get in the recliner.  Patient's wife reported to ED physician that patient is having worsening of weakness and confusion over the past week  Clinical Impression  Attempted to see pt earlier today, gathered background, etc and on initiation of try some mobility found him to be significantly soiled with stool, he was vague as to whether he knew he was soilded or not, nursing called for clean up.  Returned later this AM and he was willing to talk with PT but not motivated (despite much multimodal/style cuing) to do much functionally at all.  Finally agreed to try sitting (preemptively refusing to try to get to chair), but struggled with this needing a lot of assist and likely would have had a very hard time trying to standing anyway.  Pt very limited functionally and does not appear safe to return home.  Will benefit from continued PT to address functional limitations.        If plan is discharge home, recommend the following: Two people to help with walking and/or transfers;Assistance with cooking/housework;Two people to help with bathing/dressing/bathroom;Assist for transportation;Help with stairs or ramp for entrance   Can travel by private vehicle   No    Equipment Recommendations  (TBD at next venue of care)  Recommendations for Other Services       Functional Status Assessment Patient has had a recent decline in their functional status and demonstrates the ability to make significant improvements in function in a reasonable and predictable amount of time.      Precautions / Restrictions Precautions Precautions: Fall Restrictions Weight Bearing Restrictions: No      Mobility  Bed Mobility Overal bed mobility: Needs Assistance Bed Mobility: Supine to Sit, Sit to Supine     Supine to sit: Max assist Sit to supine: Total assist   General bed mobility comments: Pt able/willing to show only minimal effort.  Managed to intermittently say sitting EOB w/o direct assist but every few seconds needed some hands on min/mod A to maintain upright.  Ultimately leaning back and trying to get himself back to supine after <30 seconds of sitting.    Transfers                   General transfer comment: pt unwilling to try, and frankly probably too weak to have success doing so    Ambulation/Gait                  Stairs            Wheelchair Mobility     Tilt Bed    Modified Rankin (Stroke Patients Only)       Balance Overall balance assessment: Needs assistance Sitting-balance support: Bilateral upper extremity supported Sitting balance-Leahy Scale: Poor Sitting balance - Comments: occasionally holding balance EOB for a few seconds w/o direct assist, but generally needing at least min/modA to keep from falling backward in sitting  Pertinent Vitals/Pain Pain Assessment Pain Assessment:  (reports generally feeling sore w/o specific/focal complaints)    Home Living Family/patient expects to be discharged to:: Unsure                   Additional Comments: per prior admission ~3 weeks ago: Wife states that HHOT/HHPT was ordered, but she did not want them to come, so she sent them away; now knows that she would've benefitted from that assistance. Pt uses incontinence briefs at baseline per wife.    Prior Function Prior Level of Function : Needs assist             Mobility Comments: pt using SPC and wife assist holding onto him for mobility, reports he does  not get out of the home much at all       Extremity/Trunk Assessment   Upper Extremity Assessment Upper Extremity Assessment: Generalized weakness;Difficult to assess due to impaired cognition (pt was resistant to doing a lot with PT, showed some function w/o allowing true testing)    Lower Extremity Assessment Lower Extremity Assessment: Generalized weakness;Difficult to assess due to impaired cognition (again pt not very interested in working with PT despite much cuing/encouargement - difficulty testing LE strength)       Communication   Communication Communication: No apparent difficulties  Cognition Arousal: Alert Behavior During Therapy: Flat affect Overall Cognitive Status: No family/caregiver present to determine baseline cognitive functioning                                 General Comments: Pt able to answer some questions but generally speaking displayed limited engagement with PT session and repeatedly stated that he'd just rather lay in bed and be lazy        General Comments General comments (skin integrity, edema, etc.): Pt obstinate about not wanting to do much with PT.  With much cuing and encouragement did get to sitting EOB, though struggled with even this limited functional task    Exercises     Assessment/Plan    PT Assessment Patient needs continued PT services  PT Problem List Decreased strength;Decreased range of motion;Decreased activity tolerance;Decreased balance;Decreased mobility;Decreased knowledge of use of DME;Decreased safety awareness       PT Treatment Interventions DME instruction;Gait training;Stair training;Functional mobility training;Therapeutic activities;Therapeutic exercise;Balance training;Patient/family education;Neuromuscular re-education    PT Goals (Current goals can be found in the Care Plan section)  Acute Rehab PT Goals Patient Stated Goal: "Lay down, be lazy."  Pt showed little motivation for PT/activity  today PT Goal Formulation: Patient unable to participate in goal setting Time For Goal Achievement: 12/10/22 Potential to Achieve Goals: Fair    Frequency Min 1X/week     Co-evaluation               AM-PAC PT "6 Clicks" Mobility  Outcome Measure Help needed turning from your back to your side while in a flat bed without using bedrails?: Total Help needed moving from lying on your back to sitting on the side of a flat bed without using bedrails?: Total Help needed moving to and from a bed to a chair (including a wheelchair)?: Total Help needed standing up from a chair using your arms (e.g., wheelchair or bedside chair)?: Total Help needed to walk in hospital room?: Total Help needed climbing 3-5 steps with a railing? : Total 6 Click Score: 6    End of Session  Activity Tolerance: Patient limited by fatigue (low motivation today) Patient left: with bed alarm set;with call bell/phone within reach Nurse Communication: Mobility status PT Visit Diagnosis: Muscle weakness (generalized) (M62.81);Difficulty in walking, not elsewhere classified (R26.2);History of falling (Z91.81)    Time: 6295-2841 PT Time Calculation (min) (ACUTE ONLY): 14 min   Charges:   PT Evaluation $PT Eval Low Complexity: 1 Low   PT General Charges $$ ACUTE PT VISIT: 1 Visit         Malachi Pro, DPT 11/27/2022, 1:02 PM

## 2022-11-28 DIAGNOSIS — R42 Dizziness and giddiness: Secondary | ICD-10-CM | POA: Diagnosis not present

## 2022-11-28 DIAGNOSIS — R55 Syncope and collapse: Secondary | ICD-10-CM | POA: Diagnosis not present

## 2022-11-28 DIAGNOSIS — W19XXXA Unspecified fall, initial encounter: Secondary | ICD-10-CM | POA: Diagnosis not present

## 2022-11-28 LAB — URINE CULTURE: Culture: >=100000 — AB

## 2022-11-28 LAB — CBC
HCT: 30.8 % — ABNORMAL LOW (ref 39.0–52.0)
Hemoglobin: 10.7 g/dL — ABNORMAL LOW (ref 13.0–17.0)
MCH: 31.1 pg (ref 26.0–34.0)
MCHC: 34.7 g/dL (ref 30.0–36.0)
MCV: 89.5 fL (ref 80.0–100.0)
Platelets: 233 10*3/uL (ref 150–400)
RBC: 3.44 MIL/uL — ABNORMAL LOW (ref 4.22–5.81)
RDW: 14.6 % (ref 11.5–15.5)
WBC: 7.4 10*3/uL (ref 4.0–10.5)
nRBC: 0 % (ref 0.0–0.2)

## 2022-11-28 LAB — HEMOGLOBIN AND HEMATOCRIT, BLOOD
HCT: 34.3 % — ABNORMAL LOW (ref 39.0–52.0)
Hemoglobin: 11.5 g/dL — ABNORMAL LOW (ref 13.0–17.0)

## 2022-11-28 LAB — BASIC METABOLIC PANEL
Anion gap: 12 (ref 5–15)
BUN: 98 mg/dL — ABNORMAL HIGH (ref 8–23)
CO2: 25 mmol/L (ref 22–32)
Calcium: 8.1 mg/dL — ABNORMAL LOW (ref 8.9–10.3)
Chloride: 105 mmol/L (ref 98–111)
Creatinine, Ser: 6.09 mg/dL — ABNORMAL HIGH (ref 0.61–1.24)
GFR, Estimated: 9 mL/min — ABNORMAL LOW (ref >60)
Glucose, Bld: 106 mg/dL — ABNORMAL HIGH (ref 70–99)
Potassium: 3.3 mmol/L — ABNORMAL LOW (ref 3.5–5.1)
Sodium: 142 mmol/L (ref 135–145)

## 2022-11-28 LAB — PHOSPHORUS: Phosphorus: 4.5 mg/dL (ref 2.5–4.6)

## 2022-11-28 LAB — MAGNESIUM: Magnesium: 2.2 mg/dL (ref 1.7–2.4)

## 2022-11-28 MED ORDER — PANTOPRAZOLE SODIUM 40 MG IV SOLR
40.0000 mg | Freq: Two times a day (BID) | INTRAVENOUS | Status: AC
  Administered 2022-11-28 – 2022-11-30 (×6): 40 mg via INTRAVENOUS
  Filled 2022-11-28 (×6): qty 10

## 2022-11-28 MED ORDER — PANTOPRAZOLE SODIUM 40 MG PO TBEC
40.0000 mg | DELAYED_RELEASE_TABLET | Freq: Two times a day (BID) | ORAL | Status: DC
  Administered 2022-12-01 – 2022-12-03 (×5): 40 mg via ORAL
  Filled 2022-11-28 (×5): qty 1

## 2022-11-28 MED ORDER — SODIUM CHLORIDE 0.9 % IV SOLN
1.0000 g | INTRAVENOUS | Status: DC
  Administered 2022-11-28 – 2022-12-01 (×4): 1 g via INTRAVENOUS
  Filled 2022-11-28 (×5): qty 10

## 2022-11-28 MED ORDER — APIXABAN 2.5 MG PO TABS
2.5000 mg | ORAL_TABLET | Freq: Two times a day (BID) | ORAL | Status: DC
  Administered 2022-11-28 – 2022-12-03 (×10): 2.5 mg via ORAL
  Filled 2022-11-28 (×10): qty 1

## 2022-11-28 MED ORDER — POTASSIUM CHLORIDE 20 MEQ PO PACK
20.0000 meq | PACK | Freq: Two times a day (BID) | ORAL | Status: AC
  Administered 2022-11-28 (×2): 20 meq via ORAL
  Filled 2022-11-28 (×2): qty 1

## 2022-11-28 NOTE — Care Management Important Message (Signed)
Important Message  Patient Details  Name: Steven Welch MRN: [id number] Date of Birth: 1940-05-09   Medicare Important Message Given:  Yes     [name] 11/28/2022, 12:45 PM

## 2022-11-28 NOTE — Progress Notes (Addendum)
While doing pt care, Staff noticed a small dark red stool.VSS. Eliquis was restarted yesterday. MD Para March made aware. Will continue to monitor.  Update 0038: See new orders. MD Para March ordered to hold eliquis at this time. Incoming shift made aware. Will continue to monitor.  Update 0057: See new orders. Will continue to monitor.  Update 0157: Pt hgb resulted and its at 11.5. MD Para March made aware. Will continue to monitor.  Update 0201: See new orders. Will continue to monitor.

## 2022-11-28 NOTE — Progress Notes (Signed)
Urology Inpatient Progress Report  Urinary retention [R33.9] Fall [W19.XXXA] Acute cystitis without hematuria [N30.00] AKI (acute kidney injury) (HCC) [N17.9] Fall, initial encounter [W19.XXXA]        Intv/Subj: No acute events overnight. Patient is without complaint.  Principal Problem:   Fall Active Problems:   Sepsis (HCC)   Hypertension   PAF (paroxysmal atrial fibrillation) (HCC)   AKI (acute kidney injury) (HCC)   Gastro-esophageal reflux disease without esophagitis   Gross hematuria   Sepsis secondary to UTI (HCC)  Current Facility-Administered Medications  Medication Dose Route Frequency Provider Last Rate Last Admin   acetaminophen (TYLENOL) tablet 650 mg  650 mg Oral Q6H PRN Gertha Calkin, MD   650 mg at 11/26/22 1757   Or   acetaminophen (TYLENOL) suppository 650 mg  650 mg Rectal Q6H PRN Gertha Calkin, MD       apixaban Everlene Balls) tablet 2.5 mg  2.5 mg Oral BID Gillis Santa, MD   2.5 mg at 11/27/22 2218   Chlorhexidine Gluconate Cloth 2 % PADS 6 each  6 each Topical Daily Gillis Santa, MD   6 each at 11/27/22 1000   feeding supplement (ENSURE ENLIVE / ENSURE PLUS) liquid 237 mL  237 mL Oral TID BM Gillis Santa, MD   237 mL at 11/28/22 1300   lactated ringers infusion   Intravenous Continuous Gillis Santa, MD 75 mL/hr at 11/28/22 0908 New Bag at 11/28/22 0908   morphine (PF) 2 MG/ML injection 2 mg  2 mg Intravenous Q4H PRN Gertha Calkin, MD       piperacillin-tazobactam (ZOSYN) IVPB 2.25 g  2.25 g Intravenous Q8H Irena Cords V, MD 100 mL/hr at 11/28/22 1301 2.25 g at 11/28/22 1301   potassium chloride (KLOR-CON) packet 20 mEq  20 mEq Oral BID Mosetta Pigeon, MD   20 mEq at 11/28/22 1255   sodium chloride flush (NS) 0.9 % injection 3 mL  3 mL Intravenous Q12H Irena Cords V, MD   3 mL at 11/28/22 0908   tamsulosin (FLOMAX) capsule 0.4 mg  0.4 mg Oral Daily Ray Church III, MD   0.4 mg at 11/28/22 0908     Objective: Vital: Vitals:   11/28/22 0407  11/28/22 0433 11/28/22 0901 11/28/22 1255  BP: 125/74  126/67 130/72  Pulse: 84  74 73  Resp: 16  16 16   Temp: 98 F (36.7 C)  98.2 F (36.8 C) (!) 97.5 F (36.4 C)  TempSrc: Oral  Oral Oral  SpO2: 96%  98% 100%  Weight:  91.5 kg    Height:       I/Os: I/O last 3 completed shifts: In: 2070.5 [P.O.:320; I.V.:575.5; TFTDD:2202; IV Piggyback:50] Out: 4400 [Urine:4400]  Physical Exam:  General: Patient is in no apparent distress Lungs: Normal respiratory effort, chest expands symmetrically. GI:  The abdomen is soft and nontender without mass. Foley: Draining clear yellow urine Ext: lower extremities symmetric  Lab Results: Recent Labs    11/26/22 0500 11/27/22 0557 11/28/22 0115 11/28/22 0501  WBC 12.9* 9.3  --  7.4  HGB 13.1 11.8* 11.5* 10.7*  HCT 39.5 35.3* 34.3* 30.8*   Recent Labs    11/26/22 0910 11/27/22 0557 11/28/22 0501  NA 136 135 142  K 4.7 4.0 3.3*  CL 103 104 105  CO2 20* 20* 25  GLUCOSE 118* 94 106*  BUN 116* 107* 98*  CREATININE 8.01* 7.18* 6.09*  CALCIUM 8.5* 8.4* 8.1*   No results for input(s): "LABPT", "INR" in  the last 72 hours. No results for input(s): "LABURIN" in the last 72 hours. Results for orders placed or performed during the hospital encounter of 11/25/22  Urine Culture     Status: Abnormal   Collection Time: 11/25/22  3:46 PM   Specimen: Urine, Catheterized  Result Value Ref Range Status   Specimen Description   Final    URINE, CATHETERIZED Performed at [facility], [address] [place], [place] [address]    Special Requests   Final    NONE Performed at [facility], [address] [place], [place] [address]    Culture >=100,000 COLONIES/mL KLEBSIELLA OXYTOCA (A)  Final   Report Status 11/28/2022 FINAL  Final   Organism ID, Bacteria KLEBSIELLA OXYTOCA (A)  Final      Susceptibility   Klebsiella oxytoca - MIC*    AMPICILLIN >=32 RESISTANT Resistant     CEFEPIME <=0.12 SENSITIVE Sensitive      CEFTRIAXONE <=0.25 SENSITIVE Sensitive     CIPROFLOXACIN <=0.25 SENSITIVE Sensitive     GENTAMICIN <=1 SENSITIVE Sensitive     IMIPENEM <=0.25 SENSITIVE Sensitive     NITROFURANTOIN 32 SENSITIVE Sensitive     TRIMETH/SULFA <=20 SENSITIVE Sensitive     AMPICILLIN/SULBACTAM 8 SENSITIVE Sensitive     PIP/TAZO <=4 SENSITIVE Sensitive     * >=100,000 COLONIES/mL KLEBSIELLA OXYTOCA  Culture, blood (Routine X 2) w Reflex to ID Panel     Status: None (Preliminary result)   Collection Time: 11/25/22  9:48 PM   Specimen: BLOOD  Result Value Ref Range Status   Specimen Description BLOOD RIGHT ANTECUBITAL  Final   Special Requests   Final    BOTTLES DRAWN AEROBIC AND ANAEROBIC Blood Culture adequate volume   Culture   Final    NO GROWTH 2 DAYS Performed at [facility], [address], [place], [place] [address]    Report Status PENDING  Incomplete  Culture, blood (Routine X 2) w Reflex to ID Panel     Status: None (Preliminary result)   Collection Time: 11/25/22  9:48 PM   Specimen: BLOOD  Result Value Ref Range Status   Specimen Description BLOOD BLOOD RIGHT FOREARM  Final   Special Requests   Final    BOTTLES DRAWN AEROBIC AND ANAEROBIC Blood Culture adequate volume   Culture   Final    NO GROWTH 2 DAYS Performed at [facility], [address] [place], [place] [address]    Report Status PENDING  Incomplete  C Difficile Quick Screen w PCR reflex     Status: None   Collection Time: 11/26/22  4:00 PM   Specimen: STOOL  Result Value Ref Range Status   C Diff antigen NEGATIVE NEGATIVE Final   C Diff toxin NEGATIVE NEGATIVE Final   C Diff interpretation No C. difficile detected.  Final    Comment: Performed at [facility], [address] [place], [place] [address]  Gastrointestinal Panel by PCR , Stool     Status: None   Collection Time: 11/26/22  4:00 PM   Specimen: Stool  Result Value Ref Range Status   Campylobacter species NOT DETECTED NOT DETECTED  Final   Plesimonas shigelloides NOT DETECTED NOT DETECTED Final   Salmonella species NOT DETECTED NOT DETECTED Final   Yersinia enterocolitica NOT DETECTED NOT DETECTED Final   Vibrio species NOT DETECTED NOT DETECTED Final   Vibrio cholerae NOT DETECTED NOT DETECTED Final   Enteroaggregative E coli (EAEC) NOT DETECTED NOT DETECTED  Final   Enteropathogenic E coli (EPEC) NOT DETECTED NOT DETECTED Final   Enterotoxigenic E coli (ETEC) NOT DETECTED NOT DETECTED Final   Shiga like toxin producing E coli (STEC) NOT DETECTED NOT DETECTED Final   Shigella/Enteroinvasive E coli (EIEC) NOT DETECTED NOT DETECTED Final   Cryptosporidium NOT DETECTED NOT DETECTED Final   Cyclospora cayetanensis NOT DETECTED NOT DETECTED Final   Entamoeba histolytica NOT DETECTED NOT DETECTED Final   Giardia lamblia NOT DETECTED NOT DETECTED Final   Adenovirus F40/41 NOT DETECTED NOT DETECTED Final   Astrovirus NOT DETECTED NOT DETECTED Final   Norovirus GI/GII NOT DETECTED NOT DETECTED Final   Rotavirus A NOT DETECTED NOT DETECTED Final   Sapovirus (I, II, IV, and V) NOT DETECTED NOT DETECTED Final    Comment: Performed at [facility], [address] [place], [place] [address]    Studies/Results: No results found.  Assessment: Gross hematuria secondary to likely distention injury, resolved Acute renal insufficiency secondary to bladder outlet obstruction, improving  Plan: Continue Foley catheter.  He will need outpatient cystoscopy and urodynamics.   Modena Slater, MD Urology 11/28/2022, 1:56 PM

## 2022-11-28 NOTE — Progress Notes (Signed)
       CROSS COVER NOTE  NAME: Steven Welch MRN: [id number] DOB : [birth year]    Concern as stated by nurse / staff   Hi Dr. Para March per day shift report pt urine was blood. but now amber looking with a tint of red in color (held eliquis at one point d/t hematuria) . However no bloody stool reported. They restarted his eliquis for (PAF)yesterday at 2200. While were doing our pt care noticed that pt is having a small dark red stool. Pt vitals stable at this time. Pt was admitted for fall and hematuria. What would you like to recommend next. thank you      Pertinent findings on chart review:    11/27/2022   11:51 PM 11/27/2022    8:18 PM 11/27/2022    3:34 PM  Vitals with BMI  Systolic 123 123 213  Diastolic 73 79 62  Pulse 84 86 83      Latest Ref Rng & Units 11/27/2022    5:57 AM 11/26/2022    5:00 AM 11/25/2022    3:46 PM  CBC  WBC 4.0 - 10.5 K/uL 9.3  12.9  12.8   Hemoglobin 13.0 - 17.0 g/dL 08.6  57.8  46.9   Hematocrit 39.0 - 52.0 % 35.3  39.5  37.0   Platelets 150 - 400 K/uL 216  252  275      Assessment and  Interventions   Assessment:  Hematochezia Recent hematuria resolved with holding Eliquis until 9/1  Plan: Hold Eliquis GI consult Continue to monitor CBC at next draw-has been downtrending

## 2022-11-28 NOTE — Plan of Care (Signed)

## 2022-11-28 NOTE — Progress Notes (Signed)
[facility] [place], [place] 11/28/22  Subjective:   Hospital day # 3  Patient was recently admitted for a syncopal event and discharged from hospital on 10/21/2022.  During previous admission he was noted to have AKI of unclear cause.  His renal ultrasound showed mild bilateral hydronephrosis without obstruction.  Creatinine was 3.02 at the time of discharge and improving. This time he presented via EMS from home status post fall while getting into a recliner chair. His bladder scan showed over a liter of urine in the ER.  A Foley catheter was placed.  ER notes indicate that patient's urinary catheter was full of bloody urine and clots.  Catheter is to be irrigated as needed.  Renal: 09/01 0701 - 09/02 0700 In: 2070.5 [P.O.:320; I.V.:575.5; IV Piggyback:50] Out: 2900 [Urine:2900] Lab Results  Component Value Date   CREATININE 6.09 (H) 11/28/2022   CREATININE 7.18 (H) 11/27/2022   CREATININE 8.01 (H) 11/26/2022   This morning, he denies any acute complaints. Foley catheter in place with clear yellow urine. Serum creatinine is continuing to improve slowly.   Objective:  Vital signs in last 24 hours:  Temp:  [97.7 F (36.5 C)-98.2 F (36.8 C)] 98.2 F (36.8 C) (09/02 0901) Pulse Rate:  [74-86] 74 (09/02 0901) Resp:  [16-18] 16 (09/02 0901) BP: (112-126)/(62-79) 126/67 (09/02 0901) SpO2:  [95 %-99 %] 98 % (09/02 0901) Weight:  [91.5 kg] 91.5 kg (09/02 0433)  Weight change: -4.7 kg Filed Weights   11/25/22 2231 11/26/22 0718 11/28/22 0433  Weight: 96.2 kg 96.2 kg 91.5 kg    Intake/Output:    Intake/Output Summary (Last 24 hours) at 11/28/2022 1054 Last data filed at 11/28/2022 0901 Gross per 24 hour  Intake 2070.53 ml  Output 3250 ml  Net -1179.47 ml     Physical Exam: General: Chronically ill-appearing, laying in the bed  HEENT Moist oral mucous membranes  Pulm/lungs Normal breathing effort, coarse breath sounds  CVS/Heart Irregular rhythm   Abdomen:  Soft, nontender  Extremities: Trace to 1+ edema  Neurologic: Able to answer simple questions and follow commands  Skin: No acute rashes   Foley catheter in place       Basic Metabolic Panel:  Recent Labs  Lab 11/25/22 1546 11/26/22 0910 11/27/22 0557 11/28/22 0501  NA 132* 136 135 142  K 4.6 4.7 4.0 3.3*  CL 99 103 104 105  CO2 20* 20* 20* 25  GLUCOSE 138* 118* 94 106*  BUN 108* 116* 107* 98*  CREATININE 8.08* 8.01* 7.18* 6.09*  CALCIUM 9.2 8.5* 8.4* 8.1*  MG 2.4 1.8 2.4 2.2  PHOS  --  5.6* 5.6* 4.5     CBC: Recent Labs  Lab 11/25/22 1546 11/26/22 0500 11/27/22 0557 11/28/22 0115 11/28/22 0501  WBC 12.8* 12.9* 9.3  --  7.4  NEUTROABS 10.3*  --   --   --   --   HGB 12.4* 13.1 11.8* 11.5* 10.7*  HCT 37.0* 39.5 35.3* 34.3* 30.8*  MCV 92.7 93.8 92.4  --  89.5  PLT 275 252 216  --  233     No results found for: "HEPBSAG", "HEPBSAB", "HEPBIGM"    Microbiology:  Recent Results (from the past 240 hour(s))  Urine Culture     Status: Abnormal   Collection Time: 11/25/22  3:46 PM   Specimen: Urine, Catheterized  Result Value Ref Range Status   Specimen Description   Final    URINE, CATHETERIZED Performed at [facility], [address] [place], [place] [address]    Special Requests   Final    NONE Performed at [facility], [address] [place], [place] [address]    Culture >=100,000 COLONIES/mL KLEBSIELLA OXYTOCA (A)  Final   Report Status 11/28/2022 FINAL  Final   Organism ID, Bacteria KLEBSIELLA OXYTOCA (A)  Final      Susceptibility   Klebsiella oxytoca - MIC*    AMPICILLIN >=32 RESISTANT Resistant     CEFEPIME <=0.12 SENSITIVE Sensitive     CEFTRIAXONE <=0.25 SENSITIVE Sensitive     CIPROFLOXACIN <=0.25 SENSITIVE Sensitive     GENTAMICIN <=1 SENSITIVE Sensitive     IMIPENEM <=0.25 SENSITIVE Sensitive     NITROFURANTOIN 32 SENSITIVE Sensitive     TRIMETH/SULFA <=20 SENSITIVE Sensitive     AMPICILLIN/SULBACTAM 8  SENSITIVE Sensitive     PIP/TAZO <=4 SENSITIVE Sensitive     * >=100,000 COLONIES/mL KLEBSIELLA OXYTOCA  Culture, blood (Routine X 2) w Reflex to ID Panel     Status: None (Preliminary result)   Collection Time: 11/25/22  9:48 PM   Specimen: BLOOD  Result Value Ref Range Status   Specimen Description BLOOD RIGHT ANTECUBITAL  Final   Special Requests   Final    BOTTLES DRAWN AEROBIC AND ANAEROBIC Blood Culture adequate volume   Culture   Final    NO GROWTH 2 DAYS Performed at [facility], [address] [place], [place] [address]    Report Status PENDING  Incomplete  Culture, blood (Routine X 2) w Reflex to ID Panel     Status: None (Preliminary result)   Collection Time: 11/25/22  9:48 PM   Specimen: BLOOD  Result Value Ref Range Status   Specimen Description BLOOD BLOOD RIGHT FOREARM  Final   Special Requests   Final    BOTTLES DRAWN AEROBIC AND ANAEROBIC Blood Culture adequate volume   Culture   Final    NO GROWTH 2 DAYS Performed at [facility], [address] [place], [place] [address]    Report Status PENDING  Incomplete  C Difficile Quick Screen w PCR reflex     Status: None   Collection Time: 11/26/22  4:00 PM   Specimen: STOOL  Result Value Ref Range Status   C Diff antigen NEGATIVE NEGATIVE Final   C Diff toxin NEGATIVE NEGATIVE Final   C Diff interpretation No C. difficile detected.  Final    Comment: Performed at [facility], [address] [place], [place] [address]  Gastrointestinal Panel by PCR , Stool     Status: None   Collection Time: 11/26/22  4:00 PM   Specimen: Stool  Result Value Ref Range Status   Campylobacter species NOT DETECTED NOT DETECTED Final   Plesimonas shigelloides NOT DETECTED NOT DETECTED Final   Salmonella species NOT DETECTED NOT DETECTED Final   Yersinia enterocolitica NOT DETECTED NOT DETECTED Final   Vibrio species NOT DETECTED NOT DETECTED Final   Vibrio cholerae NOT DETECTED NOT DETECTED Final    Enteroaggregative E coli (EAEC) NOT DETECTED NOT DETECTED Final   Enteropathogenic E coli (EPEC) NOT DETECTED NOT DETECTED Final   Enterotoxigenic E coli (ETEC) NOT DETECTED NOT DETECTED Final   Shiga like toxin producing E coli (STEC) NOT DETECTED NOT DETECTED Final   Shigella/Enteroinvasive E coli (EIEC) NOT DETECTED NOT DETECTED Final   Cryptosporidium NOT DETECTED NOT DETECTED Final   Cyclospora cayetanensis NOT DETECTED NOT DETECTED Final   Entamoeba histolytica NOT DETECTED NOT DETECTED  Final   Giardia lamblia NOT DETECTED NOT DETECTED Final   Adenovirus F40/41 NOT DETECTED NOT DETECTED Final   Astrovirus NOT DETECTED NOT DETECTED Final   Norovirus GI/GII NOT DETECTED NOT DETECTED Final   Rotavirus A NOT DETECTED NOT DETECTED Final   Sapovirus (I, II, IV, and V) NOT DETECTED NOT DETECTED Final    Comment: Performed at [facility], [address] [place], [place] [address]    Coagulation Studies: No results for input(s): "LABPROT", "INR" in the last 72 hours.  Urinalysis: Recent Labs    11/25/22 2014  COLORURINE YELLOW*  LABSPEC 1.010  PHURINE 7.0  GLUCOSEU NEGATIVE  HGBUR MODERATE*  BILIRUBINUR NEGATIVE  KETONESUR NEGATIVE  PROTEINUR 100*  NITRITE NEGATIVE  LEUKOCYTESUR LARGE*      Imaging: No results found.   Medications:    lactated ringers 75 mL/hr at 11/28/22 0908   piperacillin-tazobactam (ZOSYN)  IV 2.25 g (11/28/22 0535)    apixaban  2.5 mg Oral BID   Chlorhexidine Gluconate Cloth  6 each Topical Daily   feeding supplement  237 mL Oral TID BM   sodium bicarbonate  1,300 mg Oral BID   sodium chloride flush  3 mL Intravenous Q12H   tamsulosin  0.4 mg Oral Daily   acetaminophen **OR** acetaminophen, morphine injection  Assessment/ Plan:  82 y.o. 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   Patient's baseline creatinine is 1.17 from 09/23/2022 Patient has been evaluated by urologist.  His most recent CT scan from 11/25/2022 showed moderate right and mild left hydronephrosis with no obvious obstructing stone. Good urine output obtained after Foley was placed. Urine output of about 2900 cc last 24 hours. Electrolytes and volume status is acceptable.  No acute indication for dialysis at present Continue on maintenance LR at 75 cc an hour for now.  Once oral intake is adequate, IV fluids can be discontinued.  #Gross hematuria Secondary to Eliquis and bladder distention injury. Urine appears to be clearing now  #Acute metabolic acidosis Sodium bicarbonate level has improved significantly.  May discontinue oral supplementation.  #Hypokalemia Ordered oral potassium supplement.    LOS: 3 Firman Petrow Thedore Mins 9/2/202410:54 AM  [facility] [place], [place] [phone]  Note: This note was prepared with Dragon dictation. Any transcription errors are unintentional

## 2022-11-28 NOTE — Progress Notes (Addendum)
[facility] Progress Note  Patient: Steven Welch    MWU:[id number]  DOA: 11/25/2022     Date of Service: the patient was seen and examined on 11/28/2022  Chief Complaint  Patient presents with   Fall   Brief hospital course: Steven Welch is a 82 y.o. male with PMH of paroxysmal A-fib on Eliquis, sick sinus syndrome, hypertension, frequent falls, status post left shoulder surgery, seen by cardiology on 6/24 as reviewed from EMR, presented at [facility] ED with fall at home from standing position and striking his head while trying to get in the recliner.  Patient's wife reported to ED physician that patient is having worsening of weakness and confusion over the past week since discharge.  Patient is AO x 1, following command.  Unable to offer any complaints. ED workup: VS heart rate 57, BP stable, saturating well on room air Sodium 132, CO2 20, BUN 108 creatinine 8.0 Lactic acid 1.3 within normal range, WBC 12.9 elevated UA positive EKG sinus bradycardia with right BBB, inferior T wave inversion. CT head negative for any acute findings CT C-spine negative for any acute trauma CT A/P: 1. Moderate right and mild left hydroureteronephrosis. No urinary calculi. 2. Foley catheter within the bladder. The bladder is not well evaluated due to streak artifact. There may be some bladder wall thickening and perivesical stranding. Locules of gas either within the bladder lumen or wall. Correlate for cystitis. 3. Small right pleural effusion with pleural thickening similar to CT chest 04/09/2021. US renal: Mild bilateral hydronephrosis. Layering debris within the urinary bladder. Urine culture and blood culture collected  Assessment and Plan:  Sepsis due to UTI UA positive, Continue IV fluid for hydration Continue Zosyn Pharmacy consulted for antibiotic management Blood culture NGTD Urine culture growing Klebsiella oxytoca, follow sensitivity report   Urinary retention with bilateral  hydronephrosis Foley catheter inserted, developed gross hematuria could be secondary to distended urinary bladder and traumatic Foley catheter insertion. 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 Held Eliquis due to hematuria, resumed on 9/1, hematuria resolved. Held hydrochlorothiazide due to AKI Resume metoprolol and hydralazine if hypertensive Monitor BP and titrate medication accordingly Continue to monitor on telemetry  Hematochezia noticed at night on 9/1 Hb 11.5---10.7 slightly low Started pantoprazole 40 mg IV BID x 3 days, followed by oral twice daily Monitor H&H GI consulted, d/w his family, recommended conservative management, and continue Eliquis.  Fall at home most likely secondary to renal failure Continue fall precautions PT and OT eval   Body mass index is 28.94 kg/m.  Interventions:  Diet: Renal diet DVT Prophylaxis: SCD, pharmacological prophylaxis contraindicated due to bleeding    Advance goals of care discussion: Full code  Family Communication: family was not present at bedside, at the time of interview.  The pt provided permission to discuss medical plan with the family. Opportunity was given to ask question and all questions were answered satisfactorily.  9/1 Discussed with family over the phone  Disposition:  Pt is  from  Home, admitted with renal failure, hematuria, obstructive uropathy, sepsis due to UTI, still has elevated creatinine, on IV antibiotics, which precludes a safe discharge. Discharge to home with home health versus SNF, TBD after PT/OT eval, when stable, may need 3 to 5 days.  Subjective: No significant events overnight, RN noticed hematochezia, informed to nocturnist.  Patient was resting comfortably, AO x 1 and denied any complaints.  Physical Exam: General: NAD, lying comfortably Appear in no distress, affect appropriate Eyes: PERRLA ENT: Oral Mucosa Clear, moist  Neck: no JVD,  Cardiovascular: S1 and S2 Present, no Murmur,  Respiratory: good respiratory effort, Bilateral Air entry equal and Decreased, no Crackles, no wheezes Abdomen: Bowel Sound present, Soft and no tenderness,  Skin: no rashes Extremities: 2+ Pedal edema, no calf tenderness Neurologic: without any new focal findings Gait not checked due to patient safety concerns  Vitals:   11/28/22 0407 11/28/22 0433 11/28/22 0901 11/28/22 1255  BP: 125/74  126/67 130/72  Pulse: 84  74 73  Resp: 16  16 16   Temp: 98 F (36.7 C)  98.2 F (36.8 C) (!) 97.5 F (36.4 C)  TempSrc: Oral  Oral Oral  SpO2: 96%  98% 100%  Weight:  91.5 kg    Height:        Intake/Output Summary (Last 24 hours) at 11/28/2022 1531 Last data filed at 11/28/2022 1200 Gross per 24 hour  Intake 1495.53 ml  Output 2600 ml  Net -1104.47 ml   Filed Weights   11/25/22 2231 11/26/22 0718 11/28/22 0433  Weight: 96.2 kg 96.2 kg 91.5 kg    Data Reviewed: I have personally reviewed and interpreted daily labs, tele strips, imagings as discussed above. I reviewed all nursing notes, pharmacy notes, vitals, pertinent old records I have discussed plan of care as described above with RN and patient/family.  CBC: Recent Labs  Lab 11/25/22 1546 11/26/22 0500 11/27/22 0557 11/28/22 0115 11/28/22 0501  WBC 12.8* 12.9* 9.3  --  7.4  NEUTROABS 10.3*  --   --    --   --   HGB 12.4* 13.1 11.8* 11.5* 10.7*  HCT 37.0* 39.5 35.3* 34.3* 30.8*  MCV 92.7 93.8 92.4  --  89.5  PLT 275 252 216  --  233   Basic Metabolic Panel: Recent Labs  Lab 11/25/22 1546 11/26/22 0910 11/27/22 0557 11/28/22 0501  NA 132* 136 135 142  K 4.6 4.7 4.0 3.3*  CL 99 103 104 105  CO2 20* 20* 20* 25  GLUCOSE 138* 118* 94 106*  BUN 108* 116* 107* 98*  CREATININE 8.08* 8.01* 7.18* 6.09*  CALCIUM 9.2 8.5* 8.4* 8.1*  MG 2.4 1.8 2.4 2.2  PHOS  --  5.6* 5.6* 4.5    Studies: No results found.  Scheduled Meds:  apixaban  2.5 mg Oral BID   Chlorhexidine Gluconate Cloth  6 each Topical Daily   feeding supplement  237 mL Oral TID BM   potassium chloride  20 mEq Oral BID   sodium chloride flush  3 mL Intravenous Q12H   tamsulosin  0.4 mg Oral Daily   Continuous Infusions:  lactated ringers 75 mL/hr at 11/28/22 0908   piperacillin-tazobactam (ZOSYN)  IV 2.25 g (11/28/22 1301)   PRN Meds: acetaminophen **OR** acetaminophen, morphine injection  Time spent: 40 minutes  Author: Gillis Santa. MD [facility] 11/28/2022 3:31 PM  To reach On-call, see care teams to locate the attending and reach out to them via [email or web address]. If 7PM-7AM, please  contact night-coverage If you still have difficulty reaching the attending provider, please page the [facility] (Director on Call) for [facility] on amion for assistance.

## 2022-11-28 NOTE — Consult Note (Signed)
Inpatient Consultation   Patient ID: Steven Welch is a 82 y.o. male.  Requesting Provider: Lindajo Royal, MD  Date of Admission: 11/25/2022  Date of Consult: 11/28/22   Reason for Consultation: Bloody bowel movement  Patient's Chief Complaint:   Chief Complaint  Patient presents with   Fall    82 year old Caucasian male with suspected dementia, recurrent falls, sick sinus syndrome, A-fib on Eliquis who presented to the hospital with increasing weakness and confusion since his fall requiring left shoulder surgery in June.  Gastroenterology is consulted for overnight bowel movement with blood.  Unfortunate the patient is unable to provide much information and only able answer simple questions.  Much of the information is garnered via chart review and discussion with the patient's family over the phone and obtained from the nurse.  Patient was noted to have sepsis secondary to UTI and urinary retention with bilateral nephrosis.  He was found to have hematuria with drifting hemoglobin from 13 baseline to 10.7.  Eliquis was discontinued for a few days and then restarted last night.  After restart he was found to have blood associated with his bowel movement.  He has had no further episodes and bowel movements have previously been brown.  Increased amount of stool was noted on CT on initial presentation.  Previously on Eliquis.  Held for hematuria.  Resumed yesterday evening and held again after blood assisted with stool Denies NSAIDs, Anti-plt agents Denies family history of gastrointestinal disease and malignancy Previous Endoscopies: None   Past Medical History:  Diagnosis Date   Anxiety    Aortic atherosclerosis (HCC)    Asymptomatic bradycardia    Cyst of breast, right 1981   Gastric ulcer    GERD (gastroesophageal reflux disease)    Hepatitis 1988   History of shingles    Hyperlipidemia    Hypertension    Long term current use of anticoagulant    a.) apixaban    Osteoarthritis    PAF (paroxysmal atrial fibrillation) (HCC)    a.) CHA2DS2VASc = 4 (age x2, HTN, vascular disease history);  b.) rate/rhythm maintained on oral metoprolol tartrate; chronically anticoagulated with apixaban   RBBB (right bundle branch block)    Recurrent falls    Skin cancer of face    SSS (sick sinus syndrome) (HCC)     Past Surgical History:  Procedure Laterality Date   BREAST EXCISIONAL BIOPSY Right 1981   neg   COLON SURGERY     age 73 month old   REVERSE SHOULDER ARTHROPLASTY Left 09/22/2022   Procedure: REVERSE SHOULDER ARTHROPLASTY with BICEPS TENODESIS;  Surgeon: Christena Flake, MD;  Location: [facility] ORS;  Service: Orthopedics;  Laterality: Left;   TONSILLECTOMY     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   TOTAL HIP ARTHROPLASTY Right 01/01/2019   Procedure: TOTAL HIP ARTHROPLASTY ANTERIOR APPROACH;  Surgeon: Kennedy Bucker, MD;  Location: [facility] ORS;  Service: Orthopedics;  Laterality: Right;    No Known Allergies  Family History  Problem Relation Age of Onset   CAD Mother    Lung cancer Father     Social History   Tobacco Use   Smoking status: Never   Smokeless tobacco: Never  Vaping Use   Vaping status: Never Used  Substance Use Topics   Alcohol use: Not Currently   Drug use: Never     Pertinent GI related history and allergies were reviewed with the patient  Review of Systems  Unable to perform ROS: Dementia  Medications Home Medications No current facility-administered medications on file prior to encounter.   Current Outpatient Medications on File Prior to Encounter  Medication Sig Dispense Refill   acetaminophen (TYLENOL) 500 MG tablet Take 2 tablets (1,000 mg total) by mouth every 6 (six) hours as needed (pain). 60 tablet 0   apixaban (ELIQUIS) 2.5 MG TABS tablet Take 1 tablet (2.5 mg total) by mouth 2 (two) times daily.     hydrALAZINE (APRESOLINE) 25 MG tablet Take 1 tablet by mouth 3 (three) times daily.     hydrochlorothiazide  (HYDRODIURIL) 25 MG tablet Take 25 mg by mouth daily.     lidocaine (LIDODERM) 5 % Place 1 patch onto the skin every 12 (twelve) hours. Remove & Discard patch within 12 hours or as directed by MD 10 patch 0   melatonin 3 MG TABS tablet Take 6 mg by mouth at bedtime.     Menthol, Topical Analgesic, (ICY HOT ORIGINAL PAIN RELIEF) 2.5 % GEL Apply topically as needed.     metoprolol succinate (TOPROL-XL) 25 MG 24 hr tablet Take 0.5 tablets (12.5 mg total) by mouth daily.     ondansetron (ZOFRAN) 4 MG tablet Take 1 tablet (4 mg total) by mouth every 6 (six) hours as needed for nausea. 30 tablet 0   Pertinent GI related medications were reviewed with the patient  Inpatient Medications  Current Facility-Administered Medications:    acetaminophen (TYLENOL) tablet 650 mg, 650 mg, Oral, Q6H PRN, 650 mg at 11/26/22 1757 **OR** acetaminophen (TYLENOL) suppository 650 mg, 650 mg, Rectal, Q6H PRN, Gertha Calkin, MD   apixaban (ELIQUIS) tablet 2.5 mg, 2.5 mg, Oral, BID, Gillis Santa, MD   Chlorhexidine Gluconate Cloth 2 % PADS 6 each, 6 each, Topical, Daily, Gillis Santa, MD, 6 each at 11/27/22 1000   feeding supplement (ENSURE ENLIVE / ENSURE PLUS) liquid 237 mL, 237 mL, Oral, TID BM, Gillis Santa, MD, 237 mL at 11/28/22 1300   lactated ringers infusion, , Intravenous, Continuous, Gillis Santa, MD, Last Rate: 75 mL/hr at 11/28/22 0908, New Bag at 11/28/22 0908   morphine (PF) 2 MG/ML injection 2 mg, 2 mg, Intravenous, Q4H PRN, Gertha Calkin, MD   pantoprazole (PROTONIX) injection 40 mg, 40 mg, Intravenous, Q12H **FOLLOWED BY** [START ON 12/01/2022] pantoprazole (PROTONIX) EC tablet 40 mg, 40 mg, Oral, BID, Lucianne Muss, Dileep, MD   piperacillin-tazobactam (ZOSYN) IVPB 2.25 g, 2.25 g, Intravenous, Q8H, Patel, Ekta V, MD, Last Rate: 100 mL/hr at 11/28/22 1301, 2.25 g at 11/28/22 1301   potassium chloride (KLOR-CON) packet 20 mEq, 20 mEq, Oral, BID, Singh, Harmeet, MD, 20 mEq at 11/28/22 1255   sodium chloride flush  (NS) 0.9 % injection 3 mL, 3 mL, Intravenous, Q12H, Irena Cords V, MD, 3 mL at 11/28/22 0908   tamsulosin (FLOMAX) capsule 0.4 mg, 0.4 mg, Oral, Daily, Ray Church III, MD, 0.4 mg at 11/28/22 0908  lactated ringers 75 mL/hr at 11/28/22 0908   piperacillin-tazobactam (ZOSYN)  IV 2.25 g (11/28/22 1301)    acetaminophen **OR** acetaminophen, morphine injection   Objective   Vitals:   11/28/22 0433 11/28/22 0901 11/28/22 1255 11/28/22 1540  BP:  126/67 130/72 (!) 140/74  Pulse:  74 73 75  Resp:  16 16 16   Temp:  98.2 F (36.8 C) (!) 97.5 F (36.4 C) 97.9 F (36.6 C)  TempSrc:  Oral Oral Oral  SpO2:  98% 100% 97%  Weight: 91.5 kg     Height:  Physical Exam Vitals and nursing note reviewed.  Constitutional:      General: He is not in acute distress.    Appearance: He is ill-appearing. He is not toxic-appearing or diaphoretic.  HENT:     Head: Normocephalic and atraumatic.     Nose: Nose normal.     Mouth/Throat:     Mouth: Mucous membranes are moist.     Pharynx: Oropharynx is clear.  Eyes:     General: No scleral icterus.    Extraocular Movements: Extraocular movements intact.  Cardiovascular:     Rate and Rhythm: Normal rate and regular rhythm.     Heart sounds: Normal heart sounds. No murmur heard.    No friction rub. No gallop.  Pulmonary:     Effort: Pulmonary effort is normal. No respiratory distress.     Breath sounds: Normal breath sounds. No wheezing, rhonchi or rales.  Abdominal:     General: Bowel sounds are normal. There is no distension.     Palpations: Abdomen is soft.     Tenderness: There is no abdominal tenderness. There is no guarding or rebound.  Genitourinary:    Comments: Foley in place with some red sediment in bag  Digital rectal exam Digital rectal exam performed with patient permission. Chaperone Nurse Victorino Dike present during exam. Preserved resting sphincter tone,  No prolapse, perianal lesion or skin tags, fissure, fistula,  stricture, mass or abscess. No external hemorrhoids; Brown stool present on exam and on the patient's sheet. No melena or hematochezia present brown stool in vault Prostate and/or other pelvic structures not assessed.   Musculoskeletal:     Cervical back: Neck supple.  Skin:    General: Skin is warm and dry.     Coloration: Skin is not jaundiced or pale.  Neurological:     Mental Status: He is alert.     Comments: Able to answer simple questions and follow directions     Laboratory Data Recent Labs  Lab 11/26/22 0500 11/27/22 0557 11/28/22 0115 11/28/22 0501  WBC 12.9* 9.3  --  7.4  HGB 13.1 11.8* 11.5* 10.7*  HCT 39.5 35.3* 34.3* 30.8*  PLT 252 216  --  233   Recent Labs  Lab 11/25/22 1546 11/26/22 0910 11/27/22 0557 11/28/22 0501  NA 132* 136 135 142  K 4.6 4.7 4.0 3.3*  CL 99 103 104 105  CO2 20* 20* 20* 25  BUN 108* 116* 107* 98*  CALCIUM 9.2 8.5* 8.4* 8.1*  PROT 7.7  --   --   --   BILITOT 1.0  --   --   --   ALKPHOS 54  --   --   --   ALT 16  --   --   --   AST 16  --   --   --   GLUCOSE 138* 118* 94 106*   No results for input(s): "INR" in the last 168 hours.  No results for input(s): "LIPASE" in the last 72 hours.      Imaging Studies: Reviewed ct from this hospitalization   Assessment:   # one episode of blood with stool - previous BM had been regular - dre demonstrating brown stool in the vault without melena or hematochezia - given increased stool burden on CT suspect relating to constipation and probable anal outlet etiology - has declined previous colonoscopy  # Anemia - hematuria present- hgb from 13.1 to 10.7  # AKI on CKD  # AFIB, SSS  # Hematuria -  urinary retention with bilateraly hydronephrosis  # sepsis 2/2 uti - on abx eing treated by primary team  Plan:  Spoke with patient's daughter and wife on the phone No further bleeding demonstrated; brown stool is noted on exam and on patient sheet Has had ongoing  hematuria CT demonstrating diveritculosis, increased stool burden and HH Pt has declined colonoscopy in the past and family does not want colonoscopy at this time. Discussed if colonoscopy was performed and things like malignancy found, they would not likely want further intervention given his physical and mental status Suspected dementia- family reports he did not know his wife today. Their preference is for conservative management at this time. Can continue bid ppi given oac use  GI to sign off. Available as needed. Please do not hesitate to call regarding questions or concerns. Can call back if further bleeding arises to rediscuss endoscopic evaluation with family  I personally performed the service.  Management of other medical comorbidities as per primary team  Thank you for allowing [place] to participate in this patient's care. Please don't hesitate to call if any questions or concerns arise.   Jaynie Collins, DO [facility]  Portions of the record may have been created with voice recognition software. Occasional wrong-word or 'sound-a-like' substitutions may have occurred due to the inherent limitations of voice recognition software.  Read the chart carefully and recognize, using context, where substitutions may have occurred.

## 2022-11-28 NOTE — Evaluation (Signed)
Occupational Therapy Evaluation Patient Details Name: Steven Welch MRN: [id number] DOB: 1940/05/18 Today's Date: 11/28/2022   History of Present Illness 82 y.o. male with PMH of paroxysmal A-fib on Eliquis, sick sinus syndrome, hypertension, frequent falls, status post left shoulder surgery, seen by cardiology on 6/24 as reviewed from EMR, presented at [facility] ED with fall at home from standing position and striking his head while trying to get in the recliner.  Patient's wife reported to ED physician that patient is having worsening of weakness and confusion over the past week   Clinical Impression   Pt was seen for OT evaluation this date. Pt pleasant, oriented to self and generally to hospital. Unsure where his wife is and whether she knows where he is. Chart review indicates that pt recently required [facility] and wife assist for in-home mobility and wife was assisting with ADL. Pt positioned upright in bed, set up for grooming and to drink coffee. Noted pt had soiled linens. Nursing notified and further mobility attempts deferred. Pt presents to acute OT demonstrating impaired ADL performance and functional mobility 2/2 noted deficits (See OT problem list). Pt currently requires setup for grooming and anticipate at least MOD A for LB ADL tasks. Pt would benefit from skilled OT services to address noted impairments and functional limitations (see below for any additional details) in order to maximize safety and independence while minimizing falls risk and caregiver burden.     If plan is discharge home, recommend the following: Two people to help with walking and/or transfers;A lot of help with bathing/dressing/bathroom;Direct supervision/assist for medications management    Functional Status Assessment  Patient has had a recent decline in their functional status and demonstrates the ability to make significant improvements in function in a reasonable and predictable amount of time.  Equipment  Recommendations  Other (comment) (defer to next venue)    Recommendations for Other Services       Precautions / Restrictions Precautions Precautions: Fall Restrictions Weight Bearing Restrictions: No      Mobility Bed Mobility               General bed mobility comments: deferred, pt noted with large BM in bed, nursing called to assist    Transfers                          Balance                                           ADL either performed or assessed with clinical judgement   ADL                                         General ADL Comments: Pt set up for grooming tasks long sitting in bed, set up for drinking coffee. Min spillage noted. Anticipate pt requires at least MOD A for LB ADL tasks but formal assessment limited this date.     Vision         Perception         Praxis         Pertinent Vitals/Pain Pain Assessment Pain Assessment: No/denies pain     Extremity/Trunk Assessment Upper Extremity Assessment Upper Extremity Assessment: Generalized weakness   Lower Extremity Assessment Lower Extremity  Assessment: Generalized weakness       Communication Communication Communication: No apparent difficulties   Cognition Arousal: Alert Behavior During Therapy: Flat affect Overall Cognitive Status: No family/caregiver present to determine baseline cognitive functioning                                 General Comments: Pt pleasant, conversational, follows simple commands, oriented to self and generally to hospital, reports being unsure where his wife is and whether she knows that he's in the hospital     General Comments       Exercises     Shoulder Instructions      Home Living Family/patient expects to be discharged to:: Unsure                                 Additional Comments: per prior admission ~3 weeks ago: Wife states that HHOT/HHPT was ordered, but  she did not want them to come, so she sent them away; now knows that she would've benefitted from that assistance. Pt uses incontinence briefs at baseline per wife.      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        OT Problem List: Decreased strength;Decreased cognition;Decreased safety awareness;Decreased activity tolerance;Decreased knowledge of use of DME or AE      OT Treatment/Interventions: Self-care/ADL training;Therapeutic exercise;Therapeutic activities;DME and/or AE instruction;Patient/family education;Balance training    OT Goals(Current goals can be found in the care plan section) Acute Rehab OT Goals Patient Stated Goal: go home OT Goal Formulation: With patient Time For Goal Achievement: 12/12/22 Potential to Achieve Goals: Good ADL Goals Pt Will Perform Grooming: sitting;with supervision;with set-up Pt Will Transfer to Toilet: with max assist;stand pivot transfer;bedside commode (LRAD) Pt Will Perform Toileting - Clothing Manipulation and hygiene: sitting/lateral leans;with mod assist  OT Frequency: Min 1X/week    Co-evaluation              AM-PAC OT "6 Clicks" Daily Activity     Outcome Measure Help from another person eating meals?: None Help from another person taking care of personal grooming?: None Help from another person toileting, which includes using toliet, bedpan, or urinal?: A Lot Help from another person bathing (including washing, rinsing, drying)?: A Lot Help from another person to put on and taking off regular upper body clothing?: A Lot Help from another person to put on and taking off regular lower body clothing?: A Lot 6  Click Score: 16   End of Session Nurse Communication: Other (comment) (Pt soiled)  Activity Tolerance: Patient tolerated treatment well Patient left: in bed;with call bell/phone within reach;with bed alarm set  OT Visit Diagnosis: Other abnormalities of gait and mobility (R26.89);Muscle weakness (generalized) (M62.81)                Time: 0955-1006 OT Time Calculation (min): 11 min Charges:  OT General Charges $OT Visit: 1 Visit OT Evaluation $OT Eval Low Complexity: 1 Low  Arman Filter., MPH, MS, OTR/L ascom  [phone] 11/28/22, 10:49 AM

## 2022-11-29 DIAGNOSIS — R55 Syncope and collapse: Secondary | ICD-10-CM | POA: Diagnosis not present

## 2022-11-29 DIAGNOSIS — W19XXXA Unspecified fall, initial encounter: Secondary | ICD-10-CM | POA: Diagnosis not present

## 2022-11-29 DIAGNOSIS — R42 Dizziness and giddiness: Secondary | ICD-10-CM | POA: Diagnosis not present

## 2022-11-29 LAB — BASIC METABOLIC PANEL
Anion gap: 12 (ref 5–15)
BUN: 83 mg/dL — ABNORMAL HIGH (ref 8–23)
CO2: 25 mmol/L (ref 22–32)
Calcium: 8.3 mg/dL — ABNORMAL LOW (ref 8.9–10.3)
Chloride: 108 mmol/L (ref 98–111)
Creatinine, Ser: 4.76 mg/dL — ABNORMAL HIGH (ref 0.61–1.24)
GFR, Estimated: 12 mL/min — ABNORMAL LOW (ref >60)
Glucose, Bld: 106 mg/dL — ABNORMAL HIGH (ref 70–99)
Potassium: 3.3 mmol/L — ABNORMAL LOW (ref 3.5–5.1)
Sodium: 145 mmol/L (ref 135–145)

## 2022-11-29 LAB — CBC
HCT: 31.8 % — ABNORMAL LOW (ref 39.0–52.0)
Hemoglobin: 10.5 g/dL — ABNORMAL LOW (ref 13.0–17.0)
MCH: 30.7 pg (ref 26.0–34.0)
MCHC: 33 g/dL (ref 30.0–36.0)
MCV: 93 fL (ref 80.0–100.0)
Platelets: 240 10*3/uL (ref 150–400)
RBC: 3.42 MIL/uL — ABNORMAL LOW (ref 4.22–5.81)
RDW: 14.5 % (ref 11.5–15.5)
WBC: 7.8 10*3/uL (ref 4.0–10.5)
nRBC: 0 % (ref 0.0–0.2)

## 2022-11-29 LAB — PHOSPHORUS: Phosphorus: 3.6 mg/dL (ref 2.5–4.6)

## 2022-11-29 LAB — MAGNESIUM: Magnesium: 2 mg/dL (ref 1.7–2.4)

## 2022-11-29 MED ORDER — POTASSIUM CHLORIDE CRYS ER 20 MEQ PO TBCR
40.0000 meq | EXTENDED_RELEASE_TABLET | Freq: Once | ORAL | Status: AC
  Administered 2022-11-29: 40 meq via ORAL
  Filled 2022-11-29: qty 2

## 2022-11-29 NOTE — Progress Notes (Addendum)
Physical Therapy Treatment Patient Details Name: Marcelo Mcconnel MRN: [id number] DOB: [birth year] Today's Date: 11/29/2022   History of Present Illness 82 y.o. male with PMH of paroxysmal A-fib on Eliquis, sick sinus syndrome, hypertension, frequent falls, status post left shoulder surgery, seen by cardiology on 6/24 as reviewed from EMR, presented at [facility] ED with fall at home from standing position and striking his head while trying to get in the recliner.  Patient's wife reported to ED physician that patient is having worsening of weakness and confusion over the past week    PT Comments  Pt presents laying in bed, no complaints of pain. Pt performed supine>sit MaxA, sit>supine MaxAx2, sit<>stand maxAx2/ RW. PT noted prominent posterior lean when transitioning from sit>stand, requiring multimodal cueing for forward weight shifting and achieve upright. Pt attempted 4 stage balance and was unable to maintain semi-tandem with 1 LOB requiring mod-maxA from therapist to correct. Pt also voiced some lightheadedness with initial standing which resolved with a seated rest break. He would benefit from continued skilled care to maximize functional abilities.    If plan is discharge home, recommend the following: Two people to help with walking and/or transfers;Assistance with cooking/housework;Two people to help with bathing/dressing/bathroom;Assist for transportation;Help with stairs or ramp for entrance;Direct supervision/assist for medications management;Direct supervision/assist for financial management   Can travel by private vehicle     No  Equipment Recommendations  Other (comment) (TBD at next venue of care)    Recommendations for Other Services       Precautions / Restrictions Precautions Precautions: Fall Restrictions Weight Bearing Restrictions: No Other Position/Activity Restrictions: S/P L RSA (6/27) - Per MD note  "exercises will include active and passive range of motion exercises as well  as progressive resistive strengthening exercises. He may wean out of the sling at this time and progress with his normal daily activities as symptoms permit. However, he is to avoid offending activities"     Mobility  Bed Mobility Overal bed mobility: Needs Assistance Bed Mobility: Supine to Sit, Sit to Supine     Supine to sit: Max assist Sit to supine: Max assist, +2 for physical assistance        Transfers Overall transfer level: Needs assistance Equipment used: Rolling walker (2 wheels) Transfers: Sit to/from Stand Sit to Stand: Max assist, +2 physical assistance           General transfer comment: prominent posterior lean to achieve standing, requiring maxA and multimodal cueing for forward weight shift to complete    Ambulation/Gait               General Gait Details: unable at this time   Stairs             Wheelchair Mobility     Tilt Bed    Modified Rankin (Stroke Patients Only)       Balance Overall balance assessment: Needs assistance Sitting-balance support: Bilateral upper extremity supported Sitting balance-Leahy Scale: Poor Sitting balance - Comments: overall requiring maxA to maintain upright, intermittently minA/CGA Postural control: Posterior lean Standing balance support: Bilateral upper extremity supported, During functional activity, Reliant on assistive device for balance Standing balance-Leahy Scale: Poor                 High Level Balance Comments: 4 stage balance: unable to maintain semi-tandem >/=10s without LOB, required UE assist on RW to get into position            Cognition Arousal: Alert Behavior During Therapy: Flat  affect Overall Cognitive Status: No family/caregiver present to determine baseline cognitive functioning                                          Exercises General Exercises - Lower Extremity Hip Flexion/Marching: Seated, Both, 10 reps, AROM    General Comments  General comments (skin integrity, edema, etc.): Some dizziness/ lightheadedness upon standing which resolved following seated rest break      Pertinent Vitals/Pain Pain Assessment Pain Assessment: No/denies pain    Home Living                          Prior Function            PT Goals (current goals can now be found in the care plan section) Progress towards PT goals: Progressing toward goals    Frequency    Min 1X/week      PT Plan      Co-evaluation              AM-PAC PT "6 Clicks" Mobility   Outcome Measure  Help needed turning from your back to your side while in a flat bed without using bedrails?: A Lot Help needed moving from lying on your back to sitting on the side of a flat bed without using bedrails?: A Lot Help needed moving to and from a bed to a chair (including a wheelchair)?: A Lot Help needed standing up from a chair using your arms (e.g., wheelchair or bedside chair)?: A Lot Help needed to walk in hospital room?: Total Help needed climbing 3-5 steps with a railing? : Total 6 Click Score: 10    End of Session Equipment Utilized During Treatment: Gait belt Activity Tolerance: Patient limited by fatigue Patient left: with bed alarm set;with call bell/phone within reach;in bed   PT Visit Diagnosis: Muscle weakness (generalized) (M62.81);Difficulty in walking, not elsewhere classified (R26.2);History of falling (Z91.81)     Time: 1344-1410 PT Time Calculation (min) (ACUTE ONLY): 26 min  Charges:    $Therapeutic Activity: 23-37 mins PT General Charges $$ ACUTE PT VISIT: 1 Visit                    Cleotha Whalin, PT, SPT 3:47 PM,11/29/22

## 2022-11-29 NOTE — [place] FL2 (Signed)
[place] MEDICAID FL2 LEVEL OF CARE FORM     IDENTIFICATION  Patient Name: Steven Welch Birthdate: May 04, 1940 Sex: male Admission Date (Current Location): 11/25/2022  [place] and [place] Number:  Chiropodist and Address:  [facility], [address] [place], [place] [address]      Provider Number: [id number]  Attending Physician Name and Address:  Gillis Santa, MD  Relative Name and Phone Number:  Darikson, Nagy (Spouse)  [phone] [facility])    Current Level of Care: Hospital Recommended Level of Care: Skilled Nursing Facility Prior Approval Number:    Date Approved/Denied:   PASRR Number: [id number]  Discharge Plan: Home    Current Diagnoses: Patient Active Problem List   Diagnosis Date Noted   Gross hematuria 11/25/2022   Fall 11/25/2022   Sepsis secondary to UTI (HCC) 11/25/2022   Rectal bleeding 10/19/2022   Hypokalemia 10/15/2022   Insomnia 10/15/2022   Postural dizziness with presyncope 10/14/2022   AKI (acute kidney injury) (HCC) 10/14/2022   Symptomatic bradycardia 10/14/2022   PAF (paroxysmal atrial fibrillation) (HCC)    SSS (sick sinus syndrome) (HCC)    Status post reverse arthroplasty of shoulder, 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   Gastro-esophageal reflux disease without esophagitis 04/07/2021   Elevated troponin 04/04/2021   Hypertension    Recurrent falls    Unsteady gait    Sepsis (HCC) 01/07/2021   Atrial fibrillation with rapid ventricular response (HCC) 01/07/2021   Status post total hip replacement, 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    Orientation RESPIRATION BLADDER Height & Weight     Self, Situation, Time, Place  Normal External catheter, Incontinent Weight: 88.7 kg Height:  5\' 10"  (177.8 cm)  BEHAVIORAL SYMPTOMS/MOOD NEUROLOGICAL BOWEL NUTRITION STATUS  Other (Comment) (n/a)  (n/a) Incontinent Diet (Heart)  AMBULATORY STATUS COMMUNICATION OF NEEDS Skin   Limited Assist Verbally Bruising                       Personal  Care Assistance Level of Assistance  Bathing, Dressing Bathing Assistance: Limited assistance   Dressing Assistance: Limited assistance     Functional Limitations Info             SPECIAL CARE FACTORS FREQUENCY  PT (By licensed PT), OT (By licensed OT)     PT Frequency: Min 2x weekly OT Frequency: Min 2x weekly            Contractures      Additional Factors Info  Code Status, Allergies Code Status Info: FULL Allergies Info: No Known Allergies           Current Medications (11/29/2022):  This is the current hospital active medication list Current Facility-Administered Medications  Medication Dose Route Frequency Provider Last Rate Last Admin   acetaminophen (TYLENOL) tablet 650 mg  650 mg Oral Q6H PRN Gertha Calkin, MD   650 mg at 11/26/22 1757   Or   acetaminophen (TYLENOL) suppository 650 mg  650 mg Rectal Q6H PRN Gertha Calkin, MD       apixaban Everlene Balls) tablet 2.5 mg  2.5 mg Oral BID Gillis Santa, MD   2.5 mg at 11/28/22 2124   cefTRIAXone (ROCEPHIN) 1 g in sodium chloride 0.9 % 100 mL IVPB  1 g Intravenous Q24H Gillis Santa, MD   Stopped at 11/28/22 1907   Chlorhexidine Gluconate Cloth 2 % PADS 6 each  6 each Topical Daily Gillis Santa, MD   6 each at 11/28/22 1000   feeding supplement (ENSURE ENLIVE /  ENSURE PLUS) liquid 237 mL  237 mL Oral TID BM Gillis Santa, MD   237 mL at 11/28/22 2125   morphine (PF) 2 MG/ML injection 2 mg  2 mg Intravenous Q4H PRN Gertha Calkin, MD       pantoprazole (PROTONIX) injection 40 mg  40 mg Intravenous Q12H Gillis Santa, MD   40 mg at 11/29/22 0540   Followed by   Melene Muller ON 12/01/2022] pantoprazole (PROTONIX) EC tablet 40 mg  40 mg Oral BID Gillis Santa, MD       potassium chloride SA (KLOR-CON M) CR tablet 40 mEq  40 mEq Oral Once Gillis Santa, MD       sodium chloride flush (NS) 0.9 % injection 3 mL  3 mL Intravenous Q12H Irena Cords V, MD   3 mL at 11/28/22 2125   tamsulosin (FLOMAX) capsule 0.4 mg  0.4 mg Oral Daily  Ray Church III, MD   0.4 mg at 11/28/22 0908     Discharge Medications: Please see discharge summary for a list of discharge medications.  Relevant Imaging Results:  Relevant Lab Results:   Additional Information SS #: [id number]  Truddie Hidden, RN

## 2022-11-29 NOTE — TOC Initial Note (Signed)
Transition of Care [facility]) - Initial/Assessment Note    Patient Details  Name: Steven Welch MRN: [id number] Date of Birth: 12-18-1940  [facility] CM/SW Contact:    Truddie Hidden, RN Phone Number: 11/29/2022, 9:25 AM  Clinical Narrative:                 Spoke with patient's wife regarding therapy's recommendation for SNF. She is agreeable to SNF and bed search. She does not have a preference other than locally. Bed search started for [facility] facilities and [facility].         Patient Goals and CMS Choice            Expected Discharge Plan and Services                                              Prior Living Arrangements/Services                       Activities of Daily Living      Permission Sought/Granted                  Emotional Assessment              Admission diagnosis:  Urinary retention [R33.9] Fall [W19.XXXA] Acute cystitis without hematuria [N30.00] AKI (acute kidney injury) (HCC) [N17.9] Fall, initial encounter [W19.XXXA] Patient Active Problem List   Diagnosis Date Noted   Gross hematuria 11/25/2022   Fall 11/25/2022   Sepsis secondary to UTI (HCC) 11/25/2022   Rectal bleeding 10/19/2022   Hypokalemia 10/15/2022   Insomnia 10/15/2022   Postural dizziness with presyncope 10/14/2022   AKI (acute kidney injury) (HCC) 10/14/2022   Symptomatic bradycardia 10/14/2022   PAF (paroxysmal atrial fibrillation) (HCC)    SSS (sick sinus syndrome) (HCC)    Status post reverse arthroplasty of shoulder, 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 [place] [address] Phone: [phone] Fax: [phone]     Social Determinants of Health (SDOH) Social History: SDOH Screenings   Food Insecurity: No Food Insecurity (10/14/2022)  Housing: Low Risk  (10/14/2022)  Transportation Needs: No Transportation Needs (10/14/2022)  Utilities: Not At Risk (10/14/2022)  Financial Resource Strain: Patient Declined (02/22/2022)   Received from [facility]  Tobacco Use: Low Risk  (11/25/2022)   SDOH Interventions:     Readmission Risk Interventions     No data to display

## 2022-11-29 NOTE — Progress Notes (Signed)
[facility] Progress Note  Patient: Steven Welch    ZOX:[id number]  DOA: 11/25/2022     Date of Service: the patient was seen and examined on 11/29/2022  Chief Complaint  Patient presents with   Fall   Brief hospital course: Steven Welch is a 82 y.o. male with PMH of paroxysmal A-fib on Eliquis, sick sinus syndrome, hypertension, frequent falls, status post left shoulder surgery, seen by cardiology on 6/24 as reviewed from EMR, presented at [facility] ED with fall at home from standing position and striking his head while trying to get in the recliner.  Patient's wife reported to ED physician that patient is having worsening of weakness and confusion over the past week since discharge.  Patient is AO x 1, following command.  Unable to offer any complaints. ED workup: VS heart rate 57, BP stable, saturating well on room air Sodium 132, CO2 20, BUN 108 creatinine 8.0 Lactic acid 1.3 within normal range, WBC 12.9 elevated UA positive EKG sinus bradycardia with right BBB, inferior T wave inversion. CT head negative for any acute findings CT C-spine negative for any acute trauma CT A/P: 1. Moderate right and mild left hydroureteronephrosis. No urinary calculi. 2. Foley catheter within the bladder. The bladder is not well evaluated due to streak artifact. There may be some bladder wall thickening and perivesical stranding. Locules of gas either within the bladder lumen or wall. Correlate for cystitis. 3. Small right pleural effusion with pleural thickening similar to CT chest 04/09/2021. US renal: Mild bilateral hydronephrosis. Layering debris within the urinary bladder. Urine culture and blood culture collected  Assessment and Plan:  Sepsis due to UTI UA positive, urine culture growing Klebsiella oxytoca Continue IV fluid for hydration S/p Zosyn, transition to ceftriaxone on 9/2 Blood culture NGTD   Urinary retention with bilateral hydronephrosis Foley catheter inserted, developed gross  hematuria could be secondary to distended urinary bladder and traumatic Foley catheter insertion. Started Flomax Held Eliquis, hematuria resolved, urologist cleared to start Eliquis on 9/1  Urology following, recommended to keep Foley catheter and follow with an outpatient for voiding trial F/u with Urology on 09/26 at 9:15 with Dr. 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 Held Eliquis due to hematuria, resumed on 9/1, hematuria resolved. Held hydrochlorothiazide due to AKI Resume metoprolol and hydralazine if hypertensive Monitor BP and titrate medication accordingly Continue to monitor on telemetry  Hematochezia noticed at night on 9/1 Hb 11.5---10.5 slightly low Started pantoprazole 40 mg IV BID x 3 days, followed by oral twice daily Monitor H&H GI consulted, d/w his family, recommended conservative management, and continue Eliquis.  Fall at home most likely secondary to renal failure Continue fall precautions PT and OT eval   Body mass index is 28.06 kg/m.  Interventions:  Diet: Renal diet DVT Prophylaxis: SCD, pharmacological prophylaxis contraindicated due to bleeding    Advance goals of care discussion: Full code  Family Communication: family was not present at bedside, at the time of interview.  The pt provided permission to discuss medical plan with the family. Opportunity was given to ask question and all questions were answered satisfactorily.  9/1 Discussed with family over the phone  Disposition:   Pt is from Home, admitted with renal failure, hematuria, obstructive uropathy, sepsis due to UTI, still has elevated creatinine, on IV antibiotics, which precludes a safe discharge. Discharge to SNF, when creatinine improves.  May plan to DC in 2 to 3 days.  Subjective: No significant events overnight, patient was resting comfortably.  Denied any complaints, no chest pain or palpitation, no shortness of breath.   Physical Exam: General: NAD, lying comfortably Appear in no distress, affect appropriate Eyes: PERRLA ENT: Oral Mucosa Clear, moist  Neck: no JVD,  Cardiovascular: S1 and S2 Present, no Murmur,  Respiratory: good respiratory effort, Bilateral Air entry equal and Decreased, no Crackles, no wheezes Abdomen: Bowel Sound present, Soft and no tenderness,  Skin: no rashes Extremities: 2+ Pedal edema, no calf tenderness Neurologic: without any new focal findings Gait not checked due to patient safety concerns  Vitals:   11/29/22 0328 11/29/22 0600 11/29/22 0826 11/29/22 1228  BP: (!) 134/91  132/84 134/77  Pulse: 73  62 72  Resp: 18  16 16   Temp: 97.7 F (36.5 C)  (!) 97.4 F (36.3 C) (!) 97.4 F (36.3 C)  TempSrc: Oral  Oral Oral  SpO2: 97%  98% 99%  Weight:  88.7 kg    Height:        Intake/Output Summary (Last 24 hours) at 11/29/2022 1358 Last data filed at 11/29/2022 1221 Gross per 24 hour  Intake 697 ml  Output 3845 ml  Net -3148 ml   Filed Weights   11/26/22 0718 11/28/22 0433 11/29/22 0600  Weight: 96.2 kg 91.5 kg 88.7 kg    Data Reviewed: I have personally reviewed and interpreted daily labs, tele strips, imagings as discussed above. I reviewed all nursing notes, pharmacy notes, vitals, pertinent old records I have discussed plan of care as described above with RN and patient/family.  CBC: Recent Labs  Lab 11/25/22 1546 11/26/22 0500 11/27/22 0557 11/28/22 0115 11/28/22 0501 11/29/22 0215  WBC 12.8* 12.9* 9.3  --  7.4 7.8  NEUTROABS 10.3*  --   --    --   --   --   HGB 12.4* 13.1 11.8* 11.5* 10.7* 10.5*  HCT 37.0* 39.5 35.3* 34.3* 30.8* 31.8*  MCV 92.7 93.8 92.4  --  89.5 93.0  PLT 275 252 216  --  233 240   Basic Metabolic Panel: Recent Labs  Lab 11/25/22 1546 11/26/22 0910 11/27/22 0557 11/28/22 0501 11/29/22 0215  NA 132* 136 135 142 145  K 4.6 4.7 4.0 3.3* 3.3*  CL 99 103 104 105 108  CO2 20* 20* 20* 25 25  GLUCOSE 138* 118* 94 106* 106*  BUN 108* 116* 107* 98* 83*  CREATININE 8.08* 8.01* 7.18* 6.09* 4.76*  CALCIUM 9.2 8.5* 8.4* 8.1* 8.3*  MG 2.4 1.8 2.4 2.2 2.0  PHOS  --  5.6* 5.6* 4.5 3.6    Studies: No results found.  Scheduled Meds:  apixaban  2.5 mg Oral BID   Chlorhexidine Gluconate Cloth  6 each Topical Daily   feeding supplement  237 mL Oral TID BM   pantoprazole (PROTONIX) IV  40 mg Intravenous Q12H   Followed by   Melene Muller ON 12/01/2022] pantoprazole  40 mg Oral BID   sodium chloride flush  3 mL Intravenous Q12H   tamsulosin  0.4 mg Oral Daily   Continuous Infusions:  cefTRIAXone (ROCEPHIN)  IV Stopped (11/28/22 1907)   PRN Meds: acetaminophen **OR** acetaminophen,  morphine injection  Time spent: 40 minutes  Author: Gillis Santa. MD [facility] 11/29/2022 1:58 PM  To reach On-call, see care teams to locate the attending and reach out to them via [email or web address]. If 7PM-7AM, please contact night-coverage If you still have difficulty reaching the attending provider, please page the [facility] (Director on Call) for [facility] on amion for assistance.

## 2022-11-29 NOTE — Progress Notes (Signed)
[facility] [place] [facility], [place] 11/29/22  Subjective:   Hospital day # 4  Patient was recently admitted for a syncopal event and discharged from hospital on 10/21/2022.  During previous admission he was noted to have AKI of unclear cause.  His renal ultrasound showed mild bilateral hydronephrosis without obstruction.  Creatinine was 3.02 at the time of discharge and improving.   Renal: 09/02 0701 - 09/03 0700 In: 697 [P.O.:597; IV Piggyback:100] Out: 3795 [Urine:3795] Lab Results  Component Value Date   CREATININE 4.76 (H) 11/29/2022   CREATININE 6.09 (H) 11/28/2022   CREATININE 7.18 (H) 11/27/2022   Patient seen resting in bed Drowsy Denies pain  Foley catheter remains in place  Creatinine improving  UOP  Objective:  Vital signs in last 24 hours:  Temp:  [97.4 F (36.3 C)-97.9 F (36.6 C)] 97.4 F (36.3 C) (09/03 1228) Pulse Rate:  [62-83] 72 (09/03 1228) Resp:  [16-18] 16 (09/03 1228) BP: (126-141)/(74-91) 134/77 (09/03 1228) SpO2:  [95 %-99 %] 99 % (09/03 1228) Weight:  [88.7 kg] 88.7 kg (09/03 0600)  Weight change: -2.8 kg Filed Weights   11/26/22 0718 11/28/22 0433 11/29/22 0600  Weight: 96.2 kg 91.5 kg 88.7 kg    Intake/Output:    Intake/Output Summary (Last 24 hours) at 11/29/2022 1449 Last data filed at 11/29/2022 1221 Gross per 24 hour  Intake 697 ml  Output 3845 ml  Net -3148 ml     Physical Exam: General: NAD, laying in the bed  HEENT Moist oral mucous membranes  Pulm/lungs Normal breathing effort, coarse breath sounds  CVS/Heart Irregular rhythm  Abdomen:  Soft, nontender  Extremities: Trace to 1+ edema  Neurologic: Able to answer simple questions and follow commands  Skin: No acute rashes   Foley catheter in place       Basic Metabolic Panel:  Recent Labs  Lab 11/25/22 1546 11/26/22 0910 11/27/22 0557 11/28/22 0501 11/29/22 0215  NA 132* 136 135 142 145  K 4.6 4.7 4.0 3.3* 3.3*  CL 99 103 104 105 108   CO2 20* 20* 20* 25 25  GLUCOSE 138* 118* 94 106* 106*  BUN 108* 116* 107* 98* 83*  CREATININE 8.08* 8.01* 7.18* 6.09* 4.76*  CALCIUM 9.2 8.5* 8.4* 8.1* 8.3*  MG 2.4 1.8 2.4 2.2 2.0  PHOS  --  5.6* 5.6* 4.5 3.6     CBC: Recent Labs  Lab 11/25/22 1546 11/26/22 0500 11/27/22 0557 11/28/22 0115 11/28/22 0501 11/29/22 0215  WBC 12.8* 12.9* 9.3  --  7.4 7.8  NEUTROABS 10.3*  --   --   --   --   --   HGB 12.4* 13.1 11.8* 11.5* 10.7* 10.5*  HCT 37.0* 39.5 35.3* 34.3* 30.8* 31.8*  MCV 92.7 93.8 92.4  --  89.5 93.0  PLT 275 252 216  --  233 240     No results found for: "HEPBSAG", "HEPBSAB", "HEPBIGM"    Microbiology:  Recent Results (from the past 240 hour(s))  Urine Culture     Status: Abnormal   Collection Time: 11/25/22  3:46 PM   Specimen: Urine, Catheterized  Result Value Ref Range Status   Specimen Description   Final    URINE, CATHETERIZED Performed at [facility], [address] [place], [place] [address]    Special Requests   Final    NONE Performed at [facility], [address] [place], [place] [address]    Culture >=100,000 COLONIES/mL KLEBSIELLA OXYTOCA (A)  Final  Report Status 11/28/2022 FINAL  Final   Organism ID, Bacteria KLEBSIELLA OXYTOCA (A)  Final      Susceptibility   Klebsiella oxytoca - MIC*    AMPICILLIN >=32 RESISTANT Resistant     CEFEPIME <=0.12 SENSITIVE Sensitive     CEFTRIAXONE <=0.25 SENSITIVE Sensitive     CIPROFLOXACIN <=0.25 SENSITIVE Sensitive     GENTAMICIN <=1 SENSITIVE Sensitive     IMIPENEM <=0.25 SENSITIVE Sensitive     NITROFURANTOIN 32 SENSITIVE Sensitive     TRIMETH/SULFA <=20 SENSITIVE Sensitive     AMPICILLIN/SULBACTAM 8 SENSITIVE Sensitive     PIP/TAZO <=4 SENSITIVE Sensitive     * >=100,000 COLONIES/mL KLEBSIELLA OXYTOCA  Culture, blood (Routine X 2) w Reflex to ID Panel     Status: None (Preliminary result)   Collection Time: 11/25/22  9:48 PM   Specimen: BLOOD  Result Value Ref Range  Status   Specimen Description BLOOD RIGHT ANTECUBITAL  Final   Special Requests   Final    BOTTLES DRAWN AEROBIC AND ANAEROBIC Blood Culture adequate volume   Culture   Final    NO GROWTH 3 DAYS Performed at [facility], [address] [place], [place] [address]    Report Status PENDING  Incomplete  Culture, blood (Routine X 2) w Reflex to ID Panel     Status: None (Preliminary result)   Collection Time: 11/25/22  9:48 PM   Specimen: BLOOD  Result Value Ref Range Status   Specimen Description BLOOD BLOOD RIGHT FOREARM  Final   Special Requests   Final    BOTTLES DRAWN AEROBIC AND ANAEROBIC Blood Culture adequate volume   Culture   Final    NO GROWTH 3 DAYS Performed at [facility], [address], [place], [place] [address]    Report Status PENDING  Incomplete  C Difficile Quick Screen w PCR reflex     Status: None   Collection Time: 11/26/22  4:00 PM   Specimen: STOOL  Result Value Ref Range Status   C Diff antigen NEGATIVE NEGATIVE Final   C Diff toxin NEGATIVE NEGATIVE Final   C Diff interpretation No C. difficile detected.  Final    Comment: Performed at [facility], [address] [place], [place] [address]  Gastrointestinal Panel by PCR , Stool     Status: None   Collection Time: 11/26/22  4:00 PM   Specimen: Stool  Result Value Ref Range Status   Campylobacter species NOT DETECTED NOT DETECTED Final   Plesimonas shigelloides NOT DETECTED NOT DETECTED Final   Salmonella species NOT DETECTED NOT DETECTED Final   Yersinia enterocolitica NOT DETECTED NOT DETECTED Final   Vibrio species NOT DETECTED NOT DETECTED Final   Vibrio cholerae NOT DETECTED NOT DETECTED Final   Enteroaggregative E coli (EAEC) NOT DETECTED NOT DETECTED Final   Enteropathogenic E coli (EPEC) NOT DETECTED NOT DETECTED Final   Enterotoxigenic E coli (ETEC) NOT DETECTED NOT DETECTED Final   Shiga like toxin producing E coli (STEC) NOT DETECTED NOT DETECTED Final    Shigella/Enteroinvasive E coli (EIEC) NOT DETECTED NOT DETECTED Final   Cryptosporidium NOT DETECTED NOT DETECTED Final   Cyclospora cayetanensis NOT DETECTED NOT DETECTED Final   Entamoeba histolytica NOT DETECTED NOT DETECTED Final   Giardia lamblia NOT DETECTED NOT DETECTED Final   Adenovirus F40/41 NOT DETECTED NOT DETECTED Final   Astrovirus NOT DETECTED NOT DETECTED Final   Norovirus GI/GII NOT DETECTED NOT DETECTED Final   Rotavirus A NOT DETECTED  NOT DETECTED Final   Sapovirus (I, II, IV, and V) NOT DETECTED NOT DETECTED Final    Comment: Performed at [facility], [address] [place], [place] [address]    Coagulation Studies: No results for input(s): "LABPROT", "INR" in the last 72 hours.  Urinalysis: No results for input(s): "COLORURINE", "LABSPEC", "PHURINE", "GLUCOSEU", "HGBUR", "BILIRUBINUR", "KETONESUR", "PROTEINUR", "UROBILINOGEN", "NITRITE", "LEUKOCYTESUR" in the last 72 hours.  Invalid input(s): "APPERANCEUR"     Imaging: No results found.   Medications:    cefTRIAXone (ROCEPHIN)  IV Stopped (11/28/22 1907)    apixaban  2.5 mg Oral BID   Chlorhexidine Gluconate Cloth  6 each Topical Daily   feeding supplement  237 mL Oral TID BM   pantoprazole (PROTONIX) IV  40 mg Intravenous Q12H   Followed by   Melene Muller ON 12/01/2022] pantoprazole  40 mg Oral BID   sodium chloride flush  3 mL Intravenous Q12H   tamsulosin  0.4 mg Oral Daily   acetaminophen **OR** acetaminophen, morphine injection  Assessment/ Plan:  82 y.o. male with history of atrial fibrillation, recent reverse arthroplasty of left shoulder 09/22/2022 by Dr Joice Lofts, hypertension admitted on 11/25/2022 for Urinary retention [R33.9] Fall [W19.XXXA] Acute cystitis without hematuria [N30.00] AKI (acute kidney injury) (HCC) [N17.9] Fall, initial encounter [W19.XXXA]   #Acute kidney injury secondary to acute urinary retention and bilateral hydronephrosis.   Patient's baseline creatinine is 1.17  from 09/23/2022 Patient has been evaluated by urologist.  His most recent CT scan from 11/25/2022 showed moderate right and mild left hydronephrosis with no obvious obstructing stone.  Creatinine continues to improve, 4.76 from 6.09, adequate urine output noted in Foley. No acute indication for dialysis. IVF stopped at midnight. Would continue fluids, if no complaints of shortness of breath.   #Gross hematuria Secondary to Eliquis and bladder distention injury. Urine remains clear  #Acute metabolic acidosis Sodium bicarbonate corrected with oral supplementation.   #Hypokalemia Ordered oral potassium supplement.    LOS: 4 Steven Welch 9/3/20242:49 PM  [address] [place], [place] [phone]

## 2022-11-30 DIAGNOSIS — I10 Essential (primary) hypertension: Secondary | ICD-10-CM

## 2022-11-30 DIAGNOSIS — I480 Paroxysmal atrial fibrillation: Secondary | ICD-10-CM

## 2022-11-30 DIAGNOSIS — K219 Gastro-esophageal reflux disease without esophagitis: Secondary | ICD-10-CM

## 2022-11-30 DIAGNOSIS — N179 Acute kidney failure, unspecified: Secondary | ICD-10-CM

## 2022-11-30 DIAGNOSIS — A419 Sepsis, unspecified organism: Principal | ICD-10-CM

## 2022-11-30 DIAGNOSIS — R310 Gross hematuria: Secondary | ICD-10-CM

## 2022-11-30 DIAGNOSIS — N390 Urinary tract infection, site not specified: Secondary | ICD-10-CM

## 2022-11-30 DIAGNOSIS — W19XXXA Unspecified fall, initial encounter: Secondary | ICD-10-CM | POA: Diagnosis not present

## 2022-11-30 LAB — BASIC METABOLIC PANEL
Anion gap: 13 (ref 5–15)
BUN: 64 mg/dL — ABNORMAL HIGH (ref 8–23)
CO2: 27 mmol/L (ref 22–32)
Calcium: 8.8 mg/dL — ABNORMAL LOW (ref 8.9–10.3)
Chloride: 105 mmol/L (ref 98–111)
Creatinine, Ser: 3.9 mg/dL — ABNORMAL HIGH (ref 0.61–1.24)
GFR, Estimated: 15 mL/min — ABNORMAL LOW (ref >60)
Glucose, Bld: 97 mg/dL (ref 70–99)
Potassium: 3.4 mmol/L — ABNORMAL LOW (ref 3.5–5.1)
Sodium: 145 mmol/L (ref 135–145)

## 2022-11-30 LAB — CBC
HCT: 35.2 % — ABNORMAL LOW (ref 39.0–52.0)
Hemoglobin: 11.7 g/dL — ABNORMAL LOW (ref 13.0–17.0)
MCH: 30.9 pg (ref 26.0–34.0)
MCHC: 33.2 g/dL (ref 30.0–36.0)
MCV: 92.9 fL (ref 80.0–100.0)
Platelets: 278 10*3/uL (ref 150–400)
RBC: 3.79 MIL/uL — ABNORMAL LOW (ref 4.22–5.81)
RDW: 14.4 % (ref 11.5–15.5)
WBC: 8.3 10*3/uL (ref 4.0–10.5)
nRBC: 0 % (ref 0.0–0.2)

## 2022-11-30 NOTE — Progress Notes (Addendum)
Physical Therapy Treatment Patient Details Name: Steven Welch MRN: [id number] DOB: 1940/04/16 Today's Date: 11/30/2022   History of Present Illness 82 y.o. male with PMH of paroxysmal A-fib on Eliquis, sick sinus syndrome, hypertension, frequent falls, status post left shoulder surgery, seen by cardiology on 6/24 as reviewed from EMR, presented at [facility] ED with fall at home from standing position and striking his head while trying to get in the recliner.  Patient's wife reported to ED physician that patient is having worsening of weakness and confusion over the past week    PT Comments  PT/OT cotreat performed to maximize functional abilities and for patient/therapist safety.   Pt presents laying in bed, no complaints of pain. He performed supine>sit with maxAx2 for completion of movement due to prominent posterior push when transitioning to sitting EOB. Sitting balance overall poor with a prominent posterior/lateral lean, fluctuating between maxA-CGA from therapist to correct and repeated cueing for upright sitting. Pt also performed sit<>stand with minAx2, and repeated cueing for forward lean/hand placement. He required maximal encouragement and multimodal cueing throughout treatment to maintain participation, and refused to transfer to the recliner.  Pt would benefit from continued skilled care to maximize functional abilities.    If plan is discharge home, recommend the following: A lot of help with walking and/or transfers;Assistance with cooking/housework;Direct supervision/assist for medications management;Help with stairs or ramp for entrance;Assist for transportation;A lot of help with bathing/dressing/bathroom   Can travel by private vehicle     No  Equipment Recommendations  Other (comment) (TBD at next venue)    Recommendations for Other Services       Precautions / Restrictions Precautions Precautions: Fall Restrictions Weight Bearing Restrictions: No Other Position/Activity  Restrictions: S/P L RSA (6/27) - Per MD note "exercises will include active and passive range of motion exercises as well as progressive resistive strengthening exercises. He may wean out of the sling at this time and progress with his normal daily activities as symptoms permit. However, he is to avoid offending activities"     Mobility  Bed Mobility Overal bed mobility: Needs Assistance Bed Mobility: Supine to Sit, Sit to Supine     Supine to sit: Max assist, +2 for physical assistance Sit to supine: Min assist   General bed mobility comments: maxAx2 for completion of movement, prominent posterior push when transitioning to sitting EOB. Max Multimodal cueing for sit>supine for pt to contribute to movement    Transfers Overall transfer level: Needs assistance Equipment used: Rolling walker (2 wheels) Transfers: Sit to/from Stand Sit to Stand: Min assist, +2 physical assistance, +2 safety/equipment           General transfer comment: repeated VC for forward lean and hand placement, improved standing balance    Ambulation/Gait               General Gait Details: unable at this time   Stairs             Wheelchair Mobility     Tilt Bed    Modified Rankin (Stroke Patients Only)       Balance     Sitting balance-Leahy Scale: Poor Sitting balance - Comments: Prominent posterior push and L lateral lean. Fluctuating maxA-CGA in intial sitting, able to sit with supervision following standing activities Postural control: Posterior lean, Left lateral lean Standing balance support: Bilateral upper extremity supported, During functional activity, Reliant on assistive device for balance Standing balance-Leahy Scale: Poor Standing balance comment: difficulty reaching full upright, able to briefly  correct with cueing but returns to forward flexed posture                            Cognition Arousal: Alert Behavior During Therapy: Flat affect Overall  Cognitive Status: No family/caregiver present to determine baseline cognitive functioning                                 General Comments: Pt overall resistant to mobility today, requiring maximal encouragement from PT for pariticpation        Exercises General Exercises - Lower Extremity Long Arc Quad: Strengthening, Both, 20 reps, Seated Hip Flexion/Marching: AROM, Both, 10 reps, Standing (with RW) Other Exercises Other Exercises: 3 lateral steps towards [facility] with RW/CGA for safety    General Comments        Pertinent Vitals/Pain      Home Living                          Prior Function            PT Goals (current goals can now be found in the care plan section) Progress towards PT goals: Progressing toward goals    Frequency    Min 1X/week      PT Plan      Co-evaluation PT/OT/SLP Co-Evaluation/Treatment: Yes Reason for Co-Treatment: For patient/therapist safety;To address functional/ADL transfers PT goals addressed during session: Mobility/safety with mobility;Balance OT goals addressed during session: ADL's and self-care      AM-PAC PT "6 Clicks" Mobility   Outcome Measure  Help needed turning from your back to your side while in a flat bed without using bedrails?: A Lot Help needed moving from lying on your back to sitting on the side of a flat bed without using bedrails?: A Lot Help needed moving to and from a bed to a chair (including a wheelchair)?: A Lot Help needed standing up from a chair using your arms (e.g., wheelchair or bedside chair)?: A Lot Help needed to walk in hospital room?: A Lot Help needed climbing 3-5 steps with a railing? : A Lot 6 Click Score: 12    End of Session Equipment Utilized During Treatment: Gait belt Activity Tolerance: Patient tolerated treatment well Patient left: in bed;with call bell/phone within reach;with bed alarm set   PT Visit Diagnosis: Muscle weakness (generalized)  (M62.81);Difficulty in walking, not elsewhere classified (R26.2);History of falling (Z91.81)     Time: 1610-9604 PT Time Calculation (min) (ACUTE ONLY): 24 min  Charges:    $Therapeutic Activity: 8-22 mins PT General Charges $$ ACUTE PT VISIT: 1 Visit                     Nicolo Tomko, PT, SPT 3:44 PM,11/30/22

## 2022-11-30 NOTE — Progress Notes (Signed)
[facility] [place], [place] 11/30/22  Subjective:   Hospital day # 5  Patient was recently admitted for a syncopal event and discharged from hospital on 10/21/2022.  During previous admission he was noted to have AKI of unclear cause.  His renal ultrasound showed mild bilateral hydronephrosis without obstruction.  Creatinine was 3.02 at the time of discharge and improving.   Renal: 09/03 0701 - 09/04 0700 In: 480 [P.O.:480] Out: 2800 [Urine:2800] Lab Results  Component Value Date   CREATININE 3.90 (H) 11/30/2022   CREATININE 4.76 (H) 11/29/2022   CREATININE 6.09 (H) 11/28/2022   Patient sitting up in bed No family present Alert and able to answer simple questions Room air, denies shortness of breath  UOP  Objective:  Vital signs in last 24 hours:  Temp:  [97.6 F (36.4 C)-98.8 F (37.1 C)] 97.6 F (36.4 C) (09/04 1223) Pulse Rate:  [59-82] 61 (09/04 1223) Resp:  [16-18] 16 (09/04 1223) BP: (134-149)/(69-86) 134/69 (09/04 1223) SpO2:  [96 %-100 %] 98 % (09/04 1223) Weight:  [85.5 kg] 85.5 kg (09/04 0400)  Weight change: -3.2 kg Filed Weights   11/28/22 0433 11/29/22 0600 11/30/22 0400  Weight: 91.5 kg 88.7 kg 85.5 kg    Intake/Output:    Intake/Output Summary (Last 24 hours) at 11/30/2022 1512 Last data filed at 11/30/2022 1429 Gross per 24 hour  Intake 480 ml  Output 2500 ml  Net -2020 ml     Physical Exam: General: NAD, laying in the bed  HEENT Moist oral mucous membranes  Pulm/lungs Normal breathing effort, coarse breath sounds  CVS/Heart Irregular rhythm  Abdomen:  Soft, nontender  Extremities: Trace to 1+ edema  Neurologic: Able to answer simple questions and follow commands  Skin: No acute rashes          Basic Metabolic Panel:  Recent Labs  Lab 11/25/22 1546 11/26/22 0910 11/27/22 0557 11/28/22 0501 11/29/22 0215 11/30/22 0605  NA 132* 136 135 142 145 145  K 4.6 4.7 4.0 3.3* 3.3* 3.4*  CL 99 103 104 105 108  105  CO2 20* 20* 20* 25 25 27   GLUCOSE 138* 118* 94 106* 106* 97  BUN 108* 116* 107* 98* 83* 64*  CREATININE 8.08* 8.01* 7.18* 6.09* 4.76* 3.90*  CALCIUM 9.2 8.5* 8.4* 8.1* 8.3* 8.8*  MG 2.4 1.8 2.4 2.2 2.0  --   PHOS  --  5.6* 5.6* 4.5 3.6  --      CBC: Recent Labs  Lab 11/25/22 1546 11/26/22 0500 11/27/22 0557 11/28/22 0115 11/28/22 0501 11/29/22 0215 11/30/22 0605  WBC 12.8* 12.9* 9.3  --  7.4 7.8 8.3  NEUTROABS 10.3*  --   --   --   --   --   --   HGB 12.4* 13.1 11.8* 11.5* 10.7* 10.5* 11.7*  HCT 37.0* 39.5 35.3* 34.3* 30.8* 31.8* 35.2*  MCV 92.7 93.8 92.4  --  89.5 93.0 92.9  PLT 275 252 216  --  233 240 278     No results found for: "HEPBSAG", "HEPBSAB", "HEPBIGM"    Microbiology:  Recent Results (from the past 240 hour(s))  Urine Culture     Status: Abnormal   Collection Time: 11/25/22  3:46 PM   Specimen: Urine, Catheterized  Result Value Ref Range Status   Specimen Description   Final    URINE, CATHETERIZED Performed at [facility], [address] [place], [place] [address]    Special Requests   Final  NONE Performed at [facility], [address] [place], [place] [address]    Culture >=100,000 COLONIES/mL KLEBSIELLA OXYTOCA (A)  Final   Report Status 11/28/2022 FINAL  Final   Organism ID, Bacteria KLEBSIELLA OXYTOCA (A)  Final      Susceptibility   Klebsiella oxytoca - MIC*    AMPICILLIN >=32 RESISTANT Resistant     CEFEPIME <=0.12 SENSITIVE Sensitive     CEFTRIAXONE <=0.25 SENSITIVE Sensitive     CIPROFLOXACIN <=0.25 SENSITIVE Sensitive     GENTAMICIN <=1 SENSITIVE Sensitive     IMIPENEM <=0.25 SENSITIVE Sensitive     NITROFURANTOIN 32 SENSITIVE Sensitive     TRIMETH/SULFA <=20 SENSITIVE Sensitive     AMPICILLIN/SULBACTAM 8 SENSITIVE Sensitive     PIP/TAZO <=4 SENSITIVE Sensitive     * >=100,000 COLONIES/mL KLEBSIELLA OXYTOCA  Culture, blood (Routine X 2) w Reflex to ID Panel     Status: None (Preliminary result)    Collection Time: 11/25/22  9:48 PM   Specimen: BLOOD  Result Value Ref Range Status   Specimen Description BLOOD RIGHT ANTECUBITAL  Final   Special Requests   Final    BOTTLES DRAWN AEROBIC AND ANAEROBIC Blood Culture adequate volume   Culture   Final    NO GROWTH 4 DAYS Performed at [facility], [address], [place], [place] [address]    Report Status PENDING  Incomplete  Culture, blood (Routine X 2) w Reflex to ID Panel     Status: None (Preliminary result)   Collection Time: 11/25/22  9:48 PM   Specimen: BLOOD  Result Value Ref Range Status   Specimen Description BLOOD BLOOD RIGHT FOREARM  Final   Special Requests   Final    BOTTLES DRAWN AEROBIC AND ANAEROBIC Blood Culture adequate volume   Culture   Final    NO GROWTH 4 DAYS Performed at [facility], [address] [place], [place] [address]    Report Status PENDING  Incomplete  C Difficile Quick Screen w PCR reflex     Status: None   Collection Time: 11/26/22  4:00 PM   Specimen: STOOL  Result Value Ref Range Status   C Diff antigen NEGATIVE NEGATIVE Final   C Diff toxin NEGATIVE NEGATIVE Final   C Diff interpretation No C. difficile detected.  Final    Comment: Performed at [facility], [address] [place], [place] [address]  Gastrointestinal Panel by PCR , Stool     Status: None   Collection Time: 11/26/22  4:00 PM   Specimen: Stool  Result Value Ref Range Status   Campylobacter species NOT DETECTED NOT DETECTED Final   Plesimonas shigelloides NOT DETECTED NOT DETECTED Final   Salmonella species NOT DETECTED NOT DETECTED Final   Yersinia enterocolitica NOT DETECTED NOT DETECTED Final   Vibrio species NOT DETECTED NOT DETECTED Final   Vibrio cholerae NOT DETECTED NOT DETECTED Final   Enteroaggregative E coli (EAEC) NOT DETECTED NOT DETECTED Final   Enteropathogenic E coli (EPEC) NOT DETECTED NOT DETECTED Final   Enterotoxigenic E coli (ETEC) NOT DETECTED NOT DETECTED Final    Shiga like toxin producing E coli (STEC) NOT DETECTED NOT DETECTED Final   Shigella/Enteroinvasive E coli (EIEC) NOT DETECTED NOT DETECTED Final   Cryptosporidium NOT DETECTED NOT DETECTED Final   Cyclospora cayetanensis NOT DETECTED NOT DETECTED Final   Entamoeba histolytica NOT DETECTED NOT DETECTED Final   Giardia lamblia NOT DETECTED NOT DETECTED Final   Adenovirus F40/41 NOT DETECTED  NOT DETECTED Final   Astrovirus NOT DETECTED NOT DETECTED Final   Norovirus GI/GII NOT DETECTED NOT DETECTED Final   Rotavirus A NOT DETECTED NOT DETECTED Final   Sapovirus (I, II, IV, and V) NOT DETECTED NOT DETECTED Final    Comment: Performed at [facility], [address] [place], [place] [address]    Coagulation Studies: No results for input(s): "LABPROT", "INR" in the last 72 hours.  Urinalysis: No results for input(s): "COLORURINE", "LABSPEC", "PHURINE", "GLUCOSEU", "HGBUR", "BILIRUBINUR", "KETONESUR", "PROTEINUR", "UROBILINOGEN", "NITRITE", "LEUKOCYTESUR" in the last 72 hours.  Invalid input(s): "APPERANCEUR"     Imaging: No results found.   Medications:    cefTRIAXone (ROCEPHIN)  IV 1 g (11/29/22 1820)    apixaban  2.5 mg Oral BID   Chlorhexidine Gluconate Cloth  6 each Topical Daily   feeding supplement  237 mL Oral TID BM   pantoprazole (PROTONIX) IV  40 mg Intravenous Q12H   Followed by   Melene Muller ON 12/01/2022] pantoprazole  40 mg Oral BID   sodium chloride flush  3 mL Intravenous Q12H   tamsulosin  0.4 mg Oral Daily   acetaminophen **OR** acetaminophen, morphine injection  Assessment/ Plan:  82 y.o. male with history of atrial fibrillation, recent reverse arthroplasty of left shoulder 09/22/2022 by Dr Joice Lofts, hypertension admitted on 11/25/2022 for Urinary retention [R33.9] Fall [W19.XXXA] Acute cystitis without hematuria [N30.00] AKI (acute kidney injury) (HCC) [N17.9] Fall, initial encounter [W19.XXXA]   #Acute kidney injury secondary to acute urinary  retention and bilateral hydronephrosis.   Patient's baseline creatinine is 1.17 from 09/23/2022 Patient has been evaluated by urologist.  His most recent CT scan from 11/25/2022 showed moderate right and mild left hydronephrosis with no obvious obstructing stone.  Creatinine continues to recover. Foley catheter removed, adequate urine output recorded.  No acute indication for dialysis.   #Gross hematuria Secondary to Eliquis and bladder distention injury. Urine clear  #Acute metabolic acidosis Sodium bicarbonate corrected   #Hypokalemia Ordered oral potassium supplement.    LOS: 5 [facility] 9/4/20243:12 PM  [facility] [address] [place], [place] [phone]

## 2022-11-30 NOTE — Progress Notes (Signed)
Progress Note   Patient: Steven Welch DGU:[id number] DOB: 02-Oct-1940 DOA: 11/25/2022     5 DOS: the patient was seen and examined on 11/30/2022   Brief hospital course: Harvis Ratzlaff is a 82 y.o. male with PMH of paroxysmal A-fib on Eliquis, sick sinus syndrome, hypertension, frequent falls, status post left shoulder surgery, seen by cardiology on 6/24 as reviewed from EMR, presented at [facility] ED with fall at home from standing position and striking his head while trying to get in the recliner.  Patient's wife reported to ED physician that patient is having worsening of weakness and confusion over the past week since discharge.  Patient is AO x 1, following command.  Unable to offer any complaints. ED workup: VS heart rate 57, BP stable, saturating well on room air Sodium 132, CO2 20, BUN 108 creatinine 8.0 Lactic acid 1.3 within normal range, WBC 12.9 elevated UA positive EKG sinus bradycardia with right BBB, inferior T wave inversion. CT head negative for any acute findings CT C-spine negative for any acute trauma CT A/P: 1. Moderate right and mild left hydroureteronephrosis. No urinary calculi. 2. Foley catheter within the bladder. The bladder is not well evaluated due to streak artifact. There may be some bladder wall thickening and perivesical stranding. Locules of gas either within the bladder lumen or wall. Correlate for cystitis. 3. Small right pleural effusion with pleural thickening similar to CT chest 04/09/2021. US renal: Mild bilateral hydronephrosis. Layering debris within the urinary bladder. Urine culture and blood culture collected   Assessment and Plan:   Sepsis due to UTI UA positive, urine culture growing Klebsiella oxytoca S/p Zosyn, transition to ceftriaxone on 9/2 Blood culture NGTD.   Urinary retention with bilateral hydronephrosis Foley catheter inserted, developed gross hematuria could be secondary to distended urinary bladder and traumatic Foley catheter  insertion. Started Flomax Held Eliquis, hematuria resolved, urologist cleared to start Eliquis on 9/1  Urology following, recommended to keep Foley catheter and follow with an outpatient for voiding trial F/u with Urology on 09/26 at 9:15 with Dr. 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 Monitor renal function and urine output Avoid nephrotoxic medication, use renally dose medications Nephrology on board, patient may not need hemodialysis as there is improvement in renal function Creatinine 7.18--3.9 gradually improving   Paroxysmal A-fib, HTN, sick sinus syndrome. Held Eliquis due to hematuria, resumed on 9/1, hematuria resolved. Held hydrochlorothiazide due to AKI Monitor BP and titrate medication accordingly, resume BP meds if needed. Continue to monitor on telemetry   Hematochezia noticed at night on 9/1 Continue pantoprazole 40 mg IV BID x 3 days, followed by oral twice daily Monitor H&H GI consulted, d/w his family, recommended conservative management, and continue Eliquis.   Fall at home most likely secondary to renal failure Continue fall precautions PT and OT recommended SNF. TOC working on placement   Diet: Renal diet DVT Prophylaxis: SCD, pharmacological prophylaxis contraindicated due to bleeding     Advance goals of care discussion: Full code     Subjective: Patient is seen and examined today morning, he denies any complaints, able to talk in one word. Eating fair. Did not get out of bed.  Physical Exam: Vitals:   11/30/22 0400 11/30/22 0844 11/30/22 1223 11/30/22 1627  BP: (!) 148/77 (!) 140/86 134/69 (!) 150/88  Pulse: 68 (!) 59 61 66  Resp:  18 16 16 16   Temp: 98.3 F (36.8 C) 97.8 F (36.6 C) 97.6 F (36.4 C) 98 F (36.7 C)  TempSrc:  Oral Oral Oral  SpO2: 100% 97% 98% 98%  Weight: 85.5 kg      Height:       General -Elderly Caucasian male, lying in no apparent distress HEENT - PERRLA, EOMI, atraumatic head, non tender sinuses. Lung -CTA, bibasilar Rales Heart - S1, S2 heard, no murmurs, rubs, 1+ pedal edema Neuro - Alert, awake and oriented, non focal exam. Skin - Warm and dry.  Data Reviewed:     Latest Ref Rng & Units 11/30/2022    6:05 AM 11/29/2022    2:15 AM 11/28/2022    5:01 AM  CBC  WBC 4.0 - 10.5 K/uL 8.3  7.8  7.4   Hemoglobin 13.0 - 17.0 g/dL 16.1  09.6  04.5   Hematocrit 39.0 - 52.0 % 35.2  31.8  30.8   Platelets 150 - 400 K/uL 278  240  233        Latest Ref Rng & Units 11/30/2022    6:05 AM 11/29/2022    2:15 AM 11/28/2022    5:01 AM  BMP  Glucose 70 - 99 mg/dL 97  409  811   BUN 8 - 23 mg/dL 64  83  98   Creatinine 0.61 - 1.24 mg/dL 9.14  7.82  9.56   Sodium 135 - 145 mmol/L 145  145  142   Potassium 3.5 - 5.1 mmol/L 3.4  3.3  3.3   Chloride 98 - 111 mmol/L 105  108  105   CO2 22 - 32 mmol/L 27  25  25    Calcium 8.9 - 10.3 mg/dL 8.8  8.3  8.1    No results found.   Family Communication: TOC contacted wife for SNF placement.  Disposition: Status is: Inpatient Remains inpatient appropriate because: SNF placement once kidney function improves.  Planned Discharge Destination: Skilled nursing facility    Time spent: 42 minutes  Author: Marcelino Duster, MD 11/30/2022 9:21 PM  For on call review [email or web address].

## 2022-11-30 NOTE — TOC Progression Note (Signed)
Transition of Care [facility]) - Progression Note    Patient Details  Name: Steven Welch MRN: [id number] Date of Birth: [birth year]  Transition of [facility]) CM/SW Contact  [name], RN Phone Number: 11/30/2022, 4:44 PM  Clinical Narrative:     Bed offers presented to wife She accepts bed at [facility] Accepted in HUB and notified Tanya at [facility]        Expected Discharge Plan and Services                                               Social Determinants of Health (SDOH) Interventions SDOH Screenings   Food Insecurity: No Food Insecurity (10/14/2022)  Housing: Low Risk  (10/14/2022)  Transportation Needs: No Transportation Needs (10/14/2022)  Utilities: Not At Risk (10/14/2022)  Financial Resource Strain: Patient Declined (02/22/2022)   Received from [facility], [facility]  Tobacco Use: Low Risk  (11/25/2022)    Readmission Risk Interventions     No data to display

## 2022-11-30 NOTE — Progress Notes (Signed)
Occupational Therapy Treatment Patient Details Name: Steven Welch MRN: [id number] DOB: 24-Jul-1940 Today's Date: 11/30/2022   History of present illness 82 y.o. male with PMH of paroxysmal A-fib on Eliquis, sick sinus syndrome, hypertension, frequent falls, status post left shoulder surgery, seen by cardiology on 6/24 as reviewed from EMR, presented at [facility] ED with fall at home from standing position and striking his head while trying to get in the recliner.  Patient's wife reported to ED physician that patient is having worsening of weakness and confusion over the past week   OT comments  Pt seen for OT/PT co-tx this date to maximize therapeutic outcomes and pt/therapist safety. Pt presents in bed, agreeable to session with no c/o pain. Pt required maximum encouragement to participate in mobility, benefiting from multimodal cuing. Performing bed mobility maxA of 2 with significant posterior pushing while transitioning, poor sitting balance with same posterior lean + L lateral push, requires maxA - CGA from therapists and multimodal cuing to maintain orientation to midline in sitting. Sits EOB ~4 mins to wash face with setup, again requiring encouragement to perform self-care himself. Stands with minAx2, lateral steps towards [facility] with initial intent to transfer to recliner but pt refusing. Overall, pt continues to demo significant impairments in occupational performance limiting IND in ADLs and mobility. OT will continue to follow.       If plan is discharge home, recommend the following:  Two people to help with walking and/or transfers;A lot of help with bathing/dressing/bathroom;Direct supervision/assist for medications management   Equipment Recommendations  Other (comment)       Precautions / Restrictions Precautions Precautions: Fall Restrictions Weight Bearing Restrictions: No Other Position/Activity Restrictions: S/P L RSA (6/27) - Per MD note "exercises will include active and passive  range of motion exercises as well as progressive resistive strengthening exercises. He may wean out of the sling at this time and progress with his normal daily activities as symptoms permit. However, he is to avoid offending activities"       Mobility Bed Mobility Overal bed mobility: Needs Assistance Bed Mobility: Supine to Sit, Sit to Supine     Supine to sit: Max assist, +2 for physical assistance Sit to supine: Min assist   General bed mobility comments: maxAx2 for completion of movement, prominent posterior push when transitioning to sitting EOB. Max Multimodal cueing for sit>supine for pt to contribute to movement    Transfers Overall transfer level: Needs assistance Equipment used: Rolling walker (2 wheels) Transfers: Sit to/from Stand Sit to Stand: Min assist, +2 physical assistance, +2 safety/equipment                 Balance Overall balance assessment: Needs assistance Sitting-balance support: Bilateral upper extremity supported Sitting balance-Leahy Scale: Poor Sitting balance - Comments: Prominent posterior push and L lateral lean. Fluctuating maxA-CGA in intial sitting, able to sit with supervision following standing activities. Occassionally with mild trunk unsteadiness/shakiness prior to pushing back posteriorally Postural control: Posterior lean, Left lateral lean Standing balance support: Bilateral upper extremity supported, During functional activity, Reliant on assistive device for balance Standing balance-Leahy Scale: Poor Standing balance comment: significant forward flexed posture, needs multimodal cuing to maintain upright                           ADL either performed or assessed with clinical judgement   ADL Overall ADL's : Needs assistance/impaired     Grooming: Wash/dry face;Wash/dry hands;Sitting  Functional mobility during ADLs:  (Heavy posterior lean, benefits from multimodal cuing,  lateral steps minAx2 towards HOB) General ADL Comments: Session focused on OOB mobility with +2 assist, washes face seated EOB, tolerates sitting EOB for ~4 mins with multimodal cuing for upright posture (tends to sit with posterior / L lean)      Cognition Arousal: Alert Behavior During Therapy: Flat affect Overall Cognitive Status: No family/caregiver present to determine baseline cognitive functioning                                 General Comments: Overall resistant to OOB mobility/therepeutic activities/ADLs, requires max encourgement        Exercises Other Exercises Other Exercises: 3 lateral steps towards [facility] with RW/CGA for safety            Pertinent Vitals/ Pain       Pain Assessment Pain Assessment: No/denies pain   Frequency  Min 1X/week        Progress Toward Goals  OT Goals(current goals can now be found in the care plan section)  Progress towards OT goals: Progressing toward goals  Acute Rehab OT Goals OT Goal Formulation: With patient Time For Goal Achievement: 12/12/22 Potential to Achieve Goals: Good ADL Goals Pt Will Perform Grooming: sitting;with supervision;with set-up Pt Will Transfer to Toilet: with max assist;stand pivot transfer;bedside commode Pt Will Perform Toileting - Clothing Manipulation and hygiene: sitting/lateral leans;with mod assist  Plan      Co-evaluation    PT/OT/SLP Co-Evaluation/Treatment: Yes Reason for Co-Treatment: For patient/therapist safety;To address functional/ADL transfers PT goals addressed during session: Mobility/safety with mobility;Balance OT goals addressed during session: ADL's and self-care      AM-PAC OT "6 Clicks" Daily Activity     Outcome Measure   Help from another person eating meals?: None Help from another person taking care of personal grooming?: None Help from another person toileting, which includes using toliet, bedpan, or urinal?: A Lot Help from another person  bathing (including washing, rinsing, drying)?: A Lot Help from another person to put on and taking off regular upper body clothing?: A Lot Help from another person to put on and taking off regular lower body clothing?: A Lot 6 Click Score: 16    End of Session Equipment Utilized During Treatment: Gait belt;Rolling walker (2 wheels)  OT Visit Diagnosis: Other abnormalities of gait and mobility (R26.89);Muscle weakness (generalized) (M62.81)   Activity Tolerance Patient tolerated treatment well   Patient Left in bed;with call bell/phone within reach;with bed alarm set   Nurse Communication Other (comment)        Time: 1610-9604 OT Time Calculation (min): 24 min  Charges: OT General Charges $OT Visit: 1 Visit OT Treatments $Self Care/Home Management : 8-22 mins  Jynesis Nakamura L. Lexany Belknap, OTR/L  11/30/22, 4:08 PM

## 2022-12-01 DIAGNOSIS — I480 Paroxysmal atrial fibrillation: Secondary | ICD-10-CM | POA: Diagnosis not present

## 2022-12-01 DIAGNOSIS — K219 Gastro-esophageal reflux disease without esophagitis: Secondary | ICD-10-CM | POA: Diagnosis not present

## 2022-12-01 DIAGNOSIS — N179 Acute kidney failure, unspecified: Secondary | ICD-10-CM | POA: Diagnosis not present

## 2022-12-01 DIAGNOSIS — W19XXXA Unspecified fall, initial encounter: Secondary | ICD-10-CM | POA: Diagnosis not present

## 2022-12-01 LAB — CBC
HCT: 32 % — ABNORMAL LOW (ref 39.0–52.0)
Hemoglobin: 10.6 g/dL — ABNORMAL LOW (ref 13.0–17.0)
MCH: 30.4 pg (ref 26.0–34.0)
MCHC: 33.1 g/dL (ref 30.0–36.0)
MCV: 91.7 fL (ref 80.0–100.0)
Platelets: 245 10*3/uL (ref 150–400)
RBC: 3.49 MIL/uL — ABNORMAL LOW (ref 4.22–5.81)
RDW: 14.1 % (ref 11.5–15.5)
WBC: 8.6 10*3/uL (ref 4.0–10.5)
nRBC: 0 % (ref 0.0–0.2)

## 2022-12-01 LAB — BASIC METABOLIC PANEL
Anion gap: 9 (ref 5–15)
BUN: 50 mg/dL — ABNORMAL HIGH (ref 8–23)
CO2: 28 mmol/L (ref 22–32)
Calcium: 8.3 mg/dL — ABNORMAL LOW (ref 8.9–10.3)
Chloride: 104 mmol/L (ref 98–111)
Creatinine, Ser: 3.14 mg/dL — ABNORMAL HIGH (ref 0.61–1.24)
GFR, Estimated: 19 mL/min — ABNORMAL LOW (ref >60)
Glucose, Bld: 103 mg/dL — ABNORMAL HIGH (ref 70–99)
Potassium: 2.8 mmol/L — ABNORMAL LOW (ref 3.5–5.1)
Sodium: 141 mmol/L (ref 135–145)

## 2022-12-01 LAB — CULTURE, BLOOD (ROUTINE X 2)
Culture: NO GROWTH
Culture: NO GROWTH
Special Requests: ADEQUATE
Special Requests: ADEQUATE

## 2022-12-01 MED ORDER — POTASSIUM CHLORIDE 20 MEQ PO PACK
40.0000 meq | PACK | Freq: Two times a day (BID) | ORAL | Status: AC
  Administered 2022-12-01 – 2022-12-02 (×4): 40 meq via ORAL
  Filled 2022-12-01 (×4): qty 2

## 2022-12-01 MED ORDER — POTASSIUM CHLORIDE 10 MEQ/100ML IV SOLN
10.0000 meq | INTRAVENOUS | Status: AC
  Administered 2022-12-01 (×4): 10 meq via INTRAVENOUS
  Filled 2022-12-01 (×4): qty 100

## 2022-12-01 NOTE — Plan of Care (Signed)
  Problem: Pain Managment: Goal: General experience of comfort will improve Outcome: Progressing   Problem: Coping: Goal: Level of anxiety will decrease Outcome: Progressing   Problem: Nutrition: Goal: Adequate nutrition will be maintained Outcome: Progressing   Problem: Safety: Goal: Ability to remain free from injury will improve Outcome: Progressing   Problem: Skin Integrity: Goal: Risk for impaired skin integrity will decrease Outcome: Progressing

## 2022-12-01 NOTE — Progress Notes (Addendum)
Progress Note   Patient: Marlyn Tee ZOX:[id number] DOB: 1940/08/02 DOA: 11/25/2022     6 DOS: the patient was seen and examined on 12/01/2022   Brief hospital course: Marcelles Boroughs is a 82 y.o. male with PMH of paroxysmal A-fib on Eliquis, sick sinus syndrome, hypertension, frequent falls, status post left shoulder surgery, seen by cardiology on 6/24 as reviewed from EMR, presented at [facility] ED with fall at home from standing position and striking his head while trying to get in the recliner.  Patient's wife reported to ED physician that patient is having worsening of weakness and confusion over the past week since discharge.  Patient is AO x 1, following command.  Unable to offer any complaints. ED workup: VS heart rate 57, BP stable, saturating well on room air Sodium 132, CO2 20, BUN 108 creatinine 8.0 Lactic acid 1.3 within normal range, WBC 12.9 elevated UA positive EKG sinus bradycardia with right BBB, inferior T wave inversion. CT head negative for any acute findings CT C-spine negative for any acute trauma CT A/P: 1. Moderate right and mild left hydroureteronephrosis. No urinary calculi. 2. Foley catheter within the bladder. The bladder is not well evaluated due to streak artifact. There may be some bladder wall thickening and perivesical stranding. Locules of gas either within the bladder lumen or wall. Correlate for cystitis. 3. Small right pleural effusion with pleural thickening similar to CT chest 04/09/2021. US renal: Mild bilateral hydronephrosis. Layering debris within the urinary bladder. Urine culture and blood culture collected   Assessment and Plan:   Sepsis due to UTI UA positive, urine culture growing Klebsiella oxytoca S/p Zosyn, transition to ceftriaxone on 9/2 Blood culture NGTD.   Urinary retention with bilateral hydronephrosis Foley catheter inserted, developed gross hematuria could be secondary to distended urinary bladder and traumatic Foley catheter  insertion. Started Flomax Held Eliquis, hematuria resolved, urologist cleared to start Eliquis on 9/1  Urology following, recommended to keep Foley catheter and follow with an outpatient for voiding trial F/u with Urology on 09/26 at 9:15 with Dr. Lonna Cobb and 3:00 pm with PA.  Septic Encephalopathy- Change in mental status likely due to underlying sepsis. Patient's confusion did improve.  He is back to baseline. Patient is weak, and he will need nursing facility placement.  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 Baseline Creatinine around 1.5. Presented with urinary retention and bilateral hydronephrosis Continue foley care, Monitor renal function and urine output Avoid nephrotoxic medication, use renally dose medications Nephrology on board, creatinine 7.18--3.14 gradually improving, no urgent HD need.   Paroxysmal A-fib, HTN, sick sinus syndrome. Held Eliquis due to hematuria, resumed on 9/1, hematuria resolved. Held hydrochlorothiazide due to AKI Monitor BP and titrate medication accordingly, resume BP meds if needed. Continue to monitor on telemetry   Hematochezia noticed at night on 9/1 Continue pantoprazole 40 mg IV BID x 3 days, followed by oral twice daily Monitor H&H GI consulted recommended conservative management and continue Eliquis.   Fall at home most likely secondary to renal failure Continue fall precautions PT and OT recommended SNF. TOC working on placement   Diet: Renal diet DVT Prophylaxis: SCD, pharmacological prophylaxis contraindicated due to bleeding     Advance goals of care discussion: Full code     Subjective: Patient is seen and examined today morning, he denies any complaints, poor historian. Eating fair. Energy poor.  Physical Exam: Vitals:   12/01/22 0734 12/01/22 1207 12/01/22 1313 12/01/22 1524  BP: 130/80 Marland Kitchen)  132/94 (!) 143/85 132/81  Pulse: 66 63 65 74  Resp: 18 18 18 18   Temp: 97.8 F  (36.6 C) 98 F (36.7 C) 98.2 F (36.8 C) 98.2 F (36.8 C)  TempSrc: Oral Oral Oral Oral  SpO2: 95% 100% 99% 98%  Weight:      Height:       General -Elderly Caucasian male, lying in no apparent distress HEENT - PERRLA, EOMI, atraumatic head, non tender sinuses. Lung -CTA, bibasilar Rales Heart - S1, S2 heard, no murmurs, rubs, 1+ pedal edema Neuro - Alert, awake and oriented, non focal exam. Skin - Warm and dry.  Data Reviewed:     Latest Ref Rng & Units 12/01/2022    5:19 AM 11/30/2022    6:05 AM 11/29/2022    2:15 AM  CBC  WBC 4.0 - 10.5 K/uL 8.6  8.3  7.8   Hemoglobin 13.0 - 17.0 g/dL 40.9  81.1  91.4   Hematocrit 39.0 - 52.0 % 32.0  35.2  31.8   Platelets 150 - 400 K/uL 245  278  240        Latest Ref Rng & Units 12/01/2022    5:19 AM 11/30/2022    6:05 AM 11/29/2022    2:15 AM  BMP  Glucose 70 - 99 mg/dL 782  97  956   BUN 8 - 23 mg/dL 50  64  83   Creatinine 0.61 - 1.24 mg/dL 2.13  0.86  5.78   Sodium 135 - 145 mmol/L 141  145  145   Potassium 3.5 - 5.1 mmol/L 2.8  3.4  3.3   Chloride 98 - 111 mmol/L 104  105  108   CO2 22 - 32 mmol/L 28  27  25    Calcium 8.9 - 10.3 mg/dL 8.3  8.8  8.3    No results found.   Family Communication: TOC contacted wife for SNF placement.  Disposition: Status is: Inpatient Remains inpatient appropriate because: SNF placement once kidney function improves.  Planned Discharge Destination: Skilled nursing facility    Time spent: 42 minutes  Author: Marcelino Duster, MD 12/01/2022 9:07 PM  For on call review [email or web address].

## 2022-12-01 NOTE — Progress Notes (Signed)
[facility] [place], [place] 12/01/22  Subjective:   Hospital day # 6  Patient was recently admitted for a syncopal event and discharged from hospital on 10/21/2022.  During previous admission he was noted to have AKI of unclear cause.  His renal ultrasound showed mild bilateral hydronephrosis without obstruction.  Creatinine was 3.02 at the time of discharge and improving.   Renal: 09/04 0701 - 09/05 0700 In: 240 [P.O.:240] Out: 2250 [Urine:2250] Lab Results  Component Value Date   CREATININE 3.14 (H) 12/01/2022   CREATININE 3.90 (H) 11/30/2022   CREATININE 4.76 (H) 11/29/2022   Sitting up in bed Denies pain or discomfort Tolerating small meals   UOP  Objective:  Vital signs in last 24 hours:  Temp:  [97.8 F (36.6 C)-98.2 F (36.8 C)] 98.2 F (36.8 C) (09/05 1313) Pulse Rate:  [53-74] 65 (09/05 1313) Resp:  [16-18] 18 (09/05 1313) BP: (121-150)/(74-94) 143/85 (09/05 1313) SpO2:  [95 %-100 %] 99 % (09/05 1313) Weight:  [87.8 kg] 87.8 kg (09/05 0405)  Weight change: 2.3 kg Filed Weights   11/29/22 0600 11/30/22 0400 12/01/22 0405  Weight: 88.7 kg 85.5 kg 87.8 kg    Intake/Output:    Intake/Output Summary (Last 24 hours) at 12/01/2022 1423 Last data filed at 12/01/2022 1207 Gross per 24 hour  Intake 240 ml  Output 2175 ml  Net -1935 ml     Physical Exam: General: NAD, laying in the bed  HEENT Moist oral mucous membranes  Pulm/lungs Normal breathing effort, coarse breath sounds  CVS/Heart Irregular rhythm  Abdomen:  Soft, nontender  Extremities: Trace to 1+ edema  Neurologic: Able to answer simple questions and follow commands  Skin: No acute rashes          Basic Metabolic Panel:  Recent Labs  Lab 11/25/22 1546 11/26/22 0910 11/27/22 0557 11/28/22 0501 11/29/22 0215 11/30/22 0605 12/01/22 0519  NA 132* 136 135 142 145 145 141  K 4.6 4.7 4.0 3.3* 3.3* 3.4* 2.8*  CL 99 103 104 105 108 105 104  CO2 20* 20* 20* 25 25  27 28   GLUCOSE 138* 118* 94 106* 106* 97 103*  BUN 108* 116* 107* 98* 83* 64* 50*  CREATININE 8.08* 8.01* 7.18* 6.09* 4.76* 3.90* 3.14*  CALCIUM 9.2 8.5* 8.4* 8.1* 8.3* 8.8* 8.3*  MG 2.4 1.8 2.4 2.2 2.0  --   --   PHOS  --  5.6* 5.6* 4.5 3.6  --   --      CBC: Recent Labs  Lab 11/25/22 1546 11/26/22 0500 11/27/22 0557 11/28/22 0115 11/28/22 0501 11/29/22 0215 11/30/22 0605 12/01/22 0519  WBC 12.8*   < > 9.3  --  7.4 7.8 8.3 8.6  NEUTROABS 10.3*  --   --   --   --   --   --   --   HGB 12.4*   < > 11.8* 11.5* 10.7* 10.5* 11.7* 10.6*  HCT 37.0*   < > 35.3* 34.3* 30.8* 31.8* 35.2* 32.0*  MCV 92.7   < > 92.4  --  89.5 93.0 92.9 91.7  PLT 275   < > 216  --  233 240 278 245   < > = values in this interval not displayed.     No results found for: "HEPBSAG", "HEPBSAB", "HEPBIGM"    Microbiology:  Recent Results (from the past 240 hour(s))  Urine Culture     Status: Abnormal   Collection Time: 11/25/22  3:46 PM  Specimen: Urine, Catheterized  Result Value Ref Range Status   Specimen Description   Final    URINE, CATHETERIZED Performed at [facility], [address] [place], [place] [address]    Special Requests   Final    NONE Performed at [facility], [address] [place], [place] [address]    Culture >=100,000 COLONIES/mL KLEBSIELLA OXYTOCA (A)  Final   Report Status 11/28/2022 FINAL  Final   Organism ID, Bacteria KLEBSIELLA OXYTOCA (A)  Final      Susceptibility   Klebsiella oxytoca - MIC*    AMPICILLIN >=32 RESISTANT Resistant     CEFEPIME <=0.12 SENSITIVE Sensitive     CEFTRIAXONE <=0.25 SENSITIVE Sensitive     CIPROFLOXACIN <=0.25 SENSITIVE Sensitive     GENTAMICIN <=1 SENSITIVE Sensitive     IMIPENEM <=0.25 SENSITIVE Sensitive     NITROFURANTOIN 32 SENSITIVE Sensitive     TRIMETH/SULFA <=20 SENSITIVE Sensitive     AMPICILLIN/SULBACTAM 8 SENSITIVE Sensitive     PIP/TAZO <=4 SENSITIVE Sensitive     * >=100,000 COLONIES/mL  KLEBSIELLA OXYTOCA  Culture, blood (Routine X 2) w Reflex to ID Panel     Status: None   Collection Time: 11/25/22  9:48 PM   Specimen: BLOOD  Result Value Ref Range Status   Specimen Description BLOOD RIGHT ANTECUBITAL  Final   Special Requests   Final    BOTTLES DRAWN AEROBIC AND ANAEROBIC Blood Culture adequate volume   Culture   Final    NO GROWTH 5 DAYS Performed at [facility], [address] [place], [place] [address]    Report Status 12/01/2022 FINAL  Final  Culture, blood (Routine X 2) w Reflex to ID Panel     Status: None   Collection Time: 11/25/22  9:48 PM   Specimen: BLOOD  Result Value Ref Range Status   Specimen Description BLOOD BLOOD RIGHT FOREARM  Final   Special Requests   Final    BOTTLES DRAWN AEROBIC AND ANAEROBIC Blood Culture adequate volume   Culture   Final    NO GROWTH 5 DAYS Performed at [facility], [address] [place], [place] [address]    Report Status 12/01/2022 FINAL  Final  C Difficile Quick Screen w PCR reflex     Status: None   Collection Time: 11/26/22  4:00 PM   Specimen: STOOL  Result Value Ref Range Status   C Diff antigen NEGATIVE NEGATIVE Final   C Diff toxin NEGATIVE NEGATIVE Final   C Diff interpretation No C. difficile detected.  Final    Comment: Performed at [facility], [address] [place], [place] [address]  Gastrointestinal Panel by PCR , Stool     Status: None   Collection Time: 11/26/22  4:00 PM   Specimen: Stool  Result Value Ref Range Status   Campylobacter species NOT DETECTED NOT DETECTED Final   Plesimonas shigelloides NOT DETECTED NOT DETECTED Final   Salmonella species NOT DETECTED NOT DETECTED Final   Yersinia enterocolitica NOT DETECTED NOT DETECTED Final   Vibrio species NOT DETECTED NOT DETECTED Final   Vibrio cholerae NOT DETECTED NOT DETECTED Final   Enteroaggregative E coli (EAEC) NOT DETECTED NOT DETECTED Final   Enteropathogenic E coli (EPEC) NOT DETECTED NOT  DETECTED Final   Enterotoxigenic E coli (ETEC) NOT DETECTED NOT DETECTED Final   Shiga like toxin producing E coli (STEC) NOT DETECTED NOT DETECTED Final   Shigella/Enteroinvasive E coli (EIEC) NOT DETECTED NOT DETECTED  Final   Cryptosporidium NOT DETECTED NOT DETECTED Final   Cyclospora cayetanensis NOT DETECTED NOT DETECTED Final   Entamoeba histolytica NOT DETECTED NOT DETECTED Final   Giardia lamblia NOT DETECTED NOT DETECTED Final   Adenovirus F40/41 NOT DETECTED NOT DETECTED Final   Astrovirus NOT DETECTED NOT DETECTED Final   Norovirus GI/GII NOT DETECTED NOT DETECTED Final   Rotavirus A NOT DETECTED NOT DETECTED Final   Sapovirus (I, II, IV, and V) NOT DETECTED NOT DETECTED Final    Comment: Performed at [facility], [address] [place], [place] [address]    Coagulation Studies: No results for input(s): "LABPROT", "INR" in the last 72 hours.  Urinalysis: No results for input(s): "COLORURINE", "LABSPEC", "PHURINE", "GLUCOSEU", "HGBUR", "BILIRUBINUR", "KETONESUR", "PROTEINUR", "UROBILINOGEN", "NITRITE", "LEUKOCYTESUR" in the last 72 hours.  Invalid input(s): "APPERANCEUR"     Imaging: No results found.   Medications:    cefTRIAXone (ROCEPHIN)  IV 1 g (11/30/22 1838)    apixaban  2.5 mg Oral BID   Chlorhexidine Gluconate Cloth  6 each Topical Daily   feeding supplement  237 mL Oral TID BM   pantoprazole  40 mg Oral BID   potassium chloride  40 mEq Oral BID   sodium chloride flush  3 mL Intravenous Q12H   tamsulosin  0.4 mg Oral Daily   acetaminophen **OR** acetaminophen, morphine injection  Assessment/ Plan:  82 y.o. male with history of atrial fibrillation, recent reverse arthroplasty of left shoulder 09/22/2022 by Dr Joice Lofts, hypertension admitted on 11/25/2022 for Urinary retention [R33.9] Fall [W19.XXXA] Acute cystitis without hematuria [N30.00] AKI (acute kidney injury) (HCC) [N17.9] Fall, initial encounter [W19.XXXA]   #Acute kidney injury  secondary to acute urinary retention and bilateral hydronephrosis.   Patient's baseline creatinine is 1.17 from 09/23/2022 Patient has been evaluated by urologist.  His most recent CT scan from 11/25/2022 showed moderate right and mild left hydronephrosis with no obvious obstructing stone.  Renal function improving with adequate urine output. Continue to encourage oral intake. Avoid nephrotoxic agents and hypotension. No acute need for dialysis, will continue to monitor.  #Gross hematuria Secondary to Eliquis and bladder distention injury. Foley catheter remains in place  #Acute metabolic acidosis Sodium bicarbonate corrected   #Hypokalemia Ordered oral potassium supplement.    LOS: 6 [employer] 9/5/20242:23 PM  [facility] [address] [place], [place] [phone]

## 2022-12-01 NOTE — TOC Progression Note (Signed)
Transition of Care [facility]) - Progression Note    Patient Details  Name: Steven Welch MRN: [id number] Date of Birth: 20-Nov-1940  Transition of Care [facility]) CM/SW Contact  Truddie Hidden, RN Phone Number: 12/01/2022, 10:16 AM  Clinical Narrative:    Vesta Mixer started.        Expected Discharge Plan and Services                                               Social Determinants of Health (SDOH) Interventions SDOH Screenings   Food Insecurity: No Food Insecurity (10/14/2022)  Housing: Low Risk  (10/14/2022)  Transportation Needs: No Transportation Needs (10/14/2022)  Utilities: Not At Risk (10/14/2022)  Financial Resource Strain: Patient Declined (02/22/2022)   Received from [facility], [facility]  Tobacco Use: Low Risk  (11/25/2022)    Readmission Risk Interventions     No data to display

## 2022-12-01 NOTE — Progress Notes (Signed)
Pt refused mobility this afternoon.

## 2022-12-02 DIAGNOSIS — W19XXXA Unspecified fall, initial encounter: Secondary | ICD-10-CM | POA: Diagnosis not present

## 2022-12-02 DIAGNOSIS — I480 Paroxysmal atrial fibrillation: Secondary | ICD-10-CM | POA: Diagnosis not present

## 2022-12-02 DIAGNOSIS — K219 Gastro-esophageal reflux disease without esophagitis: Secondary | ICD-10-CM | POA: Diagnosis not present

## 2022-12-02 DIAGNOSIS — N179 Acute kidney failure, unspecified: Secondary | ICD-10-CM | POA: Diagnosis not present

## 2022-12-02 LAB — CBC
HCT: 35.1 % — ABNORMAL LOW (ref 39.0–52.0)
Hemoglobin: 11.7 g/dL — ABNORMAL LOW (ref 13.0–17.0)
MCH: 31.1 pg (ref 26.0–34.0)
MCHC: 33.3 g/dL (ref 30.0–36.0)
MCV: 93.4 fL (ref 80.0–100.0)
Platelets: 258 10*3/uL (ref 150–400)
RBC: 3.76 MIL/uL — ABNORMAL LOW (ref 4.22–5.81)
RDW: 13.8 % (ref 11.5–15.5)
WBC: 12 10*3/uL — ABNORMAL HIGH (ref 4.0–10.5)
nRBC: 0 % (ref 0.0–0.2)

## 2022-12-02 LAB — BASIC METABOLIC PANEL
Anion gap: 11 (ref 5–15)
BUN: 42 mg/dL — ABNORMAL HIGH (ref 8–23)
CO2: 24 mmol/L (ref 22–32)
Calcium: 8.6 mg/dL — ABNORMAL LOW (ref 8.9–10.3)
Chloride: 104 mmol/L (ref 98–111)
Creatinine, Ser: 2.59 mg/dL — ABNORMAL HIGH (ref 0.61–1.24)
GFR, Estimated: 24 mL/min — ABNORMAL LOW (ref >60)
Glucose, Bld: 98 mg/dL (ref 70–99)
Potassium: 3.4 mmol/L — ABNORMAL LOW (ref 3.5–5.1)
Sodium: 139 mmol/L (ref 135–145)

## 2022-12-02 NOTE — Plan of Care (Signed)
  Problem: Pain Managment: Goal: General experience of comfort will improve Outcome: Progressing   Problem: Safety: Goal: Ability to remain free from injury will improve Outcome: Progressing   Problem: Skin Integrity: Goal: Risk for impaired skin integrity will decrease Outcome: Progressing   Problem: Activity: Goal: Risk for activity intolerance will decrease Outcome: Progressing   Problem: Nutrition: Goal: Adequate nutrition will be maintained Outcome: Progressing   Problem: Elimination: Goal: Will not experience complications related to bowel motility Outcome: Progressing

## 2022-12-02 NOTE — Care Management Important Message (Signed)
Important Message  Patient Details  Name: Steven Welch MRN: [id number] Date of Birth: 1940/09/27   Medicare Important Message Given:  Yes     [name] [name] 12/02/2022, 12:00 PM

## 2022-12-02 NOTE — Progress Notes (Addendum)
Physical Therapy Treatment Patient Details Name: Leanord Deitrich MRN: [id number] DOB: [birth year] Today's Date: 12/02/2022   History of Present Illness 82 y.o. male with PMH of paroxysmal A-fib on Eliquis, sick sinus syndrome, hypertension, frequent falls, status post left shoulder surgery, seen by cardiology on 6/24 as reviewed from EMR, presented at [facility] ED with fall at home from standing position and striking his head while trying to get in the recliner.  Patient's wife reported to ED physician that patient is having worsening of weakness and confusion over the past week    PT Comments  Pt presents sitting in recliner, no complaints of pain. Pt willing to participate in TherEx prior to mobility with encouragement. He performed sit<>stand modAx2 for completion and step pivot to EOB with minAx2. Pt exhibited prominent posterior trunk lean during standing mobility requiring therapist assist to maintain upright. Pt sitting balance fluctuated between supervision-maxA due impulsive posterior trunk lean when going to lay in bed (needing maxA from therapist to maintain safety/avoid hitting head). He performed sit>supine with modA for trunk control and VC for safe mobility. Pt exhibits decreased safety awareness throughout session requiring therapist intervention. He would benefit from continued skilled care to maximize functional abilties.    If plan is discharge home, recommend the following: A lot of help with walking and/or transfers;Assistance with cooking/housework;Direct supervision/assist for medications management;Help with stairs or ramp for entrance;Assist for transportation;A lot of help with bathing/dressing/bathroom   Can travel by private vehicle     No  Equipment Recommendations  Other (comment) (TBD at next venue)    Recommendations for Other Services       Precautions / Restrictions Precautions Precautions: Fall Restrictions Weight Bearing Restrictions: No Other Position/Activity  Restrictions: S/P L RSA (6/27) - Per MD note "exercises will include active and passive range of motion exercises as well as progressive resistive strengthening exercises. He may wean out of the sling at this time and progress with his normal daily activities as symptoms permit. However, he is to avoid offending activities"     Mobility  Bed Mobility   Bed Mobility: Sit to Supine       Sit to supine: Mod assist   General bed mobility comments: modA for trunk control    Transfers Overall transfer level: Needs assistance Equipment used: Rolling walker (2 wheels) Transfers: Sit to/from Stand, Bed to chair/wheelchair/BSC Sit to Stand: Mod assist, +2 physical assistance, +2 safety/equipment   Step pivot transfers: Min assist, +2 physical assistance, +2 safety/equipment       General transfer comment: continued VC for forward lean/hand placement, prominent posterior lean during transfer    Ambulation/Gait                   Stairs             Wheelchair Mobility     Tilt Bed    Modified Rankin (Stroke Patients Only)       Balance Overall balance assessment: Needs assistance Sitting-balance support: Bilateral upper extremity supported, Feet supported Sitting balance-Leahy Scale: Poor Sitting balance - Comments: fluctuates between supervision-maxA, would impulsively lean trunk backward when going to lay in bed needing maxA from therapist to maintain safety Postural control: Posterior lean Standing balance support: Bilateral upper extremity supported, During functional activity, Reliant on assistive device for balance Standing balance-Leahy Scale: Poor Standing balance comment: posterior lean limiting upright balance  Cognition Arousal: Alert Behavior During Therapy: Flat affect, Impulsive Overall Cognitive Status: No family/caregiver present to determine baseline cognitive functioning Area of Impairment:  Safety/judgement                         Safety/Judgement: Decreased awareness of safety              Exercises General Exercises - Lower Extremity Ankle Circles/Pumps: AROM, Both, 10 reps, Seated Long Arc Quad: Strengthening, Both, 20 reps, Seated Hip Flexion/Marching: AROM, Both, 10 reps, Seated    General Comments General comments (skin integrity, edema, etc.): some dizziness in standing which resolved quickly      Pertinent Vitals/Pain Pain Assessment Pain Assessment: No/denies pain    Home Living                          Prior Function            PT Goals (current goals can now be found in the care plan section) Progress towards PT goals: Progressing toward goals    Frequency    Min 1X/week      PT Plan      Co-evaluation              AM-PAC PT "6 Clicks" Mobility   Outcome Measure  Help needed turning from your back to your side while in a flat bed without using bedrails?: A Lot Help needed moving from lying on your back to sitting on the side of a flat bed without using bedrails?: A Lot Help needed moving to and from a bed to a chair (including a wheelchair)?: A Lot Help needed standing up from a chair using your arms (e.g., wheelchair or bedside chair)?: A Lot Help needed to walk in hospital room?: A Lot Help needed climbing 3-5 steps with a railing? : Total 6 Click Score: 11    End of Session Equipment Utilized During Treatment: Gait belt Activity Tolerance: Patient tolerated treatment well Patient left: in bed;with call bell/phone within reach;with bed alarm set   PT Visit Diagnosis: Muscle weakness (generalized) (M62.81);Difficulty in walking, not elsewhere classified (R26.2);History of falling (Z91.81)     Time: 1610-9604 PT Time Calculation (min) (ACUTE ONLY): 14 min  Charges:    $Therapeutic Activity: 8-22 mins PT General Charges $$ ACUTE PT VISIT: 1 Visit                    Chele Cornell, PT,  SPT 12:28 PM,12/02/22

## 2022-12-02 NOTE — Plan of Care (Signed)
  Problem: Clinical Measurements: Goal: Ability to maintain clinical measurements within normal limits will improve Outcome: Progressing Goal: Diagnostic test results will improve Outcome: Progressing Goal: Cardiovascular complication will be avoided Outcome: Progressing   Problem: Activity: Goal: Risk for activity intolerance will decrease Outcome: Progressing   Problem: Nutrition: Goal: Adequate nutrition will be maintained Outcome: Progressing   Problem: Coping: Goal: Level of anxiety will decrease Outcome: Progressing   Problem: Pain Managment: Goal: General experience of comfort will improve Outcome: Progressing

## 2022-12-02 NOTE — TOC Progression Note (Signed)
Transition of Care [facility]) - Progression Note    Patient Details  Name: Steven Welch MRN: [id number] Date of Birth: 21-May-1940  Transition of Care [facility]) CM/SW Contact  Garret Reddish, RN Phone Number: 12/02/2022, 2:31 PM  Clinical Narrative:   Chart reviewed.  SNF approved.   Plan Auth ID -[id number], Auth ID- [id number].  Approved from 12-02-22-12-06-22.  Next review date will be 12-06-2022.  I have informed Dr. Clide Dales of the above information.  Patient is not stable for discharge at this time.  Patient with dark stools today and kidney function is not at baseline.    I have been informed by Kenney Houseman,  Admission Coordinator at [facility] that patient has 67 Medicare days left.  She will be able to accept patient when stable for discharge. Kenney Houseman reports that they are able to accept over the weekend.  I have informed Dr. Clide Dales of the above information.     TOC will continue to follow for discharge planning.        Expected Discharge Plan and Services                                               Social Determinants of Health (SDOH) Interventions SDOH Screenings   Food Insecurity: No Food Insecurity (10/14/2022)  Housing: Low Risk  (10/14/2022)  Transportation Needs: No Transportation Needs (10/14/2022)  Utilities: Not At Risk (10/14/2022)  Financial Resource Strain: Patient Declined (02/22/2022)   Received from [facility], [facility]  Tobacco Use: Low Risk  (11/25/2022)    Readmission Risk Interventions     No data to display

## 2022-12-02 NOTE — Progress Notes (Signed)
OT Cancellation Note  Patient Details Name: Steven Welch MRN: [id number] DOB: [birth year]   Cancelled Treatment:    Reason Eval/Treat Not Completed: Fatigue/lethargy limiting ability to participate. Pt sleeping soundly upon attempt, does not rouse to VC/TC. Will re-attempt OT tx at later date/time as pt is able to participate.   Arman Filter., MPH, MS, OTR/L ascom [phone] 12/02/22, 3:55 PM

## 2022-12-02 NOTE — Progress Notes (Addendum)
Progress Note   Patient: Steven Welch [id number] DOB: [birth year] DOA: 11/25/2022     7 DOS: the patient was seen and examined on 12/02/2022   Brief hospital course: Steven Welch is a 82 y.o. male with PMH of paroxysmal A-fib on Eliquis, sick sinus syndrome, hypertension, frequent falls, status post left shoulder surgery, seen by cardiology on 6/24 as reviewed from EMR, presented at [facility] ED with fall at home from standing position and striking his head while trying to get in the recliner.  Patient's wife reported to ED physician that patient is having worsening of weakness and confusion over the past week since discharge.  Patient is AO x 1, following command.  Unable to offer any complaints. ED workup: VS heart rate 57, BP stable, saturating well on room air Sodium 132, CO2 20, BUN 108 creatinine 8.0 Lactic acid 1.3 within normal range, WBC 12.9 elevated UA positive EKG sinus bradycardia with right BBB, inferior T wave inversion. CT head negative for any acute findings CT C-spine negative for any acute trauma CT A/P: 1. Moderate right and mild left hydroureteronephrosis. No urinary calculi. 2. Foley catheter within the bladder. The bladder is not well evaluated due to streak artifact. There may be some bladder wall thickening and perivesical stranding. Locules of gas either within the bladder lumen or wall. Correlate for cystitis. 3. Small right pleural effusion with pleural thickening similar to CT chest 04/09/2021. US renal: Mild bilateral hydronephrosis. Layering debris within the urinary bladder. Urine culture and blood culture collected   Assessment and Plan: Sepsis due to UTI UA positive, urine culture growing Klebsiella oxytoca S/p Zosyn, transition to ceftriaxone on 9/2 finished course. Blood culture NGTD.   Urinary retention with bilateral hydronephrosis Foley catheter inserted, developed gross hematuria could be secondary to distended urinary bladder and traumatic Foley catheter  insertion. Started Flomax Held Eliquis, hematuria resolved, urologist cleared to start Eliquis on 9/1  Urology following, recommended to keep Foley catheter and follow with an outpatient for voiding trial. F/u with Urology on 09/26 at 9:15 with Dr. Lonna Cobb and 3:00 pm with PA.  Septic Encephalopathy- Change in mental status likely due to underlying sepsis. Patient's confusion did improve.  He is back to baseline.  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 Baseline Creatinine around 1.5. Presented with urinary retention and bilateral hydronephrosis Continue foley care, monitor renal function and urine output Avoid nephrotoxic medication, use renally dose medications Nephrology on board, creatinine 8.08--2.5 gradually improving, no urgent HD need.   Hypokalemia-  Improved. Continue to replete and recheck as needed.  Paroxysmal A-fib, HTN, sick sinus syndrome. Held Eliquis due to hematuria, resumed on 9/1, hematuria resolved. Held hydrochlorothiazide due to AKI Monitor BP and titrate medication accordingly, resume BP meds if needed. Continue to monitor on telemetry   Hematochezia noticed at night on 9/1, on and off 9/6 per RN. Hb stable, will repeat CBC today. Transfuse if Hb less than 8. Continue pantoprazole 40 mg oral twice daily. GI recommended conservative management and continue Eliquis.   Fall at home  Recurrent falls. Continue fall precautions. PT and OT recommended SNF. TOC working on placement.  Diet: Renal diet DVT Prophylaxis: SCD, pharmacological prophylaxis contraindicated due to bleeding     Advance goals of care discussion: Full code     Subjective: Patient is seen and examined today morning, he denies any complaints, poor historian. Sitting in chair. RN noted dark colored loose stool. Energy poor. CBC ordered.  Physical Exam: Vitals:  12/01/22 1524 12/01/22 2344 12/02/22 0500 12/02/22 0723  BP: 132/81 131/73   131/83  Pulse: 74 71  69  Resp: 18 18  16   Temp: 98.2 F (36.8 C) 98.8 F (37.1 C)  98.1 F (36.7 C)  TempSrc: Oral     SpO2: 98% 97%  94%  Weight:   87.7 kg   Height:       General -Elderly Caucasian male, sitting in no apparent distress HEENT - PERRLA, EOMI, atraumatic head, non tender sinuses. Lung -CTA, bibasilar Rales Heart - S1, S2 heard, no murmurs, rubs, 1+ pedal edema Neuro - Alert, awake and oriented, non focal exam. Skin - Warm and dry.  Data Reviewed:     Latest Ref Rng & Units 12/01/2022    5:19 AM 11/30/2022    6:05 AM 11/29/2022    2:15 AM  CBC  WBC 4.0 - 10.5 K/uL 8.6  8.3  7.8   Hemoglobin 13.0 - 17.0 g/dL 78.2  95.6  21.3   Hematocrit 39.0 - 52.0 % 32.0  35.2  31.8   Platelets 150 - 400 K/uL 245  278  240        Latest Ref Rng & Units 12/02/2022    9:36 AM 12/01/2022    5:19 AM 11/30/2022    6:05 AM  BMP  Glucose 70 - 99 mg/dL 98  086  97   BUN 8 - 23 mg/dL 42  50  64   Creatinine 0.61 - 1.24 mg/dL 5.78  4.69  6.29   Sodium 135 - 145 mmol/L 139  141  145   Potassium 3.5 - 5.1 mmol/L 3.4  2.8  3.4   Chloride 98 - 111 mmol/L 104  104  105   CO2 22 - 32 mmol/L 24  28  27    Calcium 8.9 - 10.3 mg/dL 8.6  8.3  8.8    No results found.   Family Communication: Contacted wife over phone and updated regarding his care plan.  Disposition: Status is: Inpatient Remains inpatient appropriate because: SNF once kidney function improves.  Planned Discharge Destination: Skilled nursing facility    Time spent: 40 minutes  Author: Marcelino Duster, MD 12/02/2022 2:27 PM  For on call review [email or web address].

## 2022-12-03 DIAGNOSIS — K625 Hemorrhage of anus and rectum: Secondary | ICD-10-CM

## 2022-12-03 DIAGNOSIS — K219 Gastro-esophageal reflux disease without esophagitis: Secondary | ICD-10-CM | POA: Diagnosis not present

## 2022-12-03 DIAGNOSIS — I480 Paroxysmal atrial fibrillation: Secondary | ICD-10-CM | POA: Diagnosis not present

## 2022-12-03 DIAGNOSIS — N179 Acute kidney failure, unspecified: Secondary | ICD-10-CM | POA: Diagnosis not present

## 2022-12-03 DIAGNOSIS — E876 Hypokalemia: Secondary | ICD-10-CM

## 2022-12-03 DIAGNOSIS — R310 Gross hematuria: Secondary | ICD-10-CM | POA: Diagnosis not present

## 2022-12-03 LAB — BASIC METABOLIC PANEL
Anion gap: 11 (ref 5–15)
BUN: 35 mg/dL — ABNORMAL HIGH (ref 8–23)
CO2: 24 mmol/L (ref 22–32)
Calcium: 8.5 mg/dL — ABNORMAL LOW (ref 8.9–10.3)
Chloride: 107 mmol/L (ref 98–111)
Creatinine, Ser: 2.31 mg/dL — ABNORMAL HIGH (ref 0.61–1.24)
GFR, Estimated: 28 mL/min — ABNORMAL LOW (ref >60)
Glucose, Bld: 109 mg/dL — ABNORMAL HIGH (ref 70–99)
Potassium: 3.6 mmol/L (ref 3.5–5.1)
Sodium: 142 mmol/L (ref 135–145)

## 2022-12-03 LAB — CBC
HCT: 35 % — ABNORMAL LOW (ref 39.0–52.0)
Hemoglobin: 11.4 g/dL — ABNORMAL LOW (ref 13.0–17.0)
MCH: 30.6 pg (ref 26.0–34.0)
MCHC: 32.6 g/dL (ref 30.0–36.0)
MCV: 93.8 fL (ref 80.0–100.0)
Platelets: 273 10*3/uL (ref 150–400)
RBC: 3.73 MIL/uL — ABNORMAL LOW (ref 4.22–5.81)
RDW: 14 % (ref 11.5–15.5)
WBC: 11.5 10*3/uL — ABNORMAL HIGH (ref 4.0–10.5)
nRBC: 0 % (ref 0.0–0.2)

## 2022-12-03 MED ORDER — METOPROLOL SUCCINATE ER 25 MG PO TB24: 12.5000 mg | ORAL_TABLET | Freq: Every day | ORAL | 2 refills | Status: DC

## 2022-12-03 MED ORDER — APIXABAN 2.5 MG PO TABS: 2.5000 mg | ORAL_TABLET | Freq: Two times a day (BID) | ORAL | 2 refills | Status: DC

## 2022-12-03 MED ORDER — NYSTATIN 100000 UNIT/GM EX POWD
Freq: Three times a day (TID) | CUTANEOUS | Status: DC
  Filled 2022-12-03: qty 15

## 2022-12-03 MED ORDER — PANTOPRAZOLE SODIUM 40 MG PO TBEC: 40.0000 mg | DELAYED_RELEASE_TABLET | Freq: Two times a day (BID) | ORAL | 1 refills | Status: DC

## 2022-12-03 MED ORDER — TAMSULOSIN HCL 0.4 MG PO CAPS: 0.4000 mg | ORAL_CAPSULE | Freq: Every day | ORAL | 3 refills | Status: DC

## 2022-12-03 MED ORDER — NYSTATIN 100000 UNIT/GM EX POWD: Freq: Three times a day (TID) | CUTANEOUS | 0 refills | Status: DC

## 2022-12-03 MED ORDER — ENSURE ENLIVE PO LIQD: 237.0000 mL | Freq: Three times a day (TID) | ORAL | 12 refills | Status: DC

## 2022-12-03 NOTE — Plan of Care (Signed)
  Problem: Health Behavior/Discharge Planning: Goal: Ability to manage health-related needs will improve Outcome: Progressing   Problem: Clinical Measurements: Goal: Diagnostic test results will improve Outcome: Progressing Goal: Cardiovascular complication will be avoided Outcome: Progressing   Problem: Activity: Goal: Risk for activity intolerance will decrease Outcome: Progressing   Problem: Nutrition: Goal: Adequate nutrition will be maintained Outcome: Progressing   Problem: Coping: Goal: Level of anxiety will decrease Outcome: Progressing   Problem: Elimination: Goal: Will not experience complications related to bowel motility Outcome: Progressing   Problem: Pain Managment: Goal: General experience of comfort will improve Outcome: Progressing

## 2022-12-03 NOTE — TOC Transition Note (Addendum)
Transition of Care [facility]) - CM/SW Discharge Note   Patient Details  Name: Steven Welch MRN: [id number] Date of Birth: 09-20-1940  [facility] CM/SW Contact:  Kemper Durie, RN Phone Number: 12/03/2022, 2:22 PM   Clinical Narrative:     Patient ready for discharge, confirmed with Kenney Houseman that patient able to go to [facility] today, will go to room 41.  Discharge summary sent to facility, bedside nurse aware to call report.  Spoke with wife Ki, advised of discharge plan.  Medical Necessity form and facesheet completed and printed.     Update @ 1452:  EMS called for transport, patient is 4th on the list, bedside nurse aware.  Discharge summary faxed to facility.   Final next level of care: Skilled Nursing Facility Barriers to Discharge: Barriers Resolved   Patient Goals and CMS Choice      Discharge Placement                  Patient to be transferred to facility by: [facility] Name of family member notified: Ki Ocampo Patient and family notified of of transfer: 12/03/22  Discharge Plan and Services Additional resources added to the After Visit Summary for                                       Social Determinants of Health (SDOH) Interventions SDOH Screenings   Food Insecurity: No Food Insecurity (10/14/2022)  Housing: Low Risk  (10/14/2022)  Transportation Needs: No Transportation Needs (10/14/2022)  Utilities: Not At Risk (10/14/2022)  Financial Resource Strain: Patient Declined (02/22/2022)   Received from [facility], [facility]  Tobacco Use: Low Risk  (11/25/2022)     Readmission Risk Interventions     No data to display

## 2022-12-03 NOTE — Plan of Care (Signed)

## 2022-12-03 NOTE — Discharge Summary (Signed)
Physician Discharge Summary   Patient: Steven Welch MRN: [id number] DOB: 04/14/1940  Admit date:     11/25/2022  Discharge date: 12/03/22  Discharge Physician: Marcelino Duster   PCP: Dorothey Baseman, MD   Recommendations at discharge:    PCP follow up in 1 week. Urology follow up on 09/26 at 9:15 with Dr. Lonna Cobb and 3:00 pm   Discharge Diagnoses: Principal Problem:   Fall Active Problems:   PAF (paroxysmal atrial fibrillation) (HCC)   AKI (acute kidney injury) (HCC)   Sepsis (HCC)   Hypertension   Hypokalemia   Rectal bleeding   Gastro-esophageal reflux disease without esophagitis   Gross hematuria   Sepsis secondary to UTI [facility])  Resolved Problems:   * No resolved hospital problems. *  Hospital Course: Steven Welch is a 82 y.o. male with PMH of paroxysmal A-fib on Eliquis, sick sinus syndrome, hypertension, frequent falls, status post left shoulder surgery, seen by cardiology on 6/24 as reviewed from EMR, presented at [facility] ED with fall at home from standing position and striking his head while trying to get in the recliner.  Patient's wife reported to ED physician that patient is having worsening of weakness and confusion over the past week since discharge.  Patient is AO x 1, following command.  Unable to offer any complaints. ED workup: VS heart rate 57, BP stable, saturating well on room air Sodium 132, CO2 20, BUN 108 creatinine 8.0 Lactic acid 1.3 within normal range, WBC 12.9 elevated UA positive EKG sinus bradycardia with right BBB, inferior T wave inversion. CT head negative for any acute findings CT C-spine negative for any acute trauma CT A/P: 1. Moderate right and mild left hydroureteronephrosis. No urinary calculi. 2. Foley catheter within the bladder. The bladder is not well evaluated due to streak artifact. There may be some bladder wall thickening and perivesical stranding. Locules of gas either within the bladder lumen or wall. Correlate for cystitis. 3.  Small right pleural effusion with pleural thickening similar to CT chest 04/09/2021. US renal: Mild bilateral hydronephrosis. Layering debris within the urinary bladder. Urine culture grew Klebsiella treated with Zosyn and later Rocephin therapy. His kidney function closely monitored. Kidney function improving and electrolytes improved. He will need to follow up with PCP, Nephrology and urology upon discharge.   Assessment and Plan: Sepsis due to UTI UA positive, urine culture growing Klebsiella oxytoca Blood culture NGTD. S/p Zosyn, transition to ceftriaxone on 9/2 finished course.   Urinary retention with bilateral hydronephrosis Foley catheter inserted, developed gross hematuria could be secondary to distended urinary bladder and traumatic Foley catheter insertion. Started Flomax Hematuria resolved, urologist cleared to start Eliquis on 9/1  Urology recommended to keep Foley catheter and follow with an outpatient for voiding trial. F/u with Urology on 09/26 at 9:15 with Dr. Lonna Cobb and 3:00 pm with PA.   Septic Encephalopathy- Change in mental status likely due to underlying sepsis. Patient's confusion did improve.  He is back to baseline.   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 Baseline Creatinine around 1.5. Presented with urinary retention and bilateral hydronephrosis Continue foley care, monitor renal function and urine output Avoid nephrotoxic medication, use renally dose medications Nephrology on board, creatinine 8.08--2.5 gradually improving, no urgent HD need. Out patient nephrology follow up   Hypokalemia-  Improved.   Paroxysmal A-fib, HTN, sick sinus syndrome. Eliquis resumed, hematuria resolved. Monitor BP and titrate medication accordingly, resumed metoprolol.  Hematochezia  On and off dark stools noted.  Continue pantoprazole 40 mg oral twice daily. GI recommended conservative management and continue Eliquis.    Fall at home  Recurrent falls. Continue fall precautions. PT and OT recommended SNF.  Marland Kitchen       Consultants: Nephrology, GI, Urology Procedures performed: none  Disposition: Skilled nursing facility Diet recommendation:  Discharge Diet Orders (From admission, onward)     Start     Ordered   12/03/22 0000  Diet - low sodium heart healthy        12/03/22 1306           Cardiac diet DISCHARGE MEDICATION: Allergies as of 12/03/2022   No Known Allergies      Medication List     STOP taking these medications    hydrALAZINE 25 MG tablet Commonly known as: APRESOLINE   hydrochlorothiazide 25 MG tablet Commonly known as: HYDRODIURIL       TAKE these medications    acetaminophen 500 MG tablet Commonly known as: TYLENOL Take 2 tablets (1,000 mg total) by mouth every 6 (six) hours as needed (pain).   apixaban 2.5 MG Tabs tablet Commonly known as: ELIQUIS Take 1 tablet (2.5 mg total) by mouth 2 (two) times daily.   feeding supplement Liqd Take 237 mLs by mouth 3 (three) times daily between meals.   Icy Hot Original Pain Relief 2.5 % Gel Generic drug: Menthol (Topical Analgesic) Apply topically as needed.   lidocaine 5 % Commonly known as: Lidoderm Place 1 patch onto the skin every 12 (twelve) hours. Remove & Discard patch within 12 hours or as directed by MD   melatonin 3 MG Tabs tablet Take 6 mg by mouth at bedtime.   metoprolol succinate 25 MG 24 hr tablet Commonly known as: TOPROL-XL Take 0.5 tablets (12.5 mg total) by mouth daily.   nystatin powder Commonly known as: MYCOSTATIN/NYSTOP Apply topically 3 (three) times daily.   ondansetron 4 MG tablet Commonly known as: ZOFRAN Take 1 tablet (4 mg total) by mouth every 6 (six) hours as needed for nausea.   pantoprazole 40 MG tablet Commonly known as: PROTONIX Take 1 tablet (40 mg total) by mouth 2 (two) times daily.   tamsulosin 0.4 MG Caps capsule Commonly known as: FLOMAX Take 1 capsule (0.4  mg total) by mouth daily. Start taking on: December 04, 2022        Contact information for after-discharge care     Destination     [facility] .   Service: Skilled Nursing Contact information: [address] [place] [address] [phone]                    Discharge Exam: Ceasar Mons Weights   11/30/22 0400 12/01/22 0405 12/02/22 0500  Weight: 85.5 kg 87.8 kg 87.7 kg   General -Elderly Caucasian male, sitting in no apparent distress HEENT - PERRLA, EOMI, atraumatic head, non tender sinuses. Lung -CTA, bibasilar Rales Heart - S1, S2 heard, no murmurs, rubs, 1+ pedal edema Neuro - Alert, awake and oriented, non focal exam. Skin - Warm and dry.   Condition at discharge: stable  The results of significant diagnostics from this hospitalization (including imaging, microbiology, ancillary and laboratory) are listed below for reference.   Imaging Studies: CT ABDOMEN PELVIS WO CONTRAST  Result Date: 11/25/2022 CLINICAL DATA:  Kidney failure, acute hematuria EXAM: CT ABDOMEN AND PELVIS WITHOUT CONTRAST TECHNIQUE: Multidetector CT imaging of the abdomen and pelvis was performed following the standard protocol without IV contrast. RADIATION DOSE  REDUCTION: This exam was performed according to the departmental dose-optimization program which includes automated exposure control, adjustment of the mA and/or kV according to patient size and/or use of iterative reconstruction technique. COMPARISON:  Renal ultrasound 11/25/2022 FINDINGS: Lower chest: Small right pleural effusion. There is pleural thickening about the right pleural effusion. Trace left pleural effusion. Bibasilar airspace opacities. Findings are similar to CT chest 04/09/2021. Hepatobiliary: Unremarkable noncontrast appearance of the liver, gallbladder, and biliary tree. Pancreas: Unremarkable. Spleen: Unremarkable. Adrenals/Urinary Tract: Normal adrenal glands. Moderate right and mild left  hydroureteronephrosis. No urinary calculi. Nonspecific perinephric stranding bilaterally. Foley catheter within the bladder which is poorly evaluated due to streak artifact. Question wall thickening and perivesical stranding. There are some locules of gas either within the bladder lumen or wall. Stomach/Bowel: Normal caliber large and small bowel. Colonic diverticulosis without diverticulitis. The appendix is not definitively visualized. Stomach is within normal limits other than a small hiatal hernia. Vascular/Lymphatic: Aortic atherosclerosis. No enlarged abdominal or pelvic lymph nodes. Reproductive: Not well evaluated due to streak artifact. No evidence of adnexal mass. Other: Small volume free fluid in the pelvis. No free intraperitoneal air. Musculoskeletal: No acute fracture.  Bilateral THA is. IMPRESSION: 1. Moderate right and mild left hydroureteronephrosis. No urinary calculi. 2. Foley catheter within the bladder. The bladder is not well evaluated due to streak artifact. There may be some bladder wall thickening and perivesical stranding. Locules of gas either within the bladder lumen or wall. Correlate for cystitis. 3. Small right pleural effusion with pleural thickening similar to CT chest 04/09/2021. Aortic Atherosclerosis (ICD10-I70.0). Electronically Signed   By: Minerva Fester M.D.   On: 11/25/2022 22:47   US Renal  Result Date: 11/25/2022 CLINICAL DATA:  Acute kidney injury EXAM: RENAL / URINARY TRACT ULTRASOUND COMPLETE COMPARISON:  None Available. FINDINGS: Right Kidney: Renal measurements: 11.7 x 5.7 x 6.7 cm = volume: 233 mL. Mild right hydronephrosis. No mass. Normal echotexture. Left Kidney: Renal measurements: 11.5 x 5.8 x 5.3 cm = volume: 183 mL. Mild hydronephrosis. No mass. Normal echotexture. Bladder: Partially decompressed with Foley catheter in in place. Layering debris. Other: None. IMPRESSION: Mild bilateral hydronephrosis. Layering debris within the urinary bladder.  Electronically Signed   By: Charlett Nose M.D.   On: 11/25/2022 20:34   CT Head Wo Contrast  Result Date: 11/25/2022 CLINICAL DATA:  Fall, head injury EXAM: CT HEAD WITHOUT CONTRAST CT CERVICAL SPINE WITHOUT CONTRAST TECHNIQUE: Multidetector CT imaging of the head and cervical spine was performed following the standard protocol without intravenous contrast. Multiplanar CT image reconstructions of the cervical spine were also generated. RADIATION DOSE REDUCTION: This exam was performed according to the departmental dose-optimization program which includes automated exposure control, adjustment of the mA and/or kV according to patient size and/or use of iterative reconstruction technique. COMPARISON:  CT head dated 08/17/2022. CT cervical spine dated 03/08/2022. FINDINGS: CT HEAD FINDINGS Brain: No evidence of acute infarction, hemorrhage, hydrocephalus, extra-axial collection or mass lesion/mass effect. Global cortical and central atrophy, temporal lobe predominant. Extensive subcortical white matter and periventricular small vessel ischemic changes. Vascular: No hyperdense vessel or unexpected calcification. Skull: Normal. Negative for fracture or focal lesion. Sinuses/Orbits: The visualized paranasal sinuses are essentially clear. The mastoid air cells are unopacified. Other: None. CT CERVICAL SPINE FINDINGS Alignment: Normal cervical lordosis. Skull base and vertebrae: No acute fracture. No primary bone lesion or focal pathologic process. Soft tissues and spinal canal: No prevertebral fluid or swelling. No visible canal hematoma. Disc levels: Mild degenerative changes of  the mid cervical spine. Spinal canal is patent. Upper chest: Visualized lung apices are clear. Other: Visualized thyroid is unremarkable. IMPRESSION: No acute intracranial abnormality. Atrophy with small vessel ischemic changes. No traumatic injury to the cervical spine. Mild degenerative changes. Electronically Signed   By: Charline Bills  M.D.   On: 11/25/2022 16:20   CT CERVICAL SPINE WO CONTRAST  Result Date: 11/25/2022 CLINICAL DATA:  Fall, head injury EXAM: CT HEAD WITHOUT CONTRAST CT CERVICAL SPINE WITHOUT CONTRAST TECHNIQUE: Multidetector CT imaging of the head and cervical spine was performed following the standard protocol without intravenous contrast. Multiplanar CT image reconstructions of the cervical spine were also generated. RADIATION DOSE REDUCTION: This exam was performed according to the departmental dose-optimization program which includes automated exposure control, adjustment of the mA and/or kV according to patient size and/or use of iterative reconstruction technique. COMPARISON:  CT head dated 08/17/2022. CT cervical spine dated 03/08/2022. FINDINGS: CT HEAD FINDINGS Brain: No evidence of acute infarction, hemorrhage, hydrocephalus, extra-axial collection or mass lesion/mass effect. Global cortical and central atrophy, temporal lobe predominant. Extensive subcortical white matter and periventricular small vessel ischemic changes. Vascular: No hyperdense vessel or unexpected calcification. Skull: Normal. Negative for fracture or focal lesion. Sinuses/Orbits: The visualized paranasal sinuses are essentially clear. The mastoid air cells are unopacified. Other: None. CT CERVICAL SPINE FINDINGS Alignment: Normal cervical lordosis. Skull base and vertebrae: No acute fracture. No primary bone lesion or focal pathologic process. Soft tissues and spinal canal: No prevertebral fluid or swelling. No visible canal hematoma. Disc levels: Mild degenerative changes of the mid cervical spine. Spinal canal is patent. Upper chest: Visualized lung apices are clear. Other: Visualized thyroid is unremarkable. IMPRESSION: No acute intracranial abnormality. Atrophy with small vessel ischemic changes. No traumatic injury to the cervical spine. Mild degenerative changes. Electronically Signed   By: Charline Bills M.D.   On: 11/25/2022 16:20     Microbiology: Results for orders placed or performed during the hospital encounter of 11/25/22  Urine Culture     Status: Abnormal   Collection Time: 11/25/22  3:46 PM   Specimen: Urine, Catheterized  Result Value Ref Range Status   Specimen Description   Final    URINE, CATHETERIZED Performed at [facility], [address], [place], [place] [address]    Special Requests   Final    NONE Performed at [facility], [address] [place], [place] [address]    Culture >=100,000 COLONIES/mL KLEBSIELLA OXYTOCA (A)  Final   Report Status 11/28/2022 FINAL  Final   Organism ID, Bacteria KLEBSIELLA OXYTOCA (A)  Final      Susceptibility   Klebsiella oxytoca - MIC*    AMPICILLIN >=32 RESISTANT Resistant     CEFEPIME <=0.12 SENSITIVE Sensitive     CEFTRIAXONE <=0.25 SENSITIVE Sensitive     CIPROFLOXACIN <=0.25 SENSITIVE Sensitive     GENTAMICIN <=1 SENSITIVE Sensitive     IMIPENEM <=0.25 SENSITIVE Sensitive     NITROFURANTOIN 32 SENSITIVE Sensitive     TRIMETH/SULFA <=20 SENSITIVE Sensitive     AMPICILLIN/SULBACTAM 8 SENSITIVE Sensitive     PIP/TAZO <=4 SENSITIVE Sensitive     * >=100,000 COLONIES/mL KLEBSIELLA OXYTOCA  Culture, blood (Routine X 2) w Reflex to ID Panel     Status: None   Collection Time: 11/25/22  9:48 PM   Specimen: BLOOD  Result Value Ref Range Status   Specimen Description BLOOD RIGHT ANTECUBITAL  Final   Special Requests   Final    BOTTLES  DRAWN AEROBIC AND ANAEROBIC Blood Culture adequate volume   Culture   Final    NO GROWTH 5 DAYS Performed at [facility], [address] [place], [place] [address]    Report Status 12/01/2022 FINAL  Final  Culture, blood (Routine X 2) w Reflex to ID Panel     Status: None   Collection Time: 11/25/22  9:48 PM   Specimen: BLOOD  Result Value Ref Range Status   Specimen Description BLOOD BLOOD RIGHT FOREARM  Final   Special Requests   Final    BOTTLES DRAWN AEROBIC AND ANAEROBIC Blood  Culture adequate volume   Culture   Final    NO GROWTH 5 DAYS Performed at [facility], [address] [place], [place] [address]    Report Status 12/01/2022 FINAL  Final  C Difficile Quick Screen w PCR reflex     Status: None   Collection Time: 11/26/22  4:00 PM   Specimen: STOOL  Result Value Ref Range Status   C Diff antigen NEGATIVE NEGATIVE Final   C Diff toxin NEGATIVE NEGATIVE Final   C Diff interpretation No C. difficile detected.  Final    Comment: Performed at [facility], [address] [place], [place] [address]  Gastrointestinal Panel by PCR , Stool     Status: None   Collection Time: 11/26/22  4:00 PM   Specimen: Stool  Result Value Ref Range Status   Campylobacter species NOT DETECTED NOT DETECTED Final   Plesimonas shigelloides NOT DETECTED NOT DETECTED Final   Salmonella species NOT DETECTED NOT DETECTED Final   Yersinia enterocolitica NOT DETECTED NOT DETECTED Final   Vibrio species NOT DETECTED NOT DETECTED Final   Vibrio cholerae NOT DETECTED NOT DETECTED Final   Enteroaggregative E coli (EAEC) NOT DETECTED NOT DETECTED Final   Enteropathogenic E coli (EPEC) NOT DETECTED NOT DETECTED Final   Enterotoxigenic E coli (ETEC) NOT DETECTED NOT DETECTED Final   Shiga like toxin producing E coli (STEC) NOT DETECTED NOT DETECTED Final   Shigella/Enteroinvasive E coli (EIEC) NOT DETECTED NOT DETECTED Final   Cryptosporidium NOT DETECTED NOT DETECTED Final   Cyclospora cayetanensis NOT DETECTED NOT DETECTED Final   Entamoeba histolytica NOT DETECTED NOT DETECTED Final   Giardia lamblia NOT DETECTED NOT DETECTED Final   Adenovirus F40/41 NOT DETECTED NOT DETECTED Final   Astrovirus NOT DETECTED NOT DETECTED Final   Norovirus GI/GII NOT DETECTED NOT DETECTED Final   Rotavirus A NOT DETECTED NOT DETECTED Final   Sapovirus (I, II, IV, and V) NOT DETECTED NOT DETECTED Final    Comment: Performed at [facility], [address]  [place], [place] [address]    Labs: CBC: Recent Labs  Lab 11/29/22 0215 11/30/22 0605 12/01/22 0519 12/02/22 1431 12/03/22 0451  WBC 7.8 8.3 8.6 12.0* 11.5*  HGB 10.5* 11.7* 10.6* 11.7* 11.4*  HCT 31.8* 35.2* 32.0* 35.1* 35.0*  MCV 93.0 92.9 91.7 93.4 93.8  PLT 240 278 245 258 273   Basic Metabolic Panel: Recent Labs  Lab 11/27/22 0557 11/28/22 0501 11/29/22 0215 11/30/22 0605 12/01/22 0519 12/02/22 0936 12/03/22 0451  NA 135 142 145 145 141 139 142  K 4.0 3.3* 3.3* 3.4* 2.8* 3.4* 3.6  CL 104 105 108 105 104 104 107  CO2 20* 25 25 27 28 24 24   GLUCOSE 94 106* 106* 97 103* 98 109*  BUN 107* 98* 83* 64* 50* 42* 35*  CREATININE 7.18* 6.09* 4.76* 3.90* 3.14* 2.59* 2.31*  CALCIUM 8.4* 8.1* 8.3* 8.8* 8.3* 8.6* 8.5*  MG 2.4 2.2 2.0  --   --   --   --   PHOS 5.6* 4.5 3.6  --   --   --   --    Liver Function Tests: No results for input(s): "AST", "ALT", "ALKPHOS", "BILITOT", "PROT", "ALBUMIN" in the last 168 hours. CBG: Recent Labs  Lab 11/26/22 1627  GLUCAP 91    Discharge time spent: 39 minutes.  Signed: Marcelino Duster, MD [facility] 12/03/2022

## 2022-12-03 NOTE — Progress Notes (Signed)
[facility] [place], [place] 12/03/22  Subjective:   Hospital day # 8  Patient was recently admitted for a syncopal event and discharged from hospital on 10/21/2022.  During previous admission he was noted to have AKI of unclear cause.  His renal ultrasound showed mild bilateral hydronephrosis without obstruction.  Creatinine was 3.02 at the time of discharge and improving.   Renal: 09/06 0701 - 09/07 0700 In: -  Out: 2350 [Urine:2350] Lab Results  Component Value Date   CREATININE 2.31 (H) 12/03/2022   CREATININE 2.59 (H) 12/02/2022   CREATININE 3.14 (H) 12/01/2022   Sitting up in bed Appears well Tolerating small meals  UOP  Objective:  Vital signs in last 24 hours:  Temp:  [97.9 F (36.6 C)-98.8 F (37.1 C)] 98.6 F (37 C) (09/07 0801) Pulse Rate:  [72-78] 78 (09/07 0801) Resp:  [16-18] 16 (09/07 0801) BP: (126-146)/(74-95) 126/83 (09/07 0801) SpO2:  [97 %-100 %] 98 % (09/07 0801)  Weight change:  Filed Weights   11/30/22 0400 12/01/22 0405 12/02/22 0500  Weight: 85.5 kg 87.8 kg 87.7 kg    Intake/Output:    Intake/Output Summary (Last 24 hours) at 12/03/2022 1226 Last data filed at 12/03/2022 0804 Gross per 24 hour  Intake --  Output 2450 ml  Net -2450 ml     Physical Exam: General: NAD, laying in the bed  HEENT Moist oral mucous membranes  Pulm/lungs Normal breathing effort, diminished breath sounds  CVS/Heart Irregular rhythm  Abdomen:  Soft, nontender  Extremities: Trace to 1+ edema  Neurologic: Able to answer simple questions and follow commands  Skin: No acute rashes          Basic Metabolic Panel:  Recent Labs  Lab 11/27/22 0557 11/28/22 0501 11/29/22 0215 11/30/22 0605 12/01/22 0519 12/02/22 0936 12/03/22 0451  NA 135 142 145 145 141 139 142  K 4.0 3.3* 3.3* 3.4* 2.8* 3.4* 3.6  CL 104 105 108 105 104 104 107  CO2 20* 25 25 27 28 24 24   GLUCOSE 94 106* 106* 97 103* 98 109*  BUN 107* 98* 83* 64* 50* 42*  35*  CREATININE 7.18* 6.09* 4.76* 3.90* 3.14* 2.59* 2.31*  CALCIUM 8.4* 8.1* 8.3* 8.8* 8.3* 8.6* 8.5*  MG 2.4 2.2 2.0  --   --   --   --   PHOS 5.6* 4.5 3.6  --   --   --   --      CBC: Recent Labs  Lab 11/29/22 0215 11/30/22 0605 12/01/22 0519 12/02/22 1431 12/03/22 0451  WBC 7.8 8.3 8.6 12.0* 11.5*  HGB 10.5* 11.7* 10.6* 11.7* 11.4*  HCT 31.8* 35.2* 32.0* 35.1* 35.0*  MCV 93.0 92.9 91.7 93.4 93.8  PLT 240 278 245 258 273     No results found for: "HEPBSAG", "HEPBSAB", "HEPBIGM"    Microbiology:  Recent Results (from the past 240 hour(s))  Urine Culture     Status: Abnormal   Collection Time: 11/25/22  3:46 PM   Specimen: Urine, Catheterized  Result Value Ref Range Status   Specimen Description   Final    URINE, CATHETERIZED Performed at [facility], [address] [place], [place] [address]    Special Requests   Final    NONE Performed at [facility], [address] [place], [place] [address]    Culture >=100,000 COLONIES/mL KLEBSIELLA OXYTOCA (A)  Final   Report Status 11/28/2022 FINAL  Final   Organism ID, Bacteria KLEBSIELLA OXYTOCA (A)  Final      Susceptibility   Klebsiella oxytoca - MIC*    AMPICILLIN >=32 RESISTANT Resistant     CEFEPIME <=0.12 SENSITIVE Sensitive     CEFTRIAXONE <=0.25 SENSITIVE Sensitive     CIPROFLOXACIN <=0.25 SENSITIVE Sensitive     GENTAMICIN <=1 SENSITIVE Sensitive     IMIPENEM <=0.25 SENSITIVE Sensitive     NITROFURANTOIN 32 SENSITIVE Sensitive     TRIMETH/SULFA <=20 SENSITIVE Sensitive     AMPICILLIN/SULBACTAM 8 SENSITIVE Sensitive     PIP/TAZO <=4 SENSITIVE Sensitive     * >=100,000 COLONIES/mL KLEBSIELLA OXYTOCA  Culture, blood (Routine X 2) w Reflex to ID Panel     Status: None   Collection Time: 11/25/22  9:48 PM   Specimen: BLOOD  Result Value Ref Range Status   Specimen Description BLOOD RIGHT ANTECUBITAL  Final   Special Requests   Final    BOTTLES DRAWN AEROBIC AND ANAEROBIC Blood  Culture adequate volume   Culture   Final    NO GROWTH 5 DAYS Performed at [facility], [address] [place], [place] [address]    Report Status 12/01/2022 FINAL  Final  Culture, blood (Routine X 2) w Reflex to ID Panel     Status: None   Collection Time: 11/25/22  9:48 PM   Specimen: BLOOD  Result Value Ref Range Status   Specimen Description BLOOD BLOOD RIGHT FOREARM  Final   Special Requests   Final    BOTTLES DRAWN AEROBIC AND ANAEROBIC Blood Culture adequate volume   Culture   Final    NO GROWTH 5 DAYS Performed at [facility], [address] [place], [place] [address]    Report Status 12/01/2022 FINAL  Final  C Difficile Quick Screen w PCR reflex     Status: None   Collection Time: 11/26/22  4:00 PM   Specimen: STOOL  Result Value Ref Range Status   C Diff antigen NEGATIVE NEGATIVE Final   C Diff toxin NEGATIVE NEGATIVE Final   C Diff interpretation No C. difficile detected.  Final    Comment: Performed at [facility], [address] [place], [place] [address]  Gastrointestinal Panel by PCR , Stool     Status: None   Collection Time: 11/26/22  4:00 PM   Specimen: Stool  Result Value Ref Range Status   Campylobacter species NOT DETECTED NOT DETECTED Final   Plesimonas shigelloides NOT DETECTED NOT DETECTED Final   Salmonella species NOT DETECTED NOT DETECTED Final   Yersinia enterocolitica NOT DETECTED NOT DETECTED Final   Vibrio species NOT DETECTED NOT DETECTED Final   Vibrio cholerae NOT DETECTED NOT DETECTED Final   Enteroaggregative E coli (EAEC) NOT DETECTED NOT DETECTED Final   Enteropathogenic E coli (EPEC) NOT DETECTED NOT DETECTED Final   Enterotoxigenic E coli (ETEC) NOT DETECTED NOT DETECTED Final   Shiga like toxin producing E coli (STEC) NOT DETECTED NOT DETECTED Final   Shigella/Enteroinvasive E coli (EIEC) NOT DETECTED NOT DETECTED Final   Cryptosporidium NOT DETECTED NOT DETECTED Final   Cyclospora cayetanensis NOT  DETECTED NOT DETECTED Final   Entamoeba histolytica NOT DETECTED NOT DETECTED Final   Giardia lamblia NOT DETECTED NOT DETECTED Final   Adenovirus F40/41 NOT DETECTED NOT DETECTED Final   Astrovirus NOT DETECTED NOT DETECTED Final   Norovirus GI/GII NOT DETECTED NOT DETECTED Final   Rotavirus A NOT DETECTED NOT DETECTED Final   Sapovirus (I, II, IV, and V) NOT DETECTED NOT DETECTED Final  Comment: Performed at [facility], [address] [place], [place] [address]    Coagulation Studies: No results for input(s): "LABPROT", "INR" in the last 72 hours.  Urinalysis: No results for input(s): "COLORURINE", "LABSPEC", "PHURINE", "GLUCOSEU", "HGBUR", "BILIRUBINUR", "KETONESUR", "PROTEINUR", "UROBILINOGEN", "NITRITE", "LEUKOCYTESUR" in the last 72 hours.  Invalid input(s): "APPERANCEUR"     Imaging: No results found.   Medications:      apixaban  2.5 mg Oral BID   Chlorhexidine Gluconate Cloth  6 each Topical Daily   feeding supplement  237 mL Oral TID BM   pantoprazole  40 mg Oral BID   sodium chloride flush  3 mL Intravenous Q12H   tamsulosin  0.4 mg Oral Daily   acetaminophen **OR** acetaminophen, morphine injection  Assessment/ Plan:  82 y.o. male with history of atrial fibrillation, recent reverse arthroplasty of left shoulder 09/22/2022 by Dr Joice Lofts, hypertension admitted on 11/25/2022 for Urinary retention [R33.9] Fall [W19.XXXA] Acute cystitis without hematuria [N30.00] AKI (acute kidney injury) (HCC) [N17.9] Fall, initial encounter [W19.XXXA]   #Acute kidney injury secondary to acute urinary retention and bilateral hydronephrosis.   Patient's baseline creatinine is 1.17 from 09/23/2022 Patient has been evaluated by urologist.  His most recent CT scan from 11/25/2022 showed moderate right and mild left hydronephrosis with no obvious obstructing stone.  Renal function continues to improve, 2.31 today.  Continue to encourage adequate oral intake.  Will  schedule appointment in our office at discharge to continue to monitor.  #Gross hematuria Secondary to Eliquis and bladder distention injury. Foley catheter remains in place.  Urology will continue to follow outpatient.  #Acute metabolic acidosis Sodium bicarbonate corrected   #Hypokalemia Corrected to 3.6 with oral supplementation    LOS: 8 [facility] 9/7/202412:26 PM  [facility] [place], [place] [phone]

## 2022-12-05 NOTE — Group Note (Deleted)

## 2022-12-09 ENCOUNTER — Other Ambulatory Visit: Payer: Self-pay

## 2022-12-09 DIAGNOSIS — I480 Paroxysmal atrial fibrillation: Secondary | ICD-10-CM | POA: Insufficient documentation

## 2022-12-09 DIAGNOSIS — L89152 Pressure ulcer of sacral region, stage 2: Secondary | ICD-10-CM | POA: Diagnosis not present

## 2022-12-09 DIAGNOSIS — R55 Syncope and collapse: Secondary | ICD-10-CM | POA: Diagnosis not present

## 2022-12-09 DIAGNOSIS — N179 Acute kidney failure, unspecified: Secondary | ICD-10-CM | POA: Diagnosis not present

## 2022-12-09 DIAGNOSIS — Z85828 Personal history of other malignant neoplasm of skin: Secondary | ICD-10-CM | POA: Insufficient documentation

## 2022-12-09 DIAGNOSIS — I10 Essential (primary) hypertension: Secondary | ICD-10-CM | POA: Insufficient documentation

## 2022-12-09 DIAGNOSIS — Z7901 Long term (current) use of anticoagulants: Secondary | ICD-10-CM | POA: Diagnosis not present

## 2022-12-09 DIAGNOSIS — Z96643 Presence of artificial hip joint, bilateral: Secondary | ICD-10-CM | POA: Insufficient documentation

## 2022-12-09 DIAGNOSIS — G9341 Metabolic encephalopathy: Secondary | ICD-10-CM | POA: Insufficient documentation

## 2022-12-09 DIAGNOSIS — R0902 Hypoxemia: Secondary | ICD-10-CM | POA: Diagnosis not present

## 2022-12-09 DIAGNOSIS — Z79899 Other long term (current) drug therapy: Secondary | ICD-10-CM | POA: Insufficient documentation

## 2022-12-09 DIAGNOSIS — U071 COVID-19: Secondary | ICD-10-CM | POA: Insufficient documentation

## 2022-12-09 DIAGNOSIS — R531 Weakness: Secondary | ICD-10-CM | POA: Diagnosis present

## 2022-12-09 LAB — BASIC METABOLIC PANEL
Anion gap: 14 (ref 5–15)
BUN: 29 mg/dL — ABNORMAL HIGH (ref 8–23)
CO2: 24 mmol/L (ref 22–32)
Calcium: 9 mg/dL (ref 8.9–10.3)
Chloride: 97 mmol/L — ABNORMAL LOW (ref 98–111)
Creatinine, Ser: 2.06 mg/dL — ABNORMAL HIGH (ref 0.61–1.24)
GFR, Estimated: 32 mL/min — ABNORMAL LOW (ref >60)
Glucose, Bld: 123 mg/dL — ABNORMAL HIGH (ref 70–99)
Potassium: 3.8 mmol/L (ref 3.5–5.1)
Sodium: 135 mmol/L (ref 135–145)

## 2022-12-09 LAB — TROPONIN I (HIGH SENSITIVITY): Troponin I (High Sensitivity): 14 ng/L (ref <18)

## 2022-12-09 LAB — CBC
HCT: 38.9 % — ABNORMAL LOW (ref 39.0–52.0)
Hemoglobin: 12.6 g/dL — ABNORMAL LOW (ref 13.0–17.0)
MCH: 30.3 pg (ref 26.0–34.0)
MCHC: 32.4 g/dL (ref 30.0–36.0)
MCV: 93.5 fL (ref 80.0–100.0)
Platelets: 339 10*3/uL (ref 150–400)
RBC: 4.16 MIL/uL — ABNORMAL LOW (ref 4.22–5.81)
RDW: 13.7 % (ref 11.5–15.5)
WBC: 8.7 10*3/uL (ref 4.0–10.5)
nRBC: 0 % (ref 0.0–0.2)

## 2022-12-09 NOTE — ED Triage Notes (Signed)
Pt comes via EMS from [facility] with c/o weakness. Pt states apparently had a blank moment while employee was getting him up. Pt is covid + dx last Wed. Pt is on thinners. Pt had uti two weeks ago.   Pt denies any pain, cp or sob.

## 2022-12-10 ENCOUNTER — Observation Stay: Payer: Medicare Other

## 2022-12-10 ENCOUNTER — Observation Stay
Admission: EM | Admit: 2022-12-10 | Discharge: 2022-12-14 | Disposition: A | Discharge status: Skilled Nursing Facility | Payer: Medicare Other | Attending: Hospitalist | Admitting: Hospitalist

## 2022-12-10 ENCOUNTER — Emergency Department: Payer: Medicare Other

## 2022-12-10 DIAGNOSIS — I495 Sick sinus syndrome: Secondary | ICD-10-CM | POA: Diagnosis present

## 2022-12-10 DIAGNOSIS — N179 Acute kidney failure, unspecified: Secondary | ICD-10-CM | POA: Diagnosis present

## 2022-12-10 DIAGNOSIS — R55 Syncope and collapse: Principal | ICD-10-CM | POA: Diagnosis present

## 2022-12-10 DIAGNOSIS — I10 Essential (primary) hypertension: Secondary | ICD-10-CM | POA: Diagnosis present

## 2022-12-10 DIAGNOSIS — R404 Transient alteration of awareness: Secondary | ICD-10-CM | POA: Diagnosis not present

## 2022-12-10 DIAGNOSIS — Z978 Presence of other specified devices: Secondary | ICD-10-CM

## 2022-12-10 DIAGNOSIS — R0902 Hypoxemia: Secondary | ICD-10-CM

## 2022-12-10 DIAGNOSIS — I480 Paroxysmal atrial fibrillation: Secondary | ICD-10-CM | POA: Diagnosis present

## 2022-12-10 LAB — URINALYSIS, ROUTINE W REFLEX MICROSCOPIC
Bilirubin Urine: NEGATIVE
Glucose, UA: NEGATIVE mg/dL
Ketones, ur: NEGATIVE mg/dL
Leukocytes,Ua: NEGATIVE
Nitrite: NEGATIVE
Protein, ur: 30 mg/dL — AB
RBC / HPF: >50 RBC/hpf (ref 0–5)
Specific Gravity, Urine: 1.013 (ref 1.005–1.030)
pH: 5 (ref 5.0–8.0)

## 2022-12-10 LAB — D-DIMER, QUANTITATIVE: D-Dimer, Quant: 1.13 ug{FEU}/mL — ABNORMAL HIGH (ref 0.00–0.50)

## 2022-12-10 LAB — SARS CORONAVIRUS 2 BY RT PCR: SARS Coronavirus 2 by RT PCR: POSITIVE — AB

## 2022-12-10 LAB — CBG MONITORING, ED: Glucose-Capillary: 87 mg/dL (ref 70–99)

## 2022-12-10 MED ORDER — APIXABAN 2.5 MG PO TABS
2.5000 mg | ORAL_TABLET | Freq: Two times a day (BID) | ORAL | Status: DC
  Administered 2022-12-10 – 2022-12-14 (×9): 2.5 mg via ORAL
  Filled 2022-12-10 (×9): qty 1

## 2022-12-10 MED ORDER — SODIUM CHLORIDE 0.9% FLUSH
3.0000 mL | Freq: Two times a day (BID) | INTRAVENOUS | Status: DC
  Administered 2022-12-10 – 2022-12-14 (×8): 3 mL via INTRAVENOUS

## 2022-12-10 MED ORDER — CHLORHEXIDINE GLUCONATE CLOTH 2 % EX PADS
6.0000 | MEDICATED_PAD | Freq: Every day | CUTANEOUS | Status: DC
  Administered 2022-12-12 – 2022-12-14 (×3): 6 via TOPICAL

## 2022-12-10 MED ORDER — ONDANSETRON HCL 4 MG PO TABS: 4.0000 mg | ORAL_TABLET | Freq: Four times a day (QID) | ORAL | Status: DC | PRN

## 2022-12-10 MED ORDER — SODIUM CHLORIDE 0.9 % IV SOLN: INTRAVENOUS | Status: DC

## 2022-12-10 MED ORDER — SODIUM CHLORIDE 0.9 % IV SOLN: INTRAVENOUS | Status: AC

## 2022-12-10 MED ORDER — ONDANSETRON HCL 4 MG/2ML IJ SOLN: 4.0000 mg | Freq: Four times a day (QID) | INTRAMUSCULAR | Status: DC | PRN

## 2022-12-10 MED ORDER — SODIUM CHLORIDE 0.9 % IV BOLUS
1000.0000 mL | Freq: Once | INTRAVENOUS | Status: AC
  Administered 2022-12-10: 1000 mL via INTRAVENOUS

## 2022-12-10 MED ORDER — LORAZEPAM 2 MG/ML IJ SOLN: 2.0000 mg | INTRAMUSCULAR | Status: DC | PRN

## 2022-12-10 NOTE — Plan of Care (Signed)
  Problem: Education: Goal: Knowledge of condition and prescribed therapy will improve Outcome: Progressing   Problem: Cardiac: Goal: Will achieve and/or maintain adequate cardiac output Outcome: Progressing   Problem: Physical Regulation: Goal: Complications related to the disease process, condition or treatment will be avoided or minimized Outcome: Progressing   Problem: Education: Goal: Knowledge of General Education information will improve Description: Including pain rating scale, medication(s)/side effects and non-pharmacologic comfort measures Outcome: Progressing   Problem: Health Behavior/Discharge Planning: Goal: Ability to manage health-related needs will improve Outcome: Progressing   Problem: Clinical Measurements: Goal: Ability to maintain clinical measurements within normal limits will improve Outcome: Progressing Goal: Will remain free from infection Outcome: Progressing Goal: Diagnostic test results will improve Outcome: Progressing Goal: Respiratory complications will improve Outcome: Progressing Goal: Cardiovascular complication will be avoided Outcome: Progressing   Problem: Activity: Goal: Risk for activity intolerance will decrease Outcome: Progressing   Problem: Nutrition: Goal: Adequate nutrition will be maintained Outcome: Progressing   Problem: Coping: Goal: Level of anxiety will decrease Outcome: Progressing   Problem: Elimination: Goal: Will not experience complications related to bowel motility Outcome: Progressing Goal: Will not experience complications related to urinary retention Outcome: Progressing Pt admitted with a foley catheter previously inserted on 11/25/22 for bladder outlet obstruction; chronic foley   Problem: Pain Managment: Goal: General experience of comfort will improve Outcome: Progressing   Problem: Safety: Goal: Ability to remain free from injury will improve Outcome: Progressing   Problem: Skin  Integrity: Goal: Risk for impaired skin integrity will decrease Outcome: Progressing

## 2022-12-10 NOTE — Assessment & Plan Note (Signed)
History of acute urinary retention with bilateral hydronephrosis requiring Foley placement 11/25/2022 History of Klebsiella UTI 11/25/2022 Patient due for urology follow-up 9/26 Urinalysis not consistent with UTI

## 2022-12-10 NOTE — Assessment & Plan Note (Signed)
Improving.  Creatinine 2.06 with baseline creatinine 1.12 September 2022 but has been downtrending from a high of 8.08 on 8/30 during recent hospitalization IV hydration

## 2022-12-10 NOTE — H&P (Addendum)
History and Physical    Patient: Steven Welch WUJ:[id number] DOB: 01/04/1941 DOA: 12/10/2022 DOS: the patient was seen and examined on 12/10/2022 PCP: Dorothey Baseman, MD  Patient coming from: SNF  Chief Complaint:  Chief Complaint  Patient presents with   Weakness    HPI: Steven Welch is a 82 y.o. male with medical history significant for paroxysmal A-fib on Eliquis, sick sinus syndrome, hypertension, frequent falls, recently hospitalized from 8/30 to 9/7 following a fall, when he was diagnosed with sepsis secondary to Klebsiella UTI,, having a Foley placed during his stay due to urinary retention with bilateral hydronephrosis, discharged to rehab, was sent from the rehab facility for evaluation of episodic brief periods of unresponsiveness in which she would " tense up" and lose consciousness and then return to baseline shortly thereafter.  The episodes occurred while he was in bed so he sustained no falls as a result.  After arrival in the emergency room he had a couple witnessed episodes, no seizure activity reported.  During the episodes he was reportedly " in and out" of A-fib on the monitor and had significant O2 desaturations to the 50s.  Patient was somnolent and unable to contribute to history  ED course and data review: On arrival vitals were within normal limits however he did occasionally bradycardia down to the 50s and he has some low recorded sats in the 40s and 50s which were reportedly during his unresponsive episodes. Labs: CBC was at baseline.  Normal WBC and hemoglobin 12.6 BMP with baseline creatinine of 2.06 Troponin 14 Urinalysis with large hemoglobin but not consistent with UTI. COVID  not done EKG, personally reviewed and interpreted showing NSR at 64 with RBBB CT head with no acute findings but showing global cortical and central atrophy, temporal lobe predominant secondary ventricular prominence, extensive subcortical white matter and periventricular small vessel  ischemic changes. Patient was placed on O2 at 2 L and is currently saturating in the high 90s He was treated with an NS bolus Hospitalist consulted for admission.     Past Medical History:  Diagnosis Date   Anxiety    Aortic atherosclerosis (HCC)    Asymptomatic bradycardia    Cyst of breast, right 1981   Gastric ulcer    GERD (gastroesophageal reflux disease)    Hepatitis 1988   History of shingles    Hyperlipidemia    Hypertension    Long term current use of anticoagulant    a.) apixaban   Osteoarthritis    PAF (paroxysmal atrial fibrillation) (HCC)    a.) CHA2DS2VASc = 4 (age x2, HTN, vascular disease history);  b.) rate/rhythm maintained on oral metoprolol tartrate; chronically anticoagulated with apixaban   RBBB (right bundle branch block)    Recurrent falls    Skin cancer of face    SSS (sick sinus syndrome) (HCC)    Past Surgical History:  Procedure Laterality Date   BREAST EXCISIONAL BIOPSY Right 1981   neg   COLON SURGERY     age 36 month old   REVERSE SHOULDER ARTHROPLASTY Left 09/22/2022   Procedure: REVERSE SHOULDER ARTHROPLASTY with BICEPS TENODESIS;  Surgeon: Christena Flake, MD;  Location: [facility] ORS;  Service: Orthopedics;  Laterality: Left;   TONSILLECTOMY     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   TOTAL HIP ARTHROPLASTY Right 01/01/2019   Procedure: TOTAL HIP ARTHROPLASTY ANTERIOR APPROACH;  Surgeon: Kennedy Bucker, MD;  Location: [facility] ORS;  Service: Orthopedics;  Laterality: Right;   Social History:  reports that he has  never smoked. He has never used smokeless tobacco. He reports that he does not currently use alcohol. He reports that he does not use drugs.  No Known Allergies  Family History  Problem Relation Age of Onset   CAD Mother    Lung cancer Father     Prior to Admission medications   Medication Sig Start Date End Date Taking? Authorizing Provider  acetaminophen (TYLENOL) 500 MG tablet Take 2 tablets (1,000 mg total) by mouth every 6 (six)  hours as needed (pain). 09/26/22   Anson Oregon, PA-C  apixaban (ELIQUIS) 2.5 MG TABS tablet Take 1 tablet (2.5 mg total) by mouth 2 (two) times daily. 12/03/22   Marcelino Duster, MD  feeding supplement (ENSURE ENLIVE / ENSURE PLUS) LIQD Take 237 mLs by mouth 3 (three) times daily between meals. 12/03/22   Marcelino Duster, MD  lidocaine (LIDODERM) 5 % Place 1 patch onto the skin every 12 (twelve) hours. Remove & Discard patch within 12 hours or as directed by MD 08/18/22 08/18/23  Delton Prairie, MD  melatonin 3 MG TABS tablet Take 6 mg by mouth at bedtime. 10/07/22   [provider]  Menthol, Topical Analgesic, (ICY HOT ORIGINAL PAIN RELIEF) 2.5 % GEL Apply topically as needed.    [provider]  metoprolol succinate (TOPROL-XL) 25 MG 24 hr tablet Take 0.5 tablets (12.5 mg total) by mouth daily. 12/03/22   Marcelino Duster, MD  nystatin (MYCOSTATIN/NYSTOP) powder Apply topically 3 (three) times daily. 12/03/22   Marcelino Duster, MD  ondansetron (ZOFRAN) 4 MG tablet Take 1 tablet (4 mg total) by mouth every 6 (six) hours as needed for nausea. 09/23/22   Anson Oregon, PA-C  pantoprazole (PROTONIX) 40 MG tablet Take 1 tablet (40 mg total) by mouth 2 (two) times daily. 12/03/22   Marcelino Duster, MD  tamsulosin (FLOMAX) 0.4 MG CAPS capsule Take 1 capsule (0.4 mg total) by mouth daily. 12/04/22   Marcelino Duster, MD    Physical Exam: Vitals:   12/10/22 8657 12/10/22 0214 12/10/22 0215 12/10/22 0215  BP:      Pulse: 80 (!) 56 61   Resp: 14 11 17    Temp:      TempSrc:      SpO2: (!) 56% (!) 48% (!) 70% 96%   Physical Exam Vitals and nursing note reviewed.  Constitutional:      General: He is not in acute distress. HENT:     Head: Normocephalic and atraumatic.  Cardiovascular:     Rate and Rhythm: Regular rhythm. Bradycardia present.     Heart sounds: Normal heart sounds.  Pulmonary:     Effort: Pulmonary effort is normal.     Breath sounds:  Normal breath sounds.  Abdominal:     Palpations: Abdomen is soft.     Tenderness: There is no abdominal tenderness.  Neurological:     General: No focal deficit present.     Mental Status: He is lethargic.     Labs on Admission: I have personally reviewed following labs and imaging studies  CBC: Recent Labs  Lab 12/03/22 0451 12/09/22 1808  WBC 11.5* 8.7  HGB 11.4* 12.6*  HCT 35.0* 38.9*  MCV 93.8 93.5  PLT 273 339   Basic Metabolic Panel: Recent Labs  Lab 12/03/22 0451 12/09/22 1808  NA 142 135  K 3.6 3.8  CL 107 97*  CO2 24 24  GLUCOSE 109* 123*  BUN 35* 29*  CREATININE 2.31* 2.06*  CALCIUM 8.5* 9.0  GFR: Estimated Creatinine Clearance: 31.4 mL/min (A) (by C-G formula based on SCr of 2.06 mg/dL (H)). Liver Function Tests: No results for input(s): "AST", "ALT", "ALKPHOS", "BILITOT", "PROT", "ALBUMIN" in the last 168 hours. No results for input(s): "LIPASE", "AMYLASE" in the last 168 hours. No results for input(s): "AMMONIA" in the last 168 hours. Coagulation Profile: No results for input(s): "INR", "PROTIME" in the last 168 hours. Cardiac Enzymes: No results for input(s): "CKTOTAL", "CKMB", "CKMBINDEX", "TROPONINI" in the last 168 hours. BNP (last 3 results) No results for input(s): "PROBNP" in the last 8760 hours. HbA1C: No results for input(s): "HGBA1C" in the last 72 hours. CBG: No results for input(s): "GLUCAP" in the last 168 hours. Lipid Profile: No results for input(s): "CHOL", "HDL", "LDLCALC", "TRIG", "CHOLHDL", "LDLDIRECT" in the last 72 hours. Thyroid Function Tests: No results for input(s): "TSH", "T4TOTAL", "FREET4", "T3FREE", "THYROIDAB" in the last 72 hours. Anemia Panel: No results for input(s): "VITAMINB12", "FOLATE", "FERRITIN", "TIBC", "IRON", "RETICCTPCT" in the last 72 hours. Urine analysis:    Component Value Date/Time   COLORURINE YELLOW (A) 12/10/2022 0136   APPEARANCEUR CLOUDY (A) 12/10/2022 0136   APPEARANCEUR Clear  02/12/2014 1357   LABSPEC 1.013 12/10/2022 0136   LABSPEC 1.018 02/12/2014 1357   PHURINE 5.0 12/10/2022 0136   GLUCOSEU NEGATIVE 12/10/2022 0136   GLUCOSEU Negative 02/12/2014 1357   HGBUR LARGE (A) 12/10/2022 0136   BILIRUBINUR NEGATIVE 12/10/2022 0136   BILIRUBINUR Negative 02/12/2014 1357   KETONESUR NEGATIVE 12/10/2022 0136   PROTEINUR 30 (A) 12/10/2022 0136   NITRITE NEGATIVE 12/10/2022 0136   LEUKOCYTESUR NEGATIVE 12/10/2022 0136   LEUKOCYTESUR Negative 02/12/2014 1357    Radiological Exams on Admission: CT Head Wo Contrast  Result Date: 12/10/2022 CLINICAL DATA:  Syncope, weakness, COVID EXAM: CT HEAD WITHOUT CONTRAST TECHNIQUE: Contiguous axial images were obtained from the base of the skull through the vertex without intravenous contrast. RADIATION DOSE REDUCTION: This exam was performed according to the departmental dose-optimization program which includes automated exposure control, adjustment of the mA and/or kV according to patient size and/or use of iterative reconstruction technique. COMPARISON:  11/25/2022 FINDINGS: Brain: No evidence of acute infarction, hemorrhage, hydrocephalus, extra-axial collection or mass lesion/mass effect. Global cortical and central atrophy, temporal lobe predominant. Secondary ventricular prominence. Extensive subcortical white matter and periventricular small vessel ischemic changes. Vascular: No hyperdense vessel or unexpected calcification. Skull: Normal. Negative for fracture or focal lesion. Sinuses/Orbits: The visualized paranasal sinuses are essentially clear. The mastoid air cells are unopacified. Other: None. Electronically Signed   By: Charline Bills M.D.   On: 12/10/2022 01:39     Data Reviewed: Relevant notes from primary care and specialist visits, past discharge summaries as available in EHR, including Care Everywhere. 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 Neurologic checks with fall and aspiration precautions Will get EEG, MRI brain Ativan as needed seizure Neurology consult  Hypoxic episodes Patient having severe desaturations reportedly to the 50s during periods of unresponsiveness  Uncertain etiology,  Will get chest x-ray D-dimer Continuous pulse ox On Eliquis so low suspicion for PE Continue supplemental oxygen   SSS (sick sinus syndrome) (HCC) History of symptomatic bradycardia Patient might need loop recorder to evaluate for concordance between episodes and heart rate/rhythm Continuous cardiac monitoring Can consider cardiology consult  PAF (paroxysmal atrial fibrillation) (HCC) Continue Eliquis Will hold any rate control agents due to possibility of symptomatic bradycardia  AKI (acute kidney injury) (HCC) Improving.  Creatinine 2.06 with baseline creatinine 1.12 September 2022 but has been downtrending from a high of 8.08 on 8/30 during recent hospitalization IV hydration  Foley catheter in place since 11/25/22 History of acute urinary retention with bilateral hydronephrosis requiring Foley placement 11/25/2022 History of Klebsiella UTI 11/25/2022 Patient due for urology follow-up 9/26 Urinalysis not consistent with UTI    DVT prophylaxis: Eliquis  Consults: neurology, Dr Selina Cooley  Advance Care Planning:    Code Status: Prior   Family Communication: none  Disposition Plan: Back to previous home environment  Severity of Illness: The appropriate patient status for this patient is INPATIENT. Inpatient status is judged to be reasonable and necessary in order to provide the required intensity of service to ensure the patient's safety. The patient's presenting symptoms, physical exam findings, and initial radiographic and laboratory data in the context of their chronic comorbidities is felt to place them at high risk for further clinical deterioration. Furthermore, it is not anticipated that the patient will be medically stable for discharge from the hospital within 2 midnights of admission.   * I certify that at the point of admission it is my clinical judgment that the patient will require inpatient hospital care spanning beyond 2 midnights from the point of admission due to high intensity of service, high risk for further deterioration and high frequency of surveillance required.*  Author: Andris Baumann, MD 12/10/2022 3:57 AM  For on call review [email or web address].

## 2022-12-10 NOTE — Assessment & Plan Note (Addendum)
Patient having severe desaturations reportedly to the 50s during periods of unresponsiveness Uncertain etiology,  Will get chest x-ray D-dimer Continuous pulse ox On Eliquis so low suspicion for PE Continue supplemental oxygen

## 2022-12-10 NOTE — Assessment & Plan Note (Signed)
History of symptomatic bradycardia Patient might need loop recorder to evaluate for concordance between episodes and heart rate/rhythm Continuous cardiac monitoring Can consider cardiology consult

## 2022-12-10 NOTE — Assessment & Plan Note (Signed)
Continue Eliquis Will hold any rate control agents due to possibility of symptomatic bradycardia

## 2022-12-10 NOTE — Assessment & Plan Note (Signed)
Foley catheter placed on around 11/25/2022 Klebsiella UTI 11/25/2022 s/p treatment Urinalysis was mostly unremarkable, WBC normal

## 2022-12-10 NOTE — ED Provider Notes (Signed)
[facility] Provider Note    Event Date/Time   First MD Initiated Contact with Patient 12/10/22 0040     (approximate)   History   Chief Complaint: Weakness   HPI  Blake Queenan is a 82 y.o. male with a history of hypertension, GERD, paroxysmal atrial fibrillation who was sent to the ED from SNF due to weakness.  Reportedly patient had a moment of seeming to lose alertness while at the SNF.  While in the waiting room of the ED, his wife joined him, and reports that he had another episode where he seemed to stiffen and become unresponsive.  This was observed by the triage nurse as well.  Currently he denies any pain states he feels fine.  Denies remembering the episodes, denies any pain or palpitations     Physical Exam   Triage Vital Signs: ED Triage Vitals  Encounter Vitals Group     BP 12/09/22 1755 102/64     Systolic BP Percentile --      Diastolic BP Percentile --      Pulse Rate 12/09/22 1755 63     Resp 12/09/22 1755 18     Temp 12/09/22 1755 97.8 F (36.6 C)     Temp Source 12/09/22 1755 Oral     SpO2 12/09/22 1755 97 %     Weight --      Height --      Head Circumference --      Peak Flow --      Pain Score 12/09/22 1729 0     Pain Loc --      Pain Education --      Exclude from Growth Chart --     Most recent vital signs: Vitals:   12/10/22 0215 12/10/22 0215  BP:    Pulse: 61   Resp: 17   Temp:    SpO2: (!) 70% 96%    General: Awake, no distress.  CV:  Good peripheral perfusion.  Regular rate and rhythm Resp:  Normal effort.  Clear to auscultation bilaterally Abd:  No distention.  Soft nontender Other:  Cranial nerves II through XII intact, no drift, normal cerebellar function.   ED Results / Procedures / Treatments   Labs (all labs ordered are listed, but only abnormal results are displayed) Labs Reviewed  BASIC METABOLIC PANEL - Abnormal; Notable for the following components:      Result Value   Chloride 97 (*)     Glucose, Bld 123 (*)    BUN 29 (*)    Creatinine, Ser 2.06 (*)    GFR, Estimated 32 (*)    All other components within normal limits  CBC - Abnormal; Notable for the following components:   RBC 4.16 (*)    Hemoglobin 12.6 (*)    HCT 38.9 (*)    All other components within normal limits  URINALYSIS, ROUTINE W REFLEX MICROSCOPIC - Abnormal; Notable for the following components:   Color, Urine YELLOW (*)    APPearance CLOUDY (*)    Hgb urine dipstick LARGE (*)    Protein, ur 30 (*)    Bacteria, UA RARE (*)    All other components within normal limits  CBG MONITORING, ED  TROPONIN I (HIGH SENSITIVITY)     EKG Interpreted by me Normal sinus rhythm rate of 64.  Normal axis, normal intervals, right bundle branch block.  No acute ischemic changes.   RADIOLOGY CT head interpreted by me, appears unremarkable.  Radiology  report reviewed   PROCEDURES:  Procedures   MEDICATIONS ORDERED IN ED: Medications  sodium chloride 0.9 % bolus 1,000 mL (1,000 mLs Intravenous New Bag/Given 12/10/22 0133)     IMPRESSION / MDM / ASSESSMENT AND PLAN / ED COURSE  I reviewed the triage vital signs and the nursing notes.  DDx: Intracranial hemorrhage, electrolyte abnormality, AKI, anemia, arrhythmia, UTI, seizure  Patient's presentation is most consistent with acute presentation with potential threat to life or bodily function.  Patient presents with 2 episodes of stiffening and losing consciousness.  He had a third in the ED treatment room, telemetry monitor showing intermittent episodes of atrial fibrillation.  Blood pressure is normal.  During the episode, oxygen saturation dropped into the 50s.  Initial workup with CT head, labs, urinalysis all unremarkable.  With his recurrent episodes, will need to hospitalize for further evaluation with particular concern for arrhythmia versus seizure.  Case discussed with hospitalist.       FINAL CLINICAL IMPRESSION(S) / ED DIAGNOSES   Final  diagnoses:  Syncope, unspecified syncope type  Paroxysmal atrial fibrillation (HCC)     Rx / DC Orders   ED Discharge Orders     None        Note:  This document was prepared using Dragon voice recognition software and may include unintentional dictation errors.   Sharman Cheek, MD 12/10/22 [phone]

## 2022-12-10 NOTE — ED Notes (Signed)
Desaturation to 50% on RA with good pleth. [place] initiated at 2L [place]

## 2022-12-10 NOTE — TOC Initial Note (Incomplete)
Transition of Care [facility]) - Initial/Assessment Note    Patient Details  Name: Steven Welch MRN: [id number] Date of Birth: 09/25/1940  Transition of Care [facility]) CM/SW Contact:    Allena Katz, LCSW Phone Number: 12/10/2022, 2:45 PM  Clinical Narrative:                   Expected Discharge Plan: Skilled Nursing Facility Barriers to Discharge: Continued Medical Work up   Patient Goals and CMS Choice Patient states their goals for this hospitalization and ongoing recovery are:: go back to [facility] CMS Medicare.gov Compare Post Acute Care list provided to:: Patient        Expected Discharge Plan and Services                                              Prior Living Arrangements/Services     Patient language and need for interpreter reviewed:: Yes Do you feel safe going back to the place where you live?: Yes               Activities of Daily Living Home Assistive Devices/Equipment: Dan Humphreys (specify type) ADL Screening (condition at time of admission) Patient's cognitive ability adequate to safely complete daily activities?: Yes Is the patient deaf or have difficulty hearing?: No Does the patient have difficulty seeing, even when wearing glasses/contacts?: No Does the patient have difficulty concentrating, remembering, or making decisions?: No Patient able to express need for assistance with ADLs?: Yes Does the patient have difficulty dressing or bathing?: No Independently performs ADLs?: Yes (appropriate for developmental age) Does the patient have difficulty walking or climbing stairs?: Yes Weakness of Legs: Both Weakness of Arms/Hands: Both  Permission Sought/Granted                  Emotional Assessment     Affect (typically observed): Accepting Orientation: : Fluctuating Orientation (Suspected and/or reported Sundowners)      Admission diagnosis:  Syncope and collapse [R55] Paroxysmal atrial fibrillation (HCC) [I48.0] Syncope, unspecified  syncope type [R55] Patient Active Problem List   Diagnosis Date Noted   Recurrent episodes of unresponsiveness 12/10/2022   Hypoxic episodes 12/10/2022   Foley catheter in place since 11/25/22 12/10/2022   Syncope and collapse 12/10/2022   Gross hematuria 11/25/2022   Fall 11/25/2022   Sepsis secondary to UTI (HCC) 11/25/2022   Rectal bleeding 10/19/2022   Hypokalemia 10/15/2022   Insomnia 10/15/2022   Postural dizziness with presyncope 10/14/2022   AKI (acute kidney injury) (HCC) 10/14/2022   Symptomatic bradycardia 10/14/2022   PAF (paroxysmal atrial fibrillation) (HCC)    SSS (sick sinus syndrome) (HCC)    Status post reverse arthroplasty of shoulder, 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 [place] [address] Phone: [phone] Fax: [phone]     Social Determinants of Health (SDOH) Social History: SDOH Screenings   Food Insecurity: No Food Insecurity (12/10/2022)  Housing: Low Risk  (12/10/2022)  Transportation Needs: No Transportation Needs (12/10/2022)  Utilities: Not At Risk (12/10/2022)  Financial Resource Strain: Patient Declined (02/22/2022)   Received from [facility], [facility]  Tobacco Use: Low Risk  (12/09/2022)   SDOH Interventions:     Readmission Risk Interventions     No data to display

## 2022-12-10 NOTE — TOC CM/SW Note (Signed)
CSW  received IM from MD to confirm covid status from SNF. I spoke with Tammy from [facility] she advised the patient tested positive for covid Wednesday, I relayed this information to the MD.

## 2022-12-10 NOTE — Assessment & Plan Note (Addendum)
Acute metabolic encephalopathy, possible chronic encephalopathy of unknown etiology Possible seizure History of frequent falls CT head with no acute findings but showing global cortical and central atrophy, temporal lobe predominant secondary ventricular prominence, extensive subcortical white matter and periventricular small vessel ischemic changes Will keep n.p.o. Neurologic checks with fall and aspiration precautions Will get EEG, MRI brain Ativan as needed seizure Neurology consult

## 2022-12-11 DIAGNOSIS — R404 Transient alteration of awareness: Secondary | ICD-10-CM

## 2022-12-11 LAB — BASIC METABOLIC PANEL
Anion gap: 8 (ref 5–15)
BUN: 32 mg/dL — ABNORMAL HIGH (ref 8–23)
CO2: 24 mmol/L (ref 22–32)
Calcium: 8.4 mg/dL — ABNORMAL LOW (ref 8.9–10.3)
Chloride: 105 mmol/L (ref 98–111)
Creatinine, Ser: 1.78 mg/dL — ABNORMAL HIGH (ref 0.61–1.24)
GFR, Estimated: 38 mL/min — ABNORMAL LOW (ref >60)
Glucose, Bld: 81 mg/dL (ref 70–99)
Potassium: 3.2 mmol/L — ABNORMAL LOW (ref 3.5–5.1)
Sodium: 137 mmol/L (ref 135–145)

## 2022-12-11 LAB — CBC
HCT: 31.2 % — ABNORMAL LOW (ref 39.0–52.0)
Hemoglobin: 10.3 g/dL — ABNORMAL LOW (ref 13.0–17.0)
MCH: 30.2 pg (ref 26.0–34.0)
MCHC: 33 g/dL (ref 30.0–36.0)
MCV: 91.5 fL (ref 80.0–100.0)
Platelets: 257 10*3/uL (ref 150–400)
RBC: 3.41 MIL/uL — ABNORMAL LOW (ref 4.22–5.81)
RDW: 13.4 % (ref 11.5–15.5)
WBC: 4.9 10*3/uL (ref 4.0–10.5)
nRBC: 0 % (ref 0.0–0.2)

## 2022-12-11 LAB — GLUCOSE, CAPILLARY: Glucose-Capillary: 77 mg/dL (ref 70–99)

## 2022-12-11 LAB — MAGNESIUM: Magnesium: 1.7 mg/dL (ref 1.7–2.4)

## 2022-12-11 MED ORDER — POTASSIUM CHLORIDE 20 MEQ PO PACK
40.0000 meq | PACK | Freq: Once | ORAL | Status: AC
  Administered 2022-12-11: 40 meq via ORAL
  Filled 2022-12-11: qty 2

## 2022-12-11 MED ORDER — ENSURE ENLIVE PO LIQD
237.0000 mL | Freq: Two times a day (BID) | ORAL | Status: DC
  Administered 2022-12-12 – 2022-12-14 (×5): 237 mL via ORAL

## 2022-12-11 NOTE — Plan of Care (Signed)
  Problem: Education: Goal: Knowledge of condition and prescribed therapy will improve Outcome: Progressing   Problem: Cardiac: Goal: Will achieve and/or maintain adequate cardiac output Outcome: Progressing   Problem: Physical Regulation: Goal: Complications related to the disease process, condition or treatment will be avoided or minimized Outcome: Progressing   Problem: Education: Goal: Knowledge of General Education information will improve Description: Including pain rating scale, medication(s)/side effects and non-pharmacologic comfort measures Outcome: Progressing   Problem: Health Behavior/Discharge Planning: Goal: Ability to manage health-related needs will improve Outcome: Progressing   Problem: Clinical Measurements: Goal: Ability to maintain clinical measurements within normal limits will improve Outcome: Progressing Goal: Will remain free from infection Outcome: Progressing Goal: Diagnostic test results will improve Outcome: Progressing Goal: Respiratory complications will improve Outcome: Progressing Goal: Cardiovascular complication will be avoided Outcome: Progressing   Problem: Activity: Goal: Risk for activity intolerance will decrease Outcome: Progressing   Problem: Coping: Goal: Level of anxiety will decrease Outcome: Progressing   Problem: Elimination: Goal: Will not experience complications related to bowel motility Outcome: Progressing Goal: Will not experience complications related to urinary retention Outcome: Progressing   Problem: Pain Managment: Goal: General experience of comfort will improve Outcome: Progressing   Problem: Safety: Goal: Ability to remain free from injury will improve Outcome: Progressing   Problem: Skin Integrity: Goal: Risk for impaired skin integrity will decrease Outcome: Progressing   Problem: Nutrition: Goal: Adequate nutrition will be maintained Outcome: Not Progressing  Pt with poor appetite this  shift

## 2022-12-11 NOTE — Progress Notes (Signed)
PROGRESS NOTE    Steven Welch  GNF:[id number] DOB: Aug 04, 1940 DOA: 12/10/2022 PCP: Dorothey Baseman, MD  106A/106A-AA  LOS: 0 days   Brief hospital course:   Assessment & Plan: Steven Welch is a 82 y.o. male with medical history significant for paroxysmal A-fib on Eliquis, sick sinus syndrome, hypertension, frequent falls, recently hospitalized from 8/30 to 9/7 following a fall, when he was diagnosed with sepsis secondary to Klebsiella UTI,, having a Foley placed during his stay due to urinary retention with bilateral hydronephrosis, discharged to rehab, was sent from the rehab facility for evaluation of episodic brief periods of unresponsiveness in which she would " tense up" and lose consciousness and then return to baseline shortly thereafter.  The episodes occurred while he was in bed so he sustained no falls as a result.  After arrival in the emergency room he had a couple witnessed episodes, no seizure activity reported.  During the episodes he was reportedly " in and out" of A-fib on the monitor and had significant O2 desaturations to the 50s.    * Recurrent episodes of unresponsiveness Acute metabolic encephalopathy, possible chronic encephalopathy of unknown etiology Possible seizure History of frequent falls CT head and MRI no acute finding --no witnessed event since presentation --EEG on Monday --cont tele  Hypoxic episodes Patient having severe desaturations reportedly to the 50s during periods of unresponsiveness Uncertain etiology. On Eliquis so low suspicion for PE CXR no acute finding.  SSS (sick sinus syndrome) (HCC) History of symptomatic bradycardia Patient might need loop recorder to evaluate for concordance between episodes and heart rate/rhythm Continuous cardiac monitoring Can consider cardiology consult  PAF (paroxysmal atrial fibrillation) (HCC) Continue Eliquis Will hold any rate control agents due to possibility of symptomatic bradycardia  AKI (acute  kidney injury) (HCC), ruled out --Cr 2.06 on presentation, was 2.31 a week ago.  Foley catheter in place since 11/25/22 History of acute urinary retention with bilateral hydronephrosis requiring Foley placement 11/25/2022 History of Klebsiella UTI 11/25/2022 Patient due for urology follow-up 9/26 Urinalysis not consistent with UTI   DVT prophylaxis: QI:[id number] Code Status: Full code  Family Communication: daughter updated on the phone today Level of care: Med-Surg Dispo:   The patient is from: SNF rehab Anticipated d/c is to: SNF rehab Anticipated d/c date is: 1-2 days   Subjective and Interval History:  Pt reported mild dyspnea, but no cough.   Objective: Vitals:   12/11/22 0500 12/11/22 0626 12/11/22 0808 12/11/22 1542  BP:  116/68 113/65 122/76  Pulse:  (!) 56 (!) 53 60  Resp:  16 15 18   Temp:  98.3 F (36.8 C) 97.8 F (36.6 C) 98 F (36.7 C)  TempSrc:  Oral    SpO2:  99% 98% 92%  Weight: 86.9 kg     Height:        Intake/Output Summary (Last 24 hours) at 12/11/2022 1735 Last data filed at 12/11/2022 1500 Gross per 24 hour  Intake 1070 ml  Output 800 ml  Net 270 ml   Filed Weights   12/10/22 1111 12/11/22 0500  Weight: 87.8 kg 86.9 kg    Examination:   Constitutional: NAD, alert, oriented to person and year, responding to questions appropriately HEENT: conjunctivae and lids normal, EOMI CV: No cyanosis.   RESP: normal respiratory effort Neuro: II - XII grossly intact.     Data Reviewed: I have personally reviewed labs and imaging studies  Time spent: 50 minutes  Darlin Priestly, MD [facility] If 7PM-7AM, please contact  night-coverage 12/11/2022, 5:35 PM

## 2022-12-11 NOTE — Plan of Care (Signed)
  Problem: Clinical Measurements: Goal: Diagnostic test results will improve Outcome: Progressing Goal: Respiratory complications will improve Outcome: Progressing   Problem: Activity: Goal: Risk for activity intolerance will decrease Outcome: Progressing   Problem: Coping: Goal: Level of anxiety will decrease Outcome: Progressing   Problem: Pain Managment: Goal: General experience of comfort will improve Outcome: Progressing   Problem: Safety: Goal: Ability to remain free from injury will improve Outcome: Progressing   Problem: Skin Integrity: Goal: Risk for impaired skin integrity will decrease Outcome: Progressing

## 2022-12-12 ENCOUNTER — Ambulatory Visit: Payer: Medicare Other

## 2022-12-12 DIAGNOSIS — R569 Unspecified convulsions: Secondary | ICD-10-CM | POA: Diagnosis not present

## 2022-12-12 DIAGNOSIS — R55 Syncope and collapse: Secondary | ICD-10-CM | POA: Diagnosis not present

## 2022-12-12 DIAGNOSIS — R404 Transient alteration of awareness: Secondary | ICD-10-CM | POA: Diagnosis not present

## 2022-12-12 LAB — BASIC METABOLIC PANEL
Anion gap: 10 (ref 5–15)
BUN: 30 mg/dL — ABNORMAL HIGH (ref 8–23)
CO2: 23 mmol/L (ref 22–32)
Calcium: 8.3 mg/dL — ABNORMAL LOW (ref 8.9–10.3)
Chloride: 106 mmol/L (ref 98–111)
Creatinine, Ser: 1.61 mg/dL — ABNORMAL HIGH (ref 0.61–1.24)
GFR, Estimated: 43 mL/min — ABNORMAL LOW (ref >60)
Glucose, Bld: 86 mg/dL (ref 70–99)
Potassium: 3.2 mmol/L — ABNORMAL LOW (ref 3.5–5.1)
Sodium: 139 mmol/L (ref 135–145)

## 2022-12-12 LAB — GLUCOSE, CAPILLARY: Glucose-Capillary: 81 mg/dL (ref 70–99)

## 2022-12-12 LAB — CBC
HCT: 31.8 % — ABNORMAL LOW (ref 39.0–52.0)
Hemoglobin: 10.4 g/dL — ABNORMAL LOW (ref 13.0–17.0)
MCH: 30.3 pg (ref 26.0–34.0)
MCHC: 32.7 g/dL (ref 30.0–36.0)
MCV: 92.7 fL (ref 80.0–100.0)
Platelets: 263 10*3/uL (ref 150–400)
RBC: 3.43 MIL/uL — ABNORMAL LOW (ref 4.22–5.81)
RDW: 13.4 % (ref 11.5–15.5)
WBC: 5.1 10*3/uL (ref 4.0–10.5)
nRBC: 0 % (ref 0.0–0.2)

## 2022-12-12 LAB — MAGNESIUM: Magnesium: 1.9 mg/dL (ref 1.7–2.4)

## 2022-12-12 MED ORDER — POTASSIUM CHLORIDE 20 MEQ PO PACK
40.0000 meq | PACK | Freq: Once | ORAL | Status: AC
  Administered 2022-12-12: 40 meq via ORAL
  Filled 2022-12-12: qty 2

## 2022-12-12 NOTE — Progress Notes (Signed)
Eeg done

## 2022-12-12 NOTE — TOC Initial Note (Signed)
Transition of Care [facility]) - Initial/Assessment Note    Patient Details  Name: Steven Welch MRN: [id number] Date of Birth: 07/03/1940  [facility]) CM/SW Contact:    Allena Katz, LCSW Phone Number: 12/12/2022, 10:49 AM  Clinical Narrative:   Pt admitted from [facility] under rehab. Csw spoke with daughter and wife who would like pt to return for rehab. CSW has requested PT/OT consults to start auth.                 Expected Discharge Plan: Skilled Nursing Facility Barriers to Discharge: Continued Medical Work up   Patient Goals and CMS Choice Patient states their goals for this hospitalization and ongoing recovery are:: go back to [facility] CMS Medicare.gov Compare Post Acute Care list provided to:: Patient Represenative (must comment) (wife and daughter)        Expected Discharge Plan and Services       Living arrangements for the past 2 months: Skilled Nursing Facility                                      Prior Living Arrangements/Services Living arrangements for the past 2 months: Skilled Nursing Facility   Patient language and need for interpreter reviewed:: Yes Do you feel safe going back to the place where you live?: Yes        Care giver support system in place?: Yes (comment)      Activities of Daily Living Home Assistive Devices/Equipment: Dan Humphreys (specify type) ADL Screening (condition at time of admission) Patient's cognitive ability adequate to safely complete daily activities?: Yes Is the patient deaf or have difficulty hearing?: No Does the patient have difficulty seeing, even when wearing glasses/contacts?: No Does the patient have difficulty concentrating, remembering, or making decisions?: No Patient able to express need for assistance with ADLs?: Yes Does the patient have difficulty dressing or bathing?: No Independently performs ADLs?: Yes (appropriate for developmental age) Does the patient have difficulty walking or  climbing stairs?: Yes Weakness of Legs: Both Weakness of Arms/Hands: Both  Permission Sought/Granted                  Emotional Assessment     Affect (typically observed): Accepting Orientation: : Fluctuating Orientation (Suspected and/or reported Sundowners)      Admission diagnosis:  Syncope and collapse [R55] Paroxysmal atrial fibrillation (HCC) [I48.0] Syncope, unspecified syncope type [R55] Patient Active Problem List   Diagnosis Date Noted   Recurrent episodes of unresponsiveness 12/10/2022   Hypoxic episodes 12/10/2022   Foley catheter in place since 11/25/22 12/10/2022   Syncope and collapse 12/10/2022   Gross hematuria 11/25/2022   Fall 11/25/2022   Sepsis secondary to UTI (HCC) 11/25/2022   Rectal bleeding 10/19/2022   Hypokalemia 10/15/2022   Insomnia 10/15/2022   Postural dizziness with presyncope 10/14/2022   AKI (acute kidney injury) (HCC) 10/14/2022   Symptomatic bradycardia 10/14/2022   PAF (paroxysmal atrial fibrillation) (HCC)    SSS (sick sinus syndrome) (HCC)    Status post reverse arthroplasty of shoulder, 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 [place] [address] Phone: [phone] Fax: [phone]     Social Determinants of Health (SDOH) Social History: SDOH Screenings   Food Insecurity: No Food Insecurity (12/10/2022)  Housing: Low Risk  (12/10/2022)  Transportation Needs: No Transportation Needs (12/10/2022)  Utilities: Not At Risk (12/10/2022)  Financial Resource Strain: Patient Declined  (02/22/2022)   Received from [facility]  Tobacco Use: Low Risk  (12/09/2022)   SDOH Interventions:     Readmission Risk Interventions     No data to display

## 2022-12-12 NOTE — Care Management Obs Status (Signed)
MEDICARE OBSERVATION STATUS NOTIFICATION   Patient Details  Name: Steven Welch MRN: [id number] Date of Birth: 1940/06/10   Medicare Observation Status Notification Given:  Yes    Masoud Nyce, LCSW 12/12/2022, 9:26 AM

## 2022-12-12 NOTE — Evaluation (Signed)
Physical Therapy Evaluation Patient Details Name: Steven Welch MRN: [id number] DOB: 1941-03-12 Today's Date: 12/12/2022  History of Present Illness  Patient is a 82 y.o. male with medical history significant for paroxysmal A-fib on Eliquis, sick sinus syndrome, hypertension, frequent falls, recently hospitalized from 8/30 to 9/7 following a fall, when he was diagnosed with sepsis secondary to Klebsiella UTI,, having a Foley placed during his stay due to urinary retention with bilateral hydronephrosis, discharged to rehab, was sent from the rehab facility for evaluation of episodic brief periods of unresponsiveness in which she would " tense up" and lose consciousness and then return to baseline shortly thereafter.  The episodes occurred while he was in bed so he sustained no falls as a result. Current MD assessment: Recurrent episodes of unresponsiveness,Acute metabolic encephalopathy, possible chronic encephalopathy of unknown etiology, hypoxic episodes, hx of frequent falls, and possible seizure.  Clinical Impression  Pt was pleasant and motivated to participate during the session and put forth good effort throughout. Pt is currently Mod A +2 for supine to sit, and Mod A for sit to supine for LE and trunk control. Once seated pt presenting with consistent posterior lean, pt able to temporarily correct with VC's. Pt able to perform STS to RW with +2 Min A, cues provided to lean forwards for STS. Orthostatics taken during each positional change: Laying; 126/67 HR61 SpO2 97%. Seated; 146/77 HR 72. Standing; 145/67 HR 116. Pt will benefit from continued PT services upon discharge to safely address deficits listed in patient problem list for decreased caregiver assistance and eventual return to PLOF.       If plan is discharge home, recommend the following: A lot of help with walking and/or transfers;Assistance with cooking/housework;Direct supervision/assist for medications management;Help with stairs or  ramp for entrance;Assist for transportation;A lot of help with bathing/dressing/bathroom   Can travel by private vehicle   No    Equipment Recommendations Other (comment) (TBD)  Recommendations for Other Services       Functional Status Assessment Patient has had a recent decline in their functional status and demonstrates the ability to make significant improvements in function in a reasonable and predictable amount of time.     Precautions / Restrictions Precautions Precautions: Fall Restrictions Weight Bearing Restrictions: No      Mobility  Bed Mobility Overal bed mobility: Needs Assistance Bed Mobility: Supine to Sit, Sit to Supine     Supine to sit: Mod assist, +2 for physical assistance Sit to supine: Mod assist   General bed mobility comments: For LE mangement and trunk control    Transfers Overall transfer level: Needs assistance Equipment used: Rolling walker (2 wheels) Transfers: Sit to/from Stand Sit to Stand: Min assist, +2 physical assistance           General transfer comment: Consistent VC's to lean forwards while sititng up and for STS trial.    Ambulation/Gait               General Gait Details: After standing BP, pt too fatigued to perform further mobility.  Stairs            Wheelchair Mobility     Tilt Bed    Modified Rankin (Stroke Patients Only)       Balance Overall balance assessment: Needs assistance Sitting-balance support: Bilateral upper extremity supported, Feet supported Sitting balance-Leahy Scale: Poor Sitting balance - Comments: Continuous posterior lean present, pt able to temporarily corrent with VC's but leans back again after 5-6 sec. Postural control:  Posterior lean Standing balance support: Bilateral upper extremity supported, During functional activity, Reliant on assistive device for balance Standing balance-Leahy Scale: Poor Standing balance comment: Able to manage standing, pt continues to  assume crouched position, but is able to correct with heavy VC's                             Pertinent Vitals/Pain Pain Assessment Pain Assessment: No/denies pain    Home Living Family/patient expects to be discharged to:: Private residence Living Arrangements: Spouse/significant other Available Help at Discharge: Family;Available PRN/intermittently Type of Home: House Home Access: Stairs to enter Entrance Stairs-Rails: Right Entrance Stairs-Number of Steps: 2   Home Layout: One level Home Equipment: Agricultural consultant (2 wheels) Additional Comments: Pt is only reporting to have RW at this time, th0ough from prior visits, seems he has a SPC and a power wheelchair as well.    Prior Function Prior Level of Function : Patient poor historian/Family not available             Mobility Comments: Pt unable to clarify ADLs Comments: Pt unable to clarify     Extremity/Trunk Assessment   Upper Extremity Assessment Upper Extremity Assessment: Generalized weakness    Lower Extremity Assessment Lower Extremity Assessment: Generalized weakness       Communication   Communication Communication: No apparent difficulties;Difficulty following commands/understanding Following commands: Follows one step commands inconsistently  Cognition Arousal: Alert Behavior During Therapy: Flat affect, WFL for tasks assessed/performed Overall Cognitive Status: No family/caregiver present to determine baseline cognitive functioning                                          General Comments      Exercises     Assessment/Plan    PT Assessment Patient needs continued PT services  PT Problem List Decreased strength;Decreased range of motion;Decreased activity tolerance;Decreased balance;Decreased mobility;Decreased safety awareness;Decreased cognition;Decreased knowledge of use of DME       PT Treatment Interventions DME instruction;Gait training;Stair  training;Functional mobility training;Therapeutic activities;Therapeutic exercise;Balance training;Patient/family education    PT Goals (Current goals can be found in the Care Plan section)  Acute Rehab PT Goals Patient Stated Goal: walk better PT Goal Formulation: With patient Time For Goal Achievement: 12/25/22 Potential to Achieve Goals: Fair    Frequency Min 1X/week     Co-evaluation               AM-PAC PT "6 Clicks" Mobility  Outcome Measure Help needed turning from your back to your side while in a flat bed without using bedrails?: A Lot Help needed moving from lying on your back to sitting on the side of a flat bed without using bedrails?: A Lot Help needed moving to and from a bed to a chair (including a wheelchair)?: A Lot Help needed standing up from a chair using your arms (e.g., wheelchair or bedside chair)?: A Little Help needed to walk in hospital room?: A Lot Help needed climbing 3-5 steps with a railing? : Total 6 Click Score: 12    End of Session Equipment Utilized During Treatment: Gait belt Activity Tolerance: Patient tolerated treatment well Patient left: in bed;with call bell/phone within reach;with bed alarm set Nurse Communication: Mobility status PT Visit Diagnosis: Muscle weakness (generalized) (M62.81);Difficulty in walking, not elsewhere classified (R26.2);History of falling (Z91.81)    Time:  6962-9528 PT Time Calculation (min) (ACUTE ONLY): 43 min   Charges:                Cecile Sheerer, SPT 12/12/22, 1:24 PM

## 2022-12-12 NOTE — Procedures (Signed)
Patient Name: Steven Welch  MRN: [id number]  Epilepsy Attending: Charlsie Quest  Referring Physician/Provider: Andris Baumann, MD  Date: 12/12/2022 Duration: 34.29 mins  Patient history: 82yo M with ams getting eeg to evaluate for seizure  Level of alertness: Awake, asleep  AEDs during EEG study: None  Technical aspects: This EEG study was done with scalp electrodes positioned according to the 10-20 International system of electrode placement. Electrical activity was reviewed with band pass filter of 1-70Hz , sensitivity of 7 uV/mm, display speed of 20mm/sec with a 60Hz  notched filter applied as appropriate. EEG data were recorded continuously and digitally stored.  Video monitoring was available and reviewed as appropriate.  Description: The posterior dominant rhythm consists of 9 Hz activity of moderate voltage (25-35 uV) seen predominantly in posterior head regions, symmetric and reactive to eye opening and eye closing. Sleep was characterized by vertex waves, sleep spindles (12 to 14 Hz), maximal frontocentral region. Hyperventilation and photic stimulation were not performed.     IMPRESSION: This study is within normal limits. No seizures or epileptiform discharges were seen throughout the recording.  A normal interictal EEG does not exclude the diagnosis of epilepsy.  Steven Welch

## 2022-12-12 NOTE — Progress Notes (Signed)
Patient transported to EEG at 1312 in stable condition in bed.  Patient returned from EEG in bed at 1446 in stable condition.

## 2022-12-12 NOTE — Evaluation (Signed)
Occupational Therapy Evaluation Patient Details Name: Steven Welch MRN: [id number] DOB: 19-Apr-1940 Today's Date: 12/12/2022   History of Present Illness Patient is a 82 y.o. male with medical history significant for paroxysmal A-fib on Eliquis, sick sinus syndrome, hypertension, frequent falls, recently hospitalized from 8/30 to 9/7 following a fall, when he was diagnosed with sepsis secondary to Klebsiella UTI,, having a Foley placed during his stay due to urinary retention with bilateral hydronephrosis, discharged to rehab, was sent from the rehab facility for evaluation of episodic brief periods of unresponsiveness in which she would " tense up" and lose consciousness and then return to baseline shortly thereafter.  The episodes occurred while he was in bed so he sustained no falls as a result. Current MD assessment: Recurrent episodes of unresponsiveness,Acute metabolic encephalopathy, possible chronic encephalopathy of unknown etiology, hypoxic episodes, hx of frequent falls, and possible seizure.   Clinical Impression   Pt was seen for OT evaluation this date. Prior to hospital admission, pt reports getting dressed and bathing without assist and using walking cane for mobility. No family present to verify PLOF/home set up and contradictions noted with prior chart review, recent PT evaluation this admission, and OT evaluation. Pt endorses feeling a bit weaker than baseline but then also reports he feels like his "normal self." Pt presents to acute OT demonstrating impaired ADL performance and functional mobility 2/2 decreased strength, cognition including safety awareness, and expected balance deficits (See OT problem list for additional functional deficits). Pt declines EOB/OOB and becomes mildly upset with efforts to encourage. Anticipate pt will require MOD A +1-2 for bed mobility, MIN A +2 for ADL transfers, and MOD for LB ADL tasks.  Pt would benefit from skilled OT services to address noted  impairments and functional limitations (see below for any additional details) in order to maximize safety and independence while minimizing falls risk and caregiver burden.      If plan is discharge home, recommend the following: Two people to help with walking and/or transfers;A lot of help with bathing/dressing/bathroom;Direct supervision/assist for medications management;Assistance with cooking/housework;Assist for transportation;Help with stairs or ramp for entrance;Supervision due to cognitive status    Functional Status Assessment  Patient has had a recent decline in their functional status and demonstrates the ability to make significant improvements in function in a reasonable and predictable amount of time.  Equipment Recommendations  Other (comment) (defer to next venue)    Recommendations for Other Services       Precautions / Restrictions Precautions Precautions: Fall Restrictions Weight Bearing Restrictions: No      Mobility Bed Mobility               General bed mobility comments: pt declined    Transfers                          Balance                                           ADL either performed or assessed with clinical judgement   ADL Overall ADL's : Needs assistance/impaired     Grooming: Set up;Bed level;Wash/dry face                                 General ADL Comments: Pt declined additional ADL  engagement. Anticipate pt requires at least MOD A for LB ADL, and MIN A for seated UB ADL.     Vision         Perception         Praxis         Pertinent Vitals/Pain Pain Assessment Pain Assessment: No/denies pain     Extremity/Trunk Assessment Upper Extremity Assessment Upper Extremity Assessment: Generalized weakness   Lower Extremity Assessment Lower Extremity Assessment: Generalized weakness       Communication Communication Communication: No apparent difficulties;Difficulty  following commands/understanding Following commands: Follows one step commands inconsistently   Cognition Arousal: Alert Behavior During Therapy: Flat affect, WFL for tasks assessed/performed Overall Cognitive Status: No family/caregiver present to determine baseline cognitive functioning                                 General Comments: alert and oriented to self, bday, year, and hospital, cues for sequencing/commands     General Comments       Exercises     Shoulder Instructions      Home Living Family/patient expects to be discharged to:: Private residence Living Arrangements: Spouse/significant other Available Help at Discharge: Family;Available PRN/intermittently Type of Home: House Home Access: Stairs to enter [employer] of Steps: 2 Entrance Stairs-Rails: Right Home Layout: One level     Bathroom Shower/Tub: Tub/shower unit (pt tells PT tub/shower, tells OT walk-in shower)   Bathroom Toilet: Standard Bathroom Accessibility: Yes   Home Equipment: Agricultural consultant (2 wheels)   Additional Comments: Pt is only reporting to have RW at this time, though from prior visits, seems he has a SPC and a power wheelchair as well. Pt tells OT during evaluation he has a walking cane      Prior Functioning/Environment Prior Level of Function : Patient poor historian/Family not available             Mobility Comments: Pt reports using a walking cane and wife was with him ADLs Comments: Pt unable to clarify        OT Problem List: Decreased strength;Decreased cognition;Decreased safety awareness;Decreased activity tolerance;Decreased knowledge of use of DME or AE      OT Treatment/Interventions: Self-care/ADL training;Therapeutic exercise;Therapeutic activities;DME and/or AE instruction;Patient/family education;Balance training    OT Goals(Current goals can be found in the care plan section) Acute Rehab OT Goals Patient Stated Goal: go home OT  Goal Formulation: With patient Time For Goal Achievement: 12/26/22 Potential to Achieve Goals: Good ADL Goals Pt Will Perform Upper Body Dressing: with set-up;with supervision;sitting Pt Will Perform Lower Body Dressing: sit to/from stand;with min assist Pt Will Transfer to Toilet: stand pivot transfer;bedside commode;with min assist (LRAD) Pt Will Perform Toileting - Clothing Manipulation and hygiene: sitting/lateral leans;with contact guard assist;with set-up  OT Frequency: Min 1X/week    Co-evaluation              AM-PAC OT "6 Clicks" Daily Activity     Outcome Measure Help from another person eating meals?: None Help from another person taking care of personal grooming?: A Little Help from another person toileting, which includes using toliet, bedpan, or urinal?: A Lot Help from another person bathing (including washing, rinsing, drying)?: A Lot Help from another person to put on and taking off regular upper body clothing?: A Lot Help from another person to put on and taking off regular lower body clothing?: A Lot 6 Click Score: 15  End of Session    Activity Tolerance: Patient tolerated treatment well Patient left: in bed;with call bell/phone within reach;with bed alarm set  OT Visit Diagnosis: Other abnormalities of gait and mobility (R26.89);Muscle weakness (generalized) (M62.81)                Time: 2536-6440 OT Time Calculation (min): 17 min Charges:  OT General Charges $OT Visit: 1 Visit OT Evaluation $OT Eval Moderate Complexity: 1 Mod  Arman Filter., MPH, MS, OTR/L ascom [phone] 12/12/22, 5:06 PM

## 2022-12-12 NOTE — Progress Notes (Signed)
PROGRESS NOTE    Steven Welch  ZOX:[id number] DOB: [birth year] DOA: 12/10/2022 PCP: Dorothey Baseman, MD  106A/106A-AA  LOS: 0 days   Brief hospital course:   Assessment & Plan: Steven Welch is a 82 y.o. male with medical history significant for paroxysmal A-fib on Eliquis, sick sinus syndrome, hypertension, frequent falls, recently hospitalized from 8/30 to 9/7 following a fall, when he was diagnosed with sepsis secondary to Klebsiella UTI,, having a Foley placed during his stay due to urinary retention with bilateral hydronephrosis, discharged to rehab, was sent from the rehab facility for evaluation of episodic brief periods of unresponsiveness in which she would " tense up" and lose consciousness and then return to baseline shortly thereafter.  The episodes occurred while he was in bed so he sustained no falls as a result.  After arrival in the emergency room he had a couple witnessed episodes, no seizure activity reported.  During the episodes he was reportedly " in and out" of A-fib on the monitor and had significant O2 desaturations to the 50s.    * Recurrent episodes of unresponsiveness Acute metabolic encephalopathy, possible chronic encephalopathy of unknown etiology Possible seizure History of frequent falls CT head and MRI no acute finding --no witnessed event since presentation --orthostatic BP done today, neg --EEG today --cont tele --[facility] cardio to place long-term cardiac monitor prior to discharge  Hypoxic episodes Patient having severe desaturations reportedly to the 50s during periods of unresponsiveness Uncertain etiology. On Eliquis so low suspicion for PE CXR no acute finding.  SSS (sick sinus syndrome) (HCC) History of symptomatic bradycardia Patient might need loop recorder to evaluate for concordance between episodes and heart rate/rhythm Continuous cardiac monitoring --hold beta blocker --KC cardio to place long-term cardiac monitor prior to discharge  PAF  (paroxysmal atrial fibrillation) (HCC) Continue Eliquis Will hold any rate control agents due to possibility of symptomatic bradycardia  AKI (acute kidney injury) (HCC), ruled out --Cr 2.06 on presentation, was 2.31 a week ago.  Foley catheter in place since 11/25/22 History of acute urinary retention with bilateral hydronephrosis requiring Foley placement 11/25/2022 History of Klebsiella UTI 11/25/2022 Patient due for urology follow-up 9/26 Urinalysis not consistent with UTI   DVT prophylaxis: WJ:[id number] Code Status: Full code  Family Communication:  Level of care: Med-Surg Dispo:   The patient is from: SNF rehab Anticipated d/c is to: SNF rehab Anticipated d/c date is: whenever bed available   Subjective and Interval History:  Pt reported not eating well.  Reported feeling a little when PT got him up, thought orthostatic neg.   Objective: Vitals:   12/12/22 0754 12/12/22 1115 12/12/22 1148 12/12/22 1559  BP: 139/76  127/70 126/76  Pulse: (!) 58  64 (!) 58  Resp: 17  18 16   Temp: 97.7 F (36.5 C)  97.9 F (36.6 C) 97.9 F (36.6 C)  TempSrc:    Oral  SpO2: 98% 97% 100% 98%  Weight:      Height:        Intake/Output Summary (Last 24 hours) at 12/12/2022 1949 Last data filed at 12/12/2022 0502 Gross per 24 hour  Intake --  Output 750 ml  Net -750 ml   Filed Weights   12/10/22 1111 12/11/22 0500 12/12/22 0500  Weight: 87.8 kg 86.9 kg 87.2 kg    Examination:   Constitutional: NAD, alert HEENT: conjunctivae and lids normal, EOMI CV: No cyanosis.   RESP: normal respiratory effort, on RA Extremities: No effusions, edema in BLE SKIN: warm, dry Neuro:  II - XII grossly intact.   Psych: Normal mood and affect.     Data Reviewed: I have personally reviewed labs and imaging studies  Time spent: 35 minutes  Darlin Priestly, MD [facility] If 7PM-7AM, please contact night-coverage 12/12/2022, 7:49 PM

## 2022-12-12 NOTE — Plan of Care (Signed)
Problem: Clinical Measurements: Goal: Diagnostic test results will improve Outcome: Progressing Goal: Respiratory complications will improve Outcome: Progressing   Problem: Pain Managment: Goal: General experience of comfort will improve Outcome: Progressing   Problem: Safety: Goal: Ability to remain free from injury will improve Outcome: Progressing   Problem: Skin Integrity: Goal: Risk for impaired skin integrity will decrease Outcome: Progressing

## 2022-12-12 NOTE — TOC Progression Note (Signed)
Transition of Care [facility]) - Progression Note    Patient Details  Name: Osirus Bardin MRN: [id number] Date of Birth: 1940/04/10  [facility]) CM/SW Contact  Allena Katz, LCSW Phone Number: 12/12/2022, 3:10 PM  Clinical Narrative:   Berkley Harvey started for [facility].     Expected Discharge Plan: Skilled Nursing Facility Barriers to Discharge: Continued Medical Work up  Expected Discharge Plan and Services       Living arrangements for the past 2 months: Skilled Nursing Facility                                       Social Determinants of Health (SDOH) Interventions SDOH Screenings   Food Insecurity: No Food Insecurity (12/10/2022)  Housing: Low Risk  (12/10/2022)  Transportation Needs: No Transportation Needs (12/10/2022)  Utilities: Not At Risk (12/10/2022)  Financial Resource Strain: Patient Declined (02/22/2022)   Received from [facility], [facility]  Tobacco Use: Low Risk  (12/09/2022)    Readmission Risk Interventions     No data to display

## 2022-12-13 DIAGNOSIS — R404 Transient alteration of awareness: Secondary | ICD-10-CM | POA: Diagnosis not present

## 2022-12-13 LAB — GLUCOSE, CAPILLARY: Glucose-Capillary: 84 mg/dL (ref 70–99)

## 2022-12-13 NOTE — Progress Notes (Signed)
Pt refused mobility throughout the day and would not let care team place a sacral foam pad or inspect area.Pt also refused to change position and insisted to lay on his back all day against recommendations.

## 2022-12-13 NOTE — TOC Progression Note (Signed)
Transition of Care [facility]) - Progression Note    Patient Details  Name: Steven Welch MRN: [id number] Date of Birth: 1941-02-13  [facility]) CM/SW Contact  Allena Katz, LCSW Phone Number: 12/13/2022, 10:18 AM  Clinical Narrative:   Auth approved for [facility] for 9/17-9/19. Tanya with [facility] does not have a bed today but will let me know when she does.     Expected Discharge Plan: Skilled Nursing Facility Barriers to Discharge: Continued Medical Work up  Expected Discharge Plan and Services       Living arrangements for the past 2 months: Skilled Nursing Facility                                       Social Determinants of Health (SDOH) Interventions SDOH Screenings   Food Insecurity: No Food Insecurity (12/10/2022)  Housing: Low Risk  (12/10/2022)  Transportation Needs: No Transportation Needs (12/10/2022)  Utilities: Not At Risk (12/10/2022)  Financial Resource Strain: Patient Declined (02/22/2022)   Received from [facility], [facility]  Tobacco Use: Low Risk  (12/09/2022)    Readmission Risk Interventions     No data to display

## 2022-12-13 NOTE — Progress Notes (Signed)
PROGRESS NOTE    Steven Welch  BJY:[id number] DOB: 24-Oct-1940 DOA: 12/10/2022 PCP: Dorothey Baseman, MD  106A/[id number]-AA  LOS: 0 days   Brief hospital course:   Assessment & Plan: Steven Welch is a 82 y.o. male with medical history significant for paroxysmal A-fib on Eliquis, sick sinus syndrome, hypertension, frequent falls, recently hospitalized from 8/30 to 9/7 following a fall, when he was diagnosed with sepsis secondary to Klebsiella UTI,, having a Foley placed during his stay due to urinary retention with bilateral hydronephrosis, discharged to rehab, was sent from the rehab facility for evaluation of episodic brief periods of unresponsiveness in which she would " tense up" and lose consciousness and then return to baseline shortly thereafter.  The episodes occurred while he was in bed so he sustained no falls as a result.  After arrival in the emergency room he had a couple witnessed episodes, no seizure activity reported.  During the episodes he was reportedly " in and out" of A-fib on the monitor and had significant O2 desaturations to the 50s.    * Recurrent episodes of unresponsiveness Acute metabolic encephalopathy, possible chronic encephalopathy of unknown etiology Possible seizure History of frequent falls CT head and MRI no acute finding --no witnessed event since presentation --orthostatic BP done on 9/16, neg --EEG neg --cont tele --[facility] cardio to place long-term cardiac monitor today  Hypoxic episodes Patient having severe desaturations reportedly to the 50s during periods of unresponsiveness Uncertain etiology. On Eliquis so low suspicion for PE CXR no acute finding.  SSS (sick sinus syndrome) (HCC) History of symptomatic bradycardia Patient might need loop recorder to evaluate for concordance between episodes and heart rate/rhythm Continuous cardiac monitoring --hold beta blocker --[facility] cardio to place long-term cardiac monitor today  PAF (paroxysmal atrial  fibrillation) (HCC) Continue Eliquis Will hold any rate control agents due to possibility of symptomatic bradycardia  AKI (acute kidney injury) (HCC), ruled out --Cr 2.06 on presentation, was 2.31 a week ago.  Foley catheter in place since 11/25/22 History of acute urinary retention with bilateral hydronephrosis requiring Foley placement 11/25/2022 History of Klebsiella UTI 11/25/2022 Patient due for urology follow-up 9/26 Urinalysis not consistent with UTI   DVT prophylaxis: [id number] Code Status: Full code  Family Communication:  Level of care: Med-Surg Dispo:   The patient is from: SNF rehab Anticipated d/c is to: SNF rehab Anticipated d/c date is: likely tomorrow   Subjective and Interval History:  No new complaint today.   Objective: Vitals:   12/13/22 0030 12/13/22 0533 12/13/22 0831 12/13/22 1147  BP: 117/72 126/78 133/89 126/68  Pulse: (!) 55 (!) 53 (!) 51 (!) 58  Resp: 18 18 16 15   Temp: 98.5 F (36.9 C) 97.8 F (36.6 C) (!) 97.5 F (36.4 C) 98.1 F (36.7 C)  TempSrc: Oral Oral    SpO2: 97% 100% 98% 98%  Weight:  87.7 kg    Height:        Intake/Output Summary (Last 24 hours) at 12/13/2022 2035 Last data filed at 12/13/2022 1553 Gross per 24 hour  Intake --  Output 725 ml  Net -725 ml   Filed Weights   12/11/22 0500 12/12/22 0500 12/13/22 0533  Weight: 86.9 kg 87.2 kg 87.7 kg    Examination:   Constitutional: NAD, alert HEENT: conjunctivae and lids normal, EOMI CV: No cyanosis.   RESP: normal respiratory effort, on RA Neuro: II - XII grossly intact.     Data Reviewed: I have personally reviewed labs and imaging studies  Time spent: 25 minutes  Darlin Priestly, MD [facility] If 7PM-7AM, please contact night-coverage 12/13/2022, 8:35 PM

## 2022-12-14 DIAGNOSIS — R404 Transient alteration of awareness: Secondary | ICD-10-CM | POA: Diagnosis not present

## 2022-12-14 LAB — GLUCOSE, CAPILLARY: Glucose-Capillary: 102 mg/dL — ABNORMAL HIGH (ref 70–99)

## 2022-12-14 NOTE — Discharge Summary (Signed)
Physician Discharge Summary   Steven Welch  male DOB: 1941/03/08  ZOX:[id number]  PCP: Dorothey Baseman, MD  Admit date: 12/10/2022 Discharge date: 12/14/2022  Admitted From: SNF rehab Disposition:  SNF rehab Daughter updated on the phone prior to discharge.  CODE STATUS: Full code  Discharge Instructions     No wound care   Complete by: As directed    No wound care   Complete by: As directed       Hospital Course:  For full details, please see H&P, progress notes, consult notes and ancillary notes.  Briefly,  Steven Welch is a 82 y.o. male with medical history significant for paroxysmal A-fib on Eliquis, sick sinus syndrome, hypertension, frequent falls, recently hospitalized from 8/30 to 9/7 following a fall, when he was diagnosed with sepsis secondary to Klebsiella UTI, had a Foley placed during his stay due to urinary retention with bilateral hydronephrosis, discharged to rehab, was sent from the rehab facility for evaluation of episodic brief periods of unresponsiveness in which pt would " tense up" and lose consciousness and then return to baseline shortly thereafter.    After arrival in the emergency room he had a couple witnessed episodes, no seizure activity reported.     * Recurrent episodes of unresponsiveness Acute metabolic encephalopathy, ruled out History of frequent falls CT head and MRI no acute finding --no witnessed event since admission.  Daughter reported episodes correlated with pt getting up, so may be due to weakness from new COVID infection in the setting of deconditioning. --orthostatic BP done on 9/16, neg --EEG unremarkable. --cont tele showed sinus brady. --[facility] placed long-term cardiac monitor prior to discharge.   Hypoxic episodes Patient having desaturations reportedly to the 50s during periods of unresponsiveness Uncertain etiology.  None since admission. On Eliquis so low suspicion for PE CXR no acute finding.   SSS (sick sinus  syndrome) (HCC) --home Toprol held and HR remained in 40's-50's, though BP stable. --[facility] placed long-term cardiac monitor prior to discharge.   PAF (paroxysmal atrial fibrillation) (HCC) Continue Eliquis --Home Toprol d/c'ed since HR in 40's-50's without it.   AKI (acute kidney injury) (HCC) ruled in --Cr 1.78 on presentation.  Received IVF.  Improved to 1.43 prior to discharge.   Foley catheter in place since 11/25/22 History of acute urinary retention with bilateral hydronephrosis requiring Foley placement 11/25/2022 History of Klebsiella UTI 11/25/2022 Patient due for urology follow-up 9/26 Urinalysis on presentation not consistent with UTI  Pressure Injury 12/10/22 Coccyx Bilateral Stage 2     Discharge Diagnoses:  Principal Problem:   Recurrent episodes of unresponsiveness Active Problems:   Hypoxic episodes   SSS (sick sinus syndrome) (HCC)   PAF (paroxysmal atrial fibrillation) (HCC)   AKI (acute kidney injury) (HCC)   Hypertension   Foley catheter in place since 11/25/22   Syncope and collapse   30 Day Unplanned Readmission Risk Score    Flowsheet Row ED to Hosp-Admission (Discharged) from 11/25/2022 in [facility] (1A)  30 Day Unplanned Readmission Risk Score (%) 21.49 Filed at 12/03/2022 1600       This score is the patient's risk of an unplanned readmission within 30 days of being discharged (0 -100%). The score is based on dignosis, age, lab data, medications, orders, and past utilization.   Low:  0-14.9   Medium: 15-21.9   High: 22-29.9   Extreme: 30 and above         Discharge Instructions:  Allergies as of  12/14/2022   No Known Allergies      Medication List     STOP taking these medications    metoprolol succinate 25 MG 24 hr tablet Commonly known as: TOPROL-XL       TAKE these medications    acetaminophen 500 MG tablet Commonly known as: TYLENOL Take 2 tablets (1,000 mg total) by mouth every 6  (six) hours as needed (pain).   apixaban 2.5 MG Tabs tablet Commonly known as: ELIQUIS Take 1 tablet (2.5 mg total) by mouth 2 (two) times daily.   feeding supplement Liqd Take 237 mLs by mouth 3 (three) times daily between meals.   Icy Hot Original Pain Relief 2.5 % Gel Generic drug: Menthol (Topical Analgesic) Apply topically as needed.   lidocaine 5 % Commonly known as: Lidoderm Place 1 patch onto the skin every 12 (twelve) hours. Remove & Discard patch within 12 hours or as directed by MD   melatonin 3 MG Tabs tablet Take 6 mg by mouth at bedtime.   nystatin powder Commonly known as: MYCOSTATIN/NYSTOP Apply topically 3 (three) times daily.   ondansetron 4 MG tablet Commonly known as: ZOFRAN Take 1 tablet (4 mg total) by mouth every 6 (six) hours as needed for nausea.   pantoprazole 40 MG tablet Commonly known as: PROTONIX Take 1 tablet (40 mg total) by mouth 2 (two) times daily.   tamsulosin 0.4 MG Caps capsule Commonly known as: FLOMAX Take 1 capsule (0.4 mg total) by mouth daily.         Follow-up Information     Dorothey Baseman, MD Follow up.   Specialty: Family Medicine Contact information: [address] [place] [address] [phone]                 No Known Allergies   The results of significant diagnostics from this hospitalization (including imaging, microbiology, ancillary and laboratory) are listed below for reference.   Consultations:   Procedures/Studies: EEG adult  Result Date: 26-Dec-2022 Charlsie Quest, MD     2022-12-26  3:56 PM Patient Name: Steven Welch MRN: [id number] Epilepsy Attending: Charlsie Quest Referring Physician/Provider: Andris Baumann, MD Date: 2022-12-26 Duration: 34.29 mins Patient history: 82yo M with ams getting eeg to evaluate for seizure Level of alertness: Awake, asleep AEDs during EEG study: None Technical aspects: This EEG study was done with scalp electrodes positioned according to the 10-20  International system of electrode placement. Electrical activity was reviewed with band pass filter of 1-70Hz , sensitivity of 7 uV/mm, display speed of 20mm/sec with a 60Hz  notched filter applied as appropriate. EEG data were recorded continuously and digitally stored.  Video monitoring was available and reviewed as appropriate. Description: The posterior dominant rhythm consists of 9 Hz activity of moderate voltage (25-35 uV) seen predominantly in posterior head regions, symmetric and reactive to eye opening and eye closing. Sleep was characterized by vertex waves, sleep spindles (12 to 14 Hz), maximal frontocentral region. Hyperventilation and photic stimulation were not performed.   IMPRESSION: This study is within normal limits. No seizures or epileptiform discharges were seen throughout the recording. A normal interictal EEG does not exclude the diagnosis of epilepsy. Charlsie Quest   MR BRAIN WO CONTRAST  Result Date: 12/10/2022 CLINICAL DATA:  Weakness.  Confusion.  History of recent COVID EXAM: MRI HEAD WITHOUT CONTRAST TECHNIQUE: Multiplanar, multiecho pulse sequences of the brain and surrounding structures were obtained without intravenous contrast. COMPARISON:  Head CT from earlier today.  04/04/2021 brain MRI  FINDINGS: Brain: Advanced brain atrophy especially affecting the anterior temporal lobes. Ventriculomegaly, disproportionate subarachnoid spaces but degree of ventricular enlargement is congruent with volume loss. Confluent chronic small vessel ischemia in the cerebral white matter. No acute infarct, hemorrhage, mass, or abnormal mineralization Vascular: Chronic loss of right vertebral flow void the distal V4 segment Skull and upper cervical spine: Normal marrow signal Sinuses/Orbits: Negative IMPRESSION: 1. No acute or reversible finding. 2. Advanced atrophy, especially of the temporal lobes, with extensive chronic small vessel ischemia. Electronically Signed   By: Tiburcio Pea M.D.   On:  12/10/2022 07:25   DG Chest Port 1 View  Result Date: 12/10/2022 CLINICAL DATA:  Weakness with episode of unresponsiveness EXAM: PORTABLE CHEST 1 VIEW COMPARISON:  10/13/2022 FINDINGS: Chronic interstitial coarsening. There is no edema, consolidation, effusion, or pneumothorax. Normal heart size and mediastinal contours for technique. 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 IMPRESSION: Low volume chest without acute or focal finding. Electronically Signed   By: Tiburcio Pea M.D.   On: 12/10/2022 05:18   CT Head Wo Contrast  Result Date: 12/10/2022 CLINICAL DATA:  Syncope, weakness, COVID EXAM: CT HEAD WITHOUT CONTRAST TECHNIQUE: Contiguous axial images were obtained from the base of the skull through the vertex without intravenous contrast. RADIATION DOSE REDUCTION: This exam was performed according to the departmental dose-optimization program which includes automated exposure control, adjustment of the mA and/or kV according to patient size and/or use of iterative reconstruction technique. COMPARISON:  11/25/2022 FINDINGS: Brain: No evidence of acute infarction, hemorrhage, hydrocephalus, extra-axial collection or mass lesion/mass effect. Global cortical and central atrophy, temporal lobe predominant. Secondary ventricular prominence. Extensive subcortical white matter and periventricular small vessel ischemic changes. Vascular: No hyperdense vessel or unexpected calcification. Skull: Normal. Negative for fracture or focal lesion. Sinuses/Orbits: The visualized paranasal sinuses are essentially clear. The mastoid air cells are unopacified. Other: None. Electronically Signed   By: Charline Bills M.D.   On: 12/10/2022 01:39   CT ABDOMEN PELVIS WO CONTRAST  Result Date: 11/25/2022 CLINICAL DATA:  Kidney failure, acute hematuria EXAM: CT ABDOMEN AND PELVIS WITHOUT CONTRAST TECHNIQUE: Multidetector CT imaging of the abdomen and pelvis was performed following the standard  protocol without IV contrast. RADIATION DOSE REDUCTION: This exam was performed according to the departmental dose-optimization program which includes automated exposure control, adjustment of the mA and/or kV according to patient size and/or use of iterative reconstruction technique. COMPARISON:  Renal ultrasound 11/25/2022 FINDINGS: Lower chest: Small right pleural effusion. There is pleural thickening about the right pleural effusion. Trace left pleural effusion. Bibasilar airspace opacities. Findings are similar to CT chest 04/09/2021. Hepatobiliary: Unremarkable noncontrast appearance of the liver, gallbladder, and biliary tree. Pancreas: Unremarkable. Spleen: Unremarkable. Adrenals/Urinary Tract: Normal adrenal glands. Moderate right and mild left hydroureteronephrosis. No urinary calculi. Nonspecific perinephric stranding bilaterally. Foley catheter within the bladder which is poorly evaluated due to streak artifact. Question wall thickening and perivesical stranding. There are some locules of gas either within the bladder lumen or wall. Stomach/Bowel: Normal caliber large and small bowel. Colonic diverticulosis without diverticulitis. The appendix is not definitively visualized. Stomach is within normal limits other than a small hiatal hernia. Vascular/Lymphatic: Aortic atherosclerosis. No enlarged abdominal or pelvic lymph nodes. Reproductive: Not well evaluated due to streak artifact. No evidence of adnexal mass. Other: Small volume free fluid in the pelvis. No free intraperitoneal air. Musculoskeletal: No acute fracture.  Bilateral THA is. IMPRESSION: 1. Moderate right and mild left hydroureteronephrosis. No urinary calculi. 2. Foley catheter within  the bladder. The bladder is not well evaluated due to streak artifact. There may be some bladder wall thickening and perivesical stranding. Locules of gas either within the bladder lumen or wall. Correlate for cystitis. 3. Small right pleural effusion with  pleural thickening similar to CT chest 04/09/2021. Aortic Atherosclerosis (ICD10-I70.0). Electronically Signed   By: Minerva Fester M.D.   On: 11/25/2022 22:47   US Renal  Result Date: 11/25/2022 CLINICAL DATA:  Acute kidney injury EXAM: RENAL / URINARY TRACT ULTRASOUND COMPLETE COMPARISON:  None Available. FINDINGS: Right Kidney: Renal measurements: 11.7 x 5.7 x 6.7 cm = volume: 233 mL. Mild right hydronephrosis. No mass. Normal echotexture. Left Kidney: Renal measurements: 11.5 x 5.8 x 5.3 cm = volume: 183 mL. Mild hydronephrosis. No mass. Normal echotexture. Bladder: Partially decompressed with Foley catheter in in place. Layering debris. Other: None. IMPRESSION: Mild bilateral hydronephrosis. Layering debris within the urinary bladder. Electronically Signed   By: Charlett Nose M.D.   On: 11/25/2022 20:34   CT Head Wo Contrast  Result Date: 11/25/2022 CLINICAL DATA:  Fall, head injury EXAM: CT HEAD WITHOUT CONTRAST CT CERVICAL SPINE WITHOUT CONTRAST TECHNIQUE: Multidetector CT imaging of the head and cervical spine was performed following the standard protocol without intravenous contrast. Multiplanar CT image reconstructions of the cervical spine were also generated. RADIATION DOSE REDUCTION: This exam was performed according to the departmental dose-optimization program which includes automated exposure control, adjustment of the mA and/or kV according to patient size and/or use of iterative reconstruction technique. COMPARISON:  CT head dated 08/17/2022. CT cervical spine dated 03/08/2022. FINDINGS: CT HEAD FINDINGS Brain: No evidence of acute infarction, hemorrhage, hydrocephalus, extra-axial collection or mass lesion/mass effect. Global cortical and central atrophy, temporal lobe predominant. Extensive subcortical white matter and periventricular small vessel ischemic changes. Vascular: No hyperdense vessel or unexpected calcification. Skull: Normal. Negative for fracture or focal lesion.  Sinuses/Orbits: The visualized paranasal sinuses are essentially clear. The mastoid air cells are unopacified. Other: None. CT CERVICAL SPINE FINDINGS Alignment: Normal cervical lordosis. Skull base and vertebrae: No acute fracture. No primary bone lesion or focal pathologic process. Soft tissues and spinal canal: No prevertebral fluid or swelling. No visible canal hematoma. Disc levels: Mild degenerative changes of the mid cervical spine. Spinal canal is patent. Upper chest: Visualized lung apices are clear. Other: Visualized thyroid is unremarkable. IMPRESSION: No acute intracranial abnormality. Atrophy with small vessel ischemic changes. No traumatic injury to the cervical spine. Mild degenerative changes. Electronically Signed   By: Charline Bills M.D.   On: 11/25/2022 16:20   CT CERVICAL SPINE WO CONTRAST  Result Date: 11/25/2022 CLINICAL DATA:  Fall, head injury EXAM: CT HEAD WITHOUT CONTRAST CT CERVICAL SPINE WITHOUT CONTRAST TECHNIQUE: Multidetector CT imaging of the head and cervical spine was performed following the standard protocol without intravenous contrast. Multiplanar CT image reconstructions of the cervical spine were also generated. RADIATION DOSE REDUCTION: This exam was performed according to the departmental dose-optimization program which includes automated exposure control, adjustment of the mA and/or kV according to patient size and/or use of iterative reconstruction technique. COMPARISON:  CT head dated 08/17/2022. CT cervical spine dated 03/08/2022. FINDINGS: CT HEAD FINDINGS Brain: No evidence of acute infarction, hemorrhage, hydrocephalus, extra-axial collection or mass lesion/mass effect. Global cortical and central atrophy, temporal lobe predominant. Extensive subcortical white matter and periventricular small vessel ischemic changes. Vascular: No hyperdense vessel or unexpected calcification. Skull: Normal. Negative for fracture or focal lesion. Sinuses/Orbits: The visualized  paranasal sinuses are essentially clear. The  mastoid air cells are unopacified. Other: None. CT CERVICAL SPINE FINDINGS Alignment: Normal cervical lordosis. Skull base and vertebrae: No acute fracture. No primary bone lesion or focal pathologic process. Soft tissues and spinal canal: No prevertebral fluid or swelling. No visible canal hematoma. Disc levels: Mild degenerative changes of the mid cervical spine. Spinal canal is patent. Upper chest: Visualized lung apices are clear. Other: Visualized thyroid is unremarkable. IMPRESSION: No acute intracranial abnormality. Atrophy with small vessel ischemic changes. No traumatic injury to the cervical spine. Mild degenerative changes. Electronically Signed   By: Charline Bills M.D.   On: 11/25/2022 16:20      Labs: BNP (last 3 results) No results for input(s): "BNP" in the last 8760 hours. Basic Metabolic Panel: Recent Labs  Lab 12/09/22 1808 12/11/22 0430 12/12/22 0302 12/14/22 0428  NA 135 137 139 140  K 3.8 3.2* 3.2* 3.7  CL 97* 105 106 107  CO2 24 24 23 26   GLUCOSE 123* 81 86 94  BUN 29* 32* 30* 28*  CREATININE 2.06* 1.78* 1.61* 1.43*  CALCIUM 9.0 8.4* 8.3* 8.7*  MG  --  1.7 1.9 1.8   Liver Function Tests: No results for input(s): "AST", "ALT", "ALKPHOS", "BILITOT", "PROT", "ALBUMIN" in the last 168 hours. No results for input(s): "LIPASE", "AMYLASE" in the last 168 hours. No results for input(s): "AMMONIA" in the last 168 hours. CBC: Recent Labs  Lab 12/09/22 1808 12/11/22 0430 12/12/22 0302 12/14/22 0428  WBC 8.7 4.9 5.1 6.2  HGB 12.6* 10.3* 10.4* 10.7*  HCT 38.9* 31.2* 31.8* 31.6*  MCV 93.5 91.5 92.7 90.3  PLT 339 257 263 259   Cardiac Enzymes: No results for input(s): "CKTOTAL", "CKMB", "CKMBINDEX", "TROPONINI" in the last 168 hours. BNP: Invalid input(s): "POCBNP" CBG: Recent Labs  Lab 12/10/22 0644 12/11/22 0631 12/12/22 0606 12/13/22 0557 12/14/22 0511  GLUCAP 87 77 81 84 102*   D-Dimer No results for  input(s): "DDIMER" in the last 72 hours. Hgb A1c No results for input(s): "HGBA1C" in the last 72 hours. Lipid Profile No results for input(s): "CHOL", "HDL", "LDLCALC", "TRIG", "CHOLHDL", "LDLDIRECT" in the last 72 hours. Thyroid function studies No results for input(s): "TSH", "T4TOTAL", "T3FREE", "THYROIDAB" in the last 72 hours.  Invalid input(s): "FREET3" Anemia work up No results for input(s): "VITAMINB12", "FOLATE", "FERRITIN", "TIBC", "IRON", "RETICCTPCT" in the last 72 hours. Urinalysis    Component Value Date/Time   COLORURINE YELLOW (A) 12/10/2022 0136   APPEARANCEUR CLOUDY (A) 12/10/2022 0136   APPEARANCEUR Clear 02/12/2014 1357   LABSPEC 1.013 12/10/2022 0136   LABSPEC 1.018 02/12/2014 1357   PHURINE 5.0 12/10/2022 0136   GLUCOSEU NEGATIVE 12/10/2022 0136   GLUCOSEU Negative 02/12/2014 1357   HGBUR LARGE (A) 12/10/2022 0136   BILIRUBINUR NEGATIVE 12/10/2022 0136   BILIRUBINUR Negative 02/12/2014 1357   KETONESUR NEGATIVE 12/10/2022 0136   PROTEINUR 30 (A) 12/10/2022 0136   NITRITE NEGATIVE 12/10/2022 0136   LEUKOCYTESUR NEGATIVE 12/10/2022 0136   LEUKOCYTESUR Negative 02/12/2014 1357   Sepsis Labs Recent Labs  Lab 12/09/22 1808 12/11/22 0430 12/12/22 0302 12/14/22 0428  WBC 8.7 4.9 5.1 6.2   Microbiology Recent Results (from the past 240 hour(s))  SARS Coronavirus 2 by RT PCR (hospital order, performed in [facility] hospital lab) *cepheid single result test* Anterior Nasal Swab     Status: Abnormal   Collection Time: 12/10/22 11:47 AM   Specimen: Anterior Nasal Swab  Result Value Ref Range Status   SARS Coronavirus 2 by RT PCR POSITIVE (A)  NEGATIVE Final    Comment: (NOTE) SARS-CoV-2 target nucleic acids are DETECTED  SARS-CoV-2 RNA is generally detectable in upper respiratory specimens  during the acute phase of infection.  Positive results are indicative  of the presence of the identified virus, but do not rule out bacterial infection or  co-infection with other pathogens not detected by the test.  Clinical correlation with patient history and  other diagnostic information is necessary to determine patient infection status.  The expected result is negative.  Fact Sheet for Patients:   RoadLapTop.co.za   Fact Sheet for Healthcare Providers:   http://kim-miller.com/    This test is not yet approved or cleared by the [place] FDA and  has been authorized for detection and/or diagnosis of SARS-CoV-2 by FDA under an Emergency Use Authorization (EUA).  This EUA will remain in effect (meaning this test can be used) for the duration of  the COVID-19 declaration under Section 564(b)(1)  of the Act, 21 U.S.C. section 360-bbb-3(b)(1), unless the authorization is terminated or revoked sooner.   Performed at [facility], [address] [place], [place] [address]      Total time spend on discharging this patient, including the last patient exam, discussing the hospital stay, instructions for ongoing care as it relates to all pertinent caregivers, as well as preparing the medical discharge records, prescriptions, and/or referrals as applicable, is 35 minutes.    Darlin Priestly, MD  [facility] 12/14/2022, 9:36 AM

## 2022-12-14 NOTE — Progress Notes (Addendum)
Patient is alert and oriented X 2 With confusion. Patient report given to assigned LPN crystal.all questions are addressed via phone.pt is going to facility with foley's catheter.

## 2022-12-14 NOTE — TOC Transition Note (Signed)
Transition of Care [facility]) - CM/SW Discharge Note   Patient Details  Name: Luc Vedder MRN: [id number] Date of Birth: [birth year]  [facility] CM/SW Contact:  Allena Katz, LCSW Phone Number: 12/14/2022, 9:31 AM   Clinical Narrative:   Pt has orders to discharge back to [facility]. RN given number for report. DC summary to be sent once in. Medical Necessity printed to the unit. CSW to call ems once dc summary is in. Wife notified pt was being moved.      Final next level of care: Skilled Nursing Facility Barriers to Discharge: Continued Medical Work up   Patient Goals and CMS Choice CMS Medicare.gov Compare Post Acute Care list provided to:: Patient Represenative (must comment) (WIFE)    Discharge Placement                Patient chooses bed at: [facility] Patient to be transferred to facility by: [facility] Name of family member notified: ki spouse Patient and family notified of of transfer: 12/14/22  Discharge Plan and Services Additional resources added to the After Visit Summary for                                       Social Determinants of Health (SDOH) Interventions SDOH Screenings   Food Insecurity: No Food Insecurity (12/10/2022)  Housing: Low Risk  (12/10/2022)  Transportation Needs: No Transportation Needs (12/10/2022)  Utilities: Not At Risk (12/10/2022)  Financial Resource Strain: Patient Declined (02/22/2022)   [facility], [facility]  Tobacco Use: Low Risk  (12/09/2022)     Readmission Risk Interventions     No data to display

## 2022-12-14 NOTE — Plan of Care (Signed)
Problem: Education: Goal: Knowledge of condition and prescribed therapy will improve Outcome: Completed/Met   Problem: Cardiac: Goal: Will achieve and/or maintain adequate cardiac output Outcome: Completed/Met   Problem: Physical Regulation: Goal: Complications related to the disease process, condition or treatment will be avoided or minimized Outcome: Completed/Met   Problem: Education: Goal: Knowledge of General Education information will improve Description: Including pain rating scale, medication(s)/side effects and non-pharmacologic comfort measures Outcome: Completed/Met   Problem: Health Behavior/Discharge Planning: Goal: Ability to manage health-related needs will improve Outcome: Completed/Met   Problem: Clinical Measurements: Goal: Ability to maintain clinical measurements within normal limits will improve Outcome: Completed/Met Goal: Will remain free from infection Outcome: Completed/Met Goal: Diagnostic test results will improve Outcome: Completed/Met Goal: Respiratory complications will improve Outcome: Completed/Met Goal: Cardiovascular complication will be avoided Outcome: Completed/Met   Problem: Activity: Goal: Risk for activity intolerance will decrease Outcome: Completed/Met   Problem: Nutrition: Goal: Adequate nutrition will be maintained Outcome: Completed/Met   Problem: Coping: Goal: Level of anxiety will decrease Outcome: Completed/Met   Problem: Elimination: Goal: Will not experience complications related to bowel motility Outcome: Completed/Met Goal: Will not experience complications related to urinary retention Outcome: Completed/Met   Problem: Pain Managment: Goal: General experience of comfort will improve Outcome: Completed/Met   Problem: Safety: Goal: Ability to remain free from injury will improve Outcome: Completed/Met   Problem: Skin Integrity: Goal: Risk for impaired skin integrity will decrease Outcome: Completed/Met

## 2022-12-22 ENCOUNTER — Ambulatory Visit: Payer: Self-pay | Admitting: Physician Assistant

## 2022-12-22 ENCOUNTER — Ambulatory Visit: Payer: Self-pay | Admitting: Urology

## 2023-01-25 ENCOUNTER — Ambulatory Visit: Payer: Medicare Other | Admitting: Dermatology

## 2023-01-27 DEATH — deceased
